# Patient Record
Sex: Female | Born: 1935 | Race: White | Hispanic: No | Marital: Married
Health system: Midwestern US, Community
[De-identification: ages and names within clinical notes are randomized; demographics above are authoritative.]

## PROBLEM LIST (undated history)

## (undated) DIAGNOSIS — Z87442 Personal history of urinary calculi: Secondary | ICD-10-CM

## (undated) DIAGNOSIS — R911 Solitary pulmonary nodule: Secondary | ICD-10-CM

## (undated) DIAGNOSIS — I5022 Chronic systolic (congestive) heart failure: Secondary | ICD-10-CM

## (undated) DIAGNOSIS — M8580 Other specified disorders of bone density and structure, unspecified site: Secondary | ICD-10-CM

## (undated) DIAGNOSIS — R0600 Dyspnea, unspecified: Secondary | ICD-10-CM

## (undated) DIAGNOSIS — N210 Calculus in bladder: Secondary | ICD-10-CM

## (undated) DIAGNOSIS — I428 Other cardiomyopathies: Secondary | ICD-10-CM

## (undated) DIAGNOSIS — C679 Malignant neoplasm of bladder, unspecified: Secondary | ICD-10-CM

## (undated) DIAGNOSIS — E785 Hyperlipidemia, unspecified: Secondary | ICD-10-CM

## (undated) DIAGNOSIS — K5730 Diverticulosis of large intestine without perforation or abscess without bleeding: Secondary | ICD-10-CM

## (undated) DIAGNOSIS — R351 Nocturia: Secondary | ICD-10-CM

## (undated) DIAGNOSIS — K219 Gastro-esophageal reflux disease without esophagitis: Secondary | ICD-10-CM

## (undated) DIAGNOSIS — J189 Pneumonia, unspecified organism: Secondary | ICD-10-CM

## (undated) DIAGNOSIS — R0609 Other forms of dyspnea: Secondary | ICD-10-CM

## (undated) DIAGNOSIS — I491 Atrial premature depolarization: Secondary | ICD-10-CM

## (undated) DIAGNOSIS — M1990 Unspecified osteoarthritis, unspecified site: Secondary | ICD-10-CM

## (undated) DIAGNOSIS — M542 Cervicalgia: Secondary | ICD-10-CM

## (undated) DIAGNOSIS — I447 Left bundle-branch block, unspecified: Secondary | ICD-10-CM

## (undated) DIAGNOSIS — H9193 Unspecified hearing loss, bilateral: Secondary | ICD-10-CM

## (undated) DIAGNOSIS — M791 Myalgia, unspecified site: Secondary | ICD-10-CM

## (undated) DIAGNOSIS — Z974 Presence of external hearing-aid: Secondary | ICD-10-CM

## (undated) HISTORY — PX: COSMETIC SURGERY: SHX468

## (undated) HISTORY — PX: ORIF FINGER / THUMB FRACTURE: SUR932

## (undated) HISTORY — PX: TRANSTHORACIC ECHOCARDIOGRAM: SHX275

## (undated) HISTORY — PX: TONSILLECTOMY: SUR1361

## (undated) HISTORY — PX: CARDIAC CATHETERIZATION: SHX172

## (undated) HISTORY — DX: Personal history of urinary calculi: Z87.442

## (undated) HISTORY — PX: TUBAL LIGATION: SHX77

## (undated) HISTORY — DX: Diverticulosis of large intestine without perforation or abscess without bleeding: K57.30

## (undated) HISTORY — DX: Other specified disorders of bone density and structure, unspecified site: M85.80

## (undated) HISTORY — DX: Hyperlipidemia, unspecified: E78.5

## (undated) HISTORY — DX: Chronic systolic (congestive) heart failure: I50.22

---

## 1996-04-28 HISTORY — PX: CATARACT EXTRACTION W/ INTRAOCULAR LENS  IMPLANT, BILATERAL: SHX1307

## 1996-09-22 ENCOUNTER — Encounter: Payer: Self-pay | Admitting: Gastroenterology

## 1997-08-09 ENCOUNTER — Other Ambulatory Visit: Admission: RE | Admit: 1997-08-09 | Discharge: 1997-08-09 | Payer: Self-pay | Admitting: Obstetrics and Gynecology

## 1997-09-01 ENCOUNTER — Ambulatory Visit (HOSPITAL_COMMUNITY): Admission: RE | Admit: 1997-09-01 | Discharge: 1997-09-01 | Payer: Self-pay | Admitting: Obstetrics and Gynecology

## 1998-03-13 ENCOUNTER — Inpatient Hospital Stay (HOSPITAL_COMMUNITY): Admission: EM | Admit: 1998-03-13 | Discharge: 1998-03-15 | Payer: Self-pay | Admitting: Internal Medicine

## 1998-03-14 ENCOUNTER — Encounter: Payer: Self-pay | Admitting: Internal Medicine

## 1998-08-10 ENCOUNTER — Other Ambulatory Visit: Admission: RE | Admit: 1998-08-10 | Discharge: 1998-08-10 | Payer: Self-pay | Admitting: Obstetrics and Gynecology

## 1999-02-05 ENCOUNTER — Ambulatory Visit (HOSPITAL_COMMUNITY): Admission: RE | Admit: 1999-02-05 | Discharge: 1999-02-05 | Payer: Self-pay | Admitting: Internal Medicine

## 1999-02-05 ENCOUNTER — Encounter: Payer: Self-pay | Admitting: Internal Medicine

## 1999-02-21 ENCOUNTER — Encounter: Payer: Self-pay | Admitting: Internal Medicine

## 1999-02-21 ENCOUNTER — Ambulatory Visit (HOSPITAL_COMMUNITY): Admission: RE | Admit: 1999-02-21 | Discharge: 1999-02-21 | Payer: Self-pay | Admitting: *Deleted

## 1999-05-17 ENCOUNTER — Ambulatory Visit (HOSPITAL_COMMUNITY): Admission: RE | Admit: 1999-05-17 | Discharge: 1999-05-17 | Payer: Self-pay | Admitting: Internal Medicine

## 1999-05-17 ENCOUNTER — Encounter: Payer: Self-pay | Admitting: Internal Medicine

## 1999-08-22 ENCOUNTER — Ambulatory Visit (HOSPITAL_COMMUNITY): Admission: RE | Admit: 1999-08-22 | Discharge: 1999-08-22 | Payer: Self-pay | Admitting: Internal Medicine

## 1999-08-22 ENCOUNTER — Encounter: Payer: Self-pay | Admitting: Internal Medicine

## 1999-09-04 ENCOUNTER — Other Ambulatory Visit: Admission: RE | Admit: 1999-09-04 | Discharge: 1999-09-04 | Payer: Self-pay | Admitting: Obstetrics and Gynecology

## 2000-12-30 ENCOUNTER — Other Ambulatory Visit: Admission: RE | Admit: 2000-12-30 | Discharge: 2000-12-30 | Payer: Self-pay | Admitting: Obstetrics and Gynecology

## 2001-04-28 HISTORY — PX: EXTRACORPOREAL SHOCK WAVE LITHOTRIPSY: SHX1557

## 2001-10-13 ENCOUNTER — Encounter: Payer: Self-pay | Admitting: Internal Medicine

## 2001-10-13 ENCOUNTER — Encounter: Admission: RE | Admit: 2001-10-13 | Discharge: 2001-10-13 | Payer: Self-pay | Admitting: Internal Medicine

## 2001-10-15 ENCOUNTER — Encounter: Payer: Self-pay | Admitting: *Deleted

## 2001-10-15 ENCOUNTER — Encounter: Admission: RE | Admit: 2001-10-15 | Discharge: 2001-10-15 | Payer: Self-pay | Admitting: *Deleted

## 2001-10-16 ENCOUNTER — Ambulatory Visit (HOSPITAL_COMMUNITY): Admission: RE | Admit: 2001-10-16 | Discharge: 2001-10-16 | Payer: Self-pay | Admitting: *Deleted

## 2001-10-16 ENCOUNTER — Encounter: Payer: Self-pay | Admitting: *Deleted

## 2001-10-16 HISTORY — PX: OTHER SURGICAL HISTORY: SHX169

## 2001-10-18 ENCOUNTER — Encounter: Payer: Self-pay | Admitting: *Deleted

## 2001-10-18 ENCOUNTER — Ambulatory Visit (HOSPITAL_BASED_OUTPATIENT_CLINIC_OR_DEPARTMENT_OTHER): Admission: RE | Admit: 2001-10-18 | Discharge: 2001-10-18 | Payer: Self-pay | Admitting: *Deleted

## 2002-02-09 ENCOUNTER — Other Ambulatory Visit: Admission: RE | Admit: 2002-02-09 | Discharge: 2002-02-09 | Payer: Self-pay | Admitting: Obstetrics and Gynecology

## 2003-03-02 ENCOUNTER — Other Ambulatory Visit: Admission: RE | Admit: 2003-03-02 | Discharge: 2003-03-02 | Payer: Self-pay | Admitting: Obstetrics and Gynecology

## 2004-04-17 ENCOUNTER — Ambulatory Visit: Payer: Self-pay | Admitting: Internal Medicine

## 2004-04-26 ENCOUNTER — Encounter: Admission: RE | Admit: 2004-04-26 | Discharge: 2004-04-26 | Payer: Self-pay | Admitting: Internal Medicine

## 2004-05-31 ENCOUNTER — Other Ambulatory Visit: Admission: RE | Admit: 2004-05-31 | Discharge: 2004-05-31 | Payer: Self-pay | Admitting: Obstetrics and Gynecology

## 2004-07-22 ENCOUNTER — Ambulatory Visit: Payer: Self-pay | Admitting: Internal Medicine

## 2004-07-29 ENCOUNTER — Ambulatory Visit: Payer: Self-pay | Admitting: Internal Medicine

## 2004-10-04 ENCOUNTER — Ambulatory Visit: Payer: Self-pay | Admitting: Internal Medicine

## 2004-12-17 ENCOUNTER — Ambulatory Visit: Payer: Self-pay | Admitting: Internal Medicine

## 2004-12-23 ENCOUNTER — Ambulatory Visit: Payer: Self-pay

## 2004-12-27 ENCOUNTER — Ambulatory Visit: Payer: Self-pay | Admitting: Internal Medicine

## 2005-02-17 ENCOUNTER — Ambulatory Visit (HOSPITAL_COMMUNITY): Admission: RE | Admit: 2005-02-17 | Discharge: 2005-02-17 | Payer: Self-pay | Admitting: Orthopedic Surgery

## 2005-02-17 HISTORY — PX: OTHER SURGICAL HISTORY: SHX169

## 2005-04-08 ENCOUNTER — Ambulatory Visit: Payer: Self-pay | Admitting: Internal Medicine

## 2005-07-03 ENCOUNTER — Other Ambulatory Visit: Admission: RE | Admit: 2005-07-03 | Discharge: 2005-07-03 | Payer: Self-pay | Admitting: Obstetrics and Gynecology

## 2005-10-02 ENCOUNTER — Ambulatory Visit: Payer: Self-pay | Admitting: Internal Medicine

## 2005-10-30 ENCOUNTER — Ambulatory Visit: Payer: Self-pay | Admitting: Internal Medicine

## 2006-10-02 ENCOUNTER — Ambulatory Visit: Payer: Self-pay | Admitting: Internal Medicine

## 2006-10-02 ENCOUNTER — Encounter: Admission: RE | Admit: 2006-10-02 | Discharge: 2006-10-02 | Payer: Self-pay | Admitting: Internal Medicine

## 2006-10-02 DIAGNOSIS — E785 Hyperlipidemia, unspecified: Secondary | ICD-10-CM | POA: Insufficient documentation

## 2006-10-02 DIAGNOSIS — J449 Chronic obstructive pulmonary disease, unspecified: Secondary | ICD-10-CM

## 2006-10-03 ENCOUNTER — Emergency Department (HOSPITAL_COMMUNITY): Admission: EM | Admit: 2006-10-03 | Discharge: 2006-10-04 | Payer: Self-pay | Admitting: Emergency Medicine

## 2006-10-05 ENCOUNTER — Encounter (HOSPITAL_COMMUNITY): Admission: RE | Admit: 2006-10-05 | Discharge: 2006-12-07 | Payer: Self-pay | Admitting: Obstetrics and Gynecology

## 2006-10-06 ENCOUNTER — Telehealth: Payer: Self-pay | Admitting: Internal Medicine

## 2006-10-13 ENCOUNTER — Ambulatory Visit: Payer: Self-pay | Admitting: Internal Medicine

## 2006-10-15 ENCOUNTER — Ambulatory Visit: Payer: Self-pay | Admitting: Internal Medicine

## 2006-10-16 ENCOUNTER — Encounter (INDEPENDENT_AMBULATORY_CARE_PROVIDER_SITE_OTHER): Payer: Self-pay | Admitting: *Deleted

## 2006-10-16 LAB — CONVERTED CEMR LAB
ALT: 28 units/L (ref 0–40)
AST: 36 units/L (ref 0–37)
Albumin: 3.8 g/dL (ref 3.5–5.2)
Alkaline Phosphatase: 73 units/L (ref 39–117)
BUN: 17 mg/dL (ref 6–23)
Basophils Absolute: 0 10*3/uL (ref 0.0–0.1)
Basophils Relative: 0.2 % (ref 0.0–1.0)
Bilirubin, Direct: 0.1 mg/dL (ref 0.0–0.3)
CO2: 32 meq/L (ref 19–32)
Calcium: 9.5 mg/dL (ref 8.4–10.5)
Chloride: 106 meq/L (ref 96–112)
Cholesterol: 191 mg/dL (ref 0–200)
Creatinine, Ser: 0.8 mg/dL (ref 0.4–1.2)
Eosinophils Absolute: 0.1 10*3/uL (ref 0.0–0.6)
Eosinophils Relative: 1 % (ref 0.0–5.0)
Free T4: 0.9 ng/dL (ref 0.6–1.6)
GFR calc Af Amer: 91 mL/min
GFR calc non Af Amer: 75 mL/min
Glucose, Bld: 88 mg/dL (ref 70–99)
HCT: 42 % (ref 36.0–46.0)
HDL: 45.8 mg/dL (ref >39.0)
Hemoglobin: 14.1 g/dL (ref 12.0–15.0)
LDL Cholesterol: 110 mg/dL — ABNORMAL HIGH (ref 0–99)
Lymphocytes Relative: 29.8 % (ref 12.0–46.0)
MCHC: 33.6 g/dL (ref 30.0–36.0)
MCV: 94 fL (ref 78.0–100.0)
Monocytes Absolute: 0.6 10*3/uL (ref 0.2–0.7)
Monocytes Relative: 7.3 % (ref 3.0–11.0)
Neutro Abs: 4.8 10*3/uL (ref 1.4–7.7)
Neutrophils Relative %: 61.7 % (ref 43.0–77.0)
Platelets: 416 10*3/uL — ABNORMAL HIGH (ref 150–400)
Potassium: 4.9 meq/L (ref 3.5–5.1)
RBC: 4.47 M/uL (ref 3.87–5.11)
RDW: 12.4 % (ref 11.5–14.6)
Sodium: 142 meq/L (ref 135–145)
T3, Free: 2.7 pg/mL (ref 2.3–4.2)
TSH: 1.03 microintl units/mL (ref 0.35–5.50)
Total Bilirubin: 0.4 mg/dL (ref 0.3–1.2)
Total CHOL/HDL Ratio: 4.2
Total Protein: 7.1 g/dL (ref 6.0–8.3)
Triglycerides: 174 mg/dL — ABNORMAL HIGH (ref 0–149)
VLDL: 35 mg/dL (ref 0–40)
WBC: 7.9 10*3/uL (ref 4.5–10.5)

## 2006-10-22 ENCOUNTER — Encounter (INDEPENDENT_AMBULATORY_CARE_PROVIDER_SITE_OTHER): Payer: Self-pay | Admitting: *Deleted

## 2006-11-09 ENCOUNTER — Ambulatory Visit: Payer: Self-pay | Admitting: Gastroenterology

## 2006-12-25 ENCOUNTER — Ambulatory Visit: Payer: Self-pay | Admitting: Gastroenterology

## 2006-12-25 ENCOUNTER — Encounter: Payer: Self-pay | Admitting: Internal Medicine

## 2006-12-25 DIAGNOSIS — K5730 Diverticulosis of large intestine without perforation or abscess without bleeding: Secondary | ICD-10-CM | POA: Insufficient documentation

## 2007-01-05 ENCOUNTER — Encounter (HOSPITAL_COMMUNITY): Admission: RE | Admit: 2007-01-05 | Discharge: 2007-03-05 | Payer: Self-pay | Admitting: Obstetrics and Gynecology

## 2007-03-01 ENCOUNTER — Ambulatory Visit: Payer: Self-pay | Admitting: Internal Medicine

## 2007-03-09 ENCOUNTER — Encounter (INDEPENDENT_AMBULATORY_CARE_PROVIDER_SITE_OTHER): Payer: Self-pay | Admitting: *Deleted

## 2007-03-09 LAB — CONVERTED CEMR LAB: Vit D, 1,25-Dihydroxy: 55 (ref 30–89)

## 2007-04-06 ENCOUNTER — Encounter (HOSPITAL_COMMUNITY): Admission: RE | Admit: 2007-04-06 | Discharge: 2007-05-06 | Payer: Self-pay | Admitting: Obstetrics and Gynecology

## 2007-07-05 ENCOUNTER — Encounter (HOSPITAL_COMMUNITY): Admission: RE | Admit: 2007-07-05 | Discharge: 2007-07-05 | Payer: Self-pay | Admitting: Obstetrics and Gynecology

## 2007-07-15 ENCOUNTER — Telehealth (INDEPENDENT_AMBULATORY_CARE_PROVIDER_SITE_OTHER): Payer: Self-pay | Admitting: *Deleted

## 2007-08-16 DIAGNOSIS — N200 Calculus of kidney: Secondary | ICD-10-CM

## 2007-10-05 ENCOUNTER — Ambulatory Visit (HOSPITAL_COMMUNITY): Admission: RE | Admit: 2007-10-05 | Discharge: 2007-10-05 | Payer: Self-pay | Admitting: Obstetrics and Gynecology

## 2007-10-20 ENCOUNTER — Telehealth: Payer: Self-pay | Admitting: Internal Medicine

## 2007-10-21 ENCOUNTER — Encounter: Payer: Self-pay | Admitting: Internal Medicine

## 2007-11-01 ENCOUNTER — Telehealth (INDEPENDENT_AMBULATORY_CARE_PROVIDER_SITE_OTHER): Payer: Self-pay | Admitting: *Deleted

## 2007-11-05 ENCOUNTER — Encounter: Admission: RE | Admit: 2007-11-05 | Discharge: 2007-11-05 | Payer: Self-pay | Admitting: Obstetrics and Gynecology

## 2007-11-05 ENCOUNTER — Ambulatory Visit: Payer: Self-pay | Admitting: Internal Medicine

## 2007-11-05 LAB — CONVERTED CEMR LAB
ALT: 34 units/L (ref 0–35)
AST: 37 units/L (ref 0–37)
Albumin: 3.8 g/dL (ref 3.5–5.2)
Alkaline Phosphatase: 72 units/L (ref 39–117)
BUN: 12 mg/dL (ref 6–23)
Basophils Absolute: 0.1 10*3/uL (ref 0.0–0.1)
Basophils Relative: 1 % (ref 0.0–1.0)
Bilirubin, Direct: 0.1 mg/dL (ref 0.0–0.3)
CO2: 31 meq/L (ref 19–32)
Calcium: 9.7 mg/dL (ref 8.4–10.5)
Chloride: 105 meq/L (ref 96–112)
Cholesterol: 155 mg/dL (ref 0–200)
Creatinine, Ser: 0.8 mg/dL (ref 0.4–1.2)
Eosinophils Absolute: 0.1 10*3/uL (ref 0.0–0.7)
Eosinophils Relative: 1.4 % (ref 0.0–5.0)
GFR calc Af Amer: 91 mL/min
GFR calc non Af Amer: 75 mL/min
Glucose, Bld: 92 mg/dL (ref 70–99)
HCT: 40.9 % (ref 36.0–46.0)
HDL: 60.6 mg/dL (ref >39.0)
Hemoglobin: 14.2 g/dL (ref 12.0–15.0)
LDL Cholesterol: 75 mg/dL (ref 0–99)
Lymphocytes Relative: 33.6 % (ref 12.0–46.0)
MCHC: 34.7 g/dL (ref 30.0–36.0)
MCV: 98.1 fL (ref 78.0–100.0)
Monocytes Absolute: 0.6 10*3/uL (ref 0.1–1.0)
Monocytes Relative: 8.5 % (ref 3.0–12.0)
Neutro Abs: 3.9 10*3/uL (ref 1.4–7.7)
Neutrophils Relative %: 55.5 % (ref 43.0–77.0)
Platelets: 243 10*3/uL (ref 150–400)
Potassium: 4.7 meq/L (ref 3.5–5.1)
RBC: 4.17 M/uL (ref 3.87–5.11)
RDW: 12.3 % (ref 11.5–14.6)
Sodium: 142 meq/L (ref 135–145)
TSH: 0.86 microintl units/mL (ref 0.35–5.50)
Total Bilirubin: 0.9 mg/dL (ref 0.3–1.2)
Total CHOL/HDL Ratio: 2.6
Total Protein: 6.9 g/dL (ref 6.0–8.3)
Triglycerides: 99 mg/dL (ref 0–149)
VLDL: 20 mg/dL (ref 0–40)
Vit D, 1,25-Dihydroxy: 62 (ref 30–89)
WBC: 7.1 10*3/uL (ref 4.5–10.5)

## 2007-11-09 ENCOUNTER — Encounter: Payer: Self-pay | Admitting: Internal Medicine

## 2007-11-12 ENCOUNTER — Ambulatory Visit: Payer: Self-pay | Admitting: Internal Medicine

## 2007-11-12 DIAGNOSIS — M949 Disorder of cartilage, unspecified: Secondary | ICD-10-CM

## 2007-11-12 DIAGNOSIS — M899 Disorder of bone, unspecified: Secondary | ICD-10-CM | POA: Insufficient documentation

## 2007-11-12 DIAGNOSIS — Z87442 Personal history of urinary calculi: Secondary | ICD-10-CM

## 2007-11-15 ENCOUNTER — Encounter (INDEPENDENT_AMBULATORY_CARE_PROVIDER_SITE_OTHER): Payer: Self-pay | Admitting: *Deleted

## 2007-11-16 ENCOUNTER — Encounter (INDEPENDENT_AMBULATORY_CARE_PROVIDER_SITE_OTHER): Payer: Self-pay | Admitting: *Deleted

## 2007-11-18 ENCOUNTER — Ambulatory Visit: Payer: Self-pay | Admitting: Internal Medicine

## 2007-11-30 ENCOUNTER — Encounter (INDEPENDENT_AMBULATORY_CARE_PROVIDER_SITE_OTHER): Payer: Self-pay | Admitting: *Deleted

## 2007-11-30 ENCOUNTER — Ambulatory Visit: Payer: Self-pay | Admitting: Internal Medicine

## 2007-11-30 LAB — CONVERTED CEMR LAB
OCCULT 1: NEGATIVE
OCCULT 2: NEGATIVE
OCCULT 3: NEGATIVE

## 2007-12-21 ENCOUNTER — Ambulatory Visit: Payer: Self-pay | Admitting: Gastroenterology

## 2007-12-21 DIAGNOSIS — R1013 Epigastric pain: Secondary | ICD-10-CM

## 2007-12-21 DIAGNOSIS — K3189 Other diseases of stomach and duodenum: Secondary | ICD-10-CM

## 2007-12-29 ENCOUNTER — Encounter: Payer: Self-pay | Admitting: Internal Medicine

## 2008-01-10 ENCOUNTER — Ambulatory Visit: Payer: Self-pay | Admitting: Gastroenterology

## 2008-01-31 ENCOUNTER — Encounter: Payer: Self-pay | Admitting: Internal Medicine

## 2008-01-31 ENCOUNTER — Ambulatory Visit: Payer: Self-pay | Admitting: Gastroenterology

## 2008-01-31 ENCOUNTER — Encounter: Payer: Self-pay | Admitting: Gastroenterology

## 2008-02-02 ENCOUNTER — Encounter: Payer: Self-pay | Admitting: Gastroenterology

## 2008-02-04 ENCOUNTER — Ambulatory Visit: Payer: Self-pay | Admitting: Cardiology

## 2008-02-07 ENCOUNTER — Encounter: Payer: Self-pay | Admitting: Gastroenterology

## 2008-02-14 ENCOUNTER — Ambulatory Visit: Payer: Self-pay | Admitting: Internal Medicine

## 2008-02-14 DIAGNOSIS — T887XXA Unspecified adverse effect of drug or medicament, initial encounter: Secondary | ICD-10-CM | POA: Insufficient documentation

## 2008-02-14 DIAGNOSIS — K2940 Chronic atrophic gastritis without bleeding: Secondary | ICD-10-CM | POA: Insufficient documentation

## 2008-02-14 DIAGNOSIS — I447 Left bundle-branch block, unspecified: Secondary | ICD-10-CM | POA: Insufficient documentation

## 2008-02-23 ENCOUNTER — Ambulatory Visit: Payer: Self-pay | Admitting: Gastroenterology

## 2008-03-15 ENCOUNTER — Ambulatory Visit: Payer: Self-pay | Admitting: Gastroenterology

## 2008-03-15 DIAGNOSIS — R29818 Other symptoms and signs involving the nervous system: Secondary | ICD-10-CM | POA: Insufficient documentation

## 2008-05-09 ENCOUNTER — Encounter (INDEPENDENT_AMBULATORY_CARE_PROVIDER_SITE_OTHER): Payer: Self-pay | Admitting: *Deleted

## 2008-11-20 ENCOUNTER — Ambulatory Visit: Payer: Self-pay | Admitting: Internal Medicine

## 2008-11-20 DIAGNOSIS — M2550 Pain in unspecified joint: Secondary | ICD-10-CM | POA: Insufficient documentation

## 2008-11-20 DIAGNOSIS — IMO0001 Reserved for inherently not codable concepts without codable children: Secondary | ICD-10-CM

## 2008-11-20 DIAGNOSIS — E559 Vitamin D deficiency, unspecified: Secondary | ICD-10-CM

## 2008-11-28 ENCOUNTER — Telehealth (INDEPENDENT_AMBULATORY_CARE_PROVIDER_SITE_OTHER): Payer: Self-pay | Admitting: *Deleted

## 2008-11-30 ENCOUNTER — Encounter (INDEPENDENT_AMBULATORY_CARE_PROVIDER_SITE_OTHER): Payer: Self-pay | Admitting: *Deleted

## 2009-06-19 ENCOUNTER — Ambulatory Visit: Payer: Self-pay | Admitting: Internal Medicine

## 2009-06-19 ENCOUNTER — Encounter: Payer: Self-pay | Admitting: Internal Medicine

## 2009-06-29 ENCOUNTER — Ambulatory Visit: Payer: Self-pay | Admitting: Internal Medicine

## 2009-06-29 DIAGNOSIS — R5383 Other fatigue: Secondary | ICD-10-CM | POA: Insufficient documentation

## 2009-07-02 ENCOUNTER — Encounter: Payer: Self-pay | Admitting: Internal Medicine

## 2009-07-02 ENCOUNTER — Telehealth (INDEPENDENT_AMBULATORY_CARE_PROVIDER_SITE_OTHER): Payer: Self-pay | Admitting: *Deleted

## 2009-07-02 DIAGNOSIS — D72829 Elevated white blood cell count, unspecified: Secondary | ICD-10-CM | POA: Insufficient documentation

## 2009-07-02 LAB — CONVERTED CEMR LAB
ALT: 27 units/L (ref 0–35)
AST: 24 units/L (ref 0–37)
BUN: 28 mg/dL — ABNORMAL HIGH (ref 6–23)
Basophils Absolute: 0 10*3/uL (ref 0.0–0.1)
Basophils Relative: 0 % (ref 0–1)
Creatinine, Ser: 0.88 mg/dL (ref 0.40–1.20)
Eosinophils Absolute: 0.1 10*3/uL (ref 0.0–0.7)
Eosinophils Relative: 0 % (ref 0–5)
HCT: 44.4 % (ref 36.0–46.0)
Hemoglobin: 14.6 g/dL (ref 12.0–15.0)
Hgb A1c MFr Bld: 6.5 % — ABNORMAL HIGH (ref 4.6–6.1)
Lymphocytes Relative: 15 % (ref 12–46)
Lymphs Abs: 3.5 10*3/uL (ref 0.7–4.0)
MCHC: 32.9 g/dL (ref 30.0–36.0)
MCV: 93.9 fL (ref 78.0–100.0)
Monocytes Absolute: 1.3 10*3/uL — ABNORMAL HIGH (ref 0.1–1.0)
Monocytes Relative: 5 % (ref 3–12)
Neutro Abs: 18.7 10*3/uL — ABNORMAL HIGH (ref 1.7–7.7)
Neutrophils Relative %: 80 % — ABNORMAL HIGH (ref 43–77)
Platelets: 396 10*3/uL (ref 150–400)
Potassium: 5.2 meq/L (ref 3.5–5.3)
RBC: 4.73 M/uL (ref 3.87–5.11)
RDW: 13.5 % (ref 11.5–15.5)
TSH: 0.96 microintl units/mL (ref 0.350–4.500)
WBC: 23.5 10*3/uL — ABNORMAL HIGH (ref 4.0–10.5)

## 2009-07-03 ENCOUNTER — Ambulatory Visit: Payer: Self-pay | Admitting: Internal Medicine

## 2009-07-04 LAB — CONVERTED CEMR LAB
Basophils Absolute: 0.1 10*3/uL (ref 0.0–0.1)
Basophils Relative: 0.5 % (ref 0.0–3.0)
Eosinophils Absolute: 0.1 10*3/uL (ref 0.0–0.7)
Eosinophils Relative: 0.7 % (ref 0.0–5.0)
HCT: 39 % (ref 36.0–46.0)
Hemoglobin: 12.9 g/dL (ref 12.0–15.0)
Lymphocytes Relative: 19.2 % (ref 12.0–46.0)
Lymphs Abs: 2.5 10*3/uL (ref 0.7–4.0)
MCHC: 33.1 g/dL (ref 30.0–36.0)
MCV: 95.5 fL (ref 78.0–100.0)
Monocytes Absolute: 1 10*3/uL (ref 0.1–1.0)
Monocytes Relative: 8 % (ref 3.0–12.0)
Neutro Abs: 9.4 10*3/uL — ABNORMAL HIGH (ref 1.4–7.7)
Neutrophils Relative %: 71.6 % (ref 43.0–77.0)
Platelets: 286 10*3/uL (ref 150.0–400.0)
RBC: 4.09 M/uL (ref 3.87–5.11)
RDW: 12.6 % (ref 11.5–14.6)
WBC: 13.1 10*3/uL — ABNORMAL HIGH (ref 4.5–10.5)

## 2009-07-05 ENCOUNTER — Telehealth: Payer: Self-pay | Admitting: Internal Medicine

## 2009-07-16 ENCOUNTER — Telehealth (INDEPENDENT_AMBULATORY_CARE_PROVIDER_SITE_OTHER): Payer: Self-pay | Admitting: *Deleted

## 2009-07-17 ENCOUNTER — Ambulatory Visit: Payer: Self-pay | Admitting: Internal Medicine

## 2009-07-17 LAB — CONVERTED CEMR LAB
ALT: 15 units/L (ref 0–35)
AST: 23 units/L (ref 0–37)
Albumin: 3.3 g/dL — ABNORMAL LOW (ref 3.5–5.2)
Alkaline Phosphatase: 78 units/L (ref 39–117)
BUN: 13 mg/dL (ref 6–23)
Basophils Absolute: 0 10*3/uL (ref 0.0–0.1)
Basophils Relative: 0.4 % (ref 0.0–3.0)
Bilirubin, Direct: 0.1 mg/dL (ref 0.0–0.3)
CO2: 33 meq/L — ABNORMAL HIGH (ref 19–32)
Calcium: 9.6 mg/dL (ref 8.4–10.5)
Chloride: 105 meq/L (ref 96–112)
Creatinine, Ser: 0.8 mg/dL (ref 0.4–1.2)
Eosinophils Absolute: 0.2 10*3/uL (ref 0.0–0.7)
Eosinophils Relative: 2.4 % (ref 0.0–5.0)
Free T4: 1.7 ng/dL — ABNORMAL HIGH (ref 0.6–1.6)
GFR calc non Af Amer: 74.42 mL/min (ref >60)
Glucose, Bld: 82 mg/dL (ref 70–99)
HCT: 37.1 % (ref 36.0–46.0)
Hemoglobin: 12.2 g/dL (ref 12.0–15.0)
Lymphocytes Relative: 29.6 % (ref 12.0–46.0)
Lymphs Abs: 2.2 10*3/uL (ref 0.7–4.0)
MCHC: 32.8 g/dL (ref 30.0–36.0)
MCV: 95.5 fL (ref 78.0–100.0)
Monocytes Absolute: 0.7 10*3/uL (ref 0.1–1.0)
Monocytes Relative: 9.2 % (ref 3.0–12.0)
Neutro Abs: 4.3 10*3/uL (ref 1.4–7.7)
Neutrophils Relative %: 58.4 % (ref 43.0–77.0)
Platelets: 404 10*3/uL — ABNORMAL HIGH (ref 150.0–400.0)
Potassium: 4.9 meq/L (ref 3.5–5.1)
RBC: 3.89 M/uL (ref 3.87–5.11)
RDW: 12.8 % (ref 11.5–14.6)
Sodium: 143 meq/L (ref 135–145)
TSH: 1.41 microintl units/mL (ref 0.35–5.50)
Total Bilirubin: 0.3 mg/dL (ref 0.3–1.2)
Total Protein: 7.2 g/dL (ref 6.0–8.3)
WBC: 7.4 10*3/uL (ref 4.5–10.5)

## 2009-07-19 ENCOUNTER — Ambulatory Visit: Payer: Self-pay | Admitting: Internal Medicine

## 2009-07-19 DIAGNOSIS — R946 Abnormal results of thyroid function studies: Secondary | ICD-10-CM

## 2009-07-31 ENCOUNTER — Ambulatory Visit: Payer: Self-pay | Admitting: Internal Medicine

## 2009-07-31 HISTORY — PX: OTHER SURGICAL HISTORY: SHX169

## 2009-08-01 ENCOUNTER — Encounter: Payer: Self-pay | Admitting: Internal Medicine

## 2009-08-08 ENCOUNTER — Telehealth: Payer: Self-pay | Admitting: Internal Medicine

## 2009-10-11 ENCOUNTER — Ambulatory Visit: Payer: Self-pay | Admitting: Internal Medicine

## 2009-10-11 LAB — CONVERTED CEMR LAB
Free T4: 3.27 ng/dL — ABNORMAL HIGH (ref 0.60–1.60)
Hgb A1c MFr Bld: 5.6 % (ref 4.6–6.5)
T3, Free: 3.1 pg/mL (ref 2.3–4.2)
TSH: 1.08 microintl units/mL (ref 0.35–5.50)

## 2009-10-17 ENCOUNTER — Ambulatory Visit: Payer: Self-pay | Admitting: Internal Medicine

## 2009-10-17 DIAGNOSIS — M545 Low back pain, unspecified: Secondary | ICD-10-CM | POA: Insufficient documentation

## 2009-10-22 LAB — CONVERTED CEMR LAB
ALT: 19 units/L (ref 0–35)
AST: 30 units/L (ref 0–37)
Cholesterol: 211 mg/dL — ABNORMAL HIGH (ref 0–200)
Direct LDL: 122.8 mg/dL
HDL: 76.2 mg/dL (ref >39.00)
Total CHOL/HDL Ratio: 3
Triglycerides: 115 mg/dL (ref 0.0–149.0)
VLDL: 23 mg/dL (ref 0.0–40.0)

## 2010-02-14 ENCOUNTER — Ambulatory Visit: Payer: Self-pay | Admitting: Internal Medicine

## 2010-02-18 LAB — CONVERTED CEMR LAB
Free T4: 3.33 ng/dL — ABNORMAL HIGH (ref 0.60–1.60)
T3, Free: 3.5 pg/mL (ref 2.3–4.2)
TSH: 1.08 microintl units/mL (ref 0.35–5.50)

## 2010-02-26 ENCOUNTER — Ambulatory Visit: Payer: Self-pay | Admitting: Internal Medicine

## 2010-03-01 ENCOUNTER — Encounter: Admission: RE | Admit: 2010-03-01 | Discharge: 2010-03-01 | Payer: Self-pay | Admitting: Internal Medicine

## 2010-05-26 LAB — CONVERTED CEMR LAB
ALT: 17 units/L (ref 0–35)
AST: 30 units/L (ref 0–37)
Albumin: 4 g/dL (ref 3.5–5.2)
Alkaline Phosphatase: 78 units/L (ref 39–117)
Bilirubin, Direct: 0.1 mg/dL (ref 0.0–0.3)
Cholesterol, target level: 200 mg/dL
HDL goal, serum: 40 mg/dL
LDL Goal: 130 mg/dL
Sed Rate: 17 mm/hr (ref 0–22)
TSH: 0.85 microintl units/mL (ref 0.35–5.50)
Total Bilirubin: 1.3 mg/dL — ABNORMAL HIGH (ref 0.3–1.2)
Total CK: 70 units/L (ref 7–177)
Total Protein: 7.3 g/dL (ref 6.0–8.3)
Uric Acid, Serum: 5.4 mg/dL (ref 2.4–7.0)
Vit D, 25-Hydroxy: 46 ng/mL (ref 30–89)

## 2010-05-28 NOTE — Assessment & Plan Note (Signed)
Summary: cough,fever x1 week//lch   Vital Signs:  Patient profile:   77 year old female Weight:      96.6 pounds Temp:     99.4 degrees F oral Pulse rate:   92 / minute Resp:     17 per minute BP sitting:   120 / 66  (left arm) Cuff size:   regular  Vitals Entered By: Shonna Chock (June 19, 2009 10:44 AM) CC: Cough, fever x 1 week Comments REVIEWED MED LIST, PATIENT AGREED DOSE AND INSTRUCTION CORRECT    Primary Care Provider:  Elmore Guise, MD  CC:  Cough and fever x 1 week.  History of Present Illness: Onset as malaise & NP cough 06/10/2009. At [facility] 02/14 temp of 103. Over past 8 days intermittent fever & NP cough, SOB. Rx: Tylenol.She quit smoking in 2010. PMH of COAD.  Allergies: 1)  ! Pcn 2)  ! Cardizem 3)  ! Codeine 4)  ! * Calcium Channel Blockers 5)  ! Beta Blockers 6)  ! Lescol 7)  ! Zocor 8)  ! Barbiturates 9)  ! * Crestor 10)  ! Celebrex 11)  ! Boniva (Ibandronate Sodium) 12)  ! * Shrimp 13)  Asa  Review of Systems General:  Complains of chills and fever; denies sweats. ENT:  Denies nasal congestion and sinus pressure; No frontal headache, facial pain or purulence. CV:  Denies difficulty breathing at night and difficulty breathing while lying down. Resp:  Complains of wheezing; denies chest pain with inspiration, coughing up blood, and sputum productive.  Physical Exam  General:  Thin,in no acute distress;  appears fatigued ,cooperative throughout examination Ears:  Aids bilaterally Nose:  External nasal examination shows no deformity or inflammation. Nasal mucosa are pink and moist without lesions or exudates. Mouth:  Oral mucosa and oropharynx without lesions or exudates.   Mild pharyngeal erythema.   Lungs:  Quiet lungs; minimal rales RLL Heart:  S4 with slurring , heart sounds distant Cervical Nodes:  No lymphadenopathy noted Axillary Nodes:  No palpable lymphadenopathy   Impression & Recommendations:  Problem # 1:   BRONCHITIS-ACUTE (ICD-466.0)  Her updated medication list for this problem includes:    Ventolin Hfa 108 (90 Base) Mcg/act Aers (Albuterol sulfate) .Marland Kitchen... 2 puffs every 4 hours p.r.n. cough and congestion    Doxycycline Hyclate 100 Mg Caps (Doxycycline hyclate) .Marland Kitchen... 1 two times a day x 5 days ,then 1 once daily  Orders: Prescription Created Electronically [phone]) T-2 View CXR (71020TC)  Problem # 2:  COPD (ICD-496)  Her updated medication list for this problem includes:    Ventolin Hfa 108 (90 Base) Mcg/act Aers (Albuterol sulfate) .Marland Kitchen... 2 puffs every 4 hours p.r.n. cough and congestion  Orders: T-2 View CXR (71020TC)  Complete Medication List: 1)  Pravachol 40 Mg Tabs (Pravastatin sodium) .Marland Kitchen.. 1 by mouth at bedtime 2)  Ventolin Hfa 108 (90 Base) Mcg/act Aers (Albuterol sulfate) .... 2 puffs every 4 hours p.r.n. cough and congestion 3)  Multivitamins Tabs (Multiple vitamin) .... Take 1 tablet by mouth once a day 4)  Vitamin C 1000 Mg Tabs (Ascorbic acid) .... Take 1 tablet by mouth once a day 5)  Vitamin D 1000 Unit Caps (Cholecalciferol) .... Take 1 tablet by mouth once a day 6)  Vitamin E 400 Unit Caps (Vitamin e) .... Take 1 tablet by mouth once a day 7)  Caltrate 600 1500 Mg Tabs (Calcium carbonate) .... 2 1/2 by mouth once daily 8)  Trazodone Hcl 50  Mg Tabs (Trazodone hcl) .Marland Kitchen.. 1 by mouth once daily 9)  Doxycycline Hyclate 100 Mg Caps (Doxycycline hyclate) .Marland Kitchen.. 1 two times a day x 5 days ,then 1 once daily 10)  Prednisone 20 Mg Tabs (Prednisone) .Marland Kitchen.. 1 two times a day with food  Patient Instructions: 1)  Drink as much fluid as you can tolerate for the next few days. Please complete Xrays Prescriptions: PREDNISONE 20 MG TABS (PREDNISONE) 1 two times a day with food  #14 x 0   Entered and Authorized by:   Marga Melnick MD   Signed by:   Marga Melnick MD on 06/19/2009   Method used:   Faxed to ...       [facility] [address] [id number] (retail)       [address]        [place], [place]  [address]       Ph: [phone]       Fax: [phone]   RxID:   [id number] DOXYCYCLINE HYCLATE 100 MG CAPS (DOXYCYCLINE HYCLATE) 1 two times a day X 5 days ,then 1 once daily  #15 x 0   Entered and Authorized by:   Marga Melnick MD   Signed by:   Marga Melnick MD on 06/19/2009   Method used:   Faxed to ...       [facility] [facility] [address] [id number] (retail)       [address]       [place], [place]  [address]       Ph: [phone]       Fax: [phone]   RxID:   [id number]

## 2010-05-28 NOTE — Miscellaneous (Signed)
Summary: Orders Update   Clinical Lists Changes  Problems: Added new problem of LEUKOCYTOSIS (ICD-288.60) Added new problem of CHEST XRAY, ABNORMAL (ICD-793.1) Orders: Added new Test order of T-2 View CXR (71020TC) - Signed

## 2010-05-28 NOTE — Progress Notes (Signed)
Summary: still no better  Phone Note Call from Patient   Caller: Patient Summary of Call: pt still c/o being very fatigue and not feeling well in general. pt states that she does still have a little cough as well but for the must part symptoms have resolved.  pls advise................Marland KitchenFelecia Deloach CMA  July 16, 2009 12:41 PM  Initial call taken by: Jeremy Johann CMA,  July 16, 2009 12:38 PM  Follow-up for Phone Call        repeat fasting labs: CBC& dif, BMET, hepatic panel, free T4, TSH (780.79, 466.0) then OV 1-2 days later Follow-up by: Marga Melnick MD,  July 16, 2009 1:56 PM  Additional Follow-up for Phone Call Additional follow up Details #1::        left message to call  office...............Marland KitchenFelecia Deloach CMA  July 16, 2009 3:05 PM     Additional Follow-up for Phone Call Additional follow up Details #2::    pt aware appts schedule......................Marland KitchenFelecia Deloach CMA  July 16, 2009 3:46 PM

## 2010-05-28 NOTE — Assessment & Plan Note (Signed)
Summary: f/u labs,still no better//fd   Vital Signs:  Patient profile:   77 year old female Weight:      99.4 pounds Temp:     98.3 degrees F oral Pulse rate:   76 / minute Resp:     16 per minute BP sitting:   128 / 70  (left arm) Cuff size:   regular  Vitals Entered By: Shonna Chock (July 19, 2009 9:22 AM) CC: 1.) Follow-up on labs (copy given)  2.) Feels better off/on, Fatigue Comments REVIEWED MED LIST, PATIENT AGREED DOSE AND INSTRUCTION CORRECT    Primary Care Provider:  Elmore Guise, MD  CC:  1.) Follow-up on labs (copy given)  2.) Feels better off/on and Fatigue.  History of Present Illness: Feeling "30%"  better but "so tired  she feels sick". Labs reviewed ; possibility of resolving hyperthyroidism disussed. The patient reports persistent fatigue, fatigue with minimal exertion, and primarily physical fatigue.  The patient also reports dyspnea.  The patient denies fever, night sweats, weight loss, exertional chest pain, cough, and hemoptysis.  Other symptoms include orthopnea, PND, and daytime sleepiness.  The patient denies the following symptoms: leg swelling, melena, adenopathy, severe snoring, and skin changes.  The patient denies feeling depressed, altered appetite, and poor sleep.  According to husband she did NOT quit smoking until last month.  Allergies: 1)  ! Pcn 2)  ! Cardizem 3)  ! Codeine 4)  ! * Calcium Channel Blockers 5)  ! Beta Blockers 6)  ! Lescol 7)  ! Zocor 8)  ! Barbiturates 9)  ! * Crestor 10)  ! Celebrex 11)  ! Boniva (Ibandronate Sodium) 12)  ! * Shrimp 13)  Asa  Review of Systems General:  Denies chills. ENT:  Complains of hoarseness; denies difficulty swallowing. CV:  Denies palpitations. Resp:  Denies excessive snoring; No apnea as per husband. GI:  Denies constipation and diarrhea; Loose stool occasionally. Derm:  Denies changes in nail beds, dryness, and hair loss. Neuro:  Denies numbness and tingling. Endo:  Denies cold  intolerance and heat intolerance.  Physical Exam  General:  Thin,in no acute distress; alert,appropriate and cooperative throughout examination Neck:  No deformities, masses, or tenderness noted. Lungs:  no intercostal retractions, no accessory muscle use, R decreased breath sounds, and L decreased breath sounds. O2 sats 95% @ rest; no drop  with ambulation  Heart:  Distant heart sounds , heard best in epigastrium Abdomen:  Bowel sounds positive,abdomen soft and non-tender without masses, organomegaly or hernias noted. Aorta palpable Extremities:  No  cyanosis, edema. Minor clubbing Neurologic:  alert & oriented X3 and DTRs  decreased @ knees Skin:  Intact without suspicious lesions or rashes Cervical Nodes:  No lymphadenopathy noted Axillary Nodes:  No palpable lymphadenopathy Psych:  memory intact for recent and remote, normally interactive, and good eye contact.     Impression & Recommendations:  Problem # 1:  FATIGUE (ICD-780.79)  Problem # 2:  THYROID FUNCTION TEST, ABNORMAL (ICD-794.5) ? resolving hyperthyroism  Problem # 3:  COPD (ICD-496)  Her updated medication list for this problem includes:    Ventolin Hfa 108 (90 Base) Mcg/act Aers (Albuterol sulfate) .Marland Kitchen... 2 puffs every 4 hours p.r.n. cough and congestion    Symbicort 160-4.5 Mcg/act Aero (Budesonide-formoterol fumarate) .Marland Kitchen... 1-2 puffs every 12 hrs as needed sob; gargle & spit after use  Orders: Misc. Referral (Misc. Ref)  Problem # 4:  LEUKOCYTOSIS (ICD-288.60) resolved  Complete Medication List: 1)  Pravachol 40 Mg  Tabs (Pravastatin sodium) .Marland Kitchen.. 1 by mouth at bedtime 2)  Ventolin Hfa 108 (90 Base) Mcg/act Aers (Albuterol sulfate) .... 2 puffs every 4 hours p.r.n. cough and congestion 3)  Multivitamins Tabs (Multiple vitamin) .... Take 1 tablet by mouth once a day 4)  Vitamin C 1000 Mg Tabs (Ascorbic acid) .... Take 1 tablet by mouth once a day 5)  Vitamin D 1000 Unit Caps (Cholecalciferol) .... Take 1 tablet  by mouth once a day 6)  Vitamin E 400 Unit Caps (Vitamin e) .... Take 1 tablet by mouth once a day 7)  Caltrate 600 1500 Mg Tabs (Calcium carbonate) .... 2 1/2 by mouth once daily 8)  Trazodone Hcl 50 Mg Tabs (Trazodone hcl) .Marland Kitchen.. 1 by mouth once daily 9)  Fluconazole 100 Mg Tabs (Fluconazole) .Marland Kitchen.. 1 once daily 10)  Clotrimazole 10 Mg Lozg (Clotrimazole) .... Dissolve in mouth 5x /day 11)  Symbicort 160-4.5 Mcg/act Aero (Budesonide-formoterol fumarate) .Marland Kitchen.. 1-2 puffs every 12 hrs as needed sob; gargle & spit after use  Patient Instructions: 1)  Please schedule a follow-up appointment in 3 months. 2)  TSH,free T3, free T4 prior to visit, ICD-9:794.5 3)  HbgA1C prior to visit, ICD-9:790.29 Prescriptions: SYMBICORT 160-4.5 MCG/ACT AERO (BUDESONIDE-FORMOTEROL FUMARATE) 1-2 puffs every 12 hrs as needed SOB; gargle & spit after use  #1 x 11   Entered and Authorized by:   Marga Melnick MD   Signed by:   Marga Melnick MD on 07/19/2009   Method used:   Print then Give to Patient   RxID:   [id number]

## 2010-05-28 NOTE — Miscellaneous (Signed)
Summary: Orders Update  Clinical Lists Changes  Orders: Added new Test order of T-2 View CXR (71020TC) - Signed 

## 2010-05-28 NOTE — Progress Notes (Signed)
Summary: Lab Results  Phone Note Outgoing Call Call back at [facility] Phone [phone]   Caller: Patient Call placed by: Shonna Chock,  July 02, 2009 3:54 PM Call placed to: Patient Summary of Call: Spoke with patient, patient ok'd all information below, schedule appointment for tomorrow to check labs, then patient will get Chest Xray.  White count very high ; this & the Chest Xray need to be rechecked . These can be done @ [facility]  07/02/2009.A1c is in Diabetes range; please avoid concentrated sweets ; recheck A1c in 8 weeks; goal = < 7%, ideally < 6.5%. Hopp  Chrae Malloy  July 02, 2009 3:59 PM

## 2010-05-28 NOTE — Assessment & Plan Note (Signed)
Summary: RTO 4 MONTH/CBS   Vital Signs:  Patient profile:   77 year old female Height:      63.5 inches Weight:      115 pounds BMI:     20.12 Pulse rate:   76 / minute Resp:     16 per minute BP sitting:   132 / 80  (left arm) Cuff size:   regular  Vitals Entered By: Shonna Chock CMA (February 26, 2010 9:26 AM) CC: 4 month follow-up on labs (patient with mailed copy of labs)   Primary Care Provider:  Elmore Guise, MD  CC:  4 month follow-up on labs (patient with mailed copy of labs).  History of Present Illness: TFTs reviewed ; Free T4 remains elevated ( & essentially stable) but TSH remains in ideal range ( also stable).Med & supplement list reviewed ; no agents which should impact TFTs.  Current Medications (verified): 1)  Pravachol 40 Mg Tabs (Pravastatin Sodium) .Marland Kitchen.. 1 By Mouth At Bedtime 2)  Ventolin Hfa 108 (90 Base) Mcg/act Aers (Albuterol Sulfate) .... 2 Puffs Every 4 Hours P.r.n. Cough and Congestion 3)  Multivitamins  Tabs (Multiple Vitamin) .... Take 1 Tablet By Mouth Once A Day 4)  Vitamin C 1000 Mg Tabs (Ascorbic Acid) .... Take 1 Tablet By Mouth Once A Day 5)  Vitamin D 1000 Unit Caps (Cholecalciferol) .... Take 1 Tablet By Mouth Once A Day 6)  Vitamin E 400 Unit Caps (Vitamin E) .... Take 1 Tablet By Mouth Once A Day 7)  Caltrate 600 1500 Mg Tabs (Calcium Carbonate) .... 2 1/2 By Mouth Once Daily 8)  Trazodone Hcl 50 Mg Tabs (Trazodone Hcl) .Marland Kitchen.. 1 By Mouth Once Daily 9)  Spiriva Handihaler 18 Mcg Caps (Tiotropium Bromide Monohydrate) .Marland Kitchen.. 1 Inhalation Once Daily  Allergies: 1)  ! Pcn 2)  ! Cardizem 3)  ! Codeine 4)  ! * Calcium Channel Blockers 5)  ! Beta Blockers 6)  ! Lescol 7)  ! Zocor 8)  ! Barbiturates 9)  ! * Crestor 10)  ! Celebrex 11)  ! Boniva (Ibandronate Sodium) 12)  ! 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 CVA ; sister  d MI @ age 109 brother-MI age 66 MGM-breast CA PGM-DM No FH of Colon Cancer:  Review of Systems General:  Denies weight loss; Some fatigue. Eyes:  Denies blurring, double vision, and vision loss-both eyes. ENT:  Denies difficulty swallowing and hoarseness. CV:  Denies palpitations. GI:  Denies constipation and diarrhea. Derm:  Complains of dryness; denies changes in nail beds and hair loss. Neuro:  Denies numbness and tingling. Endo:  Denies cold intolerance and heat intolerance.  Physical Exam  General:  Thin but in no acute distress; alert,appropriate and cooperative throughout examination Eyes:  No corneal or conjunctival inflammation noted.No lid lag Neck:  No deformities, masses, or tenderness noted. L lobe small; no definite nodule Heart:  normal rate, regular rhythm, no gallop, no rub, no JVD, and grade 1/2 -1  /6 systolic murmur.   Neurologic:  alert & oriented X3 and DTRs symmetrical and normal.   No tremor Skin:  Intact without suspicious lesions or rashes   Impression & Recommendations:  Problem # 1:  THYROID FUNCTION TEST, ABNORMAL (ICD-794.5)   Stable; FH thyroid disease  Orders: Radiology Referral (Radiology)  Complete Medication List: 1)  Pravachol 40 Mg Tabs (Pravastatin sodium) .Marland Kitchen.. 1 by mouth at bedtime 2)  Ventolin Hfa 108 (90 Base) Mcg/act Aers (Albuterol sulfate) .... 2 puffs every 4 hours p.r.n. cough and congestion 3)  Multivitamins Tabs (Multiple vitamin) .... Take 1 tablet by mouth once a day 4)  Vitamin C 1000 Mg Tabs (Ascorbic acid) .... Take 1 tablet by mouth once a day 5)  Vitamin D 1000 Unit Caps  (Cholecalciferol) .... Take 1 tablet by mouth once a day 6)  Vitamin E 400 Unit Caps (Vitamin e) .... Take 1 tablet by mouth once a day 7)  Caltrate 600 1500 Mg Tabs (Calcium carbonate) .... 2 1/2 by mouth once daily 8)  Trazodone Hcl 50 Mg Tabs (Trazodone hcl) .Marland Kitchen.. 1 by mouth once daily 9)  Spiriva Handihaler 18 Mcg Caps (Tiotropium bromide monohydrate) .Marland Kitchen.. 1 inhalation once daily  Patient Instructions: 1)  Please schedule a follow-up appointment in 6  months. 2)  TSH, free T4 , free T3  prior to visit, ICD-9:794.5   Orders Added: 1)  Est. Patient Level III [[address]] 2)  Radiology Referral [Radiology]

## 2010-05-28 NOTE — Progress Notes (Signed)
Summary: Request Results/No better  Phone Note Call from Patient Call back at Home Phone [phone]   Caller: Patient Summary of Call: Patient left message on VM: Please call with lab/chest xray results   I spoke with patient, and informed her labs and chest xray mailed, both normal.  Patient ok'd and said she is no better and would like to know whats the next step.  Dr.Tommi Crepeau please advise Initial call taken by: Shonna Chock,  July 05, 2009 10:17 AM  Follow-up for Phone Call        she needs to complete both meds for the thrush completely.If thrush worse ,IV meds in hospital would be necessary Follow-up by: Marga Melnick MD,  July 05, 2009 10:42 AM  Additional Follow-up for Phone Call Additional follow up Details #1::        pt aware.................Marland KitchenFelecia Deloach CMA  July 05, 2009 10:56 AM

## 2010-05-28 NOTE — Assessment & Plan Note (Signed)
Summary: thrush/kdc   Vital Signs:  Patient profile:   77 year old female Height:      63.5 inches Weight:      99 pounds BMI:     17.32 O2 Sat:      94 % on Room air Temp:     98.5 degrees F oral Pulse rate:   104 / minute BP sitting:   120 / 70  (left arm)  Vitals Entered By: Doristine Devoid (June 29, 2009 2:25 PM)  O2 Flow:  Room air CC: thrush and still w/ bronchitis    Primary Care Provider:  Elmore Guise, MD  CC:  thrush and still w/ bronchitis .  History of Present Illness: Difficulty eating & swallowing for 2-3 days due to tongue burning; she completed Doxycycline & Prednisone 03/02. She has profound fatigue with intermittent SOB & lightheaded.  Allergies: 1)  ! Pcn 2)  ! Cardizem 3)  ! Codeine 4)  ! * Calcium Channel Blockers 5)  ! Beta Blockers 6)  ! Lescol 7)  ! Zocor 8)  ! Barbiturates 9)  ! * Crestor 10)  ! Celebrex 11)  ! Boniva (Ibandronate Sodium) 12)  ! * Shrimp 13)  Asa  Review of Systems General:  Denies chills, fever, and sweats. ENT:  Denies hoarseness; No purulence. CV:  Denies difficulty breathing at night and difficulty breathing while lying down. Resp:  Complains of cough; denies sputum productive; Husband noted some wheezing @ night.  Physical Exam  General:  Thin but  in no acute distress; alert,appropriate and cooperative throughout examination Eyes:  No corneal or conjunctival inflammation noted. No icterus Mouth:  pharyngeal erythema and xerostomia.  Glossitis of tongue Lungs:   Lungs are clear to auscultation, no crackles or wheezes but BS decreased. Heart:  no murmur, no rub, no JVD, and S4 gallop.   Skin:  Intact without suspicious lesions or rashes, no  jaundice Cervical Nodes:  No lymphadenopathy noted Axillary Nodes:  No palpable lymphadenopathy   Impression & Recommendations:  Problem # 1:  CANDIDIASIS OF MOUTH (ICD-112.0)  Orders: Venipuncture (28413) TLB-A1C / Hgb A1C (Glycohemoglobin) (83036-A1C)  Problem # 2:   FATIGUE (ICD-780.79)  Orders: Venipuncture (24401) TLB-CBC Platelet - w/Differential (85025-CBCD) TLB-ALT (SGPT) (84460-ALT) TLB-AST (SGOT) (84450-SGOT) TLB-Creatinine, Blood (82565-CREA) TLB-Potassium (K+) (84132-K) TLB-BUN (Urea Nitrogen) (84520-BUN) TLB-TSH (Thyroid Stimulating Hormone) (84443-TSH)  Complete Medication List: 1)  Pravachol 40 Mg Tabs (Pravastatin sodium) .Marland Kitchen.. 1 by mouth at bedtime 2)  Ventolin Hfa 108 (90 Base) Mcg/act Aers (Albuterol sulfate) .... 2 puffs every 4 hours p.r.n. cough and congestion 3)  Multivitamins Tabs (Multiple vitamin) .... Take 1 tablet by mouth once a day 4)  Vitamin C 1000 Mg Tabs (Ascorbic acid) .... Take 1 tablet by mouth once a day 5)  Vitamin D 1000 Unit Caps (Cholecalciferol) .... Take 1 tablet by mouth once a day 6)  Vitamin E 400 Unit Caps (Vitamin e) .... Take 1 tablet by mouth once a day 7)  Caltrate 600 1500 Mg Tabs (Calcium carbonate) .... 2 1/2 by mouth once daily 8)  Trazodone Hcl 50 Mg Tabs (Trazodone hcl) .Marland Kitchen.. 1 by mouth once daily 9)  Fluconazole 100 Mg Tabs (Fluconazole) .Marland Kitchen.. 1 once daily 10)  Clotrimazole 10 Mg Lozg (Clotrimazole) .... Dissolve in mouth 5x /day  Patient Instructions: 1)  Hold Pravastatin until meds completed for  oral Candiasis. Boost or Ensure  1 can  Prescriptions: CLOTRIMAZOLE 10 MG LOZG (CLOTRIMAZOLE) dissolve in mouth 5X /day  #  70 x 0   Entered and Authorized by:   Marga Melnick MD   Signed by:   Marga Melnick MD on 06/29/2009   Method used:   Print then Give to Patient   RxID:   [id number] FLUCONAZOLE 100 MG TABS (FLUCONAZOLE) 1 once daily  #10 x 0   Entered and Authorized by:   Marga Melnick MD   Signed by:   Marga Melnick MD on 06/29/2009   Method used:   Print then Give to Patient   RxID:   [id number]

## 2010-05-28 NOTE — Assessment & Plan Note (Signed)
Summary: 3 MONTH OV//PH   Vital Signs:  Patient profile:   77 year old female Weight:      109.6 pounds Pulse rate:   80 / minute Resp:     17 per minute BP sitting:   128 / 80  (left arm) Cuff size:   regular  Vitals Entered By: Shonna Chock (October 17, 2009 9:03 AM) CC: 3 Month Follow-Up (Copy of labs given), Refill meds , Back pain Comments REVIEWED MED LIST, PATIENT AGREED DOSE AND INSTRUCTION CORRECT    Primary Care Provider:  Elmore Guise, MD  CC:  3 Month Follow-Up (Copy of labs given), Refill meds , and Back pain.  History of Present Illness: Dot is asymptomatic except for intermittent aching  in back & radiates down posterior  RLE  w/o trigger or relievers. Minor dull back ache today.  The patient reports rest pain, but denies fever, chills, weakness, loss of sensation, fecal incontinence, urinary incontinence, urinary retention, dysuria, and inability to care for self.  The pain is located in the right low back.  The pain began at home and gradually.  The pain radiates to the right leg below the knee.  The pain is made worse by standing or walking.  Risk factors for serious underlying conditions include duration of pain > 1 month, age >= 50 years, and history of osteopenia. PMH of  'bulging disc " in 20s & renal calculi  Allergies: 1)  ! Pcn 2)  ! Cardizem 3)  ! Codeine 4)  ! * Calcium Channel Blockers 5)  ! Beta Blockers 6)  ! Lescol 7)  ! Zocor 8)  ! Barbiturates 9)  ! * Crestor 10)  ! Celebrex 11)  ! Boniva (Ibandronate Sodium) 12)  ! * Shrimp 13)  Asa  Review of Systems General:  Denies fatigue and weight loss. ENT:  Denies difficulty swallowing and hoarseness. GI:  Denies constipation and diarrhea. GU:  Denies discharge and hematuria. Derm:  Denies lesion(s) and rash. Neuro:  Denies brief paralysis, numbness, and tingling. Endo:  Denies cold intolerance and heat intolerance.  Physical Exam  General:  Thin but in no acute distress; alert,appropriate and  cooperative throughout examination Neck:  No deformities, masses, or tenderness noted. Thyroid small Lungs:  Normal respiratory effort, chest expands symmetrically. Lungs are clear to auscultation, no crackles or wheezes. BS decreased  Abdomen:  Bowel sounds positive,abdomen soft and non-tender without masses, organomegaly or hernias noted. No AAA Msk:  No pain to percussion. Reverse LS curve Pulses:  R and L dorsalis pedis and posterior tibial pulses are full and equal bilaterally Extremities:  No clubbing, cyanosis, edema, or deformity noted with normal full range of motion of all joints.  Neg SLR to 90 degrees Neurologic:  alert & oriented X3, strength normal in all extremities, gait(heel/toe) normal, and DTRs symmetrical and normal.   Skin:  Intact without suspicious lesions or rashes   Impression & Recommendations:  Problem # 1:  LOW BACK PAIN, CHRONIC (ICD-724.2) Intermittent  Problem # 2:  LUMBAR RADICULOPATHY, RIGHT (ICD-724.4) related to #1  Problem # 3:  THYROID FUNCTION TEST, ABNORMAL (ICD-794.5) high Free T4 with normal TSH & FreeT3  Problem # 4:  HYPERLIPIDEMIA (ICD-272.4)  Her updated medication list for this problem includes:    Pravachol 40 Mg Tabs (Pravastatin sodium) .Marland Kitchen... 1 by mouth at bedtime  Orders: Venipuncture (04540) TLB-ALT (SGPT) (84460-ALT) TLB-AST (SGOT) (84450-SGOT) TLB-Lipid Panel (80061-LIPID)  Complete Medication List: 1)  Pravachol 40 Mg Tabs (Pravastatin  sodium) .Marland Kitchen.. 1 by mouth at bedtime 2)  Ventolin Hfa 108 (90 Base) Mcg/act Aers (Albuterol sulfate) .... 2 puffs every 4 hours p.r.n. cough and congestion 3)  Multivitamins Tabs (Multiple vitamin) .... Take 1 tablet by mouth once a day 4)  Vitamin C 1000 Mg Tabs (Ascorbic acid) .... Take 1 tablet by mouth once a day 5)  Vitamin D 1000 Unit Caps (Cholecalciferol) .... Take 1 tablet by mouth once a day 6)  Vitamin E 400 Unit Caps (Vitamin e) .... Take 1 tablet by mouth once a day 7)  Caltrate  600 1500 Mg Tabs (Calcium carbonate) .... 2 1/2 by mouth once daily 8)  Trazodone Hcl 50 Mg Tabs (Trazodone hcl) .Marland Kitchen.. 1 by mouth once daily 9)  Symbicort 160-4.5 Mcg/act Aero (Budesonide-formoterol fumarate) .Marland Kitchen.. 1-2 puffs every 12 hrs as needed sob; gargle & spit after use 10)  Spiriva Handihaler 18 Mcg Caps (Tiotropium bromide monohydrate) .Marland Kitchen.. 1 inhalation once daily 11)  Gabapentin 100 Mg Caps (Gabapentin) .Marland Kitchen.. 1 every 8 hrs as needed for leg pain  Patient Instructions: 1)  Please schedule a follow-up appointment in 4 months. 2)  TSH,free T4, & free T3 prior to visit, ICD-9: 794.5 Prescriptions: SPIRIVA HANDIHALER 18 MCG CAPS (TIOTROPIUM BROMIDE MONOHYDRATE) 1 inhalation once daily  #1 x 11   Entered and Authorized by:   Marga Melnick MD   Signed by:   Marga Melnick MD on 10/17/2009   Method used:   Print then Give to Patient   RxID:   [id number] GABAPENTIN 100 MG CAPS (GABAPENTIN) 1 every 8 hrs as needed for leg pain  #30 x 1   Entered and Authorized by:   Marga Melnick MD   Signed by:   Marga Melnick MD on 10/17/2009   Method used:   Print then Give to Patient   RxID:   [id number] TRAZODONE HCL 50 MG TABS (TRAZODONE HCL) 1 by mouth once daily  #90 Tablet x 1   Entered and Authorized by:   Marga Melnick MD   Signed by:   Marga Melnick MD on 10/17/2009   Method used:   Print then Give to Patient   RxID:   [id number] VENTOLIN HFA 108 (90 BASE) MCG/ACT AERS (ALBUTEROL SULFATE) 2 puffs every 4 hours p.r.n. cough and congestion  #1 x 3   Entered and Authorized by:   Marga Melnick MD   Signed by:   Marga Melnick MD on 10/17/2009   Method used:   Print then Give to Patient   RxID:   [id number] PRAVACHOL 40 MG TABS (PRAVASTATIN SODIUM) 1 by mouth at bedtime  #90 x 3   Entered and Authorized by:   Marga Melnick MD   Signed by:   Marga Melnick MD on 10/17/2009   Method used:   Print then Give to Patient   RxID:   [id number]

## 2010-05-28 NOTE — Progress Notes (Signed)
Summary: continue other meds  Phone Note Call from Patient Call back at Home Phone [phone]   Caller: Patient Summary of Call: Pt want to know when she start the Spiriva is she to still continue with symbicort and ventolin as well. pls advise............Marland KitchenFelecia Deloach CMA  August 08, 2009 1:22 PM  Initial call taken by: Jeremy Johann CMA,  August 08, 2009 1:20 PM  Follow-up for Phone Call        Continue Symbicort 1-2 puffs two times a day as needed ; Ventolin  is rescue agent every 4 hrs as needed . Barnetta Chapel is maintenance agent Follow-up by: Marga Melnick MD,  August 08, 2009 1:39 PM  Additional Follow-up for Phone Call Additional follow up Details #1::        pt aware,sample and rx placed up front for pickup............Marland KitchenFelecia Deloach CMA  August 08, 2009 2:19 PM     New/Updated Medications: SPIRIVA HANDIHALER 18 MCG CAPS (TIOTROPIUM BROMIDE MONOHYDRATE) 1 inhalation once daily Prescriptions: SPIRIVA HANDIHALER 18 MCG CAPS (TIOTROPIUM BROMIDE MONOHYDRATE) 1 inhalation once daily  #1 x 2   Entered by:   Jeremy Johann CMA   Authorized by:   Marga Melnick MD   Signed by:   Jeremy Johann CMA on 08/08/2009   Method used:   Print then Give to Patient   RxID:   [id number]

## 2010-05-28 NOTE — Miscellaneous (Signed)
Summary: Orders Update pft charges  Clinical Lists Changes  Orders: Added new Service order of Carbon Monoxide diffusing w/capacity (94720) - Signed Added new Service order of Lung Volumes (94240) - Signed Added new Service order of Spirometry (Pre & Post) (94060) - Signed 

## 2010-06-22 ENCOUNTER — Encounter: Payer: Self-pay | Admitting: Internal Medicine

## 2010-07-09 NOTE — Medication Information (Signed)
Summary: Ventolin not covered/[employer] Medicare  Ventolin not covered/[employer] Medicare   Imported By: Maryln Gottron 07/02/2010 15:33:23  _____________________________________________________________________  External Attachment:    Type:   Image     Comment:   External Document

## 2010-08-20 ENCOUNTER — Other Ambulatory Visit: Payer: Self-pay | Admitting: Internal Medicine

## 2010-08-28 ENCOUNTER — Other Ambulatory Visit: Payer: Self-pay | Admitting: Internal Medicine

## 2010-08-28 ENCOUNTER — Other Ambulatory Visit: Payer: Self-pay

## 2010-08-28 DIAGNOSIS — R946 Abnormal results of thyroid function studies: Secondary | ICD-10-CM

## 2010-08-29 ENCOUNTER — Other Ambulatory Visit (INDEPENDENT_AMBULATORY_CARE_PROVIDER_SITE_OTHER): Payer: Medicare Other

## 2010-08-29 DIAGNOSIS — R946 Abnormal results of thyroid function studies: Secondary | ICD-10-CM

## 2010-08-30 LAB — TSH: TSH: 1 u[IU]/mL (ref 0.35–5.50)

## 2010-08-30 LAB — T3, FREE: T3, Free: 2.4 pg/mL (ref 2.3–4.2)

## 2010-08-30 LAB — T4, FREE: Free T4: 2.44 ng/dL — ABNORMAL HIGH (ref 0.60–1.60)

## 2010-08-31 ENCOUNTER — Encounter: Payer: Self-pay | Admitting: Internal Medicine

## 2010-09-03 ENCOUNTER — Encounter: Payer: Self-pay | Admitting: Internal Medicine

## 2010-09-04 ENCOUNTER — Ambulatory Visit (INDEPENDENT_AMBULATORY_CARE_PROVIDER_SITE_OTHER): Payer: Medicare Other | Admitting: Internal Medicine

## 2010-09-04 ENCOUNTER — Telehealth: Payer: Self-pay | Admitting: Gastroenterology

## 2010-09-04 ENCOUNTER — Encounter: Payer: Self-pay | Admitting: Internal Medicine

## 2010-09-04 DIAGNOSIS — R946 Abnormal results of thyroid function studies: Secondary | ICD-10-CM

## 2010-09-04 DIAGNOSIS — K224 Dyskinesia of esophagus: Secondary | ICD-10-CM

## 2010-09-04 DIAGNOSIS — E785 Hyperlipidemia, unspecified: Secondary | ICD-10-CM

## 2010-09-04 NOTE — Progress Notes (Signed)
  Subjective:    Patient ID: Amber Snow, female    DOB: [birth year], 77 y.o.   MRN: [id number]  HPI The serial thyroid function tests were reviewed. In retrospect it appears that she had mild hyperthyroidism approximately 10-12 months ago. At that time the free T4 was 3.27. Her TSH was 0.96. Her most recent free T4 is now 2.44 suggesting that the hyperthyroid state is spontaneously resolving.  She gives a history of possible hyperthyroidism remotely. At that time the doctor prescribed barbiturates.                                                                                                                            Constitutional: weight change : stable; significant fatigue:some the day after she is active ; sleep pattern: good; anorexia:no.                                                                                                                            Eyes: vision change(blurred/diplopia/loss): no. ENT/Mouth: hoarseness: no; swallowing issues: pain AFTER swallowing. CV: palpitations: n0; racing: no ; irregularity:no. GI: constipation: no; diarrhea: no.    Derm: change in nails/ skin/hair: nails brittle. Neuro: numbness /tingling:no; tremor:no. Psych: anxiety: no; depression: no ; panic attacks: no.  Endo: temperature intolerance:heat: no ; cold : yes.     Review of Systems     Objective:   Physical Exam she is thin but in no acute distress area. Thyroid is small and slightly irregular; there are no definite nodules.  Extraocular motion is intact; she has no lid lag. She has a slow S4 without murmurs or gallops. The tendon reflexes are 0-1/2+. No tremors noted. Nails are painted. Clubbing of the fingernails is suggested.        Assessment & Plan:   #1 hyperthyroidism which appears to be resolving spontaneously  #2 neck fracture esophagus, exacerbation of symptoms recently  Plan: #1 Repeat the full thyroid function panel in 4 months; this should be done sooner should she  have any significant symptoms as noted.  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 A

## 2010-09-04 NOTE — Assessment & Plan Note (Signed)
NMR Lipoprofile 2010: LDL 161(0960/454), HDL73, TG 129.

## 2010-09-04 NOTE — Telephone Encounter (Signed)
Pt saw Dr. Alwyn Ren today and has pain after swallowing. Requesting appt. Appt made for pt to see Willette Cluster NP 09/06/10@2pm . Renee to notify pt of appt date and time.

## 2010-09-04 NOTE — Patient Instructions (Signed)
Recheck free T3, free T4, and TSH in 4 months (794.5)

## 2010-09-05 ENCOUNTER — Telehealth: Payer: Self-pay

## 2010-09-05 NOTE — Telephone Encounter (Signed)
Pt called and was scheduled to see Amber Snow but wants to cancel this and see Amber Snow. Pt was offered an appt to see Dr. Arlyce Snow June 6th but the pt could not come that day. Instructed the patient to call back in a couple of weeks to schedule appt with Dr. Arlyce Snow when the rest of the June schedule was out. She verbalized understanding.

## 2010-09-06 ENCOUNTER — Ambulatory Visit: Payer: Medicare Other | Admitting: Nurse Practitioner

## 2010-09-09 ENCOUNTER — Ambulatory Visit (INDEPENDENT_AMBULATORY_CARE_PROVIDER_SITE_OTHER): Payer: Medicare Other | Admitting: Gastroenterology

## 2010-09-09 ENCOUNTER — Encounter: Payer: Self-pay | Admitting: Gastroenterology

## 2010-09-09 DIAGNOSIS — R1319 Other dysphagia: Secondary | ICD-10-CM | POA: Insufficient documentation

## 2010-09-09 DIAGNOSIS — R1310 Dysphagia, unspecified: Secondary | ICD-10-CM

## 2010-09-09 DIAGNOSIS — K219 Gastro-esophageal reflux disease without esophagitis: Secondary | ICD-10-CM

## 2010-09-09 NOTE — Assessment & Plan Note (Signed)
Symptoms could be do to a fixed esophageal stricture. A motility disorder including nutcracker esophagus could also be operative.  Recommendations #1 upper endoscopy with dilatation as indicated  Risks, alternatives, and complications of the procedure, including bleeding, perforation, and possible need for surgery, were explained to the patient.  Patient's questions were answered.

## 2010-09-09 NOTE — Progress Notes (Signed)
History of Present Illness:  Mrs. Childrey has returned for evaluation of chest discomfort and dysphagia. Last endoscopy in 2009 demonstrated esophageal blebs in the distal esophagus and questionable varices. CT scan was negative. 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 She complains of dysphagia  to solids with some discomfort that radiates to her back. She denies pyrosis.    Review of Systems: Pertinent positive and negative review of systems were noted in the above HPI section. All other review of systems were otherwise negative.    Current Medications, Allergies, Past Medical History, Past Surgical History, Family History and Social History were reviewed in [employer] [facility] electronic medical record  Vital signs were reviewed in today's medical record. Physical Exam: General: Well developed , well nourished, no acute distress Head: Normocephalic and atraumatic Eyes:  sclerae anicteric, EOMI Ears: Normal auditory acuity Mouth: No deformity or lesions Lungs: Clear throughout to auscultation Heart: Regular rate and rhythm; no murmurs, rubs or bruits Abdomen: Soft, non tender and non distended. No masses, hepatosplenomegaly or hernias noted. Normal Bowel sounds Rectal:deferred Musculoskeletal: Symmetrical with no gross deformities  Pulses:  Normal pulses noted Extremities: No clubbing, cyanosis, edema or deformities noted Neurological: Alert oriented x 4, grossly nonfocal Psychological:  Alert and cooperative. Normal mood and affect

## 2010-09-09 NOTE — Patient Instructions (Addendum)
CcChrissie Noa Hopper,MD Your Endoscopy is scheduled on 09/12/2010 at 3:30pm Upper GI Endoscopy Upper GI endoscopy means using a flexible scope to look at the esophagus, stomach and upper small bowel. This is done to make a diagnosis in people with heartburn, abdominal pain, or abnormal bleeding. Sometimes an endoscope is needed to remove foreign bodies or food that become stuck in the esophagus; it can also be used to take biopsy samples. For the best results, do not eat or drink for 8 hours before having your upper endoscopy.  To perform the endoscopy, you will probably be sedated and your throat will be numbed with a special spray. The endoscope is then slowly passed down your throat (this will not interfere with your breathing). An endoscopy exam takes 15-30 minutes to complete and there is no real pain. Patients rarely remember much about the procedure. The results of the test may take several days if a biopsy or other test is taken.  You may have a sore throat after an endoscopy exam. Serious complications are very rare. Stick to liquids and soft foods until your pain is better. You should not drive a car or operate any dangerous equipment for at least 24 hours after being sedated. SEEK IMMEDIATE MEDICAL CARE IF:  You have severe throat pain.   You have shortness of breath.   You have bleeding problems.   You have a fever.   You have difficulty recovering from your sedation.  Document Released: 05/22/2004 Document Re-Released: 07/09/2009 [facility] Patient Information 2011 [place], [place].

## 2010-09-10 NOTE — Assessment & Plan Note (Signed)
 HEALTHCARE                         GASTROENTEROLOGY OFFICE NOTE   Amber Snow, Amber Snow                     MRN:          [id number]  DATE:11/09/2006                            DOB:          [birth year]    REASON FOR CONSULTATION:  Weight loss.  Amber Snow is a pleasant 77 year old white female referred through the  courtesy of Dr. Alwyn Ren for evaluation.  Over the past year, she has lost  15 to 20 pounds, though she is not aware why.  Appetite is excellent.  She has no specific GI complaints.  She does note that, after  swallowing, she may have some what she describes as crampy chest  discomfort.  She denies dysphagia, per se, odynophagia, nausea, or upper  abdominal pain.  Appetite is excellent.  Laboratory, including a CBC  BMET, and thyroid tests, were normal.   PAST MEDICAL HISTORY:  Pertinent for recent pneumonia.  She has a  history of kidney stones.  She is status post tubal ligation.   FAMILY HISTORY:  Noncontributory.   MEDICATIONS:  Pravachol, trazodone, and Boniva.   ALLERGIES:  PENICILLIN, CODEINE, SHRIMP, and ASPIRIN.   She neither smokes nor drinks.  She is married and retired.   REVIEW OF SYSTEMS:  Positive for frequent cough and back pain.  She has  also had some loss of hearing.   EXAM:  She is a thin female.  Pulse 76, blood pressure 138/74, weight 100.6.  HEENT:  EOMI. PERRLA. Sclerae are anicteric.  Conjunctivae are pink.  NECK:  Supple without thyromegaly, adenopathy or carotid bruits.  CHEST:  Clear to auscultation and percussion without adventitious  sounds.  CARDIAC:  There is a 1/6 early systolic murmur.  ABDOMEN:  Bowel sounds are normoactive.  Abdomen is soft, non-tender and  non-distended.  There are no abdominal masses, tenderness, splenic  enlargement or hepatomegaly.  EXTREMITIES:  Full range of motion.  No cyanosis, clubbing or edema.  RECTAL:  Deferred.   IMPRESSION:  1. Weight loss.  There is no  apparent reason for this.  I do not think      she has ongoing gastrointestinal pathology.  2. Chest discomfort.  Symptoms suggest possible esophageal spasm.  It      is unlikely that she has an esophageal stricture.   RECOMMENDATION:  1. Followup weight on a monthly basis.  If she continues to lose      weight, I would obtain a CT of      the abdomen, pelvis, and chest.  2. Screening colonoscopy.     Barbette Hair. Amber Dice, MD,FACG  Electronically Signed    RDK/MedQ  DD: 11/09/2006  DT: 11/10/2006  Job #: [id number]   cc:   Amber Snow. Alwyn Ren, MD,FACP,FCCP

## 2010-09-10 NOTE — Assessment & Plan Note (Signed)
[facility]                                 ON-CALL NOTE   Amber Snow, Amber Snow                     MRN:          [id number]  DATE:10/02/2006                            DOB:          01-Jul-1932    TIME OF CALL:  5:00 p.m.   PHONE NUMBER:  [phone].   CALLER:  Amber Snow from [facility] about abnormal chest x-ray. She  said that Amber Snow has a probable right lower lobe infiltrate  superimposed on chronic changes and she wanted to call it to the doctor  on call. I took the call and then called Dr. Drue Snow, her regular physician,  via the private line at his office and he will contact the patient to  discuss the plan.     Amber A. Tower, MD  Electronically Signed    MAT/MedQ  DD: 10/02/2006  DT: 10/03/2006  Job #: [id number]   cc:   Amber Ora, MD

## 2010-09-10 NOTE — Letter (Signed)
November 09, 2006    Titus Dubin. Alwyn Ren, MD,FACP,FCCP  [phone] [place], [place] [address]   RE:  Amber, Snow  MRN:  [id number]  /  DOB:  21-Jul-1932   Dear Dr. Alwyn Ren:   Upon your kind referral, I had the pleasure of evaluating your patient  and I am pleased to offer my findings.  I saw Amber Snow in the office  today.  Enclosed is a copy of my progress note that details my findings  and recommendations.   Thank you for the opportunity to participate in your patient's care.    Sincerely,      Barbette Hair. Arlyce Dice, MD,FACG  Electronically Signed    RDK/MedQ  DD: 11/09/2006  DT: 11/10/2006  Job #: [id number]

## 2010-09-12 ENCOUNTER — Ambulatory Visit (AMBULATORY_SURGERY_CENTER): Payer: Medicare Other | Admitting: Gastroenterology

## 2010-09-12 ENCOUNTER — Other Ambulatory Visit: Payer: Medicare Other | Admitting: Gastroenterology

## 2010-09-12 ENCOUNTER — Encounter: Payer: Self-pay | Admitting: Gastroenterology

## 2010-09-12 DIAGNOSIS — R1310 Dysphagia, unspecified: Secondary | ICD-10-CM

## 2010-09-12 DIAGNOSIS — K228 Other specified diseases of esophagus: Secondary | ICD-10-CM

## 2010-09-12 MED ORDER — SODIUM CHLORIDE 0.9 % IV SOLN: 500.0000 mL | INTRAVENOUS | Status: DC

## 2010-09-12 NOTE — Patient Instructions (Signed)
Your upper scope was essentially normal.   You only need to have another one if you are symptomatic.   Please take your routine medications today.     You also need to stop smoking.   Smoking irritates the esophagus and other organs.  You are also at a higher rate of lung cancer.  Please call us if you have any questions at [phone].  Thank-you.

## 2010-09-13 ENCOUNTER — Telehealth: Payer: Self-pay

## 2010-09-13 NOTE — Op Note (Signed)
[facility]  Patient:    Amber Snow, Amber Snow Visit Number: [id number] MRN: [id number]          Service Type: DSU Location: DAY Attending Physician:  Dalbert Mayotte Dictated by:   Radene Knee., M.D. Proc. Date: 10/16/01 Admit Date:  10/16/2001   CC:         Titus Dubin. Alwyn Ren, M.D. [facility]   Operative Report  PREOPERATIVE DIAGNOSES: 1. Left ureteral calculus with hydronephrosis grade 1-2. 2. Bilateral renal calcifications. 3. Asthma chronic obstructive pulmonary disease secondary to cigarette    abuse.  POSTOPERATIVE DIAGNOSES: 1. Left ureteral calculus with hydronephrosis grade 1-2. 2. Bilateral renal calcifications. 3. Asthma chronic obstructive pulmonary disease secondary to cigarette    abuse.  OPERATION PERFORMED:  Cystoscopy, left retrograde pyelogram and insertion of a left ureteral stent 6 x 26 double J.  DESCRIPTION OF PROCEDURE:  This 77 year old female brought to the operating room underwent successful induction of general anesthesia and was prepped and draped in the lithotomy position. Using the female sounds, the urethral meatus which was tight was dilated to a 30 [place]. There was just a small amount of bleeding briefly from the meatus. The bladder was then inspected with a #22 cystourethroscope using 70 and 12 degree lenses. Initially no stone or tumor was noted in the bladder and there was only mild hyperemia and erythema. The right and left ureteral orifices were normal. Using a #6 open ended catheter and 0.038 Glidewire, the left ureter was intubated with fluoroscopic control and retrograde pyelogram was performed which demonstrated grade 1 hydronephrosis and a stone obstructing partially the left upper ureter at the L3 level. The 0.038 Glidewire was then reintroduced, the open ended catheter removed and over the Glidewire was passed a #6 x 26 double J ureteral stent which was noted to be in good position in the  kidney by fluoroscopy and in the bladder by cystoscopy. The pull string was taped to the suprapubic area, the bladder was emptied and the patient returned to the recovery area in a stable condition.  The plan is for the patient to go home. She will take Cipro 500 b.i.d., Mepergan Fortis 1 q. 4-6h p.r.n. for pain and will schedule shockwave lithotripsy of the upper ureteral stone on October 18, 2001. Dictated by:   Radene Knee., M.D. Attending Physician:  Dalbert Mayotte DD:  10/16/01 TD:  10/17/01 Job: [id number]

## 2010-09-13 NOTE — H&P (Signed)
[facility]  Patient:    Amber Snow, Amber Snow Visit Number: [id number] MRN: [id number]          Service Type: NES Location: NESC Attending Physician:  Dalbert Mayotte Dictated by:   Radene Knee., M.D. Admit Date:  10/18/2001   CC:         Titus Dubin. Alwyn Ren, M.D. [facility]   History and Physical  CHIEF COMPLAINT:  This patient, age 77, is scheduled for cystoscopy and left ureteral stent and possible ESWL versus ureteroscopy and laser fragmentation of a ureteral calculus with chief complaint of pain in the left side, onset three weeks ago.  HISTORY OF PRESENT ILLNESS:  This 77 year old female was seen initially in our office October 14, 2001, on referral from Dr. Alwyn Ren with a history of having had a urinary tract infection some 30 years ago which was treated without any complications, and she did well.  She had a lung scan in 1991, and was told at that time that she had calcifications in her kidneys but had no symptoms. Approximately three weeks ago she noted some severe discomfort in her left side which came on fairly abruptly and left fairly abruptly.  She did not see any gross blood, gravel, or stone passed in the urine, however.  The pain recurred around October 09, 2001, and she saw Dr. Alwyn Ren, who ordered an IVP performed at [facility] on October 13, 2001.  These films are reviewed, and they do show bilateral renal calcifications.  There is a left hydronephrosis, and there is a suggestion of a 5 or 6 mm calculus at the L3 level on the left.  There is also a little holdup at the left urethrovesical junction, but no definite calcification could be seen at that area.  The calcification at the L3 level is very indistinct on the IVP films, and she will need a CT without contrast to confirm the presence of this calcification and to be sure it has not moved. If it remains in the upper ureter she will need a stent inserted, and she will need shockwave  lithotripsy.  If it has moved into the lower ureter she will need cystoscopy, ureteroscopy, and holmium laser fragmentation of the calculus with stenting.  We placed her on Cipro 500 b.i.d. pending her culture report on October 14, 2001.  ALLERGIES:  PENICILLIN and BETA BLOCKERS including CARDIZEM, and she has nausea with CODEINE, ASPIRIN, BARBITUATES, and ULTRACET.  MEDICATIONS:  Pravachol, Estraderm, Prometrium, vitamins, Caltrate, Centrum, trazodone, and Serevent and Combivent inhalers.  On October 14, 2001, she was given [employer] to use for pain, and started on Cipro 500 b.i.d. pending her culture report.  PAST MEDICAL HISTORY: 1. Asthma and bronchitis, treated by Dr. Alwyn Ren. 2. In 1998, she had cataract surgery.  FAMILY HISTORY:  Positive for hypertension and heart disease and leukemia. Mother and father both died about age 73 of heart problems.  She has four sisters and one brother.  SOCIAL HISTORY:  She has one child.  She is married.  She is retired.  She has a 30-year one pack per day history of smoking, quit about three years ago. Does not abuse alcohol.  REVIEW OF SYSTEMS:  General health is good.  Weight has been stable.  HEENT: Unremarkable.  CARDIORESPIRATORY:  She does have shortness of breath, asthma, and bronchitis, but denies any chest pain, heart attack.  GASTROINTESTINAL: No hepatitis or peptic ulcer disease.  Denies heartburn or bowel problems. EXTREMITIES:  No arthritis,  no swelling of the feet.  NEUROPSYCHIATRIC: Denies any stroke or headaches.  SKIN:  No rashes.  LYMPHATIC/HEMATOPOIETIC: No anemia or lumps or bumps.  PHYSICAL EXAMINATION:  GENERAL:  Well-developed, well-nourished 77 year old female.  VITAL SIGNS:  Temperature 98.3, pulse 103, respirations 20, blood pressure 150/87.  HEENT:  Ears and tympanic membranes unremarkable.  Eyes react normally to light and accommodation.  Extraocular movements intact.  Pharynx benign. Teeth in good  repair.  NECK:  No enlargement of thyroid or nodes palpable.  CHEST:  Clear to percussion and auscultation.  HEART:  No murmur, but there is an occasional irregular beat.  ABDOMEN:  Flat.  Liver, kidney, spleen, masses, tenderness, hernia not demonstrated today, but she states she was tender in the left flank when she was having the pain last week.  PELVIC:  External genitalia normal.  The pelvic examination was deferred and will be done at cystoscopy.  EXTREMITIES:  No edema.  Fairly good pulses.  NEUROLOGIC:  Grossly normal reflexes and sensation.  LYMPHATIC:  No nodes.  SKIN:  No lesions.  DIAGNOSES: 1. Grade 1-2 hydronephrosis secondary to suspected L3 stone. 2. Bilateral renal calculi dating back to before 1991. 3. History of urinary tract infection in 1970s. 4. Asthma and bronchitis.  PLAN: 1. Will get a CT scan to confirm the L3 stone on the left, and then proceed with either cystoscopy, stent, and ESWL or, if the stone has migrated to the lower ureter, ureteroscopy and holmium laser fragmentation and stenting. While awaiting on her culture will give her Cipro 500 b.i.d. for prophylaxis against infection.  She has Mepergan Fortis 1 q.4h. p.r.n. for pain. Dictated by:   Radene Knee., M.D. Attending Physician:  Dalbert Mayotte DD:  10/14/01 TD:  10/16/01 Job: [id number]

## 2010-09-13 NOTE — Telephone Encounter (Signed)

## 2010-09-13 NOTE — Op Note (Signed)
NAMECALIANN, LECKRONE NO.:  [id number]   MEDICAL RECORD NO.:  [id number]          PATIENT TYPE:  AMB   LOCATION:  SDS                          FACILITY:  [facility]   PHYSICIAN:  Artist Pais. Weingold, M.D.DATE OF BIRTH:  08/16/1932   DATE OF PROCEDURE:  02/17/2005  DATE OF DISCHARGE:                                 OPERATIVE REPORT   PREOPERATIVE DIAGNOSIS:  Left thumb carpometacarpal arthritis and left long  finger stenosing tenosynovitis.   POSTOPERATIVE DIAGNOSIS:  Left thumb carpometacarpal arthritis and left long  finger stenosing tenosynovitis.   PROCEDURE:  Left long finger carpometacarpal arthroplasty with Retelon  spacer and through a separate incision left long finger A1 pulley release.   SURGEON:  Artist Pais. Mina Marble, M.D.   ASSISTANT:  None.   ANESTHESIA:  Axillary block.   TOURNIQUET TIME:  48 minutes.   COMPLICATIONS:  None.   OPERATIVE REPORT:  The patient was taken to the operating room.  After the  induction of adequate regional anesthetic, the left upper extremity was  prepped and draped in the usual sterile fashion.  An Esmarch was used to  exsanguinate the limb.  The tourniquet was then inflated to 250 mmHg.  At  this point in time, a transverse incision was made in the palmar aspect of  the left hand in the area of the A1 pulley.  The incision was taken down  through the skin and subcutaneous tissues.  The palmar fascia was identified  and split.  The A1 pulley was identified.  The neurovascular bundles were  carefully retracted and the A1 pulley was split with a 15 blade.  The FDS  and FDP tendons were lysed of all adhesions.  The wound was then thoroughly  irrigated and this was loosely closed with 4-0 nylon in two horizontal  mattress sutures.  At this point in time, a second incision was made over  the dorsal radial aspect of the left wrist centered over the [facility] joint and  extended proximally in the area of the first dorsal  compartment.  The skin  was incised.  Care was taken to identify and retract branches of the  superficial radial nerve and the snuff box artery.  The capsule overlying  the [facility] joint was then incised and a dorsally based flap was then elevated.  After the [facility] joint was exposed, a rongeur was used to decorticate the  dorsal aspect of the base of the thumb metacarpal and the trapezium down to  good cancellous bone.  Using a small sagittal saw, the very edge of the  trapezial articular surface was removed.  The wound was then thoroughly  irrigated.  At this point in time, the Retelon spacer was placed and secured  to the base of the thumb and trapezium using 2 mm mini-fragment screws, the  14 mm length in the thumb metacarpal and the 16 in the trapezium.  At this  point in time, through the same second incision, a first dorsal compartment  release was performed due to significant stenosing tenosynovitis of the  wrist, as well.  The wounds were  thoroughly irrigated and the [facility] joint area  of the capsule was closed with 4-0  Mersilene and the skin with running 3-0 Prolene subcuticular stitch.  At  this point in time, Steri-Strips, 4 by 4s, fluffs, and a compressive  dressing as well as a radial volar splint was applied.  The patient  tolerated the procedure well.      Artist Pais Mina Marble, M.D.  Electronically Signed     MAW/MEDQ  D:  02/17/2005  T:  02/17/2005  Job:  [id number]

## 2010-10-21 ENCOUNTER — Other Ambulatory Visit: Payer: Self-pay | Admitting: Internal Medicine

## 2010-10-22 ENCOUNTER — Other Ambulatory Visit: Payer: Self-pay | Admitting: Internal Medicine

## 2010-10-22 NOTE — Telephone Encounter (Signed)
Refill sent.

## 2010-10-29 ENCOUNTER — Ambulatory Visit: Payer: Medicare Other | Admitting: Gastroenterology

## 2010-11-22 ENCOUNTER — Other Ambulatory Visit: Payer: Self-pay | Admitting: Internal Medicine

## 2010-11-22 NOTE — Telephone Encounter (Signed)
Lipid/Hep 272.4/995.20  

## 2010-11-25 ENCOUNTER — Telehealth: Payer: Self-pay | Admitting: Internal Medicine

## 2010-11-25 NOTE — Telephone Encounter (Signed)
No other labs needed at this time.

## 2010-11-25 NOTE — Telephone Encounter (Signed)
Patient picked up rx for simvastatin & was told labs due - she has appt [id number] at [facility] for free t3 & t4, tsh - per lab [phone] she is to have lipid,alt,ast - is there any other labs she needsp

## 2010-12-18 ENCOUNTER — Other Ambulatory Visit: Payer: Self-pay | Admitting: Internal Medicine

## 2010-12-19 ENCOUNTER — Encounter: Payer: Self-pay | Admitting: Internal Medicine

## 2010-12-19 ENCOUNTER — Ambulatory Visit (INDEPENDENT_AMBULATORY_CARE_PROVIDER_SITE_OTHER): Payer: Medicare Other | Admitting: Internal Medicine

## 2010-12-19 VITALS — BP 120/74 | HR 106 | Temp 98.8°F | Wt 102.0 lb

## 2010-12-19 DIAGNOSIS — R002 Palpitations: Secondary | ICD-10-CM

## 2010-12-19 DIAGNOSIS — J209 Acute bronchitis, unspecified: Secondary | ICD-10-CM

## 2010-12-19 DIAGNOSIS — R5383 Other fatigue: Secondary | ICD-10-CM

## 2010-12-19 DIAGNOSIS — R5381 Other malaise: Secondary | ICD-10-CM

## 2010-12-19 LAB — CBC WITH DIFFERENTIAL/PLATELET
Basophils Absolute: 0 10*3/uL (ref 0.0–0.1)
Basophils Relative: 0.3 % (ref 0.0–3.0)
Eosinophils Absolute: 0.1 10*3/uL (ref 0.0–0.7)
Eosinophils Relative: 1.1 % (ref 0.0–5.0)
HCT: 38.3 % (ref 36.0–46.0)
Hemoglobin: 12.8 g/dL (ref 12.0–15.0)
Lymphocytes Relative: 26.5 % (ref 12.0–46.0)
Lymphs Abs: 2.3 10*3/uL (ref 0.7–4.0)
MCHC: 33.5 g/dL (ref 30.0–36.0)
MCV: 95.7 fl (ref 78.0–100.0)
Monocytes Absolute: 0.8 10*3/uL (ref 0.1–1.0)
Monocytes Relative: 9.2 % (ref 3.0–12.0)
Neutro Abs: 5.4 10*3/uL (ref 1.4–7.7)
Neutrophils Relative %: 62.9 % (ref 43.0–77.0)
Platelets: 358 10*3/uL (ref 150.0–400.0)
RBC: 4 Mil/uL (ref 3.87–5.11)
RDW: 13 % (ref 11.5–14.6)
WBC: 8.6 10*3/uL (ref 4.5–10.5)

## 2010-12-19 LAB — BASIC METABOLIC PANEL WITH GFR
BUN: 18 mg/dL (ref 6–23)
CO2: 30 meq/L (ref 19–32)
Calcium: 10 mg/dL (ref 8.4–10.5)
Chloride: 101 meq/L (ref 96–112)
Creatinine, Ser: 1 mg/dL (ref 0.4–1.2)
GFR: 54.77 mL/min — ABNORMAL LOW
Glucose, Bld: 81 mg/dL (ref 70–99)
Potassium: 4.5 meq/L (ref 3.5–5.1)
Sodium: 139 meq/L (ref 135–145)

## 2010-12-19 LAB — ALT: ALT: 19 U/L (ref 0–35)

## 2010-12-19 LAB — TSH: TSH: 0.57 u[IU]/mL (ref 0.35–5.50)

## 2010-12-19 MED ORDER — AZITHROMYCIN 250 MG PO TABS: ORAL_TABLET | ORAL | Status: AC

## 2010-12-19 NOTE — Patient Instructions (Addendum)
Plain Mucinex for thick secretions ;force NON dairy fluids for next 48 hrs. Use a Neti pot daily as needed for sinus congestion  To prevent palpitations or premature beats, avoid stimulants such as decongestants, diet pills, nicotine, or caffeine (coffee, tea, cola, or chocolate) to excess.

## 2010-12-19 NOTE — Progress Notes (Signed)
  Subjective:    Patient ID: Amber Snow, female    DOB: 11-28-1932, 77 y.o.   MRN: [id number]  HPI  she began to notice fatigue on 8/18; she is also noted a cough with scant nonpurulent secretions.She denies nasal congestion/obstruction; nasal purulence; facial pain; anosmia;  fever; headache; halitosis; earache and dental pain. She also denies abdominal pain, melena, rectal bleeding,  diarrhea, dysuria, hematuria, or pyuria..   Review of Systems     Objective:   Physical Exam she is in no acute distress; she is thin but well-nourished  She has no scleral icterus or conjunctival inflammation.  Nares are patent without lesions or exudates  Oropharynx reveals no exudates or erythema; oral  hygiene is good.  She wears hearing aids bilaterally. Tympanic membranes are dull but there is no sign of inflammation  She has no lymphadenopathy the head, neck or axilla  The thyroid is slightly asymmetric; right lobe is small  There is no increased work of breathing her breath sounds are decreased. She has no rales, rhonchi, or wheezing  A flow murmur is present; the heart sounds are distant. The rhythm appears to be irregular.  She has no abdominal tenderness organomegaly or masses.  Deep tendon reflexes are 0-1/2+ at the knees  No clubbing, edema or cyanosis are noted.  Skin is warm and dry without tenting or jaundice.        Assessment & Plan:  #1 fatigue  #2 bronchitis, acute  #3 possible dysrhythmia, rule out atrial fibrillation  Plan see orders and recommendations. EKG: PAC, LBBB. AF not present

## 2010-12-26 ENCOUNTER — Telehealth: Payer: Self-pay | Admitting: Internal Medicine

## 2010-12-26 NOTE — Telephone Encounter (Signed)
Spoke with patient, discussed Dr.Hopper's comments about labs, informed patient copy mailed on 12/23/10.

## 2011-01-02 ENCOUNTER — Telehealth: Payer: Self-pay

## 2011-01-02 NOTE — Telephone Encounter (Signed)
Pt called and stated she had not received her lab work yet and it had been 2 weeks.  Pt was informed that lab work was mailed out previously and would be mailed again.  Pt's address verified.  Offered pt the option to pick up labs but pt declined.

## 2011-01-03 ENCOUNTER — Other Ambulatory Visit: Payer: Self-pay | Admitting: *Deleted

## 2011-01-03 ENCOUNTER — Other Ambulatory Visit (INDEPENDENT_AMBULATORY_CARE_PROVIDER_SITE_OTHER): Payer: Medicare Other

## 2011-01-03 ENCOUNTER — Other Ambulatory Visit: Payer: Self-pay | Admitting: Internal Medicine

## 2011-01-03 DIAGNOSIS — R946 Abnormal results of thyroid function studies: Secondary | ICD-10-CM

## 2011-01-03 LAB — LIPID PANEL
Cholesterol: 174 mg/dL (ref 0–200)
HDL: 64.9 mg/dL (ref >39.00)
LDL Cholesterol: 82 mg/dL (ref 0–99)
Total CHOL/HDL Ratio: 3
Triglycerides: 134 mg/dL (ref 0.0–149.0)

## 2011-01-03 LAB — T3, FREE: T3, Free: 2.5 pg/mL (ref 2.3–4.2)

## 2011-01-03 LAB — T4, FREE: Free T4: 1.81 ng/dL — ABNORMAL HIGH (ref 0.60–1.60)

## 2011-01-03 LAB — HEPATIC FUNCTION PANEL
AST: 30 U/L (ref 0–37)
Albumin: 4 g/dL (ref 3.5–5.2)
Alkaline Phosphatase: 75 U/L (ref 39–117)
Bilirubin, Direct: 0.1 mg/dL (ref 0.0–0.3)
Total Bilirubin: 0.6 mg/dL (ref 0.3–1.2)

## 2011-01-07 ENCOUNTER — Other Ambulatory Visit: Payer: Medicare Other

## 2011-02-01 ENCOUNTER — Other Ambulatory Visit: Payer: Self-pay | Admitting: Internal Medicine

## 2011-02-07 ENCOUNTER — Ambulatory Visit (INDEPENDENT_AMBULATORY_CARE_PROVIDER_SITE_OTHER): Payer: Medicare Other | Admitting: *Deleted

## 2011-02-07 DIAGNOSIS — Z23 Encounter for immunization: Secondary | ICD-10-CM

## 2011-02-13 LAB — DIFFERENTIAL
Basophils Absolute: 0
Basophils Relative: 0
Eosinophils Relative: 0
Lymphocytes Relative: 11 — ABNORMAL LOW
Neutro Abs: 7.1

## 2011-02-13 LAB — CBC
HCT: 39
MCHC: 34.3
Platelets: 172
RDW: 12.8

## 2011-02-13 LAB — POCT I-STAT CREATININE: Operator id: 151321

## 2011-02-13 LAB — I-STAT 8, (EC8 V) (CONVERTED LAB)
BUN: 18
Bicarbonate: 23.5
Glucose, Bld: 118 — ABNORMAL HIGH
Hemoglobin: 13.9
Sodium: 132 — ABNORMAL LOW
TCO2: 25
pCO2, Ven: 37.1 — ABNORMAL LOW

## 2011-02-19 ENCOUNTER — Other Ambulatory Visit: Payer: Self-pay | Admitting: Internal Medicine

## 2011-02-19 MED ORDER — TIOTROPIUM BROMIDE MONOHYDRATE 18 MCG IN CAPS: ORAL_CAPSULE | RESPIRATORY_TRACT | Status: DC

## 2011-02-19 NOTE — Telephone Encounter (Signed)
rx sent to [facility] 

## 2011-04-11 ENCOUNTER — Other Ambulatory Visit: Payer: Self-pay | Admitting: Internal Medicine

## 2011-04-11 DIAGNOSIS — R946 Abnormal results of thyroid function studies: Secondary | ICD-10-CM

## 2011-04-14 ENCOUNTER — Other Ambulatory Visit (INDEPENDENT_AMBULATORY_CARE_PROVIDER_SITE_OTHER): Payer: Medicare Other

## 2011-04-14 DIAGNOSIS — R946 Abnormal results of thyroid function studies: Secondary | ICD-10-CM

## 2011-04-14 LAB — T3, FREE: T3, Free: 2.9 pg/mL (ref 2.3–4.2)

## 2011-05-19 ENCOUNTER — Other Ambulatory Visit: Payer: Self-pay | Admitting: Internal Medicine

## 2011-05-22 ENCOUNTER — Encounter: Payer: Self-pay | Admitting: Family Medicine

## 2011-05-22 ENCOUNTER — Ambulatory Visit (INDEPENDENT_AMBULATORY_CARE_PROVIDER_SITE_OTHER): Payer: Medicare Other | Admitting: Family Medicine

## 2011-05-22 VITALS — BP 108/74 | HR 72 | Temp 97.6°F | Wt 97.4 lb

## 2011-05-22 DIAGNOSIS — R82998 Other abnormal findings in urine: Secondary | ICD-10-CM

## 2011-05-22 DIAGNOSIS — N23 Unspecified renal colic: Secondary | ICD-10-CM

## 2011-05-22 LAB — POCT URINALYSIS DIPSTICK
Bilirubin, UA: NEGATIVE
Ketones, UA: NEGATIVE
Nitrite, UA: NEGATIVE
pH, UA: 7

## 2011-05-22 MED ORDER — CIPROFLOXACIN HCL 500 MG PO TABS: 500.0000 mg | ORAL_TABLET | Freq: Two times a day (BID) | ORAL | Status: AC

## 2011-05-22 NOTE — Progress Notes (Signed)
  Subjective:    Amber Snow is a 78 y.o. female who presents for evaluation of low back pain. The patient has had recurrent self limited episodes of low back pain in the past. Symptoms have been present for several days and are gradually worsening.  Onset was related to / precipitated by no known injury. The pain is located in the left lumbar area and does not radiate. The pain is described as spasm like and occurs intermittently. She is currently in no pain. Symptoms are exacerbated by nothing in particular. Symptoms are improved by ultram. She has also tried acetaminophen and NSAIDs which provided no symptom relief. She has no other symptoms associated with the back pain. The patient has no "red flag" history indicative of complicated back pain.  The following portions of the patient's history were reviewed and updated as appropriate: allergies, current medications, past family history, past medical history, past social history, past surgical history and problem list.  Review of Systems Pertinent items are noted in HPI.    Objective:   Full range of motion without pain, no tenderness, no spasm, no curvature. Normal reflexes, gait, strength and negative straight-leg raise.    Assessment:    Nonspecific acute low back pain    Plan:    UTI vs kidney stone ----culture urine cipro 500 mg bid  con't ultram prn Strain urine rto or call urology if no relief

## 2011-05-22 NOTE — Patient Instructions (Signed)

## 2011-05-24 LAB — URINE CULTURE

## 2011-05-26 ENCOUNTER — Telehealth: Payer: Self-pay | Admitting: Family Medicine

## 2011-05-26 NOTE — Telephone Encounter (Signed)
Patient is calling, would like the results of her Urine culture performed last week.  Please call patient.

## 2011-05-26 NOTE — Telephone Encounter (Signed)
Patient aware of results.       KP 

## 2011-06-17 ENCOUNTER — Other Ambulatory Visit: Payer: Self-pay | Admitting: Urology

## 2011-06-26 ENCOUNTER — Encounter (HOSPITAL_BASED_OUTPATIENT_CLINIC_OR_DEPARTMENT_OTHER): Payer: Self-pay | Admitting: *Deleted

## 2011-06-26 NOTE — Progress Notes (Signed)
NPO AFTER MN. ARRIVES AT 0715. NEEDS HG . CURRENT EKG 12-19-10 W/ CHART. WILL DO SPIRIVA AM OF SURG. W/ SIP OF WATER AND BRING PROAIR.

## 2011-06-30 ENCOUNTER — Encounter (HOSPITAL_BASED_OUTPATIENT_CLINIC_OR_DEPARTMENT_OTHER): Payer: Self-pay | Admitting: Anesthesiology

## 2011-06-30 NOTE — Anesthesia Preprocedure Evaluation (Addendum)
Anesthesia Evaluation  Patient identified by MRN, date of birth, ID band Patient awake    Reviewed: Allergy & Precautions, H&P , NPO status , Patient's Chart, lab work & pertinent test results  Airway Mallampati: II TM Distance: >3 FB Neck ROM: Full    Dental No notable dental hx.    Pulmonary shortness of breath, COPD COPD inhaler, Current Smoker, former smoker Moderate to severe COPD with insignificant reponse to bronchodilator per Dr. Roxy Cedar note in 2011 breath sounds clear to auscultation  Pulmonary exam normal       Cardiovascular + dysrhythmias Rhythm:Regular Rate:Normal  ECG shows LBBB and PACs. LBBB is chronic.   Neuro/Psych Low back pain. Myalgia and myositis  Neuromuscular disease negative psych ROS   GI/Hepatic negative GI ROS, Neg liver ROS,   Endo/Other  negative endocrine ROS  Renal/GU negative Renal ROS  negative genitourinary   Musculoskeletal negative musculoskeletal ROS (+)   Abdominal   Peds negative pediatric ROS (+)  Hematology negative hematology ROS (+)   Anesthesia Other Findings   Reproductive/Obstetrics negative OB ROS                        Anesthesia Physical Anesthesia Plan  ASA: III  Anesthesia Plan: General   Post-op Pain Management:    Induction: Intravenous  Airway Management Planned: LMA  Additional Equipment:   Intra-op Plan:   Post-operative Plan: Extubation in OR  Informed Consent: I have reviewed the patients History and Physical, chart, labs and discussed the procedure including the risks, benefits and alternatives for the proposed anesthesia with the patient or authorized representative who has indicated his/her understanding and acceptance.   Dental advisory given  Plan Discussed with: CRNA  Anesthesia Plan Comments:         Anesthesia Quick Evaluation

## 2011-06-30 NOTE — H&P (Signed)
History of Present Illness     Amber Snow has not passed a stone.  She still has moderate left flank pain.  CT scan 2 weeks ago had revealed a 3 mm stone in the left posterior and inferior aspect of the bladder near the ureteral orifice.  I wonder if she has a stone crowning at the ureteral orifice.  I advised her to have a cystoscopy to rule that out.  She is willing to proceed. She is known to have bilateral non obstructing renal calculi.  She smokes about 4-5 cigarettes a day and used to smoke more than that.  She has smoked for about 50 years.   Past Medical History Problems  1. History of  Nephrolithiasis V13.01 2. History of  Ureteral Stone 592.1  Surgical History Problems  1. History of  Cataract Surgery Bilateral 2. History of  Hand Surgery Left 3. History of  Lithotripsy 4. History of  Tonsillectomy  Current Meds 1. Caltrate 600+D TABS; Therapy: (Recorded:11Mar2010) to 2. Hydrochlorothiazide 25 MG Oral Tablet; TAKE 1 TABLET BY MOUTH   EVERY DAY; Therapy:  23Oct2009 to (Last Rx:23Oct2009) 3. Hydrocodone-Acetaminophen 5-325 MG Oral Tablet; TAKE 1 TABLET Every  4 hours; Therapy:  31Jan2013 to (Last Rx:31Jan2013) 4. Pravastatin Sodium 40 MG Oral Tablet; Therapy: 09Oct2012 to 5. ProAir HFA 108 (90 Base) MCG/ACT Inhalation Aerosol Solution; Therapy: 22Aug2012 to 6. Spiriva HandiHaler 18 MCG Inhalation Capsule; Therapy: 24Oct2012 to 7. TraZODone HCl 50 MG Oral Tablet; Therapy: 21Jan2013 to  Allergies Medication  1. Boniva KIT 2. Penicillins  Family History Problems  1. Family history of  Family Health Status Number Of Children 1 son 2. Family history of  Father Deceased At Age ____ 3. Paternal history of  Heart Disease V17.49 4. Maternal history of  Hypertension V17.49 5. Family history of  Mother Deceased At Age ____ 6. Family history of  Nephrolithiasis  Social History Problems  1. Caffeine Use 3 to 4 cups 2. Marital History - Currently Married 3. Tobacco Use  305.1 Denied  4. History of  Alcohol Use  Review of Systems     As per HPI. There are no other changes since 12/15/07.   PHYSICAL EXAMINATION. ENT:  Within normal limits Neck: Supple, no cervical adenopathy, no thyromegaly. Chest: Symmetrical.  Lungs fully expanded, clear to percussion and auscultation. Heart: Regular rhythm. Abdomen: Soft, non distended, non tender.  No CVA tenderness.  Kidneys are not palpable.  Bladder not distended.  Bowel sounds: normal. Genitalia: Normal female genitalia. Pelvic: No mass.  Cervix is firm, in the midline, non tender.  No adnexal mass. Extremities: within normal limits.    Results/Data Urine [Data Includes: Last 1 Day]   14Feb2013  COLOR YELLOW   APPEARANCE CLEAR   SPECIFIC GRAVITY 1.015   pH 6.0   GLUCOSE NEG mg/dL  BILIRUBIN NEG   KETONE NEG mg/dL  BLOOD NEG   PROTEIN NEG mg/dL  UROBILINOGEN 0.2 mg/dL  NITRITE NEG   LEUKOCYTE ESTERASE TRACE   SQUAMOUS EPITHELIAL/HPF NONE SEEN   WBC 11-20 WBC/hpf  RBC 0-3 RBC/hpf  BACTERIA RARE   CRYSTALS NONE SEEN   CASTS NONE SEEN    Procedure  Procedure: Cystoscopy  Indication: Bladder stone.  Informed Consent: Risks, benefits, and potential adverse events were discussed and informed consent was obtained from the patient . Specific risks including, but not limited to bleeding, infection, pain, allergic reaction etc. were explained.  Prep: The patient was prepped with betadine.  Anesthesia:. Local anesthesia was administered intraurethrally  with 2% lidocaine jelly.  Procedure Note:  Urethral meatus:. No abnormalities.  Anterior urethra: No abnormalities.  Bladder: Visulization was clear. A solitary tumor was visualized in the bladder. A papillary tumor was seen in the bladder. This tumor was located on the left side, on the posterior aspect, at the base of the bladder.  I could not clearly identify the ureteral orifices. The patient tolerated the procedure well.  Complications: None.     Assessment Assessed  1. Bladder Cancer 188.9 2. Nephrolithiasis Of The Left Kidney 592.0 3. Nephrolithiasis Of The Right Kidney 592.0  Plan Health Maintenance (V70.0)  1. UA With REFLEX  Done: 14Feb2013 02:02PM Urinary Tract Infection (599.0)  2. URINE CULTURE  Done: 14Feb2013   Urine culture.  TURBT.  The procedure, risks, benefits were explained to the patient.  The risks include but are not limited to hemorrhage, infection, bladder injury.  She understands and wishes to proceed.  I advised her to quit smoking.   Signatures  CC: Dr Marga Melnick  Electronically signed by : Su Grand, M.D.; Jun 12 2011  4:10PM

## 2011-07-01 ENCOUNTER — Encounter (HOSPITAL_BASED_OUTPATIENT_CLINIC_OR_DEPARTMENT_OTHER): Payer: Self-pay | Admitting: Anesthesiology

## 2011-07-01 ENCOUNTER — Ambulatory Visit (HOSPITAL_BASED_OUTPATIENT_CLINIC_OR_DEPARTMENT_OTHER)
Admission: RE | Admit: 2011-07-01 | Discharge: 2011-07-01 | Disposition: A | Discharge status: Home / Self Care | Payer: Medicare Other | Source: Ambulatory Visit | Attending: Urology | Admitting: Urology

## 2011-07-01 ENCOUNTER — Encounter (HOSPITAL_BASED_OUTPATIENT_CLINIC_OR_DEPARTMENT_OTHER): Payer: Self-pay | Admitting: *Deleted

## 2011-07-01 ENCOUNTER — Encounter (HOSPITAL_BASED_OUTPATIENT_CLINIC_OR_DEPARTMENT_OTHER)
Admission: RE | Disposition: A | Discharge status: Home / Self Care | Payer: Self-pay | Source: Ambulatory Visit | Attending: Urology

## 2011-07-01 ENCOUNTER — Ambulatory Visit (HOSPITAL_BASED_OUTPATIENT_CLINIC_OR_DEPARTMENT_OTHER): Payer: Medicare Other | Admitting: Anesthesiology

## 2011-07-01 DIAGNOSIS — C679 Malignant neoplasm of bladder, unspecified: Secondary | ICD-10-CM | POA: Insufficient documentation

## 2011-07-01 DIAGNOSIS — Z87442 Personal history of urinary calculi: Secondary | ICD-10-CM | POA: Insufficient documentation

## 2011-07-01 DIAGNOSIS — Z79899 Other long term (current) drug therapy: Secondary | ICD-10-CM | POA: Insufficient documentation

## 2011-07-01 DIAGNOSIS — N210 Calculus in bladder: Secondary | ICD-10-CM | POA: Insufficient documentation

## 2011-07-01 DIAGNOSIS — R1032 Left lower quadrant pain: Secondary | ICD-10-CM | POA: Insufficient documentation

## 2011-07-01 HISTORY — PX: CYSTOSCOPY: SHX5120

## 2011-07-01 HISTORY — PX: TRANSURETHRAL RESECTION OF BLADDER TUMOR: SHX2575

## 2011-07-01 HISTORY — DX: Unspecified osteoarthritis, unspecified site: M19.90

## 2011-07-01 LAB — POCT I-STAT 4, (NA,K, GLUC, HGB,HCT)
Glucose, Bld: 107 mg/dL — ABNORMAL HIGH (ref 70–99)
HCT: 37 % (ref 36.0–46.0)

## 2011-07-01 SURGERY — TURBT (TRANSURETHRAL RESECTION OF BLADDER TUMOR)
Anesthesia: General | Site: Bladder | Wound class: Clean Contaminated

## 2011-07-01 MED ORDER — ONDANSETRON HCL 4 MG/2ML IJ SOLN
INTRAMUSCULAR | Status: DC | PRN
  Administered 2011-07-01: 4 mg via INTRAVENOUS

## 2011-07-01 MED ORDER — DEXAMETHASONE SODIUM PHOSPHATE 4 MG/ML IJ SOLN
INTRAMUSCULAR | Status: DC | PRN
  Administered 2011-07-01: 10 mg via INTRAVENOUS

## 2011-07-01 MED ORDER — LACTATED RINGERS IV SOLN
INTRAVENOUS | Status: DC
  Administered 2011-07-01: 100 mL/h via INTRAVENOUS

## 2011-07-01 MED ORDER — SODIUM CHLORIDE 0.9 % IR SOLN
Status: DC | PRN
  Administered 2011-07-01: 9000 mL

## 2011-07-01 MED ORDER — CIPROFLOXACIN IN D5W 400 MG/200ML IV SOLN
400.0000 mg | INTRAVENOUS | Status: AC
  Administered 2011-07-01: 400 mg via INTRAVENOUS

## 2011-07-01 MED ORDER — PROMETHAZINE HCL 25 MG/ML IJ SOLN: 6.2500 mg | INTRAMUSCULAR | Status: DC | PRN

## 2011-07-01 MED ORDER — FENTANYL CITRATE 0.05 MG/ML IJ SOLN: 25.0000 ug | INTRAMUSCULAR | Status: DC | PRN

## 2011-07-01 MED ORDER — FENTANYL CITRATE 0.05 MG/ML IJ SOLN
INTRAMUSCULAR | Status: DC | PRN
  Administered 2011-07-01: 50 ug via INTRAVENOUS
  Administered 2011-07-01 (×2): 25 ug via INTRAVENOUS
  Administered 2011-07-01: 50 ug via INTRAVENOUS
  Administered 2011-07-01 (×2): 25 ug via INTRAVENOUS

## 2011-07-01 MED ORDER — LIDOCAINE HCL (CARDIAC) 20 MG/ML IV SOLN
INTRAVENOUS | Status: DC | PRN
  Administered 2011-07-01: 80 mg via INTRAVENOUS

## 2011-07-01 MED ORDER — PROPOFOL 10 MG/ML IV EMUL
INTRAVENOUS | Status: DC | PRN
  Administered 2011-07-01: 180 mg via INTRAVENOUS

## 2011-07-01 SURGICAL SUPPLY — 36 items
BAG DRAIN URO-CYSTO SKYTR STRL (DRAIN) ×2 IMPLANT
BAG DRN ANRFLXCHMBR STRAP LEK (BAG) ×1
BAG DRN UROCATH (DRAIN) ×1
BAG URINE DRAINAGE (UROLOGICAL SUPPLIES) IMPLANT
BAG URINE LEG 19OZ MD ST LTX (BAG) ×1 IMPLANT
CANISTER SUCT LVC 12 LTR MEDI- (MISCELLANEOUS) ×1 IMPLANT
CATH FOLEY 2WAY SLVR  5CC 16FR (CATHETERS) ×1
CATH FOLEY 2WAY SLVR  5CC 20FR (CATHETERS)
CATH FOLEY 2WAY SLVR  5CC 22FR (CATHETERS)
CATH FOLEY 2WAY SLVR 5CC 16FR (CATHETERS) IMPLANT
CATH FOLEY 2WAY SLVR 5CC 20FR (CATHETERS) IMPLANT
CATH FOLEY 2WAY SLVR 5CC 22FR (CATHETERS) IMPLANT
CLOTH BEACON ORANGE TIMEOUT ST (SAFETY) ×2 IMPLANT
DRAPE CAMERA CLOSED 9X96 (DRAPES) ×1 IMPLANT
ELECT LOOP HF 26F 30D .35MM (CUTTING LOOP) IMPLANT
ELECT LOOP MED HF 24F 12D CBL (CLIP) ×1 IMPLANT
ELECT REM PT RETURN 9FT ADLT (ELECTROSURGICAL) ×2
ELECTRODE REM PT RTRN 9FT ADLT (ELECTROSURGICAL) ×1 IMPLANT
EVACUATOR MICROVAS BLADDER (UROLOGICAL SUPPLIES) ×2 IMPLANT
GLOVE BIO SURGEON STRL SZ7 (GLOVE) ×2 IMPLANT
GLOVE INDICATOR 6.5 STRL GRN (GLOVE) ×1 IMPLANT
GOWN STRL NON-REIN LRG LVL3 (GOWN DISPOSABLE) ×2 IMPLANT
HOLDER FOLEY CATH W/STRAP (MISCELLANEOUS) IMPLANT
IV NS IRRIG 3000ML ARTHROMATIC (IV SOLUTION) ×3 IMPLANT
KIT ASPIRATION TUBING (SET/KITS/TRAYS/PACK) IMPLANT
LOOP CUTTING 24FR OLYMPUS (CUTTING LOOP) IMPLANT
NDL SAFETY ECLIPSE 18X1.5 (NEEDLE) IMPLANT
NEEDLE HYPO 18GX1.5 SHARP (NEEDLE)
NEEDLE HYPO 22GX1.5 SAFETY (NEEDLE) IMPLANT
NS IRRIG 500ML POUR BTL (IV SOLUTION) ×1 IMPLANT
PACK CYSTOSCOPY (CUSTOM PROCEDURE TRAY) ×2 IMPLANT
PLUG CATH AND CAP STER (CATHETERS) IMPLANT
SET ASPIRATION TUBING (TUBING) IMPLANT
SYR 20CC LL (SYRINGE) IMPLANT
SYRINGE IRR TOOMEY STRL 70CC (SYRINGE) IMPLANT
WATER STERILE IRR 3000ML UROMA (IV SOLUTION) ×1 IMPLANT

## 2011-07-01 NOTE — Anesthesia Postprocedure Evaluation (Signed)
  Anesthesia Post-op Note  Patient: Amber Snow  Procedure(s) Performed: Procedure(s) (LRB): TRANSURETHRAL RESECTION OF BLADDER TUMOR (TURBT) (N/A) CYSTOSCOPY (N/A)  Patient Location: PACU  Anesthesia Type: General  Level of Consciousness: awake and alert   Airway and Oxygen Therapy: Patient Spontanous Breathing  Post-op Pain: mild  Post-op Assessment: Post-op Vital signs reviewed, Patient's Cardiovascular Status Stable, Respiratory Function Stable, Patent Airway and No signs of Nausea or vomiting  Post-op Vital Signs: stable  Complications: No apparent anesthesia complications

## 2011-07-01 NOTE — Anesthesia Procedure Notes (Signed)
Procedure Name: LMA Insertion Date/Time: 07/01/2011 8:35 AM Performed by: Lorrin Jackson Pre-anesthesia Checklist: Patient identified, Emergency Drugs available, Suction available and Patient being monitored Patient Re-evaluated:Patient Re-evaluated prior to inductionOxygen Delivery Method: Circle System Utilized Preoxygenation: Pre-oxygenation with 100% oxygen Intubation Type: IV induction Ventilation: Mask ventilation without difficulty LMA: LMA with gastric port inserted LMA Size: 4.0 Number of attempts: 1 Placement Confirmation: positive ETCO2 Tube secured with: Tape Dental Injury: Teeth and Oropharynx as per pre-operative assessment

## 2011-07-01 NOTE — Op Note (Signed)
Amber Snow is a 78 y.o.   07/01/2011  Pre- Op Diagnosis: Bladder tumor.   Post Op Diagnosis: Same plus bladder calculus.  Procedure done: Cystoscopy, TUR bladder tumor, removal of calculus. Washings for urine cytology.  Surgeon: Wendie Simmer. [name]  Anesthesia: Gen.  Indication: Patient is a 78 years old female who was seen in the office for complaints of left flank and left lower quadrant pain. CT scan revealed a 3 mm stone at the base of the bladder. She had not passed the stone. She had cystoscopy that revealed no calculus in the bladder but 2 papillary tumors in the bladder. The tumor were above and lateral to the left ureteral orifice at the base of the bladder. Patient is scheduled today for cystoscopy TUR bladder tumor.  Procedure: Patient was identified by her wrist band and proper timeout was taken.  Under general anesthesia she was prepped and draped and placed in the dorsolithotomy position. A panendoscope was inserted in the bladder. There are 2 papillary tumors at the base of the bladder. The largest tumor measures about 5 cm and is lateral to the left ureteral orifice. The smaller tumor measures about 2 cm and is above the ureteral orifice. There is no other tumor in the bladder. The ureteral orifices are in normal position and shape. A small stone was found inbedded in the larger tumor. The cystoscope was removed. A gyrus resectoscope was inserted in the bladder. Resection of the larger bladder tumor was done first. Then the smaller tumor was resected care taken not to injure the ureteral orifice. The specimen was then irrigated out of the bladder. Then the base of the larger bladder tumor was resected for staging purposes. The base of the bladder tumor was then placed in a different container and sent to pathology. Hemostasis was secured with electrocautery. The margins of resection of the bladder tumors also fulgurated over a 2 cm radius. There was no evidence of bleeding at the end  of the procedure. The small stone was irrigated out of the bladder. The bladder was then irrigated with normal saline and bladder washings were sent for cytology. The resectoscope was then removed. A #16 French Foley catheter was then inserted in the bladder. The catheter was attached to a leg bag.  The patient tolerated the procedure well and left the OR in satisfactory condition to postanesthesia care unit  EBL: Minimal  CC: Dr. Marga Melnick

## 2011-07-01 NOTE — Transfer of Care (Signed)
Immediate Anesthesia Transfer of Care Note  Patient: Amber Snow  Procedure(s) Performed: Procedure(s) (LRB): TRANSURETHRAL RESECTION OF BLADDER TUMOR (TURBT) (N/A) CYSTOSCOPY (N/A)  Patient Location: Patient transported to PACU with oxygen via face mask at 4 Liters / Min  Anesthesia Type: General  Level of Consciousness: awake and alert   Airway & Oxygen Therapy: Patient Spontanous Breathing and Patient connected to face mask oxygen  Post-op Assessment: Report given to PACU RN and Post -op Vital signs reviewed and stable  Post vital signs: Reviewed and stable  Dentition: Teeth and oropharynx remain in pre-op condition  Complications: No apparent anesthesia complications

## 2011-07-02 ENCOUNTER — Encounter (HOSPITAL_BASED_OUTPATIENT_CLINIC_OR_DEPARTMENT_OTHER): Payer: Self-pay | Admitting: Urology

## 2011-08-07 ENCOUNTER — Encounter: Payer: Self-pay | Admitting: Internal Medicine

## 2011-08-07 ENCOUNTER — Ambulatory Visit (INDEPENDENT_AMBULATORY_CARE_PROVIDER_SITE_OTHER): Payer: Medicare Other | Admitting: Internal Medicine

## 2011-08-07 VITALS — BP 138/70 | HR 94 | Temp 98.5°F | Wt 94.0 lb

## 2011-08-07 DIAGNOSIS — J449 Chronic obstructive pulmonary disease, unspecified: Secondary | ICD-10-CM

## 2011-08-07 DIAGNOSIS — R946 Abnormal results of thyroid function studies: Secondary | ICD-10-CM

## 2011-08-07 DIAGNOSIS — R9431 Abnormal electrocardiogram [ECG] [EKG]: Secondary | ICD-10-CM

## 2011-08-07 DIAGNOSIS — R5383 Other fatigue: Secondary | ICD-10-CM

## 2011-08-07 NOTE — Progress Notes (Signed)
Subjective:    Patient ID: Amber Snow, female    DOB: [birth year], 78 y.o.   MRN: [id number]  HPI Onset: Had kidney stones for a month prior to bladder surgery on March 5 then noticed fatigue post surgery Fatigue @ rest: Pt complains of fatigue at rest. Pt explains that she had done a lot of resting due to the fatigue. Fatigue with exertion: Pt explains she had fatigue throughout the day especially with exertion. Definitely primarily physical fatigue, not motivational  Symptoms: Fever/ chills : Pt denies fever, chills, sweats. Night sweats: Occurred for a few weeks after the surgery but not since. Vision changes (blurred/ double/ loss): Pt denies Hoarseness or swallowing dysfunction: Pt denies Bowel changes ( constipation/ diarrhea): Pt denies any bowel changes. Weight change: Pt denies Exertional chest pain: Pt denies Dyspnea on exertion: Pt becomes short of breath with exertion. Pt explains that this is ongoing. Pt has COPD.  Cough: Pt has occasional cough that is sometimes productive and other times dry. Hemoptysis: pt denies New medications: pt denies any new medications besides antibiotics related to the kidney stones. Leg swelling: pt denies Orthopnea: pt denies PND: pt denies Melena/ rectal bleeding: pt denies Adenopathy: pt denies Severe snoring: pt denies Daytime sleepiness: fatigue lasts all day however it becomes worse as the day goes on. "If I have to do something during the day, I have to do it in the morning because I have no energy at the end of the day." Pt wakes up fatigued. Pt states she thinks she sleeps well besides getting up to use the bathroom frequently. Pt was seen by Urologist yesterday and received sample medications for overactive bladder. Skin / hair / nail changes: pt denies Temperature intolerance( heat/ cold) : Pt feels like she is colder than everyone else Feeling depressed: pt denies Anhedonia (inability to experience pleasure from activities  usually found enjoyable): pt denies Altered appetite: pt states sometimes when she is very fatigued, she does not eat. Pt is unsure how often this occurs but usually occurs during dinner time. Poor sleep/ Apnea: pt denies Abnormal bruising / bleeding or enlarged lymph nodes: pt denies  Her urologist performed CBC and differential and CMET yesterday. Results are pending.  She is smoking 5 cigarettes a day.  PMH/ FH of thyroid disease: Has been checked for hyperthyroidism in the past and pt believes she is due to have TSH checked again in May. Pt states thyroid disease does not run in the family besides she believes a sister may have had hyperthyroidism or hypothyroidism.       Review of Systems   Pt also denies symptoms of rhinosinusitits such as frontal headache, sinus pain and pressure, fever, or nasal purulence. Pt denies headache but does say she becomes lightheaded at times. This occurred more frequently right after surgery but has improved some.                                                         Objective:   Physical Exam Gen.: Thin but adequately nourished in appearance. Alert, appropriate and cooperative throughout exam. Head: Normocephalic without obvious abnormalities Eyes: No corneal or conjunctival inflammation noted.  Extraocular motion intact. No lid lag or proptosis.  Mouth: Oral mucosa and oropharynx reveal no lesions or exudates. Teeth in good repair. Neck: No  deformities, masses, or tenderness noted. Range of motion decreased.Thyroid small Lungs: Low-grade rhonchi which is homogenous and diffuse . increased work of breathing. Heart: Slight tachycardia with gallop cadence. Grade 1.5 systolic murmur at the lower left sternal border with palpable lift Abdomen: Bowel sounds normal; abdomen soft and nontender. No masses, organomegaly or hernias noted.Aorta palpable ; no AAA                                                                  Musculoskeletal/extremities:  No   cyanosis, edema, or deformity noted. .Jints normal. Nail health  Good , but mild clubbing. Vascular: Intact carotid & radial artery pulses . Decreased dorsalis pedis and  posterior tibial pulses  Except R DPP No bruits present. Neurologic: Alert and oriented x3. Deep tendon reflexes symmetrical and normal.          Skin: Intact without suspicious lesions or rashes. Lymph: No cervical, axillary lymphadenopathy present. Psych: Mood and affect are normal. Normally interactive      Initially she was not able to exert enough inspiratory force to trip the Tudorza device                                                                                     Assessment & Plan:  #1 fatigue, multifactorial  #2 exertional dyspnea in the context of advanced COPD  #3 abnormal EKG. There may be progression of the ST-T changes in inferior leads and dictating the fatigue may have some coronary artery disease component  Plan: Smoking cessation was stressed because of the multiple risk, mainly the risk of the acute MI. She will use a sample of the [place]  each day in place of the  Spiriva. Cardiology assessment will be pursued.  If the pending labs are nondiagnostic, full thyroid functions will be completed.

## 2011-08-07 NOTE — Assessment & Plan Note (Signed)
Free T4 was mildly elevated at 1.67 on 04/14/11. TSH and free T3 were normal. These will be repeated if the pending labs are nondiagnostic. I will await those results to prevent multiple blood draws

## 2011-08-07 NOTE — Patient Instructions (Signed)
Consider  [facility]'s smoking cessation program @ [email or web address] or [phone].   Please try to go on My Chart within the next 24 hours to allow me to release the results directly to you.

## 2011-08-11 ENCOUNTER — Other Ambulatory Visit: Payer: Self-pay | Admitting: Internal Medicine

## 2011-08-13 ENCOUNTER — Ambulatory Visit (INDEPENDENT_AMBULATORY_CARE_PROVIDER_SITE_OTHER): Payer: Medicare Other | Admitting: Cardiology

## 2011-08-13 ENCOUNTER — Encounter: Payer: Self-pay | Admitting: Cardiology

## 2011-08-13 VITALS — BP 145/78 | HR 108 | Ht 63.0 in | Wt 96.2 lb

## 2011-08-13 DIAGNOSIS — R0600 Dyspnea, unspecified: Secondary | ICD-10-CM | POA: Insufficient documentation

## 2011-08-13 DIAGNOSIS — I779 Disorder of arteries and arterioles, unspecified: Secondary | ICD-10-CM

## 2011-08-13 DIAGNOSIS — R5381 Other malaise: Secondary | ICD-10-CM

## 2011-08-13 DIAGNOSIS — I739 Peripheral vascular disease, unspecified: Secondary | ICD-10-CM | POA: Insufficient documentation

## 2011-08-13 DIAGNOSIS — R011 Cardiac murmur, unspecified: Secondary | ICD-10-CM | POA: Insufficient documentation

## 2011-08-13 DIAGNOSIS — R5383 Other fatigue: Secondary | ICD-10-CM

## 2011-08-13 DIAGNOSIS — I447 Left bundle-branch block, unspecified: Secondary | ICD-10-CM

## 2011-08-13 NOTE — Assessment & Plan Note (Signed)
Her left bundle branch block appears to be chronic dating at least back to 2009.

## 2011-08-13 NOTE — Assessment & Plan Note (Signed)
She has evidence of peripheral arterial disease with right carotid, abdominal, and femoral bruits. She does have palpable pedal pulses and she does have some leg fatigue with exertion has no significant claudication. We may consider arterial Doppler studies depending on the results of her cardiac evaluation.

## 2011-08-13 NOTE — Patient Instructions (Signed)
We will schedule you for a nuclear stress test and an Echocardiogram  Try and quit smoking completely.

## 2011-08-13 NOTE — Assessment & Plan Note (Signed)
Her dyspnea may be related to her moderate to severe COPD. She has no active bronchospasm at this time. She is at moderate risk for coronary disease with history of hyperlipidemia and evidence of peripheral arterial disease. We will schedule her for a LexiScan Myoview study to evaluate for potential ischemia. Since she has a left bundle branch block a pharmacologic study is most appropriate. I think she would tolerate LexiScan well since she doesn't have active bronchospasm.

## 2011-08-13 NOTE — Progress Notes (Addendum)
Nickola Major Date of Birth: [birth year] Medical Record #[id number]  History of Present Illness: Mrs. Cancro is seen today at the request of Dr. Alwyn Ren for evaluation of fatigue and dyspnea. She is a pleasant 78 year old white female who has a history of COPD and a chronic left bundle branch block. She reports that over the past 2 months she has had more severe fatigue associated with increased shortness of breath over the same period. She denies any significant wheezing and has had only an intermittent mild cough. She denies any significant chest pain or pressure. 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 She states at that time she was evaluated with a stress test. She does have infrequent chest pains which she relates to this problem. Over the past 3 months she has not been exercising significantly. She continues to smoke but has cut down to 3-4 cigarettes per day. She denies a history of hypertension or diabetes. She does have a history of hyperlipidemia.  Current Outpatient Prescriptions on File Prior to Visit  Medication Sig Dispense Refill  . Ascorbic Acid (VITAMIN C) 1000 MG tablet Take 1,000 mg by mouth daily.       Marland Kitchen BIOTIN PO Take 1 capsule by mouth daily.      . Calcium Carbonate (CALTRATE 600) 1500 MG TABS Take by mouth daily. 2 1/2 tab by mouth daily      . Cholecalciferol (VITAMIN D) 1000 UNITS capsule Take 1,000 Units by mouth daily.       . Multiple Vitamin (MULTIVITAMIN) capsule Take 1 capsule by mouth daily.       . pravastatin (PRAVACHOL) 40 MG tablet TAKE 1 TABLET AT BEDTIME  30 tablet  11  . PROAIR HFA 108 (90 BASE) MCG/ACT inhaler INHALE 2 PUFFS EVERY 4 HOURS AS NEEDED FOR COUGH AND CONGESTION  1 Inhaler  5  . tiotropium (SPIRIVA) 18 MCG inhalation capsule Place 18 mcg into inhaler and inhale every morning. 1 inhalation daily      . traZODone (DESYREL) 50 MG tablet TAKE 1 TABLET EVERY DAY  90 tablet  0  . vitamin E 400 UNIT capsule Take 400 Units by  mouth daily.       Marland Kitchen DISCONTD: traZODone (DESYREL) 50 MG tablet Take 50 mg by mouth at bedtime.         Allergies  Allergen Reactions  . Shrimp (Shellfish Allergy) Swelling  . Aspirin     REACTION: nausea  . Barbiturates     REACTION: rash  . Beta Adrenergic Blockers     PT STATES TOLD TO AVOID DUE TO COUGH REACTION TO CARDIZEM  . Calcium Channel Blockers     PER PT TOLD TO AVOID DUE TO COUGH REACTION TO CARDIZEM  . Celecoxib     REACTION: nausea  . Codeine     REACTION: excessive sleep  . Diltiazem Hcl Other (See Comments)    REACTION: cough  . Fluvastatin Sodium     REACTION: intolerance  . Ibandronate Sodium Other (See Comments)    REACTION: HAIR LOSS/NAIL CHANGES  . Rosuvastatin     REACTION: intolerance  . Simvastatin     REACTION: intolerance  . Penicillins Rash    Past Medical History  Diagnosis Date  . Carotid bruit     Right  . Diverticulosis of colon   . Hyperlipidemia   . History of nephrolithiasis   . Osteopenia   . COPD, moderate   . Cough   . Skipped  beats   . SOB (shortness of breath) on exertion   . Arthritis HANDS    Past Surgical History  Procedure Date  . Cosmetic surgery     eyelids  . Left long finger arthroplasty & pulley relase 02-17-2005  . Cysto/ left retrograde pyelogram/ left ureteral stent placement 10-16-2001    STONE  . Pulmonary function analysis 07-31-2009    MODERATE TO SEVERE OBSTRUCTIVE AIRWAY DISEASE/ INSIGNIFICANT RESPONSE TO BRONCHODILATORS/ EMPHYSEMA PATTERN/ MILD DECREASE IN DIFFUSE CAPACITY FEF 25-75 CHANGED BY 8%  . Tubal ligation AGE 63'S  . Tonsillectomy CHILD  . Extracorporeal shock wave lithotripsy 2003  . Cataract extraction w/ intraocular lens  implant, bilateral 1998  . Transurethral resection of bladder tumor 07/01/2011    Procedure: TRANSURETHRAL RESECTION OF BLADDER TUMOR (TURBT);  Surgeon: Lindaann Slough, MD;  [facility];  Service: Urology;  Laterality: N/A;  CYSTO  . Cystoscopy  07/01/2011    Procedure: CYSTOSCOPY;  Surgeon: Lindaann Slough, MD;  Location: [facility];  Service: Urology;  Laterality: N/A;  retrieval l of bladder stone    History  Smoking status  . Current Some Day Smoker -- 0.2 packs/day  . Types: Cigarettes  . Last Attempt to Quit: 04/28/2009  Smokeless tobacco  . Never Used    History  Alcohol Use No    Family History  Problem Relation Age of Onset  . Heart disease Mother   . Hypertension Mother   . Hypertension Father   . Heart attack Father   . Heart disease Father   . Thyroid disease Sister   . Heart attack Sister   . Stroke Sister   . Heart attack Brother   . Cancer Maternal Grandmother     breast cancer  . Diabetes Paternal Grandmother     Review of Systems: The review of systems is positive for history of right carotid bruit.  She has no symptoms of TIA or stroke. She does get fatigue in her legs with walking but denies any pain. She has been intolerant to multiple statins in the past but has done fairly well on pravastatin. In mid-January she had a kidney stone. All other systems were reviewed and are negative.  Physical Exam: BP 145/78  Pulse 108  Ht 5\' 3"  (1.6 m)  Wt 43.636 kg (96 lb 3.2 oz)  BMI 17.04 kg/m2 She is an elderly, thin white female in no acute distress. She is normocephalic, atraumatic. Pupils are equal round and reactive to light and accommodation. Oropharynx is clear. Neck is supple without JVD, adenopathy, or thyromegaly. Chest is soft right carotid bruit. Lungs reveal diminished breath sounds without active wheezing or rhonchi. Cardiac exam reveals a regular rate and rhythm with a grade 1-2/6 systolic murmur at the apex. Abdomen is thin, soft, nontender. She has midline abdominal bruits. There is no mass. Extremities reveal right femoral bruit. Femoral pulses are 2+ and symmetric. Dorsalis pedis pulses are 1+ and symmetric. She has no phlebitis or edema. Skin is warm and dry. She is alert and  oriented x3. Cranial nerves II through XII are intact. LABORATORY DATA: ECG gated 08/07/2011 shows normal sinus rhythm with a left bundle branch block.  Assessment / Plan:   To summarize this patients situation. She is a 78 yo WF who presents with symptoms of worsening dyspnea and atypical chest pain. She has a LBBB. She has multiple risk factors including hyperlipidemia, tobacco abuse, strong family history of CAD, and PAD. Recent myoview study demonstrates a  fixed anteroseptal and apical defect. Her ejection fraction is severely reduced at 27% which is significantly worse than prior study in 2003. Right and left heart cath is indicated to assess her hemodynamics and to evaluate extent of coronary artery disease in order to guide her optimal therapy.  Stefhanie Kachmar [place] MD, [facility] 09/08/2011 11:47 AM

## 2011-08-13 NOTE — Assessment & Plan Note (Signed)
She has a murmur on exam is consistent with mitral insufficiency. We will obtain an echocardiogram to assess her LV function and valve pathology.

## 2011-08-28 ENCOUNTER — Other Ambulatory Visit: Payer: Self-pay

## 2011-08-28 ENCOUNTER — Ambulatory Visit (HOSPITAL_COMMUNITY): Payer: Medicare Other | Attending: Cardiology

## 2011-08-28 DIAGNOSIS — R0989 Other specified symptoms and signs involving the circulatory and respiratory systems: Secondary | ICD-10-CM | POA: Insufficient documentation

## 2011-08-28 DIAGNOSIS — R0600 Dyspnea, unspecified: Secondary | ICD-10-CM

## 2011-08-28 DIAGNOSIS — R0602 Shortness of breath: Secondary | ICD-10-CM

## 2011-08-28 DIAGNOSIS — R0609 Other forms of dyspnea: Secondary | ICD-10-CM | POA: Insufficient documentation

## 2011-08-28 DIAGNOSIS — E785 Hyperlipidemia, unspecified: Secondary | ICD-10-CM | POA: Insufficient documentation

## 2011-08-28 DIAGNOSIS — I447 Left bundle-branch block, unspecified: Secondary | ICD-10-CM | POA: Insufficient documentation

## 2011-08-28 DIAGNOSIS — J4489 Other specified chronic obstructive pulmonary disease: Secondary | ICD-10-CM | POA: Insufficient documentation

## 2011-08-28 DIAGNOSIS — I739 Peripheral vascular disease, unspecified: Secondary | ICD-10-CM

## 2011-08-28 DIAGNOSIS — R5383 Other fatigue: Secondary | ICD-10-CM

## 2011-08-28 DIAGNOSIS — I079 Rheumatic tricuspid valve disease, unspecified: Secondary | ICD-10-CM | POA: Insufficient documentation

## 2011-08-28 DIAGNOSIS — F17200 Nicotine dependence, unspecified, uncomplicated: Secondary | ICD-10-CM | POA: Insufficient documentation

## 2011-08-28 DIAGNOSIS — J449 Chronic obstructive pulmonary disease, unspecified: Secondary | ICD-10-CM | POA: Insufficient documentation

## 2011-08-29 ENCOUNTER — Other Ambulatory Visit: Payer: Self-pay

## 2011-08-29 MED ORDER — LOSARTAN POTASSIUM 50 MG PO TABS: 50.0000 mg | ORAL_TABLET | Freq: Every day | ORAL | Status: DC

## 2011-09-01 ENCOUNTER — Other Ambulatory Visit (HOSPITAL_COMMUNITY): Payer: Medicare Other

## 2011-09-01 ENCOUNTER — Ambulatory Visit (HOSPITAL_COMMUNITY): Payer: Medicare Other | Attending: Internal Medicine | Admitting: Radiology

## 2011-09-01 VITALS — BP 128/70 | Ht 63.0 in | Wt 97.0 lb

## 2011-09-01 DIAGNOSIS — R0609 Other forms of dyspnea: Secondary | ICD-10-CM | POA: Insufficient documentation

## 2011-09-01 DIAGNOSIS — R079 Chest pain, unspecified: Secondary | ICD-10-CM

## 2011-09-01 DIAGNOSIS — R0989 Other specified symptoms and signs involving the circulatory and respiratory systems: Secondary | ICD-10-CM | POA: Insufficient documentation

## 2011-09-01 DIAGNOSIS — R5383 Other fatigue: Secondary | ICD-10-CM | POA: Insufficient documentation

## 2011-09-01 DIAGNOSIS — R002 Palpitations: Secondary | ICD-10-CM | POA: Insufficient documentation

## 2011-09-01 DIAGNOSIS — Z8249 Family history of ischemic heart disease and other diseases of the circulatory system: Secondary | ICD-10-CM | POA: Insufficient documentation

## 2011-09-01 DIAGNOSIS — R0600 Dyspnea, unspecified: Secondary | ICD-10-CM

## 2011-09-01 DIAGNOSIS — R000 Tachycardia, unspecified: Secondary | ICD-10-CM | POA: Insufficient documentation

## 2011-09-01 DIAGNOSIS — R61 Generalized hyperhidrosis: Secondary | ICD-10-CM | POA: Insufficient documentation

## 2011-09-01 DIAGNOSIS — I739 Peripheral vascular disease, unspecified: Secondary | ICD-10-CM

## 2011-09-01 DIAGNOSIS — R0789 Other chest pain: Secondary | ICD-10-CM | POA: Insufficient documentation

## 2011-09-01 DIAGNOSIS — F17200 Nicotine dependence, unspecified, uncomplicated: Secondary | ICD-10-CM | POA: Insufficient documentation

## 2011-09-01 DIAGNOSIS — E785 Hyperlipidemia, unspecified: Secondary | ICD-10-CM | POA: Insufficient documentation

## 2011-09-01 DIAGNOSIS — I2510 Atherosclerotic heart disease of native coronary artery without angina pectoris: Secondary | ICD-10-CM | POA: Insufficient documentation

## 2011-09-01 DIAGNOSIS — R5381 Other malaise: Secondary | ICD-10-CM | POA: Insufficient documentation

## 2011-09-01 DIAGNOSIS — I447 Left bundle-branch block, unspecified: Secondary | ICD-10-CM | POA: Insufficient documentation

## 2011-09-01 DIAGNOSIS — R0602 Shortness of breath: Secondary | ICD-10-CM | POA: Insufficient documentation

## 2011-09-01 MED ORDER — TECHNETIUM TC 99M TETROFOSMIN IV KIT
33.0000 | PACK | Freq: Once | INTRAVENOUS | Status: AC | PRN
  Administered 2011-09-01: 33 via INTRAVENOUS

## 2011-09-01 MED ORDER — ADENOSINE (DIAGNOSTIC) 3 MG/ML IV SOLN
0.5600 mg/kg | Freq: Once | INTRAVENOUS | Status: AC
  Administered 2011-09-01: 24.6 mg via INTRAVENOUS

## 2011-09-01 MED ORDER — TECHNETIUM TC 99M TETROFOSMIN IV KIT
11.0000 | PACK | Freq: Once | INTRAVENOUS | Status: AC | PRN
  Administered 2011-09-01: 11 via INTRAVENOUS

## 2011-09-01 NOTE — Progress Notes (Signed)
[facility] SITE 3 NUCLEAR MED [address] [place] [address] [phone]  Cardiology Nuclear Med Study  Amber Snow is a 78 y.o. female     MRN : [id number]     DOB: 1933/04/18  Procedure Date: 09/01/2011  Nuclear Med Background Indication for Stress Test:  Evaluation for Ischemia History:  '03 MPS:No ischemia, small septal defect, EF=48%. Cardiac Risk Factors: Family History - CAD, LBBB, Lipids, PVD and Smoker  Symptoms:  Chest Pressure.  (last date of chest discomfort was about 2-weeks ago), Diaphoresis, DOE, Rapid HR and SOB   Nuclear Pre-Procedure Caffeine/Decaff Intake:  8:00pm NPO After: 8:00pm   Lungs:  clear O2 Sat: 98% on room air. IV 0.9% NS with Angio Cath:  22g  IV Site: R Wrist  IV Started by:  Cathlyn Parsons, RN  Chest Size (in):  34 Cup Size: C  Height: 5\' 3"  (1.6 m)  Weight:  97 lb (43.999 kg)  BMI:  Body mass index is 17.18 kg/(m^2). Tech Comments:  n/a    Nuclear Med Study 1 or 2 day study: 1 day  Stress Test Type:  Adenosine  Reading MD: Dietrich Pates, MD  Order Authorizing Provider:  Peter [place], MD  Resting Radionuclide: Technetium 22m Tetrofosmin  Resting Radionuclide Dose: 10.2 mCi   Stress Radionuclide:  Technetium 59m Tetrofosmin  Stress Radionuclide Dose: 30.9 mCi           Stress Protocol Rest HR: 79 Stress HR: 95  Rest BP: 128/70 Stress BP: 142/70  Exercise Time (min): n/a METS: n/a   Predicted Max HR: 144 bpm % Max HR: 65.28 bpm Rate Pressure Product: 84132   Dose of Adenosine (mg):  24.7 Dose of Lexiscan: n/a mg  Dose of Atropine (mg): n/a Dose of Dobutamine: n/a mcg/kg/min (at max HR)  Stress Test Technologist: Smiley Houseman, CMA-N  Nuclear Technologist:  Domenic Polite, CNMT     Rest Procedure:  Myocardial perfusion imaging was performed at rest 45 minutes following the intravenous administration of Technetium 70m Tetrofosmin.  Rest GMW:NUUV  Stress Procedure:  The patient received IV adenosine at  140 mcg/kg/min for 4 minutes.  There were episodes of 2nd and 3rd degree AVB  with infusion, associated with chest pressure and lightheadedness.  Technetium 58m Tetrofosmin was injected at the 2 minute mark and quantitative spect images were obtained after a 45 minute delay.  Stress ECG: Uninteretable due to baseline LBBB  QPS Raw Data Images:  Soft tissue (diaphragm, bowel activity) underlies heart. Stress Images:  Defect in distal anteroseptal wall and apex.  Otherwise normal perfusion. Rest Images:  No significant change from the stress images. Subtraction (SDS):  No evidence of ischemia. Transient Ischemic Dilatation (Normal <1.22):  1.11 Lung/Heart Ratio (Normal <0.45):  0.26  Quantitative Gated Spect Images QGS EDV:  111 ml QGS ESV:  81 ml  Impression Exercise Capacity:  Adenosine study with no exercise. BP Response:  Normal blood pressure response. Clinical Symptoms:  Mild chest pressure. ECG Impression:  Baseline:  LBBB.  EKG uninterpretable due to LBBB at rest and stress. Comparison with Prior Nuclear Study: No ischemia noted.  LVEF was better at 48%.}  Overall Impression:  Small region of distal anteroseptal and apical scar.  Otherwise normal perfusion.  No ischemia.  LV Ejection Fraction: 27%.  LV Wall Motion:  Diffuse hypokinesis; apical akinesis.  Dietrich Pates

## 2011-09-02 ENCOUNTER — Telehealth: Payer: Self-pay

## 2011-09-02 NOTE — Telephone Encounter (Signed)
Patient called was told adenosine myoview done 09/01/11 revealed low EF.Spoke to Norma Fredrickson NP she advised needs to see patient next wed 09/10/11 when Dr.Jordan is in office.Appointment scheduled with Lawson Fiscal 09/10/11 at 3:15 pm.

## 2011-09-03 ENCOUNTER — Other Ambulatory Visit: Payer: Self-pay

## 2011-09-03 DIAGNOSIS — R5383 Other fatigue: Secondary | ICD-10-CM

## 2011-09-03 DIAGNOSIS — R0602 Shortness of breath: Secondary | ICD-10-CM

## 2011-09-03 DIAGNOSIS — I447 Left bundle-branch block, unspecified: Secondary | ICD-10-CM

## 2011-09-04 ENCOUNTER — Other Ambulatory Visit: Payer: Self-pay

## 2011-09-04 ENCOUNTER — Ambulatory Visit (INDEPENDENT_AMBULATORY_CARE_PROVIDER_SITE_OTHER): Payer: Medicare Other | Admitting: *Deleted

## 2011-09-04 ENCOUNTER — Ambulatory Visit
Admission: RE | Admit: 2011-09-04 | Discharge: 2011-09-04 | Disposition: A | Discharge status: Home / Self Care | Payer: Medicare Other | Source: Ambulatory Visit | Attending: Cardiology | Admitting: Cardiology

## 2011-09-04 DIAGNOSIS — R946 Abnormal results of thyroid function studies: Secondary | ICD-10-CM

## 2011-09-04 DIAGNOSIS — R0602 Shortness of breath: Secondary | ICD-10-CM

## 2011-09-04 DIAGNOSIS — R5383 Other fatigue: Secondary | ICD-10-CM

## 2011-09-04 DIAGNOSIS — I447 Left bundle-branch block, unspecified: Secondary | ICD-10-CM

## 2011-09-04 DIAGNOSIS — R5381 Other malaise: Secondary | ICD-10-CM

## 2011-09-04 LAB — APTT: aPTT: 26.3 s (ref 21.7–28.8)

## 2011-09-04 LAB — CBC WITH DIFFERENTIAL/PLATELET
Basophils Relative: 0.6 % (ref 0.0–3.0)
Eosinophils Absolute: 0.1 10*3/uL (ref 0.0–0.7)
Eosinophils Relative: 0.7 % (ref 0.0–5.0)
Hemoglobin: 12.4 g/dL (ref 12.0–15.0)
Lymphocytes Relative: 27 % (ref 12.0–46.0)
MCHC: 33.3 g/dL (ref 30.0–36.0)
Monocytes Relative: 11.3 % (ref 3.0–12.0)
Neutro Abs: 4.5 10*3/uL (ref 1.4–7.7)
Neutrophils Relative %: 60.4 % (ref 43.0–77.0)
RBC: 3.88 Mil/uL (ref 3.87–5.11)
WBC: 7.4 10*3/uL (ref 4.5–10.5)

## 2011-09-04 LAB — BASIC METABOLIC PANEL
CO2: 29 mEq/L (ref 19–32)
Calcium: 9.1 mg/dL (ref 8.4–10.5)
Creatinine, Ser: 1 mg/dL (ref 0.4–1.2)
GFR: 60.69 mL/min (ref >60.00)
Sodium: 142 mEq/L (ref 135–145)

## 2011-09-04 LAB — TSH: TSH: 0.98 u[IU]/mL (ref 0.35–5.50)

## 2011-09-04 LAB — T4, FREE: Free T4: 0.94 ng/dL (ref 0.60–1.60)

## 2011-09-04 LAB — PROTIME-INR: INR: 1 ratio (ref 0.8–1.0)

## 2011-09-07 ENCOUNTER — Other Ambulatory Visit: Payer: Self-pay | Admitting: Cardiology

## 2011-09-07 DIAGNOSIS — R0600 Dyspnea, unspecified: Secondary | ICD-10-CM

## 2011-09-09 ENCOUNTER — Encounter (HOSPITAL_BASED_OUTPATIENT_CLINIC_OR_DEPARTMENT_OTHER): Admission: RE | Payer: Self-pay | Source: Ambulatory Visit

## 2011-09-09 ENCOUNTER — Inpatient Hospital Stay (HOSPITAL_BASED_OUTPATIENT_CLINIC_OR_DEPARTMENT_OTHER): Admission: RE | Admit: 2011-09-09 | Payer: Medicare Other | Source: Ambulatory Visit | Admitting: Cardiology

## 2011-09-09 SURGERY — JV LEFT AND RIGHT HEART CATHETERIZATION WITH CORONARY ANGIOGRAM
Anesthesia: Moderate Sedation

## 2011-09-10 ENCOUNTER — Telehealth: Payer: Self-pay | Admitting: Cardiology

## 2011-09-10 ENCOUNTER — Encounter: Payer: Self-pay | Admitting: Cardiology

## 2011-09-10 NOTE — Telephone Encounter (Signed)
Spoke to patient was told authorization for cath still under appeal,should get answer tomorrow 09/11/11.Advised to go to Er if has chest pain.

## 2011-09-10 NOTE — Telephone Encounter (Signed)
FYINoreene Larsson- [employer]  [phone]  Called to say, Dr. Elvis Coil appeal for patient heart cath procedure is being processed today and he should have an answer regarding the matter tomorrow 09/11/11.

## 2011-09-10 NOTE — Telephone Encounter (Signed)
New msg Pt was calling about auth for heart cath. Please call her back

## 2011-09-11 ENCOUNTER — Encounter (HOSPITAL_COMMUNITY): Payer: Self-pay | Admitting: Pharmacy Technician

## 2011-09-12 ENCOUNTER — Encounter (HOSPITAL_BASED_OUTPATIENT_CLINIC_OR_DEPARTMENT_OTHER): Payer: Self-pay

## 2011-09-12 ENCOUNTER — Inpatient Hospital Stay (HOSPITAL_BASED_OUTPATIENT_CLINIC_OR_DEPARTMENT_OTHER): Admit: 2011-09-12 | Payer: Self-pay | Admitting: Cardiology

## 2011-09-12 ENCOUNTER — Encounter (HOSPITAL_COMMUNITY)
Admission: RE | Disposition: A | Discharge status: Home / Self Care | Payer: Self-pay | Source: Ambulatory Visit | Attending: Cardiology

## 2011-09-12 ENCOUNTER — Telehealth: Payer: Self-pay | Admitting: Cardiology

## 2011-09-12 ENCOUNTER — Ambulatory Visit (HOSPITAL_COMMUNITY)
Admission: RE | Admit: 2011-09-12 | Discharge: 2011-09-12 | Disposition: A | Discharge status: Home / Self Care | Payer: Medicare Other | Source: Ambulatory Visit | Attending: Cardiology | Admitting: Cardiology

## 2011-09-12 DIAGNOSIS — R0602 Shortness of breath: Secondary | ICD-10-CM

## 2011-09-12 DIAGNOSIS — R0600 Dyspnea, unspecified: Secondary | ICD-10-CM

## 2011-09-12 DIAGNOSIS — J438 Other emphysema: Secondary | ICD-10-CM | POA: Insufficient documentation

## 2011-09-12 DIAGNOSIS — I10 Essential (primary) hypertension: Secondary | ICD-10-CM | POA: Insufficient documentation

## 2011-09-12 HISTORY — PX: LEFT AND RIGHT HEART CATHETERIZATION WITH CORONARY ANGIOGRAM: SHX5449

## 2011-09-12 LAB — POCT I-STAT 3, ART BLOOD GAS (G3+)
Bicarbonate: 25.2 mEq/L — ABNORMAL HIGH (ref 20.0–24.0)
TCO2: 26 mmol/L (ref 0–100)
pH, Arterial: 7.378 (ref 7.350–7.400)

## 2011-09-12 SURGERY — LEFT AND RIGHT HEART CATHETERIZATION WITH CORONARY ANGIOGRAM
Anesthesia: LOCAL | Laterality: Bilateral

## 2011-09-12 SURGERY — JV LEFT AND RIGHT HEART CATHETERIZATION WITH CORONARY ANGIOGRAM
Anesthesia: Moderate Sedation

## 2011-09-12 MED ORDER — ACETAMINOPHEN 325 MG PO TABS: 650.0000 mg | ORAL_TABLET | ORAL | Status: DC | PRN

## 2011-09-12 MED ORDER — LIDOCAINE HCL (PF) 1 % IJ SOLN
INTRAMUSCULAR | Status: AC
  Filled 2011-09-12: qty 30

## 2011-09-12 MED ORDER — MIDAZOLAM HCL 2 MG/2ML IJ SOLN
INTRAMUSCULAR | Status: AC
  Filled 2011-09-12: qty 2

## 2011-09-12 MED ORDER — SODIUM CHLORIDE 0.9 % IV SOLN: 250.0000 mL | INTRAVENOUS | Status: DC | PRN

## 2011-09-12 MED ORDER — DIAZEPAM 5 MG PO TABS
ORAL_TABLET | ORAL | Status: AC
  Filled 2011-09-12: qty 1

## 2011-09-12 MED ORDER — SODIUM CHLORIDE 0.9 % IJ SOLN: 3.0000 mL | INTRAMUSCULAR | Status: DC | PRN

## 2011-09-12 MED ORDER — ONDANSETRON HCL 4 MG/2ML IJ SOLN: 4.0000 mg | Freq: Four times a day (QID) | INTRAMUSCULAR | Status: DC | PRN

## 2011-09-12 MED ORDER — HEPARIN (PORCINE) IN NACL 2-0.9 UNIT/ML-% IJ SOLN
INTRAMUSCULAR | Status: AC
  Filled 2011-09-12: qty 2000

## 2011-09-12 MED ORDER — SODIUM CHLORIDE 0.9 % IV SOLN: 1.0000 mL/kg/h | INTRAVENOUS | Status: DC

## 2011-09-12 MED ORDER — DIAZEPAM 5 MG PO TABS
5.0000 mg | ORAL_TABLET | ORAL | Status: AC
  Administered 2011-09-12: 5 mg via ORAL

## 2011-09-12 MED ORDER — NITROGLYCERIN 0.2 MG/ML ON CALL CATH LAB
INTRAVENOUS | Status: AC
  Filled 2011-09-12: qty 1

## 2011-09-12 MED ORDER — SODIUM CHLORIDE 0.9 % IV SOLN
INTRAVENOUS | Status: DC
  Administered 2011-09-12: 08:00:00 via INTRAVENOUS

## 2011-09-12 MED ORDER — SODIUM CHLORIDE 0.9 % IJ SOLN: 3.0000 mL | Freq: Two times a day (BID) | INTRAMUSCULAR | Status: DC

## 2011-09-12 NOTE — Telephone Encounter (Signed)
Pt had cardiac cath wants to know when she needs a follow up

## 2011-09-12 NOTE — Progress Notes (Signed)
Right groin dressing removed and placed bandaid to site.  Site with small bruise at insertion site, but site WNL.  Orthostatic vs done and tolerated well by pt.  Ambulated in hallway and pt tolerated well.  D/c instructions reviewed with pt by London Pepper, RN.  Pt D/C home with spouse via wheelchair.

## 2011-09-12 NOTE — Telephone Encounter (Signed)
Patient called appointment scheduled with Norma Fredrickson NP 09/26/11.

## 2011-09-12 NOTE — Discharge Instructions (Signed)

## 2011-09-12 NOTE — CV Procedure (Signed)
   Cardiac Catheterization Procedure Note  Name: Amber Snow MRN: [id number] DOB: 23-Jul-1932  Procedure: Right Heart Cath, Left Heart Cath, Selective Coronary Angiography, LV angiography  Indication: 78 year old white female with history of hypertension, hyperlipidemia, peripheral arterial disease, and strong family history of coronary disease presents with symptoms of worsening dyspnea. She does have a history of emphysema. Noninvasive cardiac evaluation demonstrated an ejection fraction 27% by Myoview study with distal anteroseptal scar. Echocardiogram showed an ejection fraction of 35-40%.  Procedural Details: The right groin was prepped, draped, and anesthetized with 1% lidocaine. Using the modified Seldinger technique a 5 French sheath was placed in the right femoral artery and a 7 French sheath was placed in the right femoral vein. A Swan-Ganz catheter was used for the right heart catheterization. Standard protocol was followed for recording of right heart pressures and sampling of oxygen saturations. Fick cardiac output was calculated. Standard Judkins catheters were used for selective coronary angiography and left ventriculography. There were no immediate procedural complications. The patient was transferred to the post catheterization recovery area for further monitoring.  Procedural Findings: Hemodynamics RA 7 / 7 with a mean of 6 mm of mercury RV 32/7 mmHg PA 29/12 with a mean of 19 mmHg PCWP 13/10 with a mean of 10 mm mercury LV 137/11 mmHg AO 132/56 with a mean of 85 mmHg  Oxygen saturations: PA 62% AO 94%  Cardiac Output (Fick) 3.5 L per minute  Cardiac Index (Fick) 2.46 L per minute per meter square   Coronary angiography: Coronary dominance: right  Left mainstem: The left main is short and appears normal.  Left anterior descending (LAD): There is moderate calcification in the proximal LAD. There is 20% narrowing in the proximal and mid LAD.  Left circumflex  (LCx):  The left circumflex is relatively small and appears normal.  Right coronary artery (RCA): The right coronary is a large dominant vessel. There are mild irregularities in the mid vessel up to 20%.  Left ventriculography: Left ventricular systolic function is abnormal, LVEF is estimated at 35-40 %, there is apical dyssynergy, there is no significant mitral regurgitation   Final Conclusions:   1. Minimal nonobstructive coronary disease. 2. Moderate to severe left ventricular dysfunction. 3. Normal right heart pressures and left ventricular filling pressures.  Recommendations: Based on these findings I think that her symptoms of dyspnea are noncardiac. She has no significant coronary disease. Her filling pressures and right heart pressures were normal. She does have moderate to severe left ventricular dysfunction but appears to be euvolemic. Would recommend continued therapy with angiotensin receptor blockers. She is not a candidate for beta blockers because of her history of emphysema.   Shirly Bartosiewicz [place] 09/12/2011, 10:16 AM

## 2011-09-12 NOTE — H&P (View-Only) (Signed)
 Amber Snow Date of Birth: 01/28/1933 Medical Record #[id number]  History of Present Illness: Amber Snow is seen today at the request of Dr. Hopper for evaluation of fatigue and dyspnea. She is a pleasant 78-year-old white female who has a history of COPD and a chronic left bundle branch block. She reports that over the past 2 months she has had more severe fatigue associated with increased shortness of breath over the same period. She denies any significant wheezing and has had only an intermittent mild cough. She denies any significant chest pain or pressure. 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 She states at that time she was evaluated with a stress test. She does have infrequent chest pains which she relates to this problem. Over the past 3 months she has not been exercising significantly. She continues to smoke but has cut down to 3-4 cigarettes per day. She denies a history of hypertension or diabetes. She does have a history of hyperlipidemia.  Current Outpatient Prescriptions on File Prior to Visit  Medication Sig Dispense Refill  . Ascorbic Acid (VITAMIN C) 1000 MG tablet Take 1,000 mg by mouth daily.       . BIOTIN PO Take 1 capsule by mouth daily.      . Calcium Carbonate (CALTRATE 600) 1500 MG TABS Take by mouth daily. 2 1/2 tab by mouth daily      . Cholecalciferol (VITAMIN D) 1000 UNITS capsule Take 1,000 Units by mouth daily.       . Multiple Vitamin (MULTIVITAMIN) capsule Take 1 capsule by mouth daily.       . pravastatin (PRAVACHOL) 40 MG tablet TAKE 1 TABLET AT BEDTIME  30 tablet  11  . PROAIR HFA 108 (90 BASE) MCG/ACT inhaler INHALE 2 PUFFS EVERY 4 HOURS AS NEEDED FOR COUGH AND CONGESTION  1 Inhaler  5  . tiotropium (SPIRIVA) 18 MCG inhalation capsule Place 18 mcg into inhaler and inhale every morning. 1 inhalation daily      . traZODone (DESYREL) 50 MG tablet TAKE 1 TABLET EVERY DAY  90 tablet  0  . vitamin E 400 UNIT capsule Take 400 Units by  mouth daily.       . DISCONTD: traZODone (DESYREL) 50 MG tablet Take 50 mg by mouth at bedtime.         Allergies  Allergen Reactions  . Shrimp (Shellfish Allergy) Swelling  . Aspirin     REACTION: nausea  . Barbiturates     REACTION: rash  . Beta Adrenergic Blockers     PT STATES TOLD TO AVOID DUE TO COUGH REACTION TO CARDIZEM  . Calcium Channel Blockers     PER PT TOLD TO AVOID DUE TO COUGH REACTION TO CARDIZEM  . Celecoxib     REACTION: nausea  . Codeine     REACTION: excessive sleep  . Diltiazem Hcl Other (See Comments)    REACTION: cough  . Fluvastatin Sodium     REACTION: intolerance  . Ibandronate Sodium Other (See Comments)    REACTION: HAIR LOSS/NAIL CHANGES  . Rosuvastatin     REACTION: intolerance  . Simvastatin     REACTION: intolerance  . Penicillins Rash    Past Medical History  Diagnosis Date  . Carotid bruit     Right  . Diverticulosis of colon   . Hyperlipidemia   . History of nephrolithiasis   . Osteopenia   . COPD, moderate   . Cough   . Skipped   beats   . SOB (shortness of breath) on exertion   . Arthritis HANDS    Past Surgical History  Procedure Date  . Cosmetic surgery     eyelids  . Left long finger arthroplasty & pulley relase 02-17-2005  . Cysto/ left retrograde pyelogram/ left ureteral stent placement 10-16-2001    STONE  . Pulmonary function analysis 07-31-2009    MODERATE TO SEVERE OBSTRUCTIVE AIRWAY DISEASE/ INSIGNIFICANT RESPONSE TO BRONCHODILATORS/ EMPHYSEMA PATTERN/ MILD DECREASE IN DIFFUSE CAPACITY FEF 25-75 CHANGED BY 8%  . Tubal ligation AGE 30'S  . Tonsillectomy CHILD  . Extracorporeal shock wave lithotripsy 2003  . Cataract extraction w/ intraocular lens  implant, bilateral 1998  . Transurethral resection of bladder tumor 07/01/2011    Procedure: TRANSURETHRAL RESECTION OF BLADDER TUMOR (TURBT);  Surgeon: Marc-Henry Nesi, MD;  Location: [facility];  Service: Urology;  Laterality: N/A;  CYSTO  . Cystoscopy  07/01/2011    Procedure: CYSTOSCOPY;  Surgeon: Marc-Henry Nesi, MD;  Location: [facility];  Service: Urology;  Laterality: N/A;  retrieval l of bladder stone    History  Smoking status  . Current Some Day Smoker -- 0.2 packs/day  . Types: Cigarettes  . Last Attempt to Quit: 04/28/2009  Smokeless tobacco  . Never Used    History  Alcohol Use No    Family History  Problem Relation Age of Onset  . Heart disease Mother   . Hypertension Mother   . Hypertension Father   . Heart attack Father   . Heart disease Father   . Thyroid disease Sister   . Heart attack Sister   . Stroke Sister   . Heart attack Brother   . Cancer Maternal Grandmother     breast cancer  . Diabetes Paternal Grandmother     Review of Systems: The review of systems is positive for history of right carotid bruit.  She has no symptoms of TIA or stroke. She does get fatigue in her legs with walking but denies any pain. She has been intolerant to multiple statins in the past but has done fairly well on pravastatin. In mid-January she had a kidney stone. All other systems were reviewed and are negative.  Physical Exam: BP 145/78  Pulse 108  Ht 5' 3" (1.6 m)  Wt 43.636 kg (96 lb 3.2 oz)  BMI 17.04 kg/m2 She is an elderly, thin white female in no acute distress. She is normocephalic, atraumatic. Pupils are equal round and reactive to light and accommodation. Oropharynx is clear. Neck is supple without JVD, adenopathy, or thyromegaly. Chest is soft right carotid bruit. Lungs reveal diminished breath sounds without active wheezing or rhonchi. Cardiac exam reveals a regular rate and rhythm with a grade 1-2/6 systolic murmur at the apex. Abdomen is thin, soft, nontender. She has midline abdominal bruits. There is no mass. Extremities reveal right femoral bruit. Femoral pulses are 2+ and symmetric. Dorsalis pedis pulses are 1+ and symmetric. She has no phlebitis or edema. Skin is warm and dry. She is alert and  oriented x3. Cranial nerves II through XII are intact. LABORATORY DATA: ECG gated 08/07/2011 shows normal sinus rhythm with a left bundle branch block.  Assessment / Plan:   To summarize this patients situation. She is a 78 yo WF who presents with symptoms of worsening dyspnea and atypical chest pain. She has a LBBB. She has multiple risk factors including hyperlipidemia, tobacco abuse, strong family history of CAD, and PAD. Recent myoview study demonstrates a   fixed anteroseptal and apical defect. Her ejection fraction is severely reduced at 27% which is significantly worse than prior study in 2003. Right and left heart cath is indicated to assess her hemodynamics and to evaluate extent of coronary artery disease in order to guide her optimal therapy.  Cindie Rajagopalan MD, FACC 09/08/2011 11:47 AM    

## 2011-09-12 NOTE — Interval H&P Note (Signed)
History and Physical Interval Note:  09/12/2011 9:40 AM  Amber Snow  has presented today for surgery, with the diagnosis of reduced ef  The various methods of treatment have been discussed with the patient and family. After consideration of risks, benefits and other options for treatment, the patient has consented to  Procedure(s) (LRB): LEFT AND RIGHT HEART CATHETERIZATION WITH CORONARY ANGIOGRAM (Bilateral) as a surgical intervention .  The patients' history has been reviewed, patient examined, no change in status, stable for surgery.  I have reviewed the patients' chart and labs.  Questions were answered to the patient's satisfaction.     Theron Arista [facility] 09/12/2011 9:40 AM

## 2011-09-15 ENCOUNTER — Telehealth: Payer: Self-pay | Admitting: Cardiology

## 2011-09-15 LAB — POCT I-STAT 3, VENOUS BLOOD GAS (G3P V)
pCO2, Ven: 50.3 mmHg — ABNORMAL HIGH (ref 45.0–50.0)
pH, Ven: 7.317 — ABNORMAL HIGH (ref 7.250–7.300)

## 2011-09-15 NOTE — Telephone Encounter (Signed)
Pt having some discomfort at cath site and numbness going down her leg not warm to the touch or irregular dis color going on

## 2011-09-15 NOTE — Telephone Encounter (Signed)
Spoke with pt, yesterday she started having an ache in the right groin at the cath stick site and it goes down the leg. She also reports a little numbness. Explained to pt this is common, she can use warm compresses on the area. She will call back if the discomfort does not improve.

## 2011-09-26 ENCOUNTER — Encounter: Payer: Self-pay | Admitting: Nurse Practitioner

## 2011-09-26 ENCOUNTER — Ambulatory Visit (INDEPENDENT_AMBULATORY_CARE_PROVIDER_SITE_OTHER): Payer: Medicare Other | Admitting: Nurse Practitioner

## 2011-09-26 VITALS — BP 140/60 | HR 100 | Ht 63.0 in | Wt 96.0 lb

## 2011-09-26 DIAGNOSIS — I519 Heart disease, unspecified: Secondary | ICD-10-CM

## 2011-09-26 NOTE — Patient Instructions (Addendum)
Try to work on your smoking  Stay active  We will see you back in 6 months with Dr. Swaz[place]and.  Stay on your current medicines.  Keep checking your blood pressure at home.   Call the [facility] office at [phone] if you have any questions, problems or concerns.

## 2011-09-26 NOTE — Assessment & Plan Note (Signed)
She presents today post cath. She has minimal CAD with an EF of 35 to 40% but with normal filling pressures and normal right heart pressures. Her dyspnea is felt to more related to her lungs. Smoking cessation is encouraged. She is on low dose ARB. Blood pressure is much lower at home than here and her cuff does correlate nicely. No other changes made today. Will plan on seeing her back in 4 to 6 months and will otherwise defer her management back to her PCP. Patient is agreeable to this plan and will call if any problems develop in the interim.

## 2011-09-26 NOTE — Progress Notes (Signed)
Amber Snow Date of Birth: 01/17/1933 Medical Record #[id number]  History of Present Illness: Amber Snow is seen back today for a post cath visit. She is seen for Dr. Swazil[place]. She has had recent reduction in her LV function noted on Myoview. EF per echo was 35 to 40%. Cardiac cath was recommended which showed minimal nonobstructive disease, moderate to severe LV dysfunction with an EF of 35 to 40% but with normal filling pressures and normal right heart pressures. It was Dr. Elvis Coil feeling that her symptoms of dyspnea were not cardiac related. She had been started on ARB prior to the cath. Her other issues include COPD with ongoing tobacco abuse, PVD, HTN and HLD.   She comes in today. She is doing well. Did have some soreness with her groin but that has now resolved. Still smoking 3 cigarettes per day. Still with some shortness of breath. No chest pain. Her legs do tire easily but no actual pain/burning reported. Blood pressure has been running 100 to 110 systolic with the Losartan. She is not dizzy or lightheaded.   Current Outpatient Prescriptions on File Prior to Visit  Medication Sig Dispense Refill  . albuterol (PROVENTIL HFA;VENTOLIN HFA) 108 (90 BASE) MCG/ACT inhaler Inhale 2 puffs into the lungs every 4 (four) hours as needed. For cough/congestion      . Ascorbic Acid (VITAMIN C) 1000 MG tablet Take 1,000 mg by mouth daily.       Marland Kitchen BIOTIN PO Take 1 capsule by mouth daily.      . Calcium Carbonate (CALTRATE 600) 1500 MG TABS Take 2.5 tablets by mouth daily.       . Cholecalciferol (VITAMIN D) 1000 UNITS capsule Take 1,000 Units by mouth daily.       Marland Kitchen losartan (COZAAR) 50 MG tablet Take 1 tablet (50 mg total) by mouth daily.  30 tablet  11  . Multiple Vitamin (MULITIVITAMIN WITH MINERALS) TABS Take 1 tablet by mouth daily.      . pravastatin (PRAVACHOL) 40 MG tablet Take 40 mg by mouth daily.      Marland Kitchen tiotropium (SPIRIVA) 18 MCG inhalation capsule Place 18 mcg into inhaler and inhale  every morning.       . traZODone (DESYREL) 50 MG tablet Take 50 mg by mouth daily.      . vitamin E 400 UNIT capsule Take 400 Units by mouth daily.         Allergies  Allergen Reactions  . Shrimp (Shellfish Allergy) Swelling  . Aspirin     REACTION: nausea  . Barbiturates     REACTION: rash  . Beta Adrenergic Blockers     PT STATES TOLD TO AVOID DUE TO COUGH REACTION TO CARDIZEM  . Calcium Channel Blockers     PER PT TOLD TO AVOID DUE TO COUGH REACTION TO CARDIZEM  . Celecoxib     REACTION: nausea  . Codeine     REACTION: excessive sleep  . Diltiazem Hcl Other (See Comments)    REACTION: cough  . Fluvastatin Sodium     REACTION: intolerance  . Ibandronate Sodium Other (See Comments)    REACTION: HAIR LOSS/NAIL CHANGES  . Rosuvastatin     REACTION: intolerance  . Simvastatin     REACTION: intolerance  . Penicillins Rash    Past Medical History  Diagnosis Date  . Carotid bruit     Right  . Diverticulosis of colon   . Hyperlipidemia   . History of nephrolithiasis   .  Osteopenia   . COPD, moderate   . Cough   . Skipped beats   . SOB (shortness of breath) on exertion   . Arthritis HANDS  . CAD (coronary artery disease) 09/12/2011    minimal nonobstructive CAD per cath with EF of 35 to 40% and normal filling pressures and right heart pressures    Past Surgical History  Procedure Date  . Cosmetic surgery     eyelids  . Left long finger arthroplasty & pulley relase 02-17-2005  . Cysto/ left retrograde pyelogram/ left ureteral stent placement 10-16-2001    STONE  . Pulmonary function analysis 07-31-2009    MODERATE TO SEVERE OBSTRUCTIVE AIRWAY DISEASE/ INSIGNIFICANT RESPONSE TO BRONCHODILATORS/ EMPHYSEMA PATTERN/ MILD DECREASE IN DIFFUSE CAPACITY FEF 25-75 CHANGED BY 8%  . Tubal ligation AGE 76'S  . Tonsillectomy CHILD  . Extracorporeal shock wave lithotripsy 2003  . Cataract extraction w/ intraocular lens  implant, bilateral 1998  . Transurethral resection of  bladder tumor 07/01/2011    Procedure: TRANSURETHRAL RESECTION OF BLADDER TUMOR (TURBT);  Surgeon: Lindaann Slough, MD;  Location: [facility];  Service: Urology;  Laterality: N/A;  CYSTO  . Cystoscopy 07/01/2011    Procedure: CYSTOSCOPY;  Surgeon: Lindaann Slough, MD;  Location: [facility];  Service: Urology;  Laterality: N/A;  retrieval l of bladder stone    History  Smoking status  . Current Some Day Smoker -- 0.2 packs/day  . Types: Cigarettes  . Last Attempt to Quit: 04/28/2009  Smokeless tobacco  . Never Used    History  Alcohol Use No    Family History  Problem Relation Age of Onset  . Heart disease Mother   . Hypertension Mother   . Hypertension Father   . Heart attack Father   . Heart disease Father   . Thyroid disease Sister   . Heart attack Sister   . Stroke Sister   . Heart attack Brother   . Cancer Maternal Grandmother     breast cancer  . Diabetes Paternal Grandmother     Review of Systems: The review of systems is per the HPI.  All other systems were reviewed and are negative.  Physical Exam: BP 140/60  Pulse 100  Ht 5\' 3"  (1.6 m)  Wt 96 lb (43.545 kg)  BMI 17.01 kg/m2 Patient is very pleasant and in no acute distress. She is quite thin. Skin is warm and dry. Color is normal.  HEENT is unremarkable. Normocephalic/atraumatic. PERRL. Sclera are nonicteric. Neck is supple. No masses. No JVD. Lungs are clear. Cardiac exam shows a regular rate and rhythm. Abdomen is soft. Extremities are without edema. She has 2+ pulses in her feet. Gait and ROM are intact. No gross neurologic deficits noted.   LABORATORY DATA:  Lab Results  Component Value Date   WBC 7.4 09/04/2011   HGB 12.4 09/04/2011   HCT 37.3 09/04/2011   PLT 229.0 09/04/2011   GLUCOSE 94 09/04/2011   CHOL 174 01/03/2011   TRIG 134.0 01/03/2011   HDL 64.90 01/03/2011   LDLDIRECT 122.8 10/17/2009   LDLCALC 82 01/03/2011   ALT 21 01/03/2011   AST 30 01/03/2011   NA 142 09/04/2011   K 4.0  09/04/2011   CL 105 09/04/2011   CREATININE 1.0 09/04/2011   BUN 23 09/04/2011   CO2 29 09/04/2011   TSH 0.98 09/04/2011   INR 1.0 09/04/2011   HGBA1C 5.6 10/11/2009   Cardiac Cath Final Conclusions:  1. Minimal nonobstructive coronary disease.  2. Moderate to severe left ventricular dysfunction.  3. Normal right heart pressures and left ventricular filling pressures.   Recommendations: Based on these findings I think that her symptoms of dyspnea are noncardiac. She has no significant coronary disease. Her filling pressures and right heart pressures were normal. She does have moderate to severe left ventricular dysfunction but appears to be euvolemic. Would recommend continued therapy with angiotensin receptor blockers. She is not a candidate for beta blockers because of her history of emphysema.  Assessment / Plan:

## 2011-10-06 ENCOUNTER — Other Ambulatory Visit: Payer: Self-pay | Admitting: Internal Medicine

## 2011-11-13 ENCOUNTER — Other Ambulatory Visit: Payer: Self-pay | Admitting: Internal Medicine

## 2011-11-20 ENCOUNTER — Other Ambulatory Visit: Payer: Self-pay | Admitting: Internal Medicine

## 2011-12-10 ENCOUNTER — Other Ambulatory Visit: Payer: Self-pay | Admitting: Internal Medicine

## 2011-12-10 NOTE — Telephone Encounter (Signed)
Patient needs to schedule a CPX  

## 2011-12-17 ENCOUNTER — Encounter: Payer: Self-pay | Admitting: Internal Medicine

## 2011-12-17 ENCOUNTER — Ambulatory Visit (INDEPENDENT_AMBULATORY_CARE_PROVIDER_SITE_OTHER): Payer: Medicare Other | Admitting: Internal Medicine

## 2011-12-17 VITALS — BP 154/80 | HR 109 | Temp 98.7°F | Wt 99.2 lb

## 2011-12-17 DIAGNOSIS — L259 Unspecified contact dermatitis, unspecified cause: Secondary | ICD-10-CM

## 2011-12-17 DIAGNOSIS — L308 Other specified dermatitis: Secondary | ICD-10-CM

## 2011-12-17 DIAGNOSIS — C679 Malignant neoplasm of bladder, unspecified: Secondary | ICD-10-CM

## 2011-12-17 DIAGNOSIS — R5381 Other malaise: Secondary | ICD-10-CM

## 2011-12-17 DIAGNOSIS — R5383 Other fatigue: Secondary | ICD-10-CM

## 2011-12-17 NOTE — Patient Instructions (Addendum)
Take Allegra 160 mg a day; this is a nonsedating antihistamine or take Zyrtec at bedtime,this tends to be sedating.   If you activate My Chart; the results can be released to you as soon as they populate from the lab. If you choose not to use this program; the labs have to be reviewed, copied & mailed   causing a delay in getting the results to you.

## 2011-12-17 NOTE — Progress Notes (Signed)
  Subjective:    Patient ID: Amber Snow, female    DOB: 20-May-1932, 78 y.o.   MRN: [id number]  HPI She describes several weeks fatigue which she relates to BCG bladder instillations she is receiving for bladder cancer.  As of 12/13/11 she developed pruritic  papular lesions in the inguinal and suprapubic area which subsequently became vesicles. Subsequently she's noted lesions on her right thorax;  in the intertriginous areas of her right foot;LUE  & RLE.  She used Advil for pain around the thoracic lesions. Witch hazel topically work better for itching than topical steroids. She does believe that the itching and pain have improved significantly .  She questioned whether these might be related to spider bites.  Past medical history/family history/social history were all reviewed and updated. She has seen Dr. Londell Moh for minor dermatologic problem such as warts    Review of Systems She denies fever, chills, or sweats. She's had no associated abdominal pain, nausea, vomiting     Objective:   Physical Exam She appears thin with some limb atrophy. She's in no acute distress  Extraocular motion is intact. She has minor lid lag  She has no lymphadenopathy about the neck or axilla  Thyroid is daily and slightly asymmetric. No nodules are palpable  Breath sounds are decreased; there is no increased work of breathing.  She exhibits a gallop type cadence  Abdomen reveals no tenderness, organomegaly or masses  There are 2 symmetric vesicles of the thorax; lesions of extremities in the inguinal area are papular in nature and non-blanching. All lesions appears bland without active cellulitis.  There are no conjunctival or nail bed hemorrhages  Deep tendon reflexes are one half-1+. She has minor hand tremors    Assessment & Plan:  #1 vesicular/papular dermatitis. Rule out iatrogenic process versus leukocytoclastic vasculitis type process  #2 fatigue  #3 BCG bladder installations for  bladder cancer. BCG can cause marrow suppression and hypersensitivity reactions; pharmacology book does not describe a specific dermatitis reaction  Plan: Labs will be collected; copy of reports to Dr.Nesi.

## 2011-12-18 LAB — CBC WITH DIFFERENTIAL/PLATELET
Basophils Absolute: 0 10*3/uL (ref 0.0–0.1)
Eosinophils Absolute: 0.3 10*3/uL (ref 0.0–0.7)
HCT: 40.3 % (ref 36.0–46.0)
Lymphs Abs: 1.6 10*3/uL (ref 0.7–4.0)
MCHC: 33.2 g/dL (ref 30.0–36.0)
MCV: 95.6 fl (ref 78.0–100.0)
Monocytes Absolute: 0.5 10*3/uL (ref 0.1–1.0)
Neutrophils Relative %: 66.9 % (ref 43.0–77.0)
Platelets: 241 10*3/uL (ref 150.0–400.0)
RDW: 13.3 % (ref 11.5–14.6)

## 2011-12-18 LAB — BASIC METABOLIC PANEL
CO2: 29 mEq/L (ref 19–32)
Calcium: 9.4 mg/dL (ref 8.4–10.5)
Creatinine, Ser: 0.8 mg/dL (ref 0.4–1.2)
GFR: 78.45 mL/min (ref >60.00)

## 2011-12-18 LAB — HEPATIC FUNCTION PANEL
ALT: 20 U/L (ref 0–35)
AST: 31 U/L (ref 0–37)
Alkaline Phosphatase: 94 U/L (ref 39–117)
Total Bilirubin: 0.5 mg/dL (ref 0.3–1.2)

## 2011-12-18 LAB — TSH: TSH: 1.18 u[IU]/mL (ref 0.35–5.50)

## 2011-12-19 ENCOUNTER — Encounter: Payer: Self-pay | Admitting: *Deleted

## 2012-01-28 ENCOUNTER — Other Ambulatory Visit: Payer: Self-pay | Admitting: Obstetrics

## 2012-01-28 DIAGNOSIS — M8580 Other specified disorders of bone density and structure, unspecified site: Secondary | ICD-10-CM

## 2012-01-28 DIAGNOSIS — Z1231 Encounter for screening mammogram for malignant neoplasm of breast: Secondary | ICD-10-CM

## 2012-02-08 ENCOUNTER — Other Ambulatory Visit: Payer: Self-pay | Admitting: Internal Medicine

## 2012-02-13 ENCOUNTER — Other Ambulatory Visit: Payer: Self-pay | Admitting: Internal Medicine

## 2012-02-13 NOTE — Telephone Encounter (Signed)
Last seen 12/17/11 and filled 11/13/11 # 90. Please advise     KP

## 2012-02-13 NOTE — Telephone Encounter (Signed)
done

## 2012-02-19 ENCOUNTER — Ambulatory Visit
Admission: RE | Admit: 2012-02-19 | Discharge: 2012-02-19 | Disposition: A | Discharge status: Home / Self Care | Payer: Medicare Other | Source: Ambulatory Visit | Attending: Obstetrics | Admitting: Obstetrics

## 2012-02-19 DIAGNOSIS — Z1231 Encounter for screening mammogram for malignant neoplasm of breast: Secondary | ICD-10-CM

## 2012-02-19 DIAGNOSIS — M8580 Other specified disorders of bone density and structure, unspecified site: Secondary | ICD-10-CM

## 2012-03-04 ENCOUNTER — Ambulatory Visit (INDEPENDENT_AMBULATORY_CARE_PROVIDER_SITE_OTHER)
Admission: RE | Admit: 2012-03-04 | Discharge: 2012-03-04 | Disposition: A | Discharge status: Home / Self Care | Payer: Medicare Other | Source: Ambulatory Visit | Attending: Internal Medicine | Admitting: Internal Medicine

## 2012-03-04 ENCOUNTER — Encounter: Payer: Self-pay | Admitting: Internal Medicine

## 2012-03-04 ENCOUNTER — Ambulatory Visit (INDEPENDENT_AMBULATORY_CARE_PROVIDER_SITE_OTHER): Payer: Medicare Other | Admitting: Internal Medicine

## 2012-03-04 VITALS — BP 148/80 | HR 104 | Temp 98.1°F | Resp 16

## 2012-03-04 DIAGNOSIS — R0600 Dyspnea, unspecified: Secondary | ICD-10-CM

## 2012-03-04 DIAGNOSIS — J449 Chronic obstructive pulmonary disease, unspecified: Secondary | ICD-10-CM

## 2012-03-04 DIAGNOSIS — R0609 Other forms of dyspnea: Secondary | ICD-10-CM

## 2012-03-04 DIAGNOSIS — R5383 Other fatigue: Secondary | ICD-10-CM

## 2012-03-04 DIAGNOSIS — R0989 Other specified symptoms and signs involving the circulatory and respiratory systems: Secondary | ICD-10-CM

## 2012-03-04 MED ORDER — BUDESONIDE-FORMOTEROL FUMARATE 160-4.5 MCG/ACT IN AERO: 2.0000 | INHALATION_SPRAY | Freq: Two times a day (BID) | RESPIRATORY_TRACT | Status: DC

## 2012-03-04 NOTE — Progress Notes (Signed)
  Subjective:    Patient ID: Amber Snow, female    DOB: 1932-12-29, 78 y.o.   MRN: [id number]  HPI  COUGH/ DYSPNEA: Onset:several months ago  Trigger:no specific issue Course:increasing even @ rest Treatment/efficacy:albuterol 2-3 X/day with benefit Cream colored sputum ;  wheezing    Occupational/environmental exposures:no Smoking history:58 years up to 1 ppd ACE inhibitor administration:no Past medical history/family history pulmonary disease:see update   Chest x-ray 5/13 reviewed; emphysema present without any superimposed active process. CBC and differential was normal in August of this year    Review of Systems intermittent fatigue No facial pain, frontal headaches, nasal purulence, dental pain,sorethroat, or ear ache/otic discharge  No itchy eyes, sneezing, or angioedema  No fever, chills, sweats, and purulent secretions  No pleuritic pain,  hemoptysis   No dyspepsia, dysphagia, reflux symptoms      Objective:   Physical Exam General appearance:thin but adequately nourished; no acute distress or increased work of breathing is present.  No  lymphadenopathy about the head, neck, or axilla noted.   Eyes: No conjunctival inflammation or lid edema is present.   Oral exam: Dental hygiene is good; lips and gums are healthy appearing.There is no oropharyngeal erythema or exudate noted.   Neck:  No deformities, thyromegaly, masses, or tenderness noted.   Supple with full range of motion without pain.   Heart:  Normal rate and regular rhythm. S1 and S2 normal without gallop, murmur, click, rub or other extra sounds. Respiratory variation to rhythm  Lungs:Chest clear to auscultation; no wheezes, rhonchi,rales ,or rubs present.No increased work of breathing.  Breath sounds are decreased in all lung fields. There is not significant expansion with deep inspiration  Extremities:  No cyanosis, edema, or clubbing  noted    Skin: Warm & dry           Assessment & Plan:    #1 dyspnea, progressive in the context of COPD  Plan: See orders and recommendations

## 2012-03-04 NOTE — Patient Instructions (Addendum)
Order for x-rays entered into  the computer; these will be performed at [address] [facility]. across from [facility]. No appointment is necessary. Symbicort 1-2  inhalations every 12 hours; gargle and spit after use

## 2012-03-05 LAB — CBC WITH DIFFERENTIAL/PLATELET
Basophils Absolute: 0.1 10*3/uL (ref 0.0–0.1)
Basophils Relative: 1.2 % (ref 0.0–3.0)
Eosinophils Absolute: 0.2 10*3/uL (ref 0.0–0.7)
Lymphocytes Relative: 24.4 % (ref 12.0–46.0)
MCHC: 33.5 g/dL (ref 30.0–36.0)
Monocytes Relative: 7.8 % (ref 3.0–12.0)
Neutrophils Relative %: 64.6 % (ref 43.0–77.0)
RBC: 4.01 Mil/uL (ref 3.87–5.11)
WBC: 7.8 10*3/uL (ref 4.5–10.5)

## 2012-03-08 ENCOUNTER — Telehealth: Payer: Self-pay | Admitting: Internal Medicine

## 2012-03-08 NOTE — Telephone Encounter (Signed)
LM ON TRIAGE LINE 114pm Wants results from XRAY/lab work last week cb# [phone]

## 2012-03-08 NOTE — Telephone Encounter (Signed)
Spoke with patient, patient aware of xray and lab results. Copies of reports mailed

## 2012-03-31 ENCOUNTER — Encounter: Payer: Self-pay | Admitting: Cardiology

## 2012-03-31 ENCOUNTER — Ambulatory Visit (INDEPENDENT_AMBULATORY_CARE_PROVIDER_SITE_OTHER): Payer: Medicare Other | Admitting: Cardiology

## 2012-03-31 VITALS — BP 138/62 | HR 98 | Ht 63.0 in | Wt 109.0 lb

## 2012-03-31 DIAGNOSIS — J449 Chronic obstructive pulmonary disease, unspecified: Secondary | ICD-10-CM

## 2012-03-31 DIAGNOSIS — R0989 Other specified symptoms and signs involving the circulatory and respiratory systems: Secondary | ICD-10-CM

## 2012-03-31 DIAGNOSIS — R0600 Dyspnea, unspecified: Secondary | ICD-10-CM

## 2012-03-31 DIAGNOSIS — R0609 Other forms of dyspnea: Secondary | ICD-10-CM

## 2012-03-31 DIAGNOSIS — I519 Heart disease, unspecified: Secondary | ICD-10-CM

## 2012-03-31 DIAGNOSIS — J4489 Other specified chronic obstructive pulmonary disease: Secondary | ICD-10-CM

## 2012-03-31 DIAGNOSIS — I447 Left bundle-branch block, unspecified: Secondary | ICD-10-CM

## 2012-03-31 NOTE — Patient Instructions (Signed)
Continue your current therapy  I will see you in 6 months with an Echocardiogram   

## 2012-03-31 NOTE — Progress Notes (Signed)
Amber Snow Date of Birth: 1932-12-04 Medical Record #[id number]  History of Present Illness: Amber Snow is seen back today for a followup visit. EF per echo was 35 to 40%. Cardiac cath was recommended which showed minimal nonobstructive disease, moderate to severe LV dysfunction with an EF of 35 to 40% but with normal filling pressures and normal right heart pressures. She has chronic dyspnea related predominantly to COPD. She reports she has quit smoking. She is currently on Spiriva, pro-air, and Symbicort. She complains that her breathing is no better. She's had no significant edema. She denies any palpitations or chest pain. Since she quit smoking she has gained about 13 pounds.  Current Outpatient Prescriptions on File Prior to Visit  Medication Sig Dispense Refill  . Ascorbic Acid (VITAMIN C) 1000 MG tablet Take 1,000 mg by mouth daily.       Marland Kitchen BIOTIN PO Take 1 capsule by mouth daily.      . budesonide-formoterol (SYMBICORT) 160-4.5 MCG/ACT inhaler Inhale 2 puffs into the lungs 2 (two) times daily.  1 Inhaler  3  . Calcium Carbonate (CALTRATE 600) 1500 MG TABS Take 2.5 tablets by mouth daily.       . Cholecalciferol (VITAMIN D) 1000 UNITS capsule Take 1,000 Units by mouth daily.       Marland Kitchen losartan (COZAAR) 50 MG tablet Take 1 tablet (50 mg total) by mouth daily.  30 tablet  11  . Multiple Vitamin (MULITIVITAMIN WITH MINERALS) TABS Take 1 tablet by mouth daily.      . pravastatin (PRAVACHOL) 40 MG tablet TAKE 1 TABLET AT BEDTIME  90 tablet  0  . PROAIR HFA 108 (90 BASE) MCG/ACT inhaler INHALE 2 PUFFS EVERY 4 HOURS AS NEEDED FOR COUGH AND CONGESTION  8.5 g  2  . SPIRIVA HANDIHALER 18 MCG inhalation capsule USE 1 INHALALTION EVERY DAY  30 each  5  . traZODone (DESYREL) 50 MG tablet TAKE 1 TABLET EVERY DAY  90 tablet  1  . vitamin E 400 UNIT capsule Take 400 Units by mouth daily.         Allergies  Allergen Reactions  . Shrimp (Shellfish Allergy) Swelling  . Aspirin     REACTION:  nausea  . Barbiturates     REACTION: rash  . Beta Adrenergic Blockers     PT STATES TOLD TO AVOID DUE TO COUGH REACTION TO CARDIZEM  . Calcium Channel Blockers     PER PT TOLD TO AVOID DUE TO COUGH REACTION TO CARDIZEM  . Celecoxib     REACTION: nausea  . Codeine     REACTION: excessive sleep  . Diltiazem Hcl Other (See Comments)    REACTION: cough  . Fluvastatin Sodium     REACTION: intolerance  . Ibandronate Sodium Other (See Comments)    REACTION: HAIR LOSS/NAIL CHANGES  . Rosuvastatin     REACTION: intolerance  . Simvastatin     REACTION: intolerance  . Penicillins Rash    Past Medical History  Diagnosis Date  . Carotid bruit     Right  . Diverticulosis of colon   . Hyperlipidemia   . History of nephrolithiasis   . Osteopenia   . COPD, moderate   . Cough   . Skipped beats   . Arthritis HANDS  . CAD (coronary artery disease) 09/12/2011    minimal nonobstructive CAD per cath with EF of 35 to 40% and normal filling pressures and right heart pressures  . LV dysfunction   .  Chronic systolic CHF (congestive heart failure)     Past Surgical History  Procedure Date  . Cosmetic surgery     eyelids  . Left long finger arthroplasty & pulley relase 02-17-2005  . Cysto/ left retrograde pyelogram/ left ureteral stent placement 10-16-2001    STONE  . Pulmonary function analysis 07-31-2009    MODERATE TO SEVERE OBSTRUCTIVE AIRWAY DISEASE/ INSIGNIFICANT RESPONSE TO BRONCHODILATORS/ EMPHYSEMA PATTERN/ MILD DECREASE IN DIFFUSE CAPACITY FEF 25-75 CHANGED BY 8%  . Tubal ligation AGE 38'S  . Tonsillectomy CHILD  . Extracorporeal shock wave lithotripsy 2003  . Cataract extraction w/ intraocular lens  implant, bilateral 1998  . Transurethral resection of bladder tumor 07/01/2011    Procedure: TRANSURETHRAL RESECTION OF BLADDER TUMOR (TURBT);  Surgeon: Lindaann Slough, MD;  Location: [facility];  Service: Urology;  Laterality: N/A;  CYSTO  . Cystoscopy 07/01/2011     Procedure: CYSTOSCOPY;  Surgeon: Lindaann Slough, MD;  Location: [facility];  Service: Urology;  Laterality: N/A;  retrieval l of bladder stone    History  Smoking status  . Former Smoker -- 1.0 packs/day  . Types: Cigarettes  . Quit date: 09/27/2011  Smokeless tobacco  . Never Used    Comment: statrted age 24- 28, up to 1ppd for ? years    History  Alcohol Use No    Family History  Problem Relation Age of Onset  . Heart disease Mother   . Hypertension Mother   . Hypertension Father   . Heart attack Father   . Heart disease Father   . Thyroid disease Sister   . Heart attack Sister   . Stroke Sister   . COPD Sister   . Heart attack Brother   . Cancer Maternal Grandmother     breast cancer  . Diabetes Paternal Grandmother   . Asthma Neg Hx     Review of Systems: The review of systems is per the HPI.  All other systems were reviewed and are negative.  Physical Exam: BP 138/62  Pulse 98  Ht 5\' 3"  (1.6 m)  Wt 109 lb (49.442 kg)  BMI 19.31 kg/m2  SpO2 99% Patient is very pleasant and in no acute distress. She is quite thin. Skin is warm and dry. Color is normal.  HEENT is unremarkable. Normocephalic/atraumatic. PERRL. Sclera are nonicteric. Neck is supple. No masses. No JVD. Lungs are clear with diminished breath sounds. Cardiac exam shows a regular rate and rhythm. Abdomen is soft. Extremities are without edema. She has 2+ pulses in her feet. Gait and ROM are intact. No gross neurologic deficits noted.   LABORATORY DATA:    Assessment / Plan: 1. LV dysfunction with chronic systolic heart failure. Ejection fraction 35-40%. No evidence of volume overload. I think that her symptoms of dyspnea predominantly related to her COPD. This based on her previous right heart data. We will continue with losartan. She is not a candidate for beta blocker therapy given her COPD. I'll see her back in 6 months and we will repeat an echocardiogram at that time.  2. COPD.  Patient has a history of chronic tobacco abuse now stopped.

## 2012-05-14 ENCOUNTER — Other Ambulatory Visit: Payer: Self-pay | Admitting: Internal Medicine

## 2012-05-14 NOTE — Telephone Encounter (Signed)
Lipid/Hep 272.4/995.20  

## 2012-06-01 ENCOUNTER — Emergency Department (HOSPITAL_COMMUNITY): Payer: Medicare Other

## 2012-06-01 ENCOUNTER — Inpatient Hospital Stay (HOSPITAL_COMMUNITY)
Admission: EM | Admit: 2012-06-01 | Discharge: 2012-06-05 | DRG: 191 | Disposition: A | Discharge status: Home / Self Care | Payer: Medicare Other | Attending: Internal Medicine | Admitting: Internal Medicine

## 2012-06-01 ENCOUNTER — Telehealth: Payer: Self-pay | Admitting: Internal Medicine

## 2012-06-01 ENCOUNTER — Encounter (HOSPITAL_COMMUNITY): Payer: Self-pay | Admitting: Emergency Medicine

## 2012-06-01 DIAGNOSIS — I509 Heart failure, unspecified: Secondary | ICD-10-CM | POA: Diagnosis present

## 2012-06-01 DIAGNOSIS — I428 Other cardiomyopathies: Secondary | ICD-10-CM

## 2012-06-01 DIAGNOSIS — Z87891 Personal history of nicotine dependence: Secondary | ICD-10-CM

## 2012-06-01 DIAGNOSIS — J4489 Other specified chronic obstructive pulmonary disease: Secondary | ICD-10-CM

## 2012-06-01 DIAGNOSIS — R0600 Dyspnea, unspecified: Secondary | ICD-10-CM

## 2012-06-01 DIAGNOSIS — IMO0002 Reserved for concepts with insufficient information to code with codable children: Secondary | ICD-10-CM

## 2012-06-01 DIAGNOSIS — I447 Left bundle-branch block, unspecified: Secondary | ICD-10-CM

## 2012-06-01 DIAGNOSIS — E559 Vitamin D deficiency, unspecified: Secondary | ICD-10-CM

## 2012-06-01 DIAGNOSIS — M545 Low back pain, unspecified: Secondary | ICD-10-CM

## 2012-06-01 DIAGNOSIS — R930 Abnormal findings on diagnostic imaging of skull and head, not elsewhere classified: Secondary | ICD-10-CM

## 2012-06-01 DIAGNOSIS — I5032 Chronic diastolic (congestive) heart failure: Secondary | ICD-10-CM | POA: Diagnosis present

## 2012-06-01 DIAGNOSIS — D72829 Elevated white blood cell count, unspecified: Secondary | ICD-10-CM

## 2012-06-01 DIAGNOSIS — I2510 Atherosclerotic heart disease of native coronary artery without angina pectoris: Secondary | ICD-10-CM | POA: Diagnosis present

## 2012-06-01 DIAGNOSIS — M949 Disorder of cartilage, unspecified: Secondary | ICD-10-CM | POA: Diagnosis present

## 2012-06-01 DIAGNOSIS — R011 Cardiac murmur, unspecified: Secondary | ICD-10-CM

## 2012-06-01 DIAGNOSIS — R5381 Other malaise: Secondary | ICD-10-CM

## 2012-06-01 DIAGNOSIS — Z79899 Other long term (current) drug therapy: Secondary | ICD-10-CM

## 2012-06-01 DIAGNOSIS — I519 Heart disease, unspecified: Secondary | ICD-10-CM

## 2012-06-01 DIAGNOSIS — N200 Calculus of kidney: Secondary | ICD-10-CM

## 2012-06-01 DIAGNOSIS — J441 Chronic obstructive pulmonary disease with (acute) exacerbation: Secondary | ICD-10-CM

## 2012-06-01 DIAGNOSIS — R29818 Other symptoms and signs involving the nervous system: Secondary | ICD-10-CM

## 2012-06-01 DIAGNOSIS — M899 Disorder of bone, unspecified: Secondary | ICD-10-CM

## 2012-06-01 DIAGNOSIS — J449 Chronic obstructive pulmonary disease, unspecified: Secondary | ICD-10-CM

## 2012-06-01 DIAGNOSIS — Z8719 Personal history of other diseases of the digestive system: Secondary | ICD-10-CM

## 2012-06-01 DIAGNOSIS — J189 Pneumonia, unspecified organism: Secondary | ICD-10-CM

## 2012-06-01 DIAGNOSIS — R946 Abnormal results of thyroid function studies: Secondary | ICD-10-CM

## 2012-06-01 DIAGNOSIS — K5730 Diverticulosis of large intestine without perforation or abscess without bleeding: Secondary | ICD-10-CM

## 2012-06-01 DIAGNOSIS — R5383 Other fatigue: Secondary | ICD-10-CM

## 2012-06-01 DIAGNOSIS — E785 Hyperlipidemia, unspecified: Secondary | ICD-10-CM

## 2012-06-01 DIAGNOSIS — R911 Solitary pulmonary nodule: Secondary | ICD-10-CM

## 2012-06-01 DIAGNOSIS — C679 Malignant neoplasm of bladder, unspecified: Secondary | ICD-10-CM

## 2012-06-01 DIAGNOSIS — R1319 Other dysphagia: Secondary | ICD-10-CM

## 2012-06-01 DIAGNOSIS — IMO0001 Reserved for inherently not codable concepts without codable children: Secondary | ICD-10-CM

## 2012-06-01 DIAGNOSIS — M2550 Pain in unspecified joint: Secondary | ICD-10-CM

## 2012-06-01 DIAGNOSIS — I739 Peripheral vascular disease, unspecified: Secondary | ICD-10-CM

## 2012-06-01 DIAGNOSIS — G8929 Other chronic pain: Secondary | ICD-10-CM | POA: Diagnosis present

## 2012-06-01 DIAGNOSIS — I498 Other specified cardiac arrhythmias: Secondary | ICD-10-CM | POA: Diagnosis present

## 2012-06-01 DIAGNOSIS — Z87442 Personal history of urinary calculi: Secondary | ICD-10-CM

## 2012-06-01 DIAGNOSIS — T887XXA Unspecified adverse effect of drug or medicament, initial encounter: Secondary | ICD-10-CM

## 2012-06-01 LAB — CBC WITH DIFFERENTIAL/PLATELET
Basophils Absolute: 0 10*3/uL (ref 0.0–0.1)
HCT: 42.5 % (ref 36.0–46.0)
Lymphocytes Relative: 10 % — ABNORMAL LOW (ref 12–46)
Neutro Abs: 14.2 10*3/uL — ABNORMAL HIGH (ref 1.7–7.7)
Platelets: 241 10*3/uL (ref 150–400)
RBC: 4.48 MIL/uL (ref 3.87–5.11)
RDW: 13.1 % (ref 11.5–15.5)
WBC: 16.9 10*3/uL — ABNORMAL HIGH (ref 4.0–10.5)

## 2012-06-01 LAB — BLOOD GAS, ARTERIAL
O2 Content: 1 L/min
Patient temperature: 98.6
pH, Arterial: 7.435 (ref 7.350–7.450)

## 2012-06-01 LAB — BASIC METABOLIC PANEL
CO2: 26 mEq/L (ref 19–32)
Chloride: 103 mEq/L (ref 96–112)
Sodium: 141 mEq/L (ref 135–145)

## 2012-06-01 LAB — PRO B NATRIURETIC PEPTIDE: Pro B Natriuretic peptide (BNP): 800.1 pg/mL — ABNORMAL HIGH (ref 0–450)

## 2012-06-01 LAB — TROPONIN I: Troponin I: <0.3 ng/mL (ref <0.30)

## 2012-06-01 MED ORDER — ENOXAPARIN SODIUM 40 MG/0.4ML ~~LOC~~ SOLN
40.0000 mg | Freq: Every day | SUBCUTANEOUS | Status: DC
  Administered 2012-06-01 – 2012-06-04 (×4): 40 mg via SUBCUTANEOUS
  Filled 2012-06-01 (×5): qty 0.4

## 2012-06-01 MED ORDER — SODIUM CHLORIDE 0.9 % IV SOLN: 250.0000 mL | INTRAVENOUS | Status: DC | PRN

## 2012-06-01 MED ORDER — TRAZODONE HCL 50 MG PO TABS
50.0000 mg | ORAL_TABLET | Freq: Every day | ORAL | Status: DC
  Administered 2012-06-01 – 2012-06-04 (×4): 50 mg via ORAL
  Filled 2012-06-01 (×5): qty 1

## 2012-06-01 MED ORDER — LEVOFLOXACIN IN D5W 750 MG/150ML IV SOLN
750.0000 mg | Freq: Once | INTRAVENOUS | Status: AC
  Administered 2012-06-01: 750 mg via INTRAVENOUS
  Filled 2012-06-01: qty 150

## 2012-06-01 MED ORDER — VITAMIN D 1000 UNITS PO TABS
1000.0000 [IU] | ORAL_TABLET | Freq: Every day | ORAL | Status: DC
  Administered 2012-06-02 – 2012-06-05 (×4): 1000 [IU] via ORAL
  Filled 2012-06-01 (×4): qty 1

## 2012-06-01 MED ORDER — IOHEXOL 350 MG/ML SOLN
100.0000 mL | Freq: Once | INTRAVENOUS | Status: AC | PRN
Start: 2012-06-01 — End: 2012-06-01
  Administered 2012-06-01: 80 mL via INTRAVENOUS

## 2012-06-01 MED ORDER — FUROSEMIDE 10 MG/ML IJ SOLN
40.0000 mg | Freq: Once | INTRAMUSCULAR | Status: AC
  Administered 2012-06-01: 40 mg via INTRAVENOUS
  Filled 2012-06-01: qty 4

## 2012-06-01 MED ORDER — DOCUSATE SODIUM 100 MG PO CAPS
100.0000 mg | ORAL_CAPSULE | Freq: Two times a day (BID) | ORAL | Status: DC
  Administered 2012-06-01 – 2012-06-05 (×8): 100 mg via ORAL
  Filled 2012-06-01 (×10): qty 1

## 2012-06-01 MED ORDER — ALENDRONATE SODIUM 70 MG PO TBEF: 1.0000 | EFFERVESCENT_TABLET | ORAL | Status: DC

## 2012-06-01 MED ORDER — BUDESONIDE-FORMOTEROL FUMARATE 160-4.5 MCG/ACT IN AERO
2.0000 | INHALATION_SPRAY | Freq: Two times a day (BID) | RESPIRATORY_TRACT | Status: DC
  Administered 2012-06-02 – 2012-06-05 (×7): 2 via RESPIRATORY_TRACT
  Filled 2012-06-01: qty 6

## 2012-06-01 MED ORDER — PRAVASTATIN SODIUM 40 MG PO TABS
40.0000 mg | ORAL_TABLET | Freq: Every day | ORAL | Status: DC
  Administered 2012-06-01 – 2012-06-04 (×4): 40 mg via ORAL
  Filled 2012-06-01 (×5): qty 1

## 2012-06-01 MED ORDER — CALCIUM CARBONATE 1250 (500 CA) MG PO TABS
2.5000 | ORAL_TABLET | Freq: Every day | ORAL | Status: DC
  Administered 2012-06-02 – 2012-06-05 (×4): 1250 mg via ORAL
  Filled 2012-06-01 (×4): qty 2.5

## 2012-06-01 MED ORDER — LOSARTAN POTASSIUM 50 MG PO TABS
50.0000 mg | ORAL_TABLET | Freq: Every evening | ORAL | Status: DC
  Administered 2012-06-02 – 2012-06-04 (×3): 50 mg via ORAL
  Filled 2012-06-01 (×4): qty 1

## 2012-06-01 MED ORDER — METHYLPREDNISOLONE SODIUM SUCC 40 MG IJ SOLR
40.0000 mg | Freq: Four times a day (QID) | INTRAMUSCULAR | Status: DC
  Administered 2012-06-01 – 2012-06-02 (×4): 40 mg via INTRAVENOUS
  Filled 2012-06-01 (×6): qty 1

## 2012-06-01 MED ORDER — IBUPROFEN 400 MG PO TABS
400.0000 mg | ORAL_TABLET | Freq: Three times a day (TID) | ORAL | Status: DC | PRN
  Administered 2012-06-01 – 2012-06-04 (×2): 400 mg via ORAL
  Filled 2012-06-01 (×2): qty 1

## 2012-06-01 MED ORDER — METHYLPREDNISOLONE SODIUM SUCC 125 MG IJ SOLR
125.0000 mg | Freq: Once | INTRAMUSCULAR | Status: AC
  Administered 2012-06-01: 125 mg via INTRAVENOUS
  Filled 2012-06-01: qty 2

## 2012-06-01 MED ORDER — ONDANSETRON HCL 4 MG PO TABS: 4.0000 mg | ORAL_TABLET | Freq: Four times a day (QID) | ORAL | Status: DC | PRN

## 2012-06-01 MED ORDER — ALBUTEROL SULFATE (5 MG/ML) 0.5% IN NEBU
10.0000 mg | INHALATION_SOLUTION | Freq: Once | RESPIRATORY_TRACT | Status: AC
  Administered 2012-06-01: 10 mg via RESPIRATORY_TRACT
  Filled 2012-06-01: qty 2

## 2012-06-01 MED ORDER — ONDANSETRON HCL 4 MG/2ML IJ SOLN: 4.0000 mg | Freq: Four times a day (QID) | INTRAMUSCULAR | Status: DC | PRN

## 2012-06-01 MED ORDER — LEVOFLOXACIN IN D5W 500 MG/100ML IV SOLN
500.0000 mg | INTRAVENOUS | Status: DC
  Administered 2012-06-02: 500 mg via INTRAVENOUS
  Filled 2012-06-01 (×2): qty 100

## 2012-06-01 MED ORDER — ALBUTEROL SULFATE (5 MG/ML) 0.5% IN NEBU: 2.5000 mg | INHALATION_SOLUTION | RESPIRATORY_TRACT | Status: DC | PRN

## 2012-06-01 MED ORDER — SIMVASTATIN 20 MG PO TABS: 20.0000 mg | ORAL_TABLET | Freq: Every day | ORAL | Status: DC

## 2012-06-01 MED ORDER — SODIUM CHLORIDE 0.9 % IJ SOLN: 3.0000 mL | INTRAMUSCULAR | Status: DC | PRN

## 2012-06-01 MED ORDER — ADULT MULTIVITAMIN W/MINERALS CH
1.0000 | ORAL_TABLET | Freq: Every day | ORAL | Status: DC
  Administered 2012-06-02 – 2012-06-05 (×4): 1 via ORAL
  Filled 2012-06-01 (×4): qty 1

## 2012-06-01 MED ORDER — ALBUTEROL SULFATE (5 MG/ML) 0.5% IN NEBU
2.5000 mg | INHALATION_SOLUTION | Freq: Four times a day (QID) | RESPIRATORY_TRACT | Status: DC
  Administered 2012-06-01 – 2012-06-05 (×15): 2.5 mg via RESPIRATORY_TRACT
  Filled 2012-06-01 (×13): qty 0.5

## 2012-06-01 MED ORDER — TIOTROPIUM BROMIDE MONOHYDRATE 18 MCG IN CAPS
18.0000 ug | ORAL_CAPSULE | Freq: Every day | RESPIRATORY_TRACT | Status: DC
  Administered 2012-06-02 – 2012-06-05 (×4): 18 ug via RESPIRATORY_TRACT
  Filled 2012-06-01: qty 5

## 2012-06-01 MED ORDER — SODIUM CHLORIDE 0.9 % IJ SOLN
3.0000 mL | Freq: Two times a day (BID) | INTRAMUSCULAR | Status: DC
  Administered 2012-06-01: 23:00:00 via INTRAVENOUS
  Administered 2012-06-03 – 2012-06-05 (×3): 3 mL via INTRAVENOUS

## 2012-06-01 MED ORDER — SODIUM CHLORIDE 0.9 % IJ SOLN
3.0000 mL | Freq: Two times a day (BID) | INTRAMUSCULAR | Status: DC
  Administered 2012-06-01 – 2012-06-02 (×2): 3 mL via INTRAVENOUS

## 2012-06-01 NOTE — ED Provider Notes (Signed)
History     CSN: [id number]  Arrival date & time 06/01/12  1624   First MD Initiated Contact with Patient 06/01/12 1627      No chief complaint on file.   (Consider location/radiation/quality/duration/timing/severity/associated sxs/prior treatment) HPI Comments: Patient comes to the ER for evaluation of shortness of breath. Patient reports that she awoke feeling short of breath this morning and her symptoms have persistently worsened through the course of the day. She has not had any change in her cough. There has not been any fever. Patient reports that she noticed some tightness in her chest when she was in a car coming to the ER, but that has resolved. No persistent chest pain. Patient has not identified any factors that make her shortness of breath worse. She did use her inhalers today without improvement.   Past Medical History  Diagnosis Date  . Carotid bruit     Right  . Diverticulosis of colon   . Hyperlipidemia   . History of nephrolithiasis   . Osteopenia   . COPD, moderate   . Cough   . Skipped beats   . Arthritis HANDS  . CAD (coronary artery disease) 09/12/2011    minimal nonobstructive CAD per cath with EF of 35 to 40% and normal filling pressures and right heart pressures  . LV dysfunction   . Chronic systolic CHF (congestive heart failure)     Past Surgical History  Procedure Date  . Cosmetic surgery     eyelids  . Left long finger arthroplasty & pulley relase 02-17-2005  . Cysto/ left retrograde pyelogram/ left ureteral stent placement 10-16-2001    STONE  . Pulmonary function analysis 07-31-2009    MODERATE TO SEVERE OBSTRUCTIVE AIRWAY DISEASE/ INSIGNIFICANT RESPONSE TO BRONCHODILATORS/ EMPHYSEMA PATTERN/ MILD DECREASE IN DIFFUSE CAPACITY FEF 25-75 CHANGED BY 8%  . Tubal ligation AGE 9'S  . Tonsillectomy CHILD  . Extracorporeal shock wave lithotripsy 2003  . Cataract extraction w/ intraocular lens  implant, bilateral 1998  . Transurethral resection of  bladder tumor 07/01/2011    Procedure: TRANSURETHRAL RESECTION OF BLADDER TUMOR (TURBT);  Surgeon: Lindaann Slough, MD;  Location: [facility];  Service: Urology;  Laterality: N/A;  CYSTO  . Cystoscopy 07/01/2011    Procedure: CYSTOSCOPY;  Surgeon: Lindaann Slough, MD;  Location: [facility];  Service: Urology;  Laterality: N/A;  retrieval l of bladder stone    Family History  Problem Relation Age of Onset  . Heart disease Mother   . Hypertension Mother   . Hypertension Father   . Heart attack Father   . Heart disease Father   . Thyroid disease Sister   . Heart attack Sister   . Stroke Sister   . COPD Sister   . Heart attack Brother   . Cancer Maternal Grandmother     breast cancer  . Diabetes Paternal Grandmother   . Asthma Neg Hx     History  Substance Use Topics  . Smoking status: Former Smoker -- 1.0 packs/day    Types: Cigarettes    Quit date: 09/27/2011  . Smokeless tobacco: Never Used     Comment: statrted age 15- 60, up to 1ppd for ? years  . Alcohol Use: No    OB History    Grav Para Term Preterm Abortions TAB SAB Ect Mult Living                  Review of Systems  Constitutional: Negative for fever.  Respiratory:  Positive for cough, chest tightness and shortness of breath.   All other systems reviewed and are negative.    Allergies  Shrimp; Aspirin; Barbiturates; Beta adrenergic blockers; Calcium channel blockers; Celecoxib; Codeine; Diltiazem hcl; Fluvastatin sodium; Ibandronate sodium; Rosuvastatin; Simvastatin; and Penicillins  Home Medications   Current Outpatient Rx  Name  Route  Sig  Dispense  Refill  . VITAMIN C 1000 MG PO TABS   Oral   Take 1,000 mg by mouth daily.          Marland Kitchen BINOSTO 70 MG PO TBEF               . BIOTIN PO   Oral   Take 1 capsule by mouth daily.         . BUDESONIDE-FORMOTEROL FUMARATE 160-4.5 MCG/ACT IN AERO   Inhalation   Inhale 2 puffs into the lungs 2 (two) times daily.   1  Inhaler   3   . CALCIUM CARBONATE 1500 MG PO TABS   Oral   Take 2.5 tablets by mouth daily.          Marland Kitchen VITAMIN D 1000 UNITS PO CAPS   Oral   Take 1,000 Units by mouth daily.          Marland Kitchen LOSARTAN POTASSIUM 50 MG PO TABS   Oral   Take 1 tablet (50 mg total) by mouth daily.   30 tablet   11   . ADULT MULTIVITAMIN W/MINERALS CH   Oral   Take 1 tablet by mouth daily.         Marland Kitchen VITAMIN E 400 UNITS PO CAPS   Oral   Take 400 Units by mouth daily.            BP 154/84  Pulse 109  Temp 98.2 F (36.8 C) (Oral)  Resp 16  SpO2 92%  Physical Exam  Constitutional: She is oriented to person, place, and time. She appears well-developed and well-nourished. She appears distressed.  HENT:  Head: Normocephalic and atraumatic.  Right Ear: Hearing normal.  Nose: Nose normal.  Mouth/Throat: Oropharynx is clear and moist and mucous membranes are normal.  Eyes: Conjunctivae normal and EOM are normal. Pupils are equal, round, and reactive to light.  Neck: Normal range of motion. Neck supple.  Cardiovascular: Normal rate, regular rhythm, S1 normal and S2 normal.  Exam reveals no gallop and no friction rub.   No murmur heard. Pulmonary/Chest: Accessory muscle usage present. Tachypnea noted. She has decreased breath sounds in the left upper field and the left lower field. She has wheezes in the right middle field, the right lower field, the left middle field and the left lower field. She has no rhonchi. She has no rales. She exhibits no tenderness.  Abdominal: Soft. Normal appearance and bowel sounds are normal. There is no hepatosplenomegaly. There is no tenderness. There is no rebound, no guarding, no tenderness at McBurney's point and negative Murphy's sign. No hernia.  Musculoskeletal: Normal range of motion.  Neurological: She is alert and oriented to person, place, and time. She has normal strength. No cranial nerve deficit or sensory deficit. Coordination normal. GCS eye subscore is  4. GCS verbal subscore is 5. GCS motor subscore is 6.  Skin: Skin is warm, dry and intact. No rash noted. No cyanosis.  Psychiatric: She has a normal mood and affect. Her speech is normal and behavior is normal. Thought content normal.    ED Course  Procedures (including critical care time)  Date: 06/01/2012  Rate: 111  Rhythm: sinus tachycardia and premature ventricular contractions (PVC)  QRS Axis: left  Intervals: normal  ST/T Wave abnormalities: nonspecific ST/T changes  Conduction Disutrbances:nonspecific intraventricular conduction delay  Narrative Interpretation:   Old EKG Reviewed: unchanged    Labs Reviewed  CBC WITH DIFFERENTIAL - Abnormal; Notable for the following:    WBC 16.9 (*)     Neutrophils Relative 84 (*)     Neutro Abs 14.2 (*)     Lymphocytes Relative 10 (*)     All other components within normal limits  BASIC METABOLIC PANEL - Abnormal; Notable for the following:    Glucose, Bld 103 (*)     GFR calc non Af Amer 82 (*)     All other components within normal limits  PRO B NATRIURETIC PEPTIDE - Abnormal; Notable for the following:    Pro B Natriuretic peptide (BNP) 800.1 (*)     All other components within normal limits  BLOOD GAS, ARTERIAL - Abnormal; Notable for the following:    pO2, Arterial 56.0 (*)     Bicarbonate 26.0 (*)     Acid-Base Excess 2.2 (*)     All other components within normal limits  TROPONIN I   Dg Chest 2 View  06/01/2012  *RADIOLOGY REPORT*  Clinical Data: Shortness of breath, weakness  CHEST - 2 VIEW  Comparison: Chest x-ray of 03/04/2012, 07/03/2009, and 11/12/2007  Findings: There is a spiculated nodular opacity within the right lung apex.  This could represent progressive right apical scarring, but a neoplasm cannot be excluded.  CT of the chest with IV contrast media is recommended to assess further.  The lungs are hyperaerated consistent with COPD.  Mediastinal contours are stable.  The heart is mildly enlarged and stable.  No  skeletal abnormality is noted.  IMPRESSION:  1.  Spiculated nodular opacity in the right apex medially. Possible scarring but cannot exclude neoplasm.  Recommend CT of the chest with IV contrast media. 2.  COPD.   Original Report Authenticated By: Dwyane Dee, M.D.      Diagnosis: 1. COPD exacerbation 2. Poss Pneumonia    MDM  Patient presented to the ER for evaluation of shortness of breath. She does have a previous history of COPD. Patient had mild wheezing but seem to be in some distress at arrival. She is hypoxic by blood gas. X-ray did not show any evidence of pneumonia, did show possible mass. CT angiography was performed to rule out PE as well as further evaluate the mass. CT shows dependent changes that could be infectious. Patient had previously been administered continuous albuterol and Cymetra. Levaquin was added and patient was admitted for further treatment.        Gilda Crease, MD 06/02/12 1520

## 2012-06-01 NOTE — Telephone Encounter (Signed)
Patient Information:  Caller Name: [place]  Phone: [phone]  Patient: Amber Snow  Gender: Female  DOB: 04/29/1932  Age: 79 Years  PCP: Marga Melnick  Office Follow Up:  Does the office need to follow up with this patient?: No  Instructions For The Office: N/A  RN Note:  Last used Albuterol approx 1330 with no improvement.  Feeling anxious about worsening shortness of breath.  Symptoms  Reason For Call & Symptoms: Emergent Call:  Shortness of breath  Reviewed Health History In EMR: N/A  Reviewed Medications In EMR: N/A  Reviewed Allergies In EMR: N/A  Reviewed Surgeries / Procedures: N/A  Date of Onset of Symptoms: 06/01/2012  Treatments Tried: Albuterol MDI, Spiriva, Symbicort  Treatments Tried Worked: No  Guideline(s) Used:  Breathing Difficulty  Disposition Per Guideline:   Go to ED Now  Reason For Disposition Reached:   Moderate difficulty breathing (e.g., speaks in phrases, SOB even at rest, pulse 100-120) of new onset or worse than normal  Advice Given:  N/A

## 2012-06-01 NOTE — H&P (Signed)
[facility] History and Physical  Amber Snow:[id number] DOB: 1932-10-23    PCP:   Marga Melnick, MD   Chief Complaint: shortness of breath.  HPI: Amber Snow is an 79 y.o. female with hx of moderate COPD, prior tobacco use, osteopenia, hyperlipidemia, chronic CHF, diastolic dysfx, CAD, presents to the ER with progressive SOB,wheezing and minimal coughs.  She denied CP, fever,or chills.  She doesn't use Oxygen at home.  Evaluation in the ER included normal renal fx tests, leukocytosis with WBC of 17K, Hb 14.5 g/DL, and a CTPA exluded a PE, but found an apical pulmonary nodule of 1cm and slight vascular congestion.   She was given nebs, IV solumedrol, oxygen, IV Levaquin, and hospitalist was asked to admit her for COPD exacerbation. Her ABG 7.45/39/ Pa02=56/ 1L [place].  Rewiew of Systems:  Constitutional: Negative for malaise, fever and chills. No significant weight loss or weight gain Eyes: Negative for eye pain, redness and discharge, diplopia, visual changes, or flashes of light. ENMT: Negative for ear pain, hoarseness, nasal congestion, sinus pressure and sore throat. No headaches; tinnitus, drooling, or problem swallowing. Cardiovascular: Negative for chest pain, palpitations, diaphoresis,and peripheral edema. ; No orthopnea, PND Respiratory: Negative for hemoptysis. No pleuritic chestpain. Gastrointestinal: Negative for nausea, vomiting, diarrhea, constipation, abdominal pain, melena, blood in stool, hematemesis, jaundice and rectal bleeding.    Genitourinary: Negative for frequency, dysuria, incontinence,flank pain and hematuria; Musculoskeletal: Negative for back pain and neck pain. Negative for swelling and trauma.;  Skin: . Negative for pruritus, rash, abrasions, bruising and skin lesion.; ulcerations Neuro: Negative for headache, lightheadedness and neck stiffness. Negative for weakness, altered level of consciousness , altered mental status, extremity weakness,  burning feet, involuntary movement, seizure and syncope.  Psych: negative for anxiety, depression, insomnia, tearfulness, panic attacks, hallucinations, paranoia, suicidal or homicidal ideation    Past Medical History  Diagnosis Date  . Carotid bruit     Right  . Diverticulosis of colon   . Hyperlipidemia   . History of nephrolithiasis   . Osteopenia   . COPD, moderate   . Cough   . Skipped beats   . Arthritis HANDS  . CAD (coronary artery disease) 09/12/2011    minimal nonobstructive CAD per cath with EF of 35 to 40% and normal filling pressures and right heart pressures  . LV dysfunction   . Chronic systolic CHF (congestive heart failure)     Past Surgical History  Procedure Date  . Cosmetic surgery     eyelids  . Left long finger arthroplasty & pulley relase 02-17-2005  . Cysto/ left retrograde pyelogram/ left ureteral stent placement 10-16-2001    STONE  . Pulmonary function analysis 07-31-2009    MODERATE TO SEVERE OBSTRUCTIVE AIRWAY DISEASE/ INSIGNIFICANT RESPONSE TO BRONCHODILATORS/ EMPHYSEMA PATTERN/ MILD DECREASE IN DIFFUSE CAPACITY FEF 25-75 CHANGED BY 8%  . Tubal ligation AGE 59'S  . Tonsillectomy CHILD  . Extracorporeal shock wave lithotripsy 2003  . Cataract extraction w/ intraocular lens  implant, bilateral 1998  . Transurethral resection of bladder tumor 07/01/2011    Procedure: TRANSURETHRAL RESECTION OF BLADDER TUMOR (TURBT);  Surgeon: Lindaann Slough, MD;  Location: [facility];  Service: Urology;  Laterality: N/A;  CYSTO  . Cystoscopy 07/01/2011    Procedure: CYSTOSCOPY;  Surgeon: Lindaann Slough, MD;  Location: [facility];  Service: Urology;  Laterality: N/A;  retrieval l of bladder stone    Medications:  HOME MEDS: Prior to Admission medications   Medication Sig Start  Date End Date Taking? Authorizing Provider  albuterol (PROVENTIL HFA;VENTOLIN HFA) 108 (90 BASE) MCG/ACT inhaler Inhale 2 puffs into the lungs every 6 (six)  hours as needed. Shortness of breath   Yes Historical Provider, MD  Alendronate Sodium (BINOSTO) 70 MG TBEF Take 1 tablet by mouth once a week. On thursdays   Yes Historical Provider, MD  Ascorbic Acid (VITAMIN C) 1000 MG tablet Take 1,000 mg by mouth daily.    Yes Historical Provider, MD  BIOTIN PO Take 1 capsule by mouth daily.   Yes Historical Provider, MD  budesonide-formoterol (SYMBICORT) 160-4.5 MCG/ACT inhaler Inhale 2 puffs into the lungs 2 (two) times daily. 03/04/12  Yes Pecola Lawless, MD  Calcium Carbonate (CALTRATE 600) 1500 MG TABS Take 2.5 tablets by mouth daily.    Yes Historical Provider, MD  Cholecalciferol (VITAMIN D) 1000 UNITS capsule Take 1,000 Units by mouth daily.    Yes Historical Provider, MD  losartan (COZAAR) 50 MG tablet Take 50 mg by mouth every evening. 08/29/11 08/28/12 Yes Osborne Serio M [place], MD  Multiple Vitamin (MULITIVITAMIN WITH MINERALS) TABS Take 1 tablet by mouth daily.   Yes Historical Provider, MD  pravastatin (PRAVACHOL) 40 MG tablet Take 40 mg by mouth at bedtime.   Yes Historical Provider, MD  tiotropium (SPIRIVA) 18 MCG inhalation capsule Place 18 mcg into inhaler and inhale daily.   Yes Historical Provider, MD  traZODone (DESYREL) 50 MG tablet Take 50 mg by mouth at bedtime.   Yes Historical Provider, MD  vitamin E 400 UNIT capsule Take 400 Units by mouth daily.    Yes Historical Provider, MD     Allergies:  Allergies  Allergen Reactions  . Shrimp (Shellfish Allergy) Swelling  . Aspirin     REACTION: nausea  . Barbiturates     REACTION: rash  . Beta Adrenergic Blockers     PT STATES TOLD TO AVOID DUE TO COUGH REACTION TO CARDIZEM  . Calcium Channel Blockers     PER PT TOLD TO AVOID DUE TO COUGH REACTION TO CARDIZEM  . Celecoxib     REACTION: nausea  . Codeine     REACTION: excessive sleep  . Diltiazem Hcl Other (See Comments)    REACTION: cough  . Fluvastatin Sodium     REACTION: intolerance  . Ibandronate Sodium Other (See Comments)     REACTION: HAIR LOSS/NAIL CHANGES  . Rosuvastatin     REACTION: intolerance  . Simvastatin     REACTION: intolerance  . Penicillins Rash    Social History:   reports that she quit smoking about 8 months ago. Her smoking use included Cigarettes. She smoked 1 pack per day. She has never used smokeless tobacco. She reports that she does not drink alcohol or use illicit drugs.  Family History: Family History  Problem Relation Age of Onset  . Heart disease Mother   . Hypertension Mother   . Hypertension Father   . Heart attack Father   . Heart disease Father   . Thyroid disease Sister   . Heart attack Sister   . Stroke Sister   . COPD Sister   . Heart attack Brother   . Cancer Maternal Grandmother     breast cancer  . Diabetes Paternal Grandmother   . Asthma Neg Hx      Physical Exam: Filed Vitals:   06/01/12 1635 06/01/12 1722  BP: 154/84   Pulse: 109   Temp: 98.2 F (36.8 C)   TempSrc: Oral   Resp:  16   SpO2: 92% 93%   Blood pressure 154/84, pulse 109, temperature 98.2 F (36.8 C), temperature source Oral, resp. rate 16, SpO2 93.00%.  GEN:  Pleasant patient lying in the stretcher in no acute distress; cooperative with exam. PSYCH:  alert and oriented x4; does not appear anxious or depressed; affect is appropriate. HEENT: Mucous membranes pink and anicteric; PERRLA; EOM intact; no cervical lymphadenopathy nor thyromegaly or carotid bruit; no JVD; There were no stridor. Neck is very supple. Breasts:: Not examined CHEST WALL: No tenderness CHEST: Normal respiration, clear to auscultation bilaterally, but decreased BS. HEART: Regular rate and rhythm.  There are no murmur, rub, or gallops.   BACK: No kyphosis or scoliosis; no CVA tenderness ABDOMEN: soft and non-tender; no masses, no organomegaly, normal abdominal bowel sounds; no pannus; no intertriginous candida. There is no rebound and no distention. Rectal Exam: Not done EXTREMITIES: No bone or joint deformity;  age-appropriate arthropathy of the hands and knees; no edema; no ulcerations.  There is no calf tenderness. Genitalia: not examined PULSES: 2+ and symmetric SKIN: Normal hydration no rash or ulceration CNS: Cranial nerves 2-12 grossly intact no focal lateralizing neurologic deficit.  Speech is fluent; uvula elevated with phonation, facial symmetry and tongue midline. DTR are normal bilaterally, cerebella exam is intact, barbinski is negative and strengths are equaled bilaterally.  No sensory loss.   Labs on Admission:  Basic Metabolic Panel:  Lab 06/01/12 1191  NA 141  K 3.9  CL 103  CO2 26  GLUCOSE 103*  BUN 22  CREATININE 0.69  CALCIUM 9.7  MG --  PHOS --   Liver Function Tests: No results found for this basename: AST:5,ALT:5,ALKPHOS:5,BILITOT:5,PROT:5,ALBUMIN:5 in the last 168 hours No results found for this basename: LIPASE:5,AMYLASE:5 in the last 168 hours No results found for this basename: AMMONIA:5 in the last 168 hours CBC:  Lab 06/01/12 1640  WBC 16.9*  NEUTROABS 14.2*  HGB 14.5  HCT 42.5  MCV 94.9  PLT 241   Cardiac Enzymes:  Lab 06/01/12 1640  CKTOTAL --  CKMB --  CKMBINDEX --  TROPONINI <0.30    CBG: No results found for this basename: GLUCAP:5 in the last 168 hours   Radiological Exams on Admission: Dg Chest 2 View  06/01/2012  *RADIOLOGY REPORT*  Clinical Data: Shortness of breath, weakness  CHEST - 2 VIEW  Comparison: Chest x-ray of 03/04/2012, 07/03/2009, and 11/12/2007  Findings: There is a spiculated nodular opacity within the right lung apex.  This could represent progressive right apical scarring, but a neoplasm cannot be excluded.  CT of the chest with IV contrast media is recommended to assess further.  The lungs are hyperaerated consistent with COPD.  Mediastinal contours are stable.  The heart is mildly enlarged and stable.  No skeletal abnormality is noted.  IMPRESSION:  1.  Spiculated nodular opacity in the right apex medially. Possible  scarring but cannot exclude neoplasm.  Recommend CT of the chest with IV contrast media. 2.  COPD.   Original Report Authenticated By: Dwyane Dee, M.D.    Ct Angio Chest W/cm &/or Wo Cm  06/01/2012  *RADIOLOGY REPORT*  Clinical Data: Shortness of breath.  Evaluate pulmonary embolus. Evaluate spiculated mass.  The patient presented to the ED with shortness of breath which began today.  No chest pain.  CT ANGIOGRAPHY CHEST  Technique:  Multidetector CT imaging of the chest using the standard protocol during bolus administration of intravenous contrast. Multiplanar reconstructed images including MIPs were obtained and  reviewed to evaluate the vascular anatomy.  Contrast: 80mL OMNIPAQUE IOHEXOL 350 MG/ML SOLN  Comparison: CT of the chest on 04/26/2004, chest x-ray 06/01/2012  Findings: In the right lung apex, there is a rounded, 1 cm nodule in the area of pleural parenchymal changes.  Although the findings may be related to scar, is difficult to entirely exclude a pathologic process.  At the left lung apex, benign pleural parenchymal changes are identified.  There is central lobular and the semen throughout the lungs bilaterally.  Within the dependent aspects of the lungs in the bases, there is infiltrate, most consistent with pulmonary edema or infectious infiltrate, right greater than left.  The pulmonary arteries are well opacified, showing no evidence for pulmonary embolus. No mediastinal, hilar, or axillary adenopathy. The visualized portion of the thyroid gland has a normal appearance.  Images of the upper abdomen are unremarkable.  Visualized osseous structures have a normal appearance.  IMPRESSION:  1.  Technically adequate exam showing no evidence for acute pulmonary embolus. 2.  Dependent changes in the lung bases bilaterally, favoring edema or infectious process. 3.  Right apical lung nodule is 1.0 cm in diameter. Malignancy cannot be excluded.  Consider cardiothoracic referral for further evaluation. 4.   Significant emphysema.   Original Report Authenticated By: Norva Pavlov, M.D.     EKG: Independently reviewed. Sinus tach with LBBB.   Assessment/Plan Present on Admission:  . COPD . LV dysfunction . Lung nodule . LOW BACK PAIN, CHRONIC . HYPERLIPIDEMIA . LEFT BUNDLE BRANCH BLOCK   PLAN:  Will admit her for COPD exacerbation.  She will receive IV Levaquin, IV steroids, nebs, and supplemental oxygen. I will give her a one dose of lasix IV.   Her pulmonary nodule will need careful follow up as well, and I told her and her husband about this finding and the importance of follow up to exclude malignancy ( "lung cancer").   I have continued her home meds. She is stable, full code (comfirmed tonight), and will be admitted to [facility] service.  Thank you for allowing me to partake in the care of your nice patient.  Other plans as per orders.  Code Status: FULL Unk Lightning, MD. [facility] Pager [phone] 7pm to 7am.  06/01/2012, 8:43 PM

## 2012-06-01 NOTE — ED Notes (Signed)
Patient transported to CT 

## 2012-06-01 NOTE — ED Notes (Signed)
Pt presenting to ed with c/o shortness of breath onset today. Pt denies chest pain at this time.

## 2012-06-02 DIAGNOSIS — I428 Other cardiomyopathies: Secondary | ICD-10-CM

## 2012-06-02 DIAGNOSIS — J441 Chronic obstructive pulmonary disease with (acute) exacerbation: Secondary | ICD-10-CM

## 2012-06-02 DIAGNOSIS — D72829 Elevated white blood cell count, unspecified: Secondary | ICD-10-CM

## 2012-06-02 LAB — CREATININE, SERUM
Creatinine, Ser: 0.58 mg/dL (ref 0.50–1.10)
GFR calc Af Amer: >90 mL/min (ref >90)

## 2012-06-02 LAB — CBC
Platelets: 227 10*3/uL (ref 150–400)
RBC: 4.49 MIL/uL (ref 3.87–5.11)
RDW: 13.2 % (ref 11.5–15.5)
WBC: 24.8 10*3/uL — ABNORMAL HIGH (ref 4.0–10.5)

## 2012-06-02 LAB — TSH: TSH: 0.363 u[IU]/mL (ref 0.350–4.500)

## 2012-06-02 MED ORDER — METHYLPREDNISOLONE SODIUM SUCC 125 MG IJ SOLR
80.0000 mg | Freq: Four times a day (QID) | INTRAMUSCULAR | Status: DC
  Administered 2012-06-02 – 2012-06-03 (×4): 80 mg via INTRAVENOUS
  Filled 2012-06-02 (×7): qty 1.28

## 2012-06-02 NOTE — Progress Notes (Signed)
[facility] PROGRESS NOTE  Amber Snow YQM:[id number] DOB: 10/07/1932 DOA: 06/01/2012 PCP: Marga Melnick, MD  Assessment/Plan: COPD exacerbation -Increase Solu-Medrol 80 mg every 6 hours -Continue every 6 hour albuterol-And when necessary for shortness of breath -Continue levofloxacin  Nonischemic cardiomyopathy  -Ejection fraction 35-40%  -Continue losartan  Left bundle-branch block  -Appears to be chronic  -Heart catheterization on 09/12/2011 revealed minimal nonobstructive coronary disease Hyperlipidemia  -Continue Spiriva  Lung nodule -Will need outpatient followup/PET scan  Family Communication:   Pt husband at beside Disposition Plan:   Home when medically stable    Antibiotics:  Levofloxacin 06/01/2012>>>    Procedures/Studies: Dg Chest 2 View  06/01/2012  *RADIOLOGY REPORT*  Clinical Data: Shortness of breath, weakness  CHEST - 2 VIEW  Comparison: Chest x-ray of 03/04/2012, 07/03/2009, and 11/12/2007  Findings: There is a spiculated nodular opacity within the right lung apex.  This could represent progressive right apical scarring, but a neoplasm cannot be excluded.  CT of the chest with IV contrast media is recommended to assess further.  The lungs are hyperaerated consistent with COPD.  Mediastinal contours are stable.  The heart is mildly enlarged and stable.  No skeletal abnormality is noted.  IMPRESSION:  1.  Spiculated nodular opacity in the right apex medially. Possible scarring but cannot exclude neoplasm.  Recommend CT of the chest with IV contrast media. 2.  COPD.   Original Report Authenticated By: Dwyane Dee, M.D.    Ct Angio Chest W/cm &/or Wo Cm  06/01/2012  *RADIOLOGY REPORT*  Clinical Data: Shortness of breath.  Evaluate pulmonary embolus. Evaluate spiculated mass.  The patient presented to the ED with shortness of breath which began today.  No chest pain.  CT ANGIOGRAPHY CHEST  Technique:  Multidetector CT imaging of the chest using the standard  protocol during bolus administration of intravenous contrast. Multiplanar reconstructed images including MIPs were obtained and reviewed to evaluate the vascular anatomy.  Contrast: 80mL OMNIPAQUE IOHEXOL 350 MG/ML SOLN  Comparison: CT of the chest on 04/26/2004, chest x-ray 06/01/2012  Findings: In the right lung apex, there is a rounded, 1 cm nodule in the area of pleural parenchymal changes.  Although the findings may be related to scar, is difficult to entirely exclude a pathologic process.  At the left lung apex, benign pleural parenchymal changes are identified.  There is central lobular and the semen throughout the lungs bilaterally.  Within the dependent aspects of the lungs in the bases, there is infiltrate, most consistent with pulmonary edema or infectious infiltrate, right greater than left.  The pulmonary arteries are well opacified, showing no evidence for pulmonary embolus. No mediastinal, hilar, or axillary adenopathy. The visualized portion of the thyroid gland has a normal appearance.  Images of the upper abdomen are unremarkable.  Visualized osseous structures have a normal appearance.  IMPRESSION:  1.  Technically adequate exam showing no evidence for acute pulmonary embolus. 2.  Dependent changes in the lung bases bilaterally, favoring edema or infectious process. 3.  Right apical lung nodule is 1.0 cm in diameter. Malignancy cannot be excluded.  Consider cardiothoracic referral for further evaluation. 4.  Significant emphysema.   Original Report Authenticated By: Norva Pavlov, M.D.          Subjective: Patient is breathing 25-50% better. She denies any fevers, chills, chest pain, nausea, vomiting, diarrhea, abdominal pain, dysuria, hematuria.patient denies orthopnea, PND, worsening peripheral edema.  Objective: Filed Vitals:   06/02/12 0532 06/02/12 5284 06/02/12 1450 06/02/12 1854  BP:   135/56   Pulse:   102   Temp:   97.5 F (36.4 C)   TempSrc:   Oral   Resp:   22    Height:      Weight:      SpO2: 92% 94% 97% 95%    Intake/Output Summary (Last 24 hours) at 06/02/12 1911 Last data filed at 06/02/12 1450  Gross per 24 hour  Intake    900 ml  Output   3050 ml  Net  -2150 ml   Weight change:  Exam:   General:  Pt is alert, follows commands appropriately, not in acute distress  HEENT: No icterus, No thrush, No neck mass, [place]/AT  Cardiovascular: RRR, S1/S2, no rubs, no gallops  Respiratory: Bibasilar crackles. Bilateral expiratory wheezes. Good air movement.   Abdomen: Soft/+BS, non tender, non distended, no guarding  Extremities: No edema, No lymphangitis, No petechiae, No rashes, no synovitis  Data Reviewed: Basic Metabolic Panel:  Lab 06/01/12 1610 06/01/12 1640  NA -- 141  K -- 3.9  CL -- 103  CO2 -- 26  GLUCOSE -- 103*  BUN -- 22  CREATININE 0.58 0.69  CALCIUM -- 9.7  MG -- --  PHOS -- --   Liver Function Tests: No results found for this basename: AST:5,ALT:5,ALKPHOS:5,BILITOT:5,PROT:5,ALBUMIN:5 in the last 168 hours No results found for this basename: LIPASE:5,AMYLASE:5 in the last 168 hours No results found for this basename: AMMONIA:5 in the last 168 hours CBC:  Lab 06/01/12 2330 06/01/12 1640  WBC 24.8* 16.9*  NEUTROABS -- 14.2*  HGB 14.7 14.5  HCT 42.1 42.5  MCV 93.8 94.9  PLT 227 241   Cardiac Enzymes:  Lab 06/01/12 1640  CKTOTAL --  CKMB --  CKMBINDEX --  TROPONINI <0.30   BNP: No components found with this basename: POCBNP:5 CBG: No results found for this basename: GLUCAP:5 in the last 168 hours  No results found for this or any previous visit (from the past 240 hour(s)).   Scheduled Meds:   . albuterol  2.5 mg Nebulization Q6H  . budesonide-formoterol  2 puff Inhalation BID  . calcium carbonate  2.5 tablet Oral Daily  . cholecalciferol  1,000 Units Oral Daily  . docusate sodium  100 mg Oral BID  . enoxaparin (LOVENOX) injection  40 mg Subcutaneous QHS  . levofloxacin (LEVAQUIN) IV  500 mg  Intravenous Q24H  . losartan  50 mg Oral QPM  . methylPREDNISolone (SOLU-MEDROL) injection  40 mg Intravenous Q6H  . multivitamin with minerals  1 tablet Oral Daily  . pravastatin  40 mg Oral QHS  . sodium chloride  3 mL Intravenous Q12H  . sodium chloride  3 mL Intravenous Q12H  . tiotropium  18 mcg Inhalation Daily  . traZODone  50 mg Oral QHS   Continuous Infusions:    Macguire Holsinger, DO  [facility] Pager [phone]  If 7PM-7AM, please contact night-coverage [email or web address] Password [email or web address] 06/02/2012, 7:11 PM   LOS: 1 day

## 2012-06-02 NOTE — Progress Notes (Signed)
   CARE MANAGEMENT NOTE 06/02/2012  Patient:  Amber Snow, Amber Snow   Account Number:  [id number]  Date Initiated:  06/02/2012  Documentation initiated by:  Jiles Crocker  Subjective/Objective Assessment:   ADMITTED WITH COPD     Action/Plan:   PCP IS DR Chrissie Noa HOPPER  LIVES AT HOME WITH SPOUSE; CM WILL FOLLOW FOR DCP; POSSIBLY NEED HHC AT DISCHARGE   Anticipated DC Date:  06/09/2012   Anticipated DC Plan:  HOME W HOME HEALTH SERVICES      DC Planning Services  CM consult              Status of service:  In process, will continue to follow Medicare Important Message given?  NA - LOS <3 / Initial given by admissions (If response is "NO", the following Medicare IM given date fields will be blank)  Per UR Regulation:  Reviewed for med. necessity/level of care/duration of stay  Comments:  06/02/2012- B [place] RN,BSN,MHA

## 2012-06-03 LAB — BASIC METABOLIC PANEL
BUN: 28 mg/dL — ABNORMAL HIGH (ref 6–23)
Calcium: 9 mg/dL (ref 8.4–10.5)
Creatinine, Ser: 0.76 mg/dL (ref 0.50–1.10)
GFR calc Af Amer: >90 mL/min (ref >90)

## 2012-06-03 LAB — CBC
HCT: 35.4 % — ABNORMAL LOW (ref 36.0–46.0)
MCHC: 35.3 g/dL (ref 30.0–36.0)
MCV: 93.7 fL (ref 78.0–100.0)
Platelets: 219 10*3/uL (ref 150–400)
RDW: 13.7 % (ref 11.5–15.5)
WBC: 20.7 10*3/uL — ABNORMAL HIGH (ref 4.0–10.5)

## 2012-06-03 MED ORDER — LEVOFLOXACIN 750 MG PO TABS
750.0000 mg | ORAL_TABLET | ORAL | Status: DC
  Administered 2012-06-04: 750 mg via ORAL
  Filled 2012-06-03: qty 1

## 2012-06-03 MED ORDER — METHYLPREDNISOLONE SODIUM SUCC 40 MG IJ SOLR
40.0000 mg | Freq: Four times a day (QID) | INTRAMUSCULAR | Status: DC
  Administered 2012-06-03 – 2012-06-04 (×4): 40 mg via INTRAVENOUS
  Filled 2012-06-03 (×7): qty 1

## 2012-06-03 NOTE — Progress Notes (Addendum)
[facility] PROGRESS NOTE  Amber Snow:[id number] DOB: 13-Sep-1932 DOA: 06/01/2012 PCP: Marga Melnick, MD  Assessment/Plan: COPD exacerbation  -Wheezing has improved with increased Solu-Medrol -Will decrease dose today -Continue every 6 hour albuterol-And when necessary for shortness of breath  -Continue levofloxacin--adjust Levaquin dose for renal function -I believe that her continuing dyspnea on exertion is a combination of her COPD exacerbation, cardiomyopathy, and deconditioning. Nonischemic cardiomyopathy  -Ejection fraction 35-40%  -Continue losartan  Elevated proBNP -Likely related to the patient's cardiomyopathy -Clinically, patient is not fluid overloaded Left bundle-branch block  -Appears to be chronic  -Heart catheterization on 09/12/2011 revealed minimal nonobstructive coronary disease  Hyperlipidemia  -Continue pravastatin Lung nodule  -Will need outpatient followup/PET scan     Family Communication:   Pt at beside Disposition Plan:   Home when medically stable   Antibiotics:  Levofloxacin 06/01/2012>>>     Procedures/Studies: Dg Chest 2 View  06/01/2012  *RADIOLOGY REPORT*  Clinical Data: Shortness of breath, weakness  CHEST - 2 VIEW  Comparison: Chest x-ray of 03/04/2012, 07/03/2009, and 11/12/2007  Findings: There is a spiculated nodular opacity within the right lung apex.  This could represent progressive right apical scarring, but a neoplasm cannot be excluded.  CT of the chest with IV contrast media is recommended to assess further.  The lungs are hyperaerated consistent with COPD.  Mediastinal contours are stable.  The heart is mildly enlarged and stable.  No skeletal abnormality is noted.  IMPRESSION:  1.  Spiculated nodular opacity in the right apex medially. Possible scarring but cannot exclude neoplasm.  Recommend CT of the chest with IV contrast media. 2.  COPD.   Original Report Authenticated By: Dwyane Dee, M.D.    Ct Angio Chest W/cm  &/or Wo Cm  06/01/2012  *RADIOLOGY REPORT*  Clinical Data: Shortness of breath.  Evaluate pulmonary embolus. Evaluate spiculated mass.  The patient presented to the ED with shortness of breath which began today.  No chest pain.  CT ANGIOGRAPHY CHEST  Technique:  Multidetector CT imaging of the chest using the standard protocol during bolus administration of intravenous contrast. Multiplanar reconstructed images including MIPs were obtained and reviewed to evaluate the vascular anatomy.  Contrast: 80mL OMNIPAQUE IOHEXOL 350 MG/ML SOLN  Comparison: CT of the chest on 04/26/2004, chest x-ray 06/01/2012  Findings: In the right lung apex, there is a rounded, 1 cm nodule in the area of pleural parenchymal changes.  Although the findings may be related to scar, is difficult to entirely exclude a pathologic process.  At the left lung apex, benign pleural parenchymal changes are identified.  There is central lobular and the semen throughout the lungs bilaterally.  Within the dependent aspects of the lungs in the bases, there is infiltrate, most consistent with pulmonary edema or infectious infiltrate, right greater than left.  The pulmonary arteries are well opacified, showing no evidence for pulmonary embolus. No mediastinal, hilar, or axillary adenopathy. The visualized portion of the thyroid gland has a normal appearance.  Images of the upper abdomen are unremarkable.  Visualized osseous structures have a normal appearance.  IMPRESSION:  1.  Technically adequate exam showing no evidence for acute pulmonary embolus. 2.  Dependent changes in the lung bases bilaterally, favoring edema or infectious process. 3.  Right apical lung nodule is 1.0 cm in diameter. Malignancy cannot be excluded.  Consider cardiothoracic referral for further evaluation. 4.  Significant emphysema.   Original Report Authenticated By: Norva Pavlov, M.D.  Subjective: Patient states that she is breathing 50% better than the very first  day. She denies any fevers, chills, chest pain, nausea, vomiting, diarrhea, abdominal pain, dysuria, hematuria, syncope.  Objective: Filed Vitals:   06/03/12 0444 06/03/12 0849 06/03/12 1412 06/03/12 1418  BP: 119/52  134/54   Pulse: 93  94   Temp: 97.9 F (36.6 C)  98.4 F (36.9 C)   TempSrc: Oral  Oral   Resp: 22  22   Height:      Weight: 46.675 kg (102 lb 14.4 oz)     SpO2: 95% 93% 97% 95%    Intake/Output Summary (Last 24 hours) at 06/03/12 1628 Last data filed at 06/03/12 1300  Gross per 24 hour  Intake    480 ml  Output    700 ml  Net   -220 ml   Weight change: -1.406 kg (-3 lb 1.6 oz) Exam:   General:  Pt is alert, follows commands appropriately, not in acute distress  HEENT: No icterus, No thrush, [place]/AT  Cardiovascular: RRR, S1/S2, no rubs, no gallops  Respiratory: Bilateral crackles, left greater than right. No wheezes. Good air movement.  Abdomen: Soft/+BS, non tender, non distended, no guarding  Extremities: No edema, No lymphangitis, No petechiae, No rashes, no synovitis  Data Reviewed: Basic Metabolic Panel:  Lab 06/03/12 0981 06/01/12 2330 06/01/12 1640  NA 138 -- 141  K 3.9 -- 3.9  CL 103 -- 103  CO2 24 -- 26  GLUCOSE 157* -- 103*  BUN 28* -- 22  CREATININE 0.76 0.58 0.69  CALCIUM 9.0 -- 9.7  MG -- -- --  PHOS -- -- --   Liver Function Tests: No results found for this basename: AST:5,ALT:5,ALKPHOS:5,BILITOT:5,PROT:5,ALBUMIN:5 in the last 168 hours No results found for this basename: LIPASE:5,AMYLASE:5 in the last 168 hours No results found for this basename: AMMONIA:5 in the last 168 hours CBC:  Lab 06/03/12 0425 06/01/12 2330 06/01/12 1640  WBC 20.7* 24.8* 16.9*  NEUTROABS -- -- 14.2*  HGB 12.5 14.7 14.5  HCT 35.4* 42.1 42.5  MCV 93.7 93.8 94.9  PLT 219 227 241   Cardiac Enzymes:  Lab 06/01/12 1640  CKTOTAL --  CKMB --  CKMBINDEX --  TROPONINI <0.30   BNP: No components found with this basename: POCBNP:5 CBG: No results  found for this basename: GLUCAP:5 in the last 168 hours  Recent Results (from the past 240 hour(s))  CULTURE, BLOOD (ROUTINE X 2)     Status: Normal (Preliminary result)   Collection Time   06/01/12  7:55 PM      Component Value Range Status Comment   Specimen Description BLOOD RTFA   Final    Special Requests NONE BOTTLES DRAWN AEROBIC AND ANAEROBIC 5CC   Final    Culture  Setup Time 06/02/2012 02:01   Final    Culture     Final    Value:        BLOOD CULTURE RECEIVED NO GROWTH TO DATE CULTURE WILL BE HELD FOR 5 DAYS BEFORE ISSUING A FINAL NEGATIVE REPORT   Report Status PENDING   Incomplete   CULTURE, BLOOD (ROUTINE X 2)     Status: Normal (Preliminary result)   Collection Time   06/01/12  8:00 PM      Component Value Range Status Comment   Specimen Description BLOOD RIGHT HAND   Final    Special Requests NONE BOTTLES DRAWN AEROBIC ONLY 4CC   Final    Culture  Setup Time 06/02/2012  02:03   Final    Culture     Final    Value:        BLOOD CULTURE RECEIVED NO GROWTH TO DATE CULTURE WILL BE HELD FOR 5 DAYS BEFORE ISSUING A FINAL NEGATIVE REPORT   Report Status PENDING   Incomplete      Scheduled Meds:   . albuterol  2.5 mg Nebulization Q6H  . budesonide-formoterol  2 puff Inhalation BID  . calcium carbonate  2.5 tablet Oral Daily  . cholecalciferol  1,000 Units Oral Daily  . docusate sodium  100 mg Oral BID  . enoxaparin (LOVENOX) injection  40 mg Subcutaneous QHS  . levofloxacin (LEVAQUIN) IV  500 mg Intravenous Q24H  . losartan  50 mg Oral QPM  . methylPREDNISolone (SOLU-MEDROL) injection  80 mg Intravenous Q6H  . multivitamin with minerals  1 tablet Oral Daily  . pravastatin  40 mg Oral QHS  . sodium chloride  3 mL Intravenous Q12H  . sodium chloride  3 mL Intravenous Q12H  . tiotropium  18 mcg Inhalation Daily  . traZODone  50 mg Oral QHS   Continuous Infusions:    Heran Campau, DO  [facility] Pager [phone]  If 7PM-7AM, please contact  night-coverage [email or web address] Password [email or web address] 06/03/2012, 4:28 PM   LOS: 2 days

## 2012-06-04 DIAGNOSIS — R0609 Other forms of dyspnea: Secondary | ICD-10-CM

## 2012-06-04 LAB — CBC
MCH: 31.9 pg (ref 26.0–34.0)
MCHC: 33.8 g/dL (ref 30.0–36.0)
MCV: 94.5 fL (ref 78.0–100.0)
Platelets: 215 10*3/uL (ref 150–400)
RBC: 3.79 MIL/uL — ABNORMAL LOW (ref 3.87–5.11)
RDW: 13.6 % (ref 11.5–15.5)

## 2012-06-04 LAB — BASIC METABOLIC PANEL
BUN: 30 mg/dL — ABNORMAL HIGH (ref 6–23)
Chloride: 103 mEq/L (ref 96–112)
Creatinine, Ser: 0.69 mg/dL (ref 0.50–1.10)
GFR calc Af Amer: >90 mL/min (ref >90)
GFR calc non Af Amer: 82 mL/min — ABNORMAL LOW (ref >90)
Potassium: 3.8 mEq/L (ref 3.5–5.1)

## 2012-06-04 LAB — HEMOGLOBIN A1C: Hgb A1c MFr Bld: 5.8 % — ABNORMAL HIGH (ref <5.7)

## 2012-06-04 MED ORDER — LIDOCAINE 5 % EX PTCH
1.0000 | MEDICATED_PATCH | CUTANEOUS | Status: DC
  Administered 2012-06-04: 1 via TRANSDERMAL
  Filled 2012-06-04 (×2): qty 1

## 2012-06-04 MED ORDER — METHYLPREDNISOLONE SODIUM SUCC 40 MG IJ SOLR
40.0000 mg | Freq: Once | INTRAMUSCULAR | Status: AC
  Administered 2012-06-04: 40 mg via INTRAVENOUS
  Filled 2012-06-04: qty 1

## 2012-06-04 MED ORDER — PREDNISONE 50 MG PO TABS
60.0000 mg | ORAL_TABLET | Freq: Every day | ORAL | Status: DC
  Administered 2012-06-05: 60 mg via ORAL
  Filled 2012-06-04 (×2): qty 1

## 2012-06-04 NOTE — Progress Notes (Signed)
[facility] PROGRESS NOTE  ZANASIA HICKSON EAV:[id number] DOB: 11/05/1932 DOA: 06/01/2012 PCP: Marga Melnick, MD  Assessment/Plan: COPD exacerbation  -Wheezing has improved with Solu-Medrol  -Plan to change to by mouth prednisone in the morning  -Continue every 6 hour albuterol-And when necessary for shortness of breath  -Continue levofloxacin--adjust Levaquin dose for renal function  -I believe that her continuing dyspnea on exertion is a combination of her COPD exacerbation, cardiomyopathy, and deconditioning.  -Plan for DC home 06/05/2012 if clinically stable Nonischemic cardiomyopathy  -Ejection fraction 35-40%  -Continue losartan  Elevated proBNP  -Likely related to the patient's cardiomyopathy  -Clinically, patient is not fluid overloaded  Left bundle-branch block  -Appears to be chronic  -Heart catheterization on 09/12/2011 revealed minimal nonobstructive coronary disease  Hyperlipidemia  -Continue pravastatin  Lung nodule  -Will need outpatient followup/PET scan     Family Communication:   Pt at beside Disposition Plan:   Home when medically stable   Antibiotics:  Levofloxacin 06/01/2012>>>       Procedures/Studies: Dg Chest 2 View  06/01/2012  *RADIOLOGY REPORT*  Clinical Data: Shortness of breath, weakness  CHEST - 2 VIEW  Comparison: Chest x-ray of 03/04/2012, 07/03/2009, and 11/12/2007  Findings: There is a spiculated nodular opacity within the right lung apex.  This could represent progressive right apical scarring, but a neoplasm cannot be excluded.  CT of the chest with IV contrast media is recommended to assess further.  The lungs are hyperaerated consistent with COPD.  Mediastinal contours are stable.  The heart is mildly enlarged and stable.  No skeletal abnormality is noted.  IMPRESSION:  1.  Spiculated nodular opacity in the right apex medially. Possible scarring but cannot exclude neoplasm.  Recommend CT of the chest with IV contrast media. 2.   COPD.   Original Report Authenticated By: Dwyane Dee, M.D.    Ct Angio Chest W/cm &/or Wo Cm  06/01/2012  *RADIOLOGY REPORT*  Clinical Data: Shortness of breath.  Evaluate pulmonary embolus. Evaluate spiculated mass.  The patient presented to the ED with shortness of breath which began today.  No chest pain.  CT ANGIOGRAPHY CHEST  Technique:  Multidetector CT imaging of the chest using the standard protocol during bolus administration of intravenous contrast. Multiplanar reconstructed images including MIPs were obtained and reviewed to evaluate the vascular anatomy.  Contrast: 80mL OMNIPAQUE IOHEXOL 350 MG/ML SOLN  Comparison: CT of the chest on 04/26/2004, chest x-ray 06/01/2012  Findings: In the right lung apex, there is a rounded, 1 cm nodule in the area of pleural parenchymal changes.  Although the findings may be related to scar, is difficult to entirely exclude a pathologic process.  At the left lung apex, benign pleural parenchymal changes are identified.  There is central lobular and the semen throughout the lungs bilaterally.  Within the dependent aspects of the lungs in the bases, there is infiltrate, most consistent with pulmonary edema or infectious infiltrate, right greater than left.  The pulmonary arteries are well opacified, showing no evidence for pulmonary embolus. No mediastinal, hilar, or axillary adenopathy. The visualized portion of the thyroid gland has a normal appearance.  Images of the upper abdomen are unremarkable.  Visualized osseous structures have a normal appearance.  IMPRESSION:  1.  Technically adequate exam showing no evidence for acute pulmonary embolus. 2.  Dependent changes in the lung bases bilaterally, favoring edema or infectious process. 3.  Right apical lung nodule is 1.0 cm in diameter. Malignancy cannot be excluded.  Consider cardiothoracic  referral for further evaluation. 4.  Significant emphysema.   Original Report Authenticated By: Norva Pavlov, M.D.           Subjective: Patient states that her breathing continues to improve. Denies any fevers, chills, chest pain, nausea, vomiting, diarrhea, vomiting, dysuria. Denies any hemoptysis.  Objective: Filed Vitals:   06/04/12 1610 06/04/12 0827 06/04/12 1405 06/04/12 1420  BP:   130/59   Pulse:   77   Temp:   98.5 F (36.9 C)   TempSrc:   Oral   Resp:   20   Height:      Weight:      SpO2: 94% 92% 94% 90%    Intake/Output Summary (Last 24 hours) at 06/04/12 1734 Last data filed at 06/04/12 1328  Gross per 24 hour  Intake    840 ml  Output    900 ml  Net    -60 ml   Weight change: 0.544 kg (1 lb 3.2 oz) Exam:   General:  Pt is alert, follows commands appropriately, not in acute distress  HEENT: No icterus, No thrush, No neck mass, [place]/AT  Cardiovascular: RRR, S1/S2, no rubs, no gallops  Respiratory: Scattered crackles, left greater than right. No wheezes. Good air movement.  Abdomen: Soft/+BS, non tender, non distended, no guarding  Extremities: No edema, No lymphangitis, No petechiae, No rashes, no synovitis  Data Reviewed: Basic Metabolic Panel:  Lab 06/04/12 9604 06/03/12 0425 06/01/12 2330 06/01/12 1640  NA 139 138 -- 141  K 3.8 3.9 -- 3.9  CL 103 103 -- 103  CO2 26 24 -- 26  GLUCOSE 159* 157* -- 103*  BUN 30* 28* -- 22  CREATININE 0.69 0.76 0.58 0.69  CALCIUM 8.9 9.0 -- 9.7  MG -- -- -- --  PHOS -- -- -- --   Liver Function Tests: No results found for this basename: AST:5,ALT:5,ALKPHOS:5,BILITOT:5,PROT:5,ALBUMIN:5 in the last 168 hours No results found for this basename: LIPASE:5,AMYLASE:5 in the last 168 hours No results found for this basename: AMMONIA:5 in the last 168 hours CBC:  Lab 06/04/12 0427 06/03/12 0425 06/01/12 2330 06/01/12 1640  WBC 18.2* 20.7* 24.8* 16.9*  NEUTROABS -- -- -- 14.2*  HGB 12.1 12.5 14.7 14.5  HCT 35.8* 35.4* 42.1 42.5  MCV 94.5 93.7 93.8 94.9  PLT 215 219 227 241   Cardiac Enzymes:  Lab 06/01/12 1640  CKTOTAL  --  CKMB --  CKMBINDEX --  TROPONINI <0.30   BNP: No components found with this basename: POCBNP:5 CBG: No results found for this basename: GLUCAP:5 in the last 168 hours  Recent Results (from the past 240 hour(s))  CULTURE, BLOOD (ROUTINE X 2)     Status: Normal (Preliminary result)   Collection Time   06/01/12  7:55 PM      Component Value Range Status Comment   Specimen Description BLOOD RTFA   Final    Special Requests NONE BOTTLES DRAWN AEROBIC AND ANAEROBIC 5CC   Final    Culture  Setup Time 06/02/2012 02:01   Final    Culture     Final    Value:        BLOOD CULTURE RECEIVED NO GROWTH TO DATE CULTURE WILL BE HELD FOR 5 DAYS BEFORE ISSUING A FINAL NEGATIVE REPORT   Report Status PENDING   Incomplete   CULTURE, BLOOD (ROUTINE X 2)     Status: Normal (Preliminary result)   Collection Time   06/01/12  8:00 PM      Component  Value Range Status Comment   Specimen Description BLOOD RIGHT HAND   Final    Special Requests NONE BOTTLES DRAWN AEROBIC ONLY 4CC   Final    Culture  Setup Time 06/02/2012 02:03   Final    Culture     Final    Value:        BLOOD CULTURE RECEIVED NO GROWTH TO DATE CULTURE WILL BE HELD FOR 5 DAYS BEFORE ISSUING A FINAL NEGATIVE REPORT   Report Status PENDING   Incomplete      Scheduled Meds:   . albuterol  2.5 mg Nebulization Q6H  . budesonide-formoterol  2 puff Inhalation BID  . calcium carbonate  2.5 tablet Oral Daily  . cholecalciferol  1,000 Units Oral Daily  . docusate sodium  100 mg Oral BID  . enoxaparin (LOVENOX) injection  40 mg Subcutaneous QHS  . levofloxacin  750 mg Oral Q48H  . lidocaine  1 patch Transdermal Q24H  . losartan  50 mg Oral QPM  . methylPREDNISolone (SOLU-MEDROL) injection  40 mg Intravenous Q6H  . multivitamin with minerals  1 tablet Oral Daily  . pravastatin  40 mg Oral QHS  . sodium chloride  3 mL Intravenous Q12H  . sodium chloride  3 mL Intravenous Q12H  . tiotropium  18 mcg Inhalation Daily  . traZODone  50 mg Oral  QHS   Continuous Infusions:    Channelle Bottger, DO  [facility] Pager [phone]  If 7PM-7AM, please contact night-coverage [email or web address] Password [facility] 06/04/2012, 5:34 PM   LOS: 3 days

## 2012-06-04 NOTE — Progress Notes (Signed)
Patient admitted for COPD exacerbation and shortness of breath. Patient is on 2L nasal cannula with clear diminished breath sounds. Patient states she believes breathing treatments are helpful. Chest xray shows pulmonary nodule.

## 2012-06-05 DIAGNOSIS — I447 Left bundle-branch block, unspecified: Secondary | ICD-10-CM

## 2012-06-05 MED ORDER — PREDNISONE 10 MG PO TABS: 50.0000 mg | ORAL_TABLET | Freq: Every day | ORAL | Status: DC

## 2012-06-05 MED ORDER — LEVOFLOXACIN 750 MG PO TABS: 750.0000 mg | ORAL_TABLET | ORAL | Status: DC

## 2012-06-05 NOTE — Discharge Summary (Signed)
Physician Discharge Summary  Amber Snow ZOX:[id number] DOB: 01-23-1933 DOA: 06/01/2012  PCP: Marga Melnick, MD  Admit date: 06/01/2012 Discharge date: 06/05/2012  Recommendations for Outpatient Follow-up:  1. Pt will need to follow up with PCP in 1 week post discharge 2. Please obtain BMP to evaluate electrolytes and kidney function 3. Please also check CBC to evaluate Hg and Hct levels 4. She will need repeat PET scan as outpatient to further clarify her pulmonary nodule 5. Patient was instructed to hold her losartan until she follows up with her PCP as her blood pressure was controlled without it during hospitalization  Discharge Diagnoses:  Active Problems:   HYPERLIPIDEMIA   LEFT BUNDLE BRANCH BLOCK   COPD   LOW BACK PAIN, CHRONIC   LV dysfunction   Lung nodule   COPD exacerbation   Nonischemic cardiomyopathy COPD exacerbation  -CT angiogram of the chest was negative for pulmonary embolus -Patient was started on Solu-Medrol, Levaquin, and bronchodilators -Wheezing has improved with Solu-Medrol  -changed to by mouth prednisone without decompensation -Continue every 6 hour albuterol-And when necessary for shortness of breath  -Continue levofloxacin--adjust Levaquin dose for renal function--final dose to be given on 06/06/2012 to complete 5 days -I believe that her continuing dyspnea on exertion is a combination of her COPD exacerbation, cardiomyopathy, and deconditioning.  -The patient was subsequently weaned off of oxygen supplementation. Her oxygen saturation was 90-94% with activity on room air. -She will go home with a prednisone wean starting with 50 mg on day #1 and decreasing by 10 mg daily Nonischemic cardiomyopathy  -no signs of decompensation -Ejection fraction 35-40%  -Continue losartan--as the patient's blood pressure was well maintained throughout the hospitalization off of losartan, she was instructed to hold her losartan until she follows up with her primary care  provider for recheck -Losartan was initially discontinued due to soft blood pressures Elevated proBNP  -Likely related to the patient's cardiomyopathy  -Clinically, patient is not fluid overloaded  Left bundle-branch block  -Appears to be chronic  -Heart catheterization on 09/12/2011 revealed minimal nonobstructive coronary disease  Hyperlipidemia  -Continue pravastatin  Lung nodule  -Will need outpatient followup/PET scan  Family Communication: Pt at beside  Disposition Plan: Home when medically stable  Antibiotics:  Levofloxacin 06/01/2012>>> 06/05/12   Discharge Condition: stable  Disposition: home  Diet:cardiac Wt Readings from Last 3 Encounters:  06/05/12 46.947 kg (103 lb 8 oz)  03/31/12 49.442 kg (109 lb)  12/17/11 44.997 kg (99 lb 3.2 oz)    History of present illness:  79 y.o. female with hx of moderate COPD, prior tobacco use, osteopenia, hyperlipidemia, chronic CHF, diastolic dysfx, CAD, presents to the ER with progressive SOB,wheezing and minimal coughs. She denied CP, fever,or chills. She doesn't use Oxygen at home. Evaluation in the ER included normal renal fx tests, leukocytosis with WBC of 17K, Hb 14.5 g/DL, and a CTA  Chest exluded a PE, but found an apical pulmonary nodule of 1cm and slight vascular congestion. She was given nebs, IV solumedrol, oxygen, IV Levaquin, and hospitalist was asked to admit her for COPD exacerbation. Her ABG 7.45/39/ Pa02=56/ 1L [place]    Discharge Exam: Filed Vitals:   06/05/12 0632  BP: 128/58  Pulse: 79  Temp: 98.1 F (36.7 C)  Resp: 20   Filed Vitals:   06/04/12 2044 06/05/12 0220 06/05/12 0632 06/05/12 0854  BP: 105/47  128/58   Pulse: 99  79   Temp: 98.1 F (36.7 C)  98.1 F (36.7 C)  TempSrc: Oral  Oral   Resp: 22  20   Height:      Weight:   46.947 kg (103 lb 8 oz)   SpO2:  94% 96% 95%   General: A&O x 3, NAD, pleasant, cooperative Cardiovascular: RRR, no rub, no gallop, no S3 Respiratory: CTAB, no wheeze, no  rhonchi Abdomen:soft, nontender, nondistended, positive bowel sounds Extremities: No edema, No lymphangitis, no petechiae  Discharge Instructions  Discharge Orders   Future Orders Complete By Expires     Diet - low sodium heart healthy  As directed     Discharge instructions  As directed     Comments:      Prednisone--take 50mg  (5 tablets) starting on 06/06/12 and decrease by one tablet daily until gone Levaquin--take one tablet daily--start Sunday, 06/06/12 Do NOT take Cozaar (losartan) until you follow up with your primary care doctor    Increase activity slowly  As directed         Medication List    STOP taking these medications       losartan 50 MG tablet  Commonly known as:  COZAAR      TAKE these medications       albuterol 108 (90 BASE) MCG/ACT inhaler  Commonly known as:  PROVENTIL HFA;VENTOLIN HFA  Inhale 2 puffs into the lungs every 6 (six) hours as needed. Shortness of breath     BINOSTO 70 MG Tbef  Generic drug:  Alendronate Sodium  Take 1 tablet by mouth once a week. On thursdays     BIOTIN PO  Take 1 capsule by mouth daily.     budesonide-formoterol 160-4.5 MCG/ACT inhaler  Commonly known as:  SYMBICORT  Inhale 2 puffs into the lungs 2 (two) times daily.     CALTRATE 600 1500 MG Tabs  Generic drug:  Calcium Carbonate  Take 2.5 tablets by mouth daily.     levofloxacin 750 MG tablet  Commonly known as:  LEVAQUIN  Take 1 tablet (750 mg total) by mouth every other day.     multivitamin with minerals Tabs  Take 1 tablet by mouth daily.     pravastatin 40 MG tablet  Commonly known as:  PRAVACHOL  Take 40 mg by mouth at bedtime.     predniSONE 10 MG tablet  Commonly known as:  DELTASONE  Take 5 tablets (50 mg total) by mouth daily with breakfast. Take 50mg  (5 tablets) on 06/06/12 and decrease by one tablet daily     tiotropium 18 MCG inhalation capsule  Commonly known as:  SPIRIVA  Place 18 mcg into inhaler and inhale daily.     traZODone 50 MG  tablet  Commonly known as:  DESYREL  Take 50 mg by mouth at bedtime.     vitamin C 1000 MG tablet  Take 1,000 mg by mouth daily.     Vitamin D 1000 UNITS capsule  Take 1,000 Units by mouth daily.     vitamin E 400 UNIT capsule  Take 400 Units by mouth daily.         The results of significant diagnostics from this hospitalization (including imaging, microbiology, ancillary and laboratory) are listed below for reference.    Significant Diagnostic Studies: Dg Chest 2 View  06/01/2012  *RADIOLOGY REPORT*  Clinical Data: Shortness of breath, weakness  CHEST - 2 VIEW  Comparison: Chest x-ray of 03/04/2012, 07/03/2009, and 11/12/2007  Findings: There is a spiculated nodular opacity within the right lung apex.  This could represent progressive right  apical scarring, but a neoplasm cannot be excluded.  CT of the chest with IV contrast media is recommended to assess further.  The lungs are hyperaerated consistent with COPD.  Mediastinal contours are stable.  The heart is mildly enlarged and stable.  No skeletal abnormality is noted.  IMPRESSION:  1.  Spiculated nodular opacity in the right apex medially. Possible scarring but cannot exclude neoplasm.  Recommend CT of the chest with IV contrast media. 2.  COPD.   Original Report Authenticated By: Dwyane Dee, M.D.    Ct Angio Chest W/cm &/or Wo Cm  06/01/2012  *RADIOLOGY REPORT*  Clinical Data: Shortness of breath.  Evaluate pulmonary embolus. Evaluate spiculated mass.  The patient presented to the ED with shortness of breath which began today.  No chest pain.  CT ANGIOGRAPHY CHEST  Technique:  Multidetector CT imaging of the chest using the standard protocol during bolus administration of intravenous contrast. Multiplanar reconstructed images including MIPs were obtained and reviewed to evaluate the vascular anatomy.  Contrast: 80mL OMNIPAQUE IOHEXOL 350 MG/ML SOLN  Comparison: CT of the chest on 04/26/2004, chest x-ray 06/01/2012  Findings: In the right  lung apex, there is a rounded, 1 cm nodule in the area of pleural parenchymal changes.  Although the findings may be related to scar, is difficult to entirely exclude a pathologic process.  At the left lung apex, benign pleural parenchymal changes are identified.  There is central lobular and the semen throughout the lungs bilaterally.  Within the dependent aspects of the lungs in the bases, there is infiltrate, most consistent with pulmonary edema or infectious infiltrate, right greater than left.  The pulmonary arteries are well opacified, showing no evidence for pulmonary embolus. No mediastinal, hilar, or axillary adenopathy. The visualized portion of the thyroid gland has a normal appearance.  Images of the upper abdomen are unremarkable.  Visualized osseous structures have a normal appearance.  IMPRESSION:  1.  Technically adequate exam showing no evidence for acute pulmonary embolus. 2.  Dependent changes in the lung bases bilaterally, favoring edema or infectious process. 3.  Right apical lung nodule is 1.0 cm in diameter. Malignancy cannot be excluded.  Consider cardiothoracic referral for further evaluation. 4.  Significant emphysema.   Original Report Authenticated By: Norva Pavlov, M.D.      Microbiology: Recent Results (from the past 240 hour(s))  CULTURE, BLOOD (ROUTINE X 2)     Status: None   Collection Time    06/01/12  7:55 PM      Result Value Range Status   Specimen Description BLOOD RTFA   Final   Special Requests NONE BOTTLES DRAWN AEROBIC AND ANAEROBIC 5CC   Final   Culture  Setup Time 06/02/2012 02:01   Final   Culture     Final   Value:        BLOOD CULTURE RECEIVED NO GROWTH TO DATE CULTURE WILL BE HELD FOR 5 DAYS BEFORE ISSUING A FINAL NEGATIVE REPORT   Report Status PENDING   Incomplete  CULTURE, BLOOD (ROUTINE X 2)     Status: None   Collection Time    06/01/12  8:00 PM      Result Value Range Status   Specimen Description BLOOD RIGHT HAND   Final   Special  Requests NONE BOTTLES DRAWN AEROBIC ONLY 4CC   Final   Culture  Setup Time 06/02/2012 02:03   Final   Culture     Final   Value:  BLOOD CULTURE RECEIVED NO GROWTH TO DATE CULTURE WILL BE HELD FOR 5 DAYS BEFORE ISSUING A FINAL NEGATIVE REPORT   Report Status PENDING   Incomplete     Labs: Basic Metabolic Panel:  Recent Labs Lab 06/01/12 1640 06/01/12 2330 06/03/12 0425 06/04/12 0427  NA 141  --  138 139  K 3.9  --  3.9 3.8  CL 103  --  103 103  CO2 26  --  24 26  GLUCOSE 103*  --  157* 159*  BUN 22  --  28* 30*  CREATININE 0.69 0.58 0.76 0.69  CALCIUM 9.7  --  9.0 8.9   Liver Function Tests: No results found for this basename: AST, ALT, ALKPHOS, BILITOT, PROT, ALBUMIN,  in the last 168 hours No results found for this basename: LIPASE, AMYLASE,  in the last 168 hours No results found for this basename: AMMONIA,  in the last 168 hours CBC:  Recent Labs Lab 06/01/12 1640 06/01/12 2330 06/03/12 0425 06/04/12 0427  WBC 16.9* 24.8* 20.7* 18.2*  NEUTROABS 14.2*  --   --   --   HGB 14.5 14.7 12.5 12.1  HCT 42.5 42.1 35.4* 35.8*  MCV 94.9 93.8 93.7 94.5  PLT 241 227 219 215   Cardiac Enzymes:  Recent Labs Lab 06/01/12 1640  TROPONINI <0.30   BNP: No components found with this basename: POCBNP,  CBG: No results found for this basename: GLUCAP,  in the last 168 hours  Time coordinating discharge:  Greater than 30 minutes  Signed:  Johnathan Heskett, DO [facility] Pager: [phone] 06/05/2012, 7:21 PM

## 2012-06-08 LAB — CULTURE, BLOOD (ROUTINE X 2): Culture: NO GROWTH

## 2012-06-11 ENCOUNTER — Ambulatory Visit: Payer: Medicare Other | Admitting: Internal Medicine

## 2012-06-14 ENCOUNTER — Encounter: Payer: Self-pay | Admitting: Internal Medicine

## 2012-06-14 ENCOUNTER — Ambulatory Visit (INDEPENDENT_AMBULATORY_CARE_PROVIDER_SITE_OTHER): Payer: Medicare Other | Admitting: Internal Medicine

## 2012-06-14 VITALS — BP 138/82 | HR 89 | Temp 98.1°F | Wt 105.0 lb

## 2012-06-14 DIAGNOSIS — R911 Solitary pulmonary nodule: Secondary | ICD-10-CM

## 2012-06-14 DIAGNOSIS — J438 Other emphysema: Secondary | ICD-10-CM

## 2012-06-14 DIAGNOSIS — R0989 Other specified symptoms and signs involving the circulatory and respiratory systems: Secondary | ICD-10-CM

## 2012-06-14 DIAGNOSIS — R946 Abnormal results of thyroid function studies: Secondary | ICD-10-CM

## 2012-06-14 DIAGNOSIS — R7989 Other specified abnormal findings of blood chemistry: Secondary | ICD-10-CM

## 2012-06-14 DIAGNOSIS — E785 Hyperlipidemia, unspecified: Secondary | ICD-10-CM

## 2012-06-14 DIAGNOSIS — D649 Anemia, unspecified: Secondary | ICD-10-CM

## 2012-06-14 LAB — BASIC METABOLIC PANEL
BUN: 19 mg/dL (ref 6–23)
Chloride: 102 mEq/L (ref 96–112)
Potassium: 4 mEq/L (ref 3.5–5.1)

## 2012-06-14 LAB — CBC WITH DIFFERENTIAL/PLATELET
Basophils Relative: 1 % (ref 0.0–3.0)
Eosinophils Absolute: 0.1 10*3/uL (ref 0.0–0.7)
Eosinophils Relative: 0.7 % (ref 0.0–5.0)
Lymphocytes Relative: 12.9 % (ref 12.0–46.0)
Neutrophils Relative %: 80.1 % — ABNORMAL HIGH (ref 43.0–77.0)
RBC: 4.3 Mil/uL (ref 3.87–5.11)
WBC: 11.3 10*3/uL — ABNORMAL HIGH (ref 4.5–10.5)

## 2012-06-14 LAB — LDL CHOLESTEROL, DIRECT: Direct LDL: 126.5 mg/dL

## 2012-06-14 LAB — T4, FREE: Free T4: 1.57 ng/dL (ref 0.60–1.60)

## 2012-06-14 LAB — LIPID PANEL: Triglycerides: 215 mg/dL — ABNORMAL HIGH (ref 0.0–149.0)

## 2012-06-14 LAB — VITAMIN B12: Vitamin B-12: 452 pg/mL (ref 211–911)

## 2012-06-14 LAB — IBC PANEL: Iron: 50 ug/dL (ref 42–145)

## 2012-06-14 LAB — T3, FREE: T3, Free: 2.6 pg/mL (ref 2.3–4.2)

## 2012-06-14 LAB — AST: AST: 28 U/L (ref 0–37)

## 2012-06-14 LAB — TSH: TSH: 0.79 u[IU]/mL (ref 0.35–5.50)

## 2012-06-14 NOTE — Progress Notes (Signed)
Subjective:    Patient ID: Amber Snow, female    DOB: 1932/06/15, 79 y.o.   MRN: [id number]  HPI  She was hospitalized 2/4 to 06/05/12 with dyspnea w/o chest pain . Hospital records, labs, & x-ray reports reviewed and discussed.   Pertinent positives included 1 cm nodule right apical area. Edematous changes were noted in both lung bases. Followup PET scan recommended.  Random glucose was 159, BUN 30, GFR 82, hematocrit 35.8 and white count as high as 24,800 initially. At the time of discharge it was 18,200. A1c was normal at 5.8%. TSH was low normal at 0.363.    Discharged on antibiotic Levaquin & oral steroids. Her antihypertensive Losartan was held.  Since discharge course  improved "some" with DOE with minimal exertion.  She D/ced smoking 09/2012.    Review of Systems Constitutional: No fever, chills, significant weight change. Some fatigue & intermittent flushing w/o sweats ENT/mouth: No nasal congestion,  purulent discharge,sore throat , or hoarseness   Cardiovascular: no chest pain, palpitations, racing, irregular rhythm, nausea, claudication, or edema  Respiratory: No significant cough, sputum production,hemoptysis,   paroxysmal nocturnal dyspnea, pleuritic chest pain Gastrointestinal: No  abdominal pain,  rectal bleeding,or melena Genitourinary: No dysuria,hematuria, pyuria. Bladder instillation treatment pending. Dermatologic: No rash Endocrine: No change in hair/skin/ nails, excessive thirst, excessive hunger, excessive urination Hematologic/lymphatic: No bruising, lymphadenopathy,or  abnormal clotting        Objective:   Physical Exam Gen.: Thin but healthy and adequately nourished in appearance. Alert, appropriate and cooperative throughout exam. Appears younger than stated age    Eyes: No corneal or conjunctival inflammation noted. EOMI; no lid lag. Extraocular motion intact.  Ears: External  ear exam reveals no significant lesions or deformities.  Hearing aids  bilaterally. Nose: External nasal exam reveals no deformity or inflammation. Nasal mucosa are pink and moist. No lesions or exudates noted.  Mouth: Oral mucosa and oropharynx reveal no lesions or exudates. Teeth in good repair. Neck: No deformities, masses, or tenderness noted.  Thyroid small. Lungs: Normal respiratory effort; chest expands symmetrically. Lungs are clear to auscultation without rales, wheezes, or increased work of breathing.Markedly decreased BS Heart: Heart sounds distant.Normal rate and rhythm. Normal S1 and S2. No gallop, click, or rub. Grade 1/6 systolic murmur in epigastrium. Abdomen: Bowel sounds normal; abdomen soft and nontender. No masses, organomegaly or hernias noted.                                  Musculoskeletal/extremities:  Accentuated curvature of lower thoracic  Spine. There is some asymmetry of the posterior thoracic musculature suggesting occult scoliosis. No clubbing, cyanosis, edema, or significant extremity  deformity noted. Tone & strength  Normal. Joints normal. Nail health good. Able to lie down & sit up w/o help.  Vascular: Carotid, radial artery are full and equal. No bruits present.Decreased dorsalis pedis and  posterior tibial pulses Neurologic: Alert and oriented x3. Deep tendon reflexes symmetrical and normal.       Skin: Intact without suspicious lesions or rashes. Lymph: No cervical, axillary lymphadenopathy present. Psych: Mood and affect are normal. Normally interactive  Assessment & Plan:  #1 dyspnea in the context of COPD  #2 pulmonary nodule right apex  #3 mild azotemia and decreased GFR.  #4 anemia  Plan: See orders and recommendations

## 2012-06-14 NOTE — Patient Instructions (Addendum)
Minimal Blood Pressure Goal= AVERAGE < 140/90;  Ideal is an AVERAGE < 135/85. This AVERAGE should be calculated from @ least 5-7 BP readings taken @ different times of day on different days of week. You should not respond to isolated BP readings , but rather the AVERAGE for that week .Please bring your  blood pressure cuff to office visits to verify that it is reliable.It  can also be checked against the blood pressure device at the pharmacy. Finger or wrist cuffs are not dependable; an arm cuff is. Review and correct the record as indicated. Please share record with all medical staff seen.

## 2012-06-17 ENCOUNTER — Ambulatory Visit (INDEPENDENT_AMBULATORY_CARE_PROVIDER_SITE_OTHER): Payer: Medicare Other | Admitting: Internal Medicine

## 2012-06-17 ENCOUNTER — Telehealth: Payer: Self-pay | Admitting: *Deleted

## 2012-06-17 ENCOUNTER — Encounter: Payer: Self-pay | Admitting: Internal Medicine

## 2012-06-17 VITALS — BP 140/78 | HR 113 | Temp 98.4°F | Ht 63.5 in | Wt 107.2 lb

## 2012-06-17 DIAGNOSIS — J449 Chronic obstructive pulmonary disease, unspecified: Secondary | ICD-10-CM

## 2012-06-17 DIAGNOSIS — R911 Solitary pulmonary nodule: Secondary | ICD-10-CM

## 2012-06-17 NOTE — Telephone Encounter (Signed)
Pt calling to get result of labs, called Pt back and advise labs have not been reviewed by hopp will have someone return call when labs have been resulted.Please advise

## 2012-06-17 NOTE — Progress Notes (Signed)
Subjective:    Patient ID: Amber Snow, female    DOB: Sep 13, 1932  MRN: [id number]  HPI  19 yowf with GOLD II copd by pfts 07/2009 quit smoking June 2013  due to progressive doe x 5 years worse since quit referred to pulmonary clinic 06/17/2012 by Dr Lona Kettle p admit :  Admit date: 06/01/2012  Discharge date: 06/05/2012  Discharge Diagnoses:  Active Problems:  HYPERLIPIDEMIA  LEFT BUNDLE BRANCH BLOCK  COPD  LOW BACK PAIN, CHRONIC  LV dysfunction  Lung nodule > outpt pet rec COPD exacerbation  Nonischemic cardiomyopathy   COPD exacerbation  -CT angiogram of the chest was negative for pulmonary embolus  -Patient was started on Solu-Medrol, Levaquin, and bronchodilators  -Wheezing has improved with Solu-Medrol  -changed to by mouth prednisone without decompensation  -Continue every 6 hour albuterol-And when necessary for shortness of breath  -Continue levofloxacin--adjust Levaquin dose for renal function--final dose to be given on 06/06/2012 to complete 5 days  -I believe that her continuing dyspnea on exertion is a combination of her COPD exacerbation, cardiomyopathy, and deconditioning.  -The patient was subsequently weaned off of oxygen supplementation. Her oxygen saturation was 90-94% with activity on room air.  -She will go home with a prednisone wean starting with 50 mg on day #1 and decreasing by 10 mg daily    06/17/2012 1st pulmonary eval/ Sohail Capraro s/p hosp in January 2014 cc doe x 50 ft variable improvement with saba =proaire  No obvious daytime variabilty or assoc chronic cough or cp or chest tightness, subjective wheeze overt sinus or hb symptoms. No unusual exp hx or h/o childhood pna/ asthma or premature birth to his knowledge.   Sleeping ok without nocturnal  or early am exacerbation  of respiratory  c/o's or need for noct saba. Also denies any obvious fluctuation of symptoms with weather or environmental changes or other aggravating or alleviating factors except as  outlined above    Review of Systems  Constitutional: Negative for fever and unexpected weight change.  HENT: Negative for ear pain, nosebleeds, congestion, sore throat, rhinorrhea, sneezing, trouble swallowing, dental problem, postnasal drip and sinus pressure.   Eyes: Negative for redness and itching.  Respiratory: Positive for chest tightness, shortness of breath and wheezing. Negative for cough.   Cardiovascular: Negative for palpitations and leg swelling.  Gastrointestinal: Negative for nausea and vomiting.  Genitourinary: Negative for dysuria.  Musculoskeletal: Negative for joint swelling.  Skin: Negative for rash.  Neurological: Positive for light-headedness. Negative for headaches.  Hematological: Does not bruise/bleed easily.  Psychiatric/Behavioral: Negative for dysphoric mood. The patient is not nervous/anxious.        Objective:   Physical Exam   Wt Readings from Last 3 Encounters:  06/17/12 107 lb 3.2 oz (48.626 kg)  06/14/12 105 lb (47.628 kg)  06/05/12 103 lb 8 oz (46.947 kg)     HEENT mild turbinate edema.  Oropharynx no thrush or excess pnd or cobblestoning.  No JVD or cervical adenopathy. Mild accessory muscle hypertrophy. Trachea midline, nl thryroid. Chest was hyperinflated by percussion with diminished breath sounds and moderate increased exp time without wheeze. Hoover sign positive at mid inspiration. Regular rate and rhythm without murmur gallop or rub or increase P2 or edema.  Abd: no hsm, nl excursion. Ext warm without cyanosis or clubbing.    cxr 06/01/12  1. Spiculated nodular opacity in the right apex medially.  (not viz on cxr  03/04/12) Possible scarring but cannot exclude neoplasm.  Assessment & Plan:

## 2012-06-17 NOTE — Patient Instructions (Addendum)
Symbicort 2 every 12 hours Spiriva once daily  Only use your albuterol (proaire) as a rescue medication to be used if you can't catch your breath by resting or doing a relaxed purse lip breathing pattern. The less you use it, the better it will work when you need it.   Work on inhaler technique:  relax and gently blow all the way out then take a nice smooth deep breath back in, triggering the inhaler at same time you start breathing in.  Hold for up to 5 seconds if you can.  Rinse and gargle with water when done   If your mouth or throat starts to bother you,   I suggest you time the inhaler to your dental care and after using the inhaler(s) brush teeth and tongue with a baking soda containing toothpaste and when you rinse this out, gargle with it first to see if this helps your mouth and throat.     Please schedule a follow up office visit in 6 weeks, call sooner if needed with cxr and pft's

## 2012-06-18 ENCOUNTER — Telehealth: Payer: Self-pay

## 2012-06-18 NOTE — Telephone Encounter (Signed)
Message copied by Maurice Small on Fri Jun 18, 2012  8:17 AM ------      Message from: Pecola Lawless      Created: Thu Jun 17, 2012  6:31 PM       White blood count is minimally elevated; it is dramatically improved compared to 06/04/12. No abnormal cells or anemia present. Chemistries are normal. Iron level, B12, liver function test, and full thyroid panels are normal. Triglycerides have risen from 134to 215 suggesting suboptimal nutrition.      The most common cause of elevated triglycerides (TG) is the ingestion of sugar from high fructose corn syrup sources added to processed foods & drinks.  Eat a low-fat diet with lots of fruits and vegetables, up to 7-9 servings per day. Consume less than  30  Grams (preferably ZERO) of sugar per day from foods & drinks with High Fructose Corn Syrup (HFCS) sugar as #1,2,3 or # 4 on label.[employer], [employer] & [employer] do not carry products with HFCS.       ------

## 2012-06-18 NOTE — Telephone Encounter (Signed)
Labs mailed to patient today, I tried to call patient x 2 and recording stated my call did not go through and I need to try my call again.

## 2012-06-18 NOTE — Telephone Encounter (Signed)
Comments were mailed with a copy of labs, per MD labs were signed before comments attached

## 2012-06-19 NOTE — Assessment & Plan Note (Addendum)
-   PFT's 07/31/09 FEV1  1.36 (74%) ratio 45 and no better p B2,  DLCO 66%   GOLD II by previous study but continued to smoke for 2 more years - the peculiar issue is why she feels worse since she stopped  I reviewed the Flethcher curve with patient that basically indicates  if you quit smoking when your best day FEV1 is still well preserved (which hers should be)  it is highly unlikely you will progress to severe disease and informed the patient there was no medication on the market that has proven to change the curve or the likelihood of progression.  Therefore stopping smoking and maintaining abstinence is the most important aspect of care, not choice of inhalers or for that matter, doctors.    For now work on optimal inhaler technique then return for pfts  The proper method of use, as well as anticipated side effects, of a metered-dose inhaler are discussed and demonstrated to the patient. Improved effectiveness after extensive coaching during this visit to a level of approximately  75%

## 2012-06-19 NOTE — Assessment & Plan Note (Signed)
-   Not seen on cxr  03/08/2012 and obvious on cxr 06/01/12  Strongly doubt this is a tumor based on how much it "grew" in less than 3 months but even it were a rapidly growing neoplasm we could not offer resection for cure in a pt sob x 50 ft walking so hopefully it will prove to be inflammatory.  Discussed in detail all the  indications, usual  risks and alternatives  relative to the benefits with patient who agrees to proceed with conservative f/u with cxr in 6 weeks (placed in tickle file)

## 2012-06-22 ENCOUNTER — Telehealth: Payer: Self-pay | Admitting: *Deleted

## 2012-06-22 NOTE — Telephone Encounter (Signed)
Pt would like to know if she need to restart the losartan. Please advise

## 2012-06-22 NOTE — Telephone Encounter (Signed)
Minimal Blood Pressure Goal= AVERAGE < 140/90;  Ideal is an AVERAGE < 135/85. This AVERAGE should be calculated from @ least 5-7 BP readings taken @ different times of day on different days of week. You should not respond to isolated BP readings , but rather the AVERAGE for that week .Please bring your  blood pressure cuff to office visits to verify that it is reliable.It  can also be checked against the blood pressure device at the pharmacy. Finger or wrist cuffs are not dependable; an arm cuff is. If BP AVERAGES> 140/90 ,restart Losartan

## 2012-06-22 NOTE — Telephone Encounter (Signed)
Discuss with patient  

## 2012-06-25 ENCOUNTER — Other Ambulatory Visit: Payer: Self-pay | Admitting: Obstetrics

## 2012-07-01 ENCOUNTER — Telehealth: Payer: Self-pay | Admitting: Internal Medicine

## 2012-07-01 MED ORDER — PRAVASTATIN SODIUM 40 MG PO TABS: 40.0000 mg | ORAL_TABLET | Freq: Every day | ORAL | Status: DC

## 2012-07-01 NOTE — Telephone Encounter (Signed)
90 day supply request from [facility] [address] for pravastatin sodium 40 mg tab.

## 2012-07-05 ENCOUNTER — Other Ambulatory Visit (HOSPITAL_COMMUNITY): Payer: Self-pay | Admitting: Obstetrics

## 2012-07-08 ENCOUNTER — Inpatient Hospital Stay (HOSPITAL_COMMUNITY): Admission: RE | Admit: 2012-07-08 | Payer: Medicare Other | Source: Ambulatory Visit

## 2012-07-22 ENCOUNTER — Ambulatory Visit (HOSPITAL_COMMUNITY)
Admission: RE | Admit: 2012-07-22 | Discharge: 2012-07-22 | Disposition: A | Discharge status: Home / Self Care | Payer: Medicare Other | Source: Ambulatory Visit | Attending: Obstetrics | Admitting: Obstetrics

## 2012-07-22 ENCOUNTER — Encounter (HOSPITAL_COMMUNITY): Payer: Self-pay

## 2012-07-22 DIAGNOSIS — Z79899 Other long term (current) drug therapy: Secondary | ICD-10-CM | POA: Insufficient documentation

## 2012-07-22 DIAGNOSIS — K224 Dyskinesia of esophagus: Secondary | ICD-10-CM | POA: Insufficient documentation

## 2012-07-22 DIAGNOSIS — M899 Disorder of bone, unspecified: Secondary | ICD-10-CM | POA: Insufficient documentation

## 2012-07-22 MED ORDER — ZOLEDRONIC ACID 5 MG/100ML IV SOLN
5.0000 mg | Freq: Once | INTRAVENOUS | Status: AC
  Administered 2012-07-22: 5 mg via INTRAVENOUS
  Filled 2012-07-22: qty 100

## 2012-07-22 MED ORDER — SODIUM CHLORIDE 0.9 % IV SOLN
INTRAVENOUS | Status: DC
  Administered 2012-07-22: 13:00:00 via INTRAVENOUS

## 2012-07-29 ENCOUNTER — Encounter: Payer: Self-pay | Admitting: Internal Medicine

## 2012-07-29 ENCOUNTER — Ambulatory Visit (INDEPENDENT_AMBULATORY_CARE_PROVIDER_SITE_OTHER)
Admission: RE | Admit: 2012-07-29 | Discharge: 2012-07-29 | Disposition: A | Discharge status: Home / Self Care | Payer: Medicare Other | Source: Ambulatory Visit | Attending: Internal Medicine | Admitting: Internal Medicine

## 2012-07-29 ENCOUNTER — Ambulatory Visit (INDEPENDENT_AMBULATORY_CARE_PROVIDER_SITE_OTHER): Payer: Medicare Other | Admitting: Internal Medicine

## 2012-07-29 VITALS — BP 132/66 | HR 65 | Temp 97.9°F | Ht 63.0 in | Wt 105.0 lb

## 2012-07-29 DIAGNOSIS — J449 Chronic obstructive pulmonary disease, unspecified: Secondary | ICD-10-CM

## 2012-07-29 DIAGNOSIS — R0989 Other specified symptoms and signs involving the circulatory and respiratory systems: Secondary | ICD-10-CM

## 2012-07-29 DIAGNOSIS — J441 Chronic obstructive pulmonary disease with (acute) exacerbation: Secondary | ICD-10-CM

## 2012-07-29 DIAGNOSIS — R0600 Dyspnea, unspecified: Secondary | ICD-10-CM

## 2012-07-29 DIAGNOSIS — R911 Solitary pulmonary nodule: Secondary | ICD-10-CM

## 2012-07-29 DIAGNOSIS — J4489 Other specified chronic obstructive pulmonary disease: Secondary | ICD-10-CM

## 2012-07-29 LAB — PULMONARY FUNCTION TEST

## 2012-07-29 NOTE — Progress Notes (Signed)
Subjective:    Patient ID: Amber Snow, female    DOB: October 22, 1932  MRN: [id number]  HPI  56 yowf with GOLD II copd by pfts 07/2009 quit smoking June 2013  due to progressive doe x 5 years worse since quit referred to pulmonary clinic 06/17/2012 by Dr Lona Kettle p admit :  Admit date: 06/01/2012  Discharge date: 06/05/2012  Discharge Diagnoses:  Active Problems:  HYPERLIPIDEMIA  LEFT BUNDLE BRANCH BLOCK  COPD  LOW BACK PAIN, CHRONIC  LV dysfunction  Lung nodule > outpt pet rec COPD exacerbation  Nonischemic cardiomyopathy   COPD exacerbation  -CT angiogram of the chest was negative for pulmonary embolus  -Patient was started on Solu-Medrol, Levaquin, and bronchodilators  -Wheezing has improved with Solu-Medrol  -changed to by mouth prednisone without decompensation  -Continue every 6 hour albuterol-And when necessary for shortness of breath  -Continue levofloxacin--adjust Levaquin dose for renal function--final dose to be given on 06/06/2012 to complete 5 days  -I believe that her continuing dyspnea on exertion is a combination of her COPD exacerbation, cardiomyopathy, and deconditioning.  -The patient was subsequently weaned off of oxygen supplementation. Her oxygen saturation was 90-94% with activity on room air.  -She will go home with a prednisone wean starting with 50 mg on day #1 and decreasing by 10 mg daily    06/17/2012 1st pulmonary eval/ [facility] Ignasiak s/p hosp in January 2014 cc doe x 50 ft variable improvement with saba =proaire rec Symbicort 2 every 12 hours Spiriva once daily  Only use your albuterol (proaire) as a rescue medication Work on inhaler technique:   07/29/2012  ov/[place]lins Stuard off cigs for a year f/u for copd/ R apical nodule Chief Complaint  Patient presents with  . Follow-up    Pt states breathing is the same, but overall feels better since last visit. Has occ prod cough with beige to clear sputum.    cc improved with less need for proaire and walk at [employer]  still has trouble more than one room vacuum   No obvious daytime variabilty or assoc chronic cough or cp or chest tightness, subjective wheeze overt sinus or hb symptoms. No unusual exp hx or h/o childhood pna/ asthma or premature birth to his knowledge.   Sleeping ok without nocturnal  or early am exacerbation  of respiratory  c/o's or need for noct saba. Also denies any obvious fluctuation of symptoms with weather or environmental changes or other aggravating or alleviating factors except as outlined above   ROS  The following are not active complaints unless bolded sore throat, dysphagia, dental problems, itching, sneezing,  nasal congestion or excess/ purulent secretions, ear ache,   fever, chills, sweats, unintended wt loss, pleuritic or exertional cp, hemoptysis,  orthopnea pnd or leg swelling, presyncope, palpitations, heartburn, abdominal pain, anorexia, nausea, vomiting, diarrhea  or change in bowel or urinary habits, change in stools or urine, dysuria,hematuria,  rash, arthralgias, visual complaints, headache, numbness weakness or ataxia or problems with walking or coordination,  change in mood/affect or memory.           Objective:   Physical Exam  07/29/2012  105  Wt Readings from Last 3 Encounters:  06/17/12 107 lb 3.2 oz (48.626 kg)  06/14/12 105 lb (47.628 kg)  06/05/12 103 lb 8 oz (46.947 kg)     HEENT mild turbinate edema.  Oropharynx no thrush or excess pnd or cobblestoning.  No JVD or cervical adenopathy. Mild accessory muscle hypertrophy. Trachea midline, nl thryroid. Chest  was hyperinflated by percussion with diminished breath sounds and moderate increased exp time without wheeze. Hoover sign positive at mid inspiration. Regular rate and rhythm without murmur gallop or rub or increase P2 or edema.  Abd: no hsm, nl excursion. Ext warm without cyanosis or clubbing.    CXR  07/29/2012 :  Underlying emphysema. Stable spiculated area right apex. This  area warrants close  clinical imaging and surveillance given this  Appearance.        Assessment & Plan:

## 2012-07-29 NOTE — Assessment & Plan Note (Addendum)
-   PFT's 07/31/09 FEV1  1.36 (74%) ratio 45 and no better p B2,  DLCO 66%  - quit smoking June 2013 - PFT's 07/29/2012 FEV1 1.10 (63%) ratio 46 and no change p B2 and DLCO 57%  - HFA  75% 07/29/2012     Each maintenance medication was reviewed in detail including most importantly the difference between maintenance and as needed and under what circumstances the prns are to be used.  Please see instructions for details which were reviewed in writing and the patient given a copy.    The proper method of use, as well as anticipated side effects, of a metered-dose inhaler are discussed and demonstrated to the patient. Improved effectiveness after extensive coaching during this visit to a level of approximately  75% so continue hfa symbicort

## 2012-07-29 NOTE — Patient Instructions (Addendum)
No change in medications  Please schedule a follow up visit in 6 months but call sooner if needed  Late add After chart review rec limited CT in 3 months apples to apples comparison

## 2012-07-29 NOTE — Progress Notes (Signed)
PFT done today. 

## 2012-08-01 ENCOUNTER — Telehealth: Payer: Self-pay | Admitting: Internal Medicine

## 2012-08-01 NOTE — Assessment & Plan Note (Addendum)
-   Not seen on cxr  03/08/2012 and obvious on cxr 06/01/12 -06/01/12 CT chest . Technically adequate exam showing no evidence for acute  pulmonary embolus.  2. Dependent changes in the lung bases bilaterally, favoring edema  or infectious process.  3. Right apical lung nodule is 1.0 cm in diameter.  This easily can be easily seen on cxr now and was not present 3 months before so hope is it's inflammatory or post -inflammatory scar and hasn't grown since, is at the limit of PET Scanning in a pt who is a poor operative candidate.  Discussed in detail all the  indications, usual  risks and alternatives  relative to the benefits with patient who agrees to proceed with conservative f/u and proceed to PET/ excisional bx if shows growth at 6 months from discovery > tickle file

## 2012-08-03 ENCOUNTER — Other Ambulatory Visit: Payer: Self-pay | Admitting: Urology

## 2012-08-11 ENCOUNTER — Encounter: Payer: Self-pay | Admitting: Internal Medicine

## 2012-08-16 ENCOUNTER — Other Ambulatory Visit: Payer: Self-pay | Admitting: Internal Medicine

## 2012-08-17 ENCOUNTER — Other Ambulatory Visit: Payer: Self-pay | Admitting: Internal Medicine

## 2012-08-18 ENCOUNTER — Other Ambulatory Visit: Payer: Self-pay | Admitting: Internal Medicine

## 2012-08-18 ENCOUNTER — Encounter (HOSPITAL_BASED_OUTPATIENT_CLINIC_OR_DEPARTMENT_OTHER): Payer: Self-pay | Admitting: *Deleted

## 2012-08-18 NOTE — Progress Notes (Signed)
Pt instructed npo p mn 2/24 x spiriva, symbicort inhalers and bring albuterol inhaler w her.  To [facility] 2/25 @ 0745.  Needs istat on arrival.  ekg cardiac studies in epic. Anesthesia to review chart.

## 2012-08-19 NOTE — Progress Notes (Signed)
Chart reviewed by Dr Rose-OK to proceed at [facility].

## 2012-08-20 ENCOUNTER — Ambulatory Visit (HOSPITAL_BASED_OUTPATIENT_CLINIC_OR_DEPARTMENT_OTHER)
Admission: RE | Admit: 2012-08-20 | Discharge: 2012-08-20 | Disposition: A | Discharge status: Home / Self Care | Payer: Medicare Other | Source: Ambulatory Visit | Attending: Urology | Admitting: Urology

## 2012-08-20 ENCOUNTER — Ambulatory Visit (HOSPITAL_BASED_OUTPATIENT_CLINIC_OR_DEPARTMENT_OTHER): Payer: Medicare Other | Admitting: Anesthesiology

## 2012-08-20 ENCOUNTER — Encounter (HOSPITAL_BASED_OUTPATIENT_CLINIC_OR_DEPARTMENT_OTHER): Payer: Self-pay | Admitting: Anesthesiology

## 2012-08-20 ENCOUNTER — Encounter (HOSPITAL_BASED_OUTPATIENT_CLINIC_OR_DEPARTMENT_OTHER)
Admission: RE | Disposition: A | Discharge status: Home / Self Care | Payer: Self-pay | Source: Ambulatory Visit | Attending: Urology

## 2012-08-20 DIAGNOSIS — F17200 Nicotine dependence, unspecified, uncomplicated: Secondary | ICD-10-CM | POA: Insufficient documentation

## 2012-08-20 DIAGNOSIS — Z888 Allergy status to other drugs, medicaments and biological substances status: Secondary | ICD-10-CM | POA: Insufficient documentation

## 2012-08-20 DIAGNOSIS — Z8551 Personal history of malignant neoplasm of bladder: Secondary | ICD-10-CM | POA: Insufficient documentation

## 2012-08-20 DIAGNOSIS — C672 Malignant neoplasm of lateral wall of bladder: Secondary | ICD-10-CM | POA: Insufficient documentation

## 2012-08-20 DIAGNOSIS — Z880 Allergy status to penicillin: Secondary | ICD-10-CM | POA: Insufficient documentation

## 2012-08-20 DIAGNOSIS — Z87442 Personal history of urinary calculi: Secondary | ICD-10-CM | POA: Insufficient documentation

## 2012-08-20 HISTORY — PX: CYSTOSCOPY: SHX5120

## 2012-08-20 LAB — POCT I-STAT 4, (NA,K, GLUC, HGB,HCT)
Glucose, Bld: 98 mg/dL (ref 70–99)
HCT: 42 % (ref 36.0–46.0)

## 2012-08-20 SURGERY — CYSTOSCOPY
Anesthesia: General | Site: Bladder | Wound class: Clean Contaminated

## 2012-08-20 MED ORDER — PROPOFOL 10 MG/ML IV BOLUS
INTRAVENOUS | Status: DC | PRN
  Administered 2012-08-20: 150 mg via INTRAVENOUS

## 2012-08-20 MED ORDER — ONDANSETRON HCL 4 MG/2ML IJ SOLN
INTRAMUSCULAR | Status: DC | PRN
  Administered 2012-08-20: 4 mg via INTRAVENOUS

## 2012-08-20 MED ORDER — LACTATED RINGERS IV SOLN
INTRAVENOUS | Status: DC
  Administered 2012-08-20: 08:00:00 via INTRAVENOUS
  Filled 2012-08-20: qty 1000

## 2012-08-20 MED ORDER — PROMETHAZINE HCL 25 MG/ML IJ SOLN
6.2500 mg | INTRAMUSCULAR | Status: DC | PRN
  Filled 2012-08-20: qty 1

## 2012-08-20 MED ORDER — CIPROFLOXACIN IN D5W 400 MG/200ML IV SOLN
400.0000 mg | INTRAVENOUS | Status: AC
  Administered 2012-08-20: 400 mg via INTRAVENOUS
  Filled 2012-08-20: qty 200

## 2012-08-20 MED ORDER — FENTANYL CITRATE 0.05 MG/ML IJ SOLN
25.0000 ug | INTRAMUSCULAR | Status: DC | PRN
  Filled 2012-08-20: qty 1

## 2012-08-20 MED ORDER — DEXAMETHASONE SODIUM PHOSPHATE 10 MG/ML IJ SOLN
INTRAMUSCULAR | Status: DC | PRN
  Administered 2012-08-20: 10 mg via INTRAVENOUS

## 2012-08-20 MED ORDER — FENTANYL CITRATE 0.05 MG/ML IJ SOLN
INTRAMUSCULAR | Status: DC | PRN
  Administered 2012-08-20: 25 ug via INTRAVENOUS
  Administered 2012-08-20 (×4): 12.5 ug via INTRAVENOUS
  Administered 2012-08-20: 25 ug via INTRAVENOUS

## 2012-08-20 MED ORDER — LIDOCAINE HCL (CARDIAC) 10 MG/ML IV SOLN
INTRAVENOUS | Status: DC | PRN
  Administered 2012-08-20: 75 mg via INTRAVENOUS

## 2012-08-20 MED ORDER — SODIUM CHLORIDE 0.9 % IR SOLN
Status: DC | PRN
  Administered 2012-08-20: 3000 mL

## 2012-08-20 MED ORDER — LACTATED RINGERS IV SOLN
INTRAVENOUS | Status: DC | PRN
  Administered 2012-08-20: 08:00:00 via INTRAVENOUS

## 2012-08-20 MED ORDER — PROPOFOL INFUSION 10 MG/ML OPTIME: INTRAVENOUS | Status: DC | PRN

## 2012-08-20 MED ORDER — STERILE WATER FOR IRRIGATION IR SOLN
Status: DC | PRN
  Administered 2012-08-20: 3000 mL

## 2012-08-20 SURGICAL SUPPLY — 35 items
BAG DRAIN URO-CYSTO SKYTR STRL (DRAIN) ×3 IMPLANT
BAG DRN ANRFLXCHMBR STRAP LEK (BAG)
BAG DRN UROCATH (DRAIN) ×2
BAG URINE DRAINAGE (UROLOGICAL SUPPLIES) IMPLANT
BAG URINE LEG 19OZ MD ST LTX (BAG) IMPLANT
CANISTER SUCT LVC 12 LTR MEDI- (MISCELLANEOUS) ×4 IMPLANT
CATH FOLEY 2WAY SLVR  5CC 20FR (CATHETERS)
CATH FOLEY 2WAY SLVR  5CC 22FR (CATHETERS)
CATH FOLEY 2WAY SLVR 5CC 20FR (CATHETERS) IMPLANT
CATH FOLEY 2WAY SLVR 5CC 22FR (CATHETERS) IMPLANT
CLOTH BEACON ORANGE TIMEOUT ST (SAFETY) ×3 IMPLANT
DRAPE CAMERA CLOSED 9X96 (DRAPES) ×2 IMPLANT
ELECT LOOP HF 26F 30D .35MM (CUTTING LOOP) IMPLANT
ELECT LOOP MED HF 24F 12D CBL (CLIP) ×2 IMPLANT
ELECT REM PT RETURN 9FT ADLT (ELECTROSURGICAL) ×3
ELECTRODE REM PT RTRN 9FT ADLT (ELECTROSURGICAL) ×2 IMPLANT
EVACUATOR MICROVAS BLADDER (UROLOGICAL SUPPLIES) ×1 IMPLANT
GLOVE BIO SURGEON STRL SZ7 (GLOVE) ×3 IMPLANT
GLOVE ECLIPSE 6.5 STRL STRAW (GLOVE) ×2 IMPLANT
GLOVE INDICATOR 6.5 STRL GRN (GLOVE) ×2 IMPLANT
GOWN STRL NON-REIN LRG LVL3 (GOWN DISPOSABLE) ×4 IMPLANT
HOLDER FOLEY CATH W/STRAP (MISCELLANEOUS) IMPLANT
IV NS IRRIG 3000ML ARTHROMATIC (IV SOLUTION) ×4 IMPLANT
KIT ASPIRATION TUBING (SET/KITS/TRAYS/PACK) ×2 IMPLANT
LOOP CUTTING 24FR OLYMPUS (CUTTING LOOP) IMPLANT
NDL SAFETY ECLIPSE 18X1.5 (NEEDLE) IMPLANT
NEEDLE HYPO 18GX1.5 SHARP (NEEDLE)
NEEDLE HYPO 22GX1.5 SAFETY (NEEDLE) IMPLANT
NS IRRIG 500ML POUR BTL (IV SOLUTION) IMPLANT
PACK CYSTOSCOPY (CUSTOM PROCEDURE TRAY) ×3 IMPLANT
PLUG CATH AND CAP STER (CATHETERS) IMPLANT
SET ASPIRATION TUBING (TUBING) IMPLANT
SYR 20CC LL (SYRINGE) IMPLANT
SYRINGE IRR TOOMEY STRL 70CC (SYRINGE) IMPLANT
WATER STERILE IRR 3000ML UROMA (IV SOLUTION) ×3 IMPLANT

## 2012-08-20 NOTE — H&P (Signed)
History of Present Illness  Ms Streed had TURBT in March 2013.  She had intravesical BCG.  She voids well, denies dysuria, hematuria.  She has had no recurrence. She needs surveillance cystoscopy.  Cystoscopy today shows two small tumors on the right lateral wall of the bladder.   Past Medical History Problems  1. History of  Nephrolithiasis V13.01 2. History of  Ureteral Stone 592.1  Surgical History Problems  1. History of  Cataract Surgery Bilateral 2. History of  Cystoscopy With Fulguration Medium Lesion (2-5cm) 3. History of  Hand Surgery Left 4. History of  Lithotripsy 5. History of  Tonsillectomy  Current Meds 1. Baclofen 10 MG Oral Tablet; Therapy: 30Sep2013 to 2. Binosto 70 MG Oral Tablet Effervescent; Therapy: 15Nov2013 to 3. Caltrate 600+D TABS; Therapy: (Recorded:11Mar2010) to 4. Hydrochlorothiazide 25 MG Oral Tablet; TAKE 1 TABLET BY MOUTH   EVERY DAY; Therapy:  23Oct2009 to (Last Rx:23Oct2009) 5. Hydrocodone-Acetaminophen 5-500 MG Oral Tablet; 1-2 TABS PO Q 4-6H PRN PAIN; Therapy:  09Jan2014 to (Last Rx:09Jan2014) 6. Pravastatin Sodium 40 MG Oral Tablet; Therapy: 09Oct2012 to 7. ProAir HFA 108 (90 Base) MCG/ACT Inhalation Aerosol Solution; Therapy: 22Aug2012 to 8. Spiriva HandiHaler 18 MCG Inhalation Capsule; Therapy: 24Oct2012 to 9. TiZANidine HCl 4 MG Oral Tablet; Therapy: 27Dec2013 to 10. TraZODone HCl 50 MG Oral Tablet; Therapy: 21Jan2013 to  Allergies Medication  1. Boniva KIT 2. Penicillins  Family History Problems  1. Family history of  Family Health Status Number Of Children 1 son 2. Family history of  Father Deceased At Age ____ 3. Paternal history of  Heart Disease V17.49 4. Maternal history of  Hypertension V17.49 5. Family history of  Mother Deceased At Age ____ 6. Family history of  Nephrolithiasis  Social History Problems  1. Caffeine Use 3 to 4 cups 2. Marital History - Currently Married 3. Tobacco Use 305.1 Denied  4. History of   Alcohol Use  Review of Systems Genitourinary, constitutional, skin, eye, otolaryngeal, hematologic/lymphatic, cardiovascular, pulmonary, endocrine, musculoskeletal, gastrointestinal, neurological and psychiatric system(s) were reviewed and pertinent findings if present are noted.  Musculoskeletal: joint pain.    Vitals Vital Signs [Data Includes: Last 1 Day]  07Apr2014 10:54AM  BMI Calculated: 18.2 BSA Calculated: 1.47 Weight: 104 lb  Blood Pressure: 128 / 68 Heart Rate: 100 Respiration: 18  Physical Exam Constitutional: Well nourished and well developed . No acute distress.  ENT:. The ears and nose are normal in appearance.  Neck: The appearance of the neck is normal and no neck mass is present.  Pulmonary: No respiratory distress and normal respiratory rhythm and effort.  Cardiovascular: Heart rate and rhythm are normal . No peripheral edema.  Abdomen: The abdomen is soft and nontender. No masses are palpated. No CVA tenderness. No hernias are palpable. No hepatosplenomegaly noted.  Genitourinary:  Chaperone Present: .  Examination of the external genitalia shows normal female external genitalia and no lesions. The urethra is normal in appearance and not tender. There is no urethral mass. Vaginal exam demonstrates no abnormalities. The adnexa are palpably normal. The bladder is non tender and not distended. The anus is normal on inspection. The perineum is normal on inspection.  Lymphatics: The femoral and inguinal nodes are not enlarged or tender.  Skin: Normal skin turgor, no visible rash and no visible skin lesions.  Neuro/Psych:. Mood and affect are appropriate.    Results/Data Urine [Data Includes: Last 1 Day]   07Apr2014  COLOR YELLOW   APPEARANCE CLEAR   SPECIFIC GRAVITY 1.010  pH 6.0   GLUCOSE NEG mg/dL  BILIRUBIN NEG   KETONE NEG mg/dL  BLOOD MOD   PROTEIN NEG mg/dL  UROBILINOGEN 0.2 mg/dL  NITRITE NEG   LEUKOCYTE ESTERASE NEG   SQUAMOUS EPITHELIAL/HPF FEW    WBC 0-2 WBC/hpf  RBC 11-20 RBC/hpf  BACTERIA NONE SEEN   CRYSTALS NONE SEEN   CASTS NONE SEEN    Procedure  Procedure: Cystoscopy  Chaperone Present: Sima Matas.  Indication: History of Urothelial Carcinoma.  Informed Consent: Risks, benefits, and potential adverse events were discussed and informed consent was obtained from the patient . Specific risks including, but not limited to bleeding, infection, pain, allergic reaction etc. were explained.  Prep: The patient was prepped with betadine.  Anesthesia:. Local anesthesia was administered intraurethrally with 2% lidocaine jelly.  Antibiotic prophylaxis: Ciprofloxacin.  Procedure Note:  Urethral meatus:. No abnormalities.  Anterior urethra: No abnormalities.  Bladder: Visulization was clear. Multiple tumors were identified in the bladder. A papillary tumor was seen in the bladder measuring approximately 3 cm in size. This tumor was located on the right side, on the posterior aspect, at the base of the bladder. Another papillary tumor was seen in the bladder measuring approximately 2 cm in size. This tumor was located on the right side, on the posterior aspect, at the base of the bladder.    Assessment Assessed  1. Bladder Cancer 188.9  Plan Health Maintenance (V70.0)  1. UA With REFLEX  Done: 07Apr2014 10:22AM   Cipro 250 mgm x 1.  Needs TURBT.  The procedure, risks, benefits were again explained again to her.  The risks include but are not limited to hemorrhage, infection, bladder injury.  She understands and is agreeable.   Signatures Electronically signed by : Su Grand, M.D.; Aug 02 2012  2:32PM

## 2012-08-20 NOTE — Transfer of Care (Signed)
Immediate Anesthesia Transfer of Care Note  Patient: Amber Snow  Procedure(s) Performed: Procedure(s) (LRB): CYSTOSCOPY (N/A)  Patient Location: PACU  Anesthesia Type: General  Level of Consciousness: awake, sedated, patient cooperative and responds to stimulation  Airway & Oxygen Therapy: Patient Spontanous Breathing and Patient connected to face mask oxygen  Post-op Assessment: Report given to PACU RN, Post -op Vital signs reviewed and stable and Patient moving all extremities  Post vital signs: Reviewed and stable  Complications: No apparent anesthesia complications 

## 2012-08-20 NOTE — Anesthesia Procedure Notes (Signed)
Procedure Name: LMA Insertion Date/Time: 08/20/2012 9:18 AM Performed by: Jessica Priest Pre-anesthesia Checklist: Patient identified, Emergency Drugs available, Suction available and Patient being monitored Patient Re-evaluated:Patient Re-evaluated prior to inductionOxygen Delivery Method: Circle System Utilized Preoxygenation: Pre-oxygenation with 100% oxygen Intubation Type: IV induction Ventilation: Mask ventilation without difficulty LMA: LMA inserted LMA Size: 3.0 Number of attempts: 1 Airway Equipment and Method: bite block Placement Confirmation: positive ETCO2 Tube secured with: Tape Dental Injury: Teeth and Oropharynx as per pre-operative assessment

## 2012-08-20 NOTE — Anesthesia Postprocedure Evaluation (Signed)
  Anesthesia Post-op Note  Patient: Amber Snow  Procedure(s) Performed: Procedure(s) (LRB): CYSTOSCOPY (N/A)  Patient Location: PACU  Anesthesia Type: General  Level of Consciousness: awake and alert   Airway and Oxygen Therapy: Patient Spontanous Breathing  Post-op Pain: mild  Post-op Assessment: Post-op Vital signs reviewed, Patient's Cardiovascular Status Stable, Respiratory Function Stable, Patent Airway and No signs of Nausea or vomiting  Last Vitals:  Filed Vitals:   08/20/12 1057  BP: 150/76  Pulse: 68  Temp: 36 C  Resp: 18    Post-op Vital Signs: stable   Complications: No apparent anesthesia complications

## 2012-08-20 NOTE — Op Note (Signed)
Amber Snow is a 79 y.o.   08/20/2012  Preop diagnosis: Recurrent bladder tumor  Postop diagnosis: Same  Procedure done: Cystoscopy, fulguration of bladder tumors  Surgeon: Wendie Simmer. Eliberto Sole  Anesthesia: General  Indication: Patient is a 79 years old female who had a TUR bladder tumor in March 2013. She was treated with intravesical BCG. Surveillance cystoscopy about 3 weeks ago showed 2 small tumors in the bladder.  Procedure: Patient was identified by her wrist band and proper timeout was taken.  Under general anesthesia she was prepped and draped and placed in the dorsolithotomy position. A panendoscope was inserted in the bladder. There are 2 small adjacent papillary tumors on the left side of the bladder near the dome. The tumors are very small and measure in total 2 cm. They are superficial.  With the Bugbee electrode the tumors were fulgurated. A 2 cm resection margin of the bladder mucosa was also fulgurated. Cystoscopy was then performed with the right angle lens and there was no evidence of other tumors in the bladder. There was no bleeding at the end of the procedure. The cystoscope was removed.  The patient tolerated the procedure well and left the OR in satisfactory condition to postanesthesia care unit.

## 2012-08-20 NOTE — Transfer of Care (Signed)
Immediate Anesthesia Transfer of Care Note  Patient: Amber Snow  Procedure(s) Performed: Procedure(s) (LRB): CYSTOSCOPY (N/A)  Patient Location: PACU  Anesthesia Type: General  Level of Consciousness: awake, sedated, patient cooperative and responds to stimulation  Airway & Oxygen Therapy: Patient Spontanous Breathing and Patient connected to face mask oxygen  Post-op Assessment: Report given to PACU RN, Post -op Vital signs reviewed and stable and Patient moving all extremities  Post vital signs: Reviewed and stable  Complications: No apparent anesthesia complications

## 2012-08-20 NOTE — Anesthesia Preprocedure Evaluation (Addendum)
Anesthesia Evaluation  Patient identified by MRN, date of birth, ID band Patient awake    Reviewed: Allergy & Precautions, H&P , NPO status , Patient's Chart, lab work & pertinent test results  Airway Mallampati: II TM Distance: >3 FB Neck ROM: Full    Dental no notable dental hx.    Pulmonary shortness of breath and with exertion, COPD COPD inhaler,  Seen by Dr. Sherene Sires on 08-01-12. Doing better since February admission for COPD exacerbation.  Can shop, do housework without too much SOB breath sounds clear to auscultation  Pulmonary exam normal       Cardiovascular + CAD, + Peripheral Vascular Disease and +CHF + dysrhythmias + Valvular Problems/Murmurs Rhythm:Regular Rate:Normal + Systolic murmurs Chronic LBBB, PACs   Neuro/Psych Low back pain, chronic myalgia and myositis, fatigue.  Neuromuscular disease negative psych ROS   GI/Hepatic negative GI ROS, Neg liver ROS,   Endo/Other  negative endocrine ROS  Renal/GU Renal diseaseBladder tumor.  negative genitourinary   Musculoskeletal negative musculoskeletal ROS (+)   Abdominal   Peds negative pediatric ROS (+)  Hematology negative hematology ROS (+)   Anesthesia Other Findings   Reproductive/Obstetrics negative OB ROS                        Anesthesia Physical Anesthesia Plan  ASA: IV  Anesthesia Plan: General   Post-op Pain Management:    Induction: Intravenous  Airway Management Planned: LMA  Additional Equipment:   Intra-op Plan:   Post-operative Plan: Extubation in OR  Informed Consent: I have reviewed the patients History and Physical, chart, labs and discussed the procedure including the risks, benefits and alternatives for the proposed anesthesia with the patient or authorized representative who has indicated his/her understanding and acceptance.   Dental advisory given  Plan Discussed with: CRNA  Anesthesia Plan Comments:  (Did OK with LMA GA March 2013. Discussed possibility of COPD exacerbation today. Questions answered.)       Anesthesia Quick Evaluation

## 2012-08-23 ENCOUNTER — Encounter (HOSPITAL_BASED_OUTPATIENT_CLINIC_OR_DEPARTMENT_OTHER): Payer: Self-pay | Admitting: Urology

## 2012-09-13 ENCOUNTER — Other Ambulatory Visit: Payer: Self-pay | Admitting: Cardiology

## 2012-09-16 ENCOUNTER — Encounter (HOSPITAL_COMMUNITY): Payer: Self-pay

## 2012-09-16 ENCOUNTER — Inpatient Hospital Stay (HOSPITAL_COMMUNITY)
Admission: EM | Admit: 2012-09-16 | Discharge: 2012-09-20 | DRG: 871 | Disposition: A | Discharge status: Home / Self Care | Payer: Medicare Other | Attending: Internal Medicine | Admitting: Internal Medicine

## 2012-09-16 ENCOUNTER — Emergency Department (HOSPITAL_COMMUNITY): Payer: Medicare Other

## 2012-09-16 DIAGNOSIS — R5383 Other fatigue: Secondary | ICD-10-CM

## 2012-09-16 DIAGNOSIS — J440 Chronic obstructive pulmonary disease with acute lower respiratory infection: Secondary | ICD-10-CM | POA: Diagnosis present

## 2012-09-16 DIAGNOSIS — Z79899 Other long term (current) drug therapy: Secondary | ICD-10-CM

## 2012-09-16 DIAGNOSIS — IMO0002 Reserved for concepts with insufficient information to code with codable children: Secondary | ICD-10-CM

## 2012-09-16 DIAGNOSIS — Z87442 Personal history of urinary calculi: Secondary | ICD-10-CM

## 2012-09-16 DIAGNOSIS — I428 Other cardiomyopathies: Secondary | ICD-10-CM | POA: Diagnosis present

## 2012-09-16 DIAGNOSIS — J189 Pneumonia, unspecified organism: Secondary | ICD-10-CM

## 2012-09-16 DIAGNOSIS — I519 Heart disease, unspecified: Secondary | ICD-10-CM

## 2012-09-16 DIAGNOSIS — I5042 Chronic combined systolic (congestive) and diastolic (congestive) heart failure: Secondary | ICD-10-CM | POA: Diagnosis present

## 2012-09-16 DIAGNOSIS — J441 Chronic obstructive pulmonary disease with (acute) exacerbation: Secondary | ICD-10-CM

## 2012-09-16 DIAGNOSIS — I509 Heart failure, unspecified: Secondary | ICD-10-CM | POA: Diagnosis present

## 2012-09-16 DIAGNOSIS — R011 Cardiac murmur, unspecified: Secondary | ICD-10-CM

## 2012-09-16 DIAGNOSIS — E785 Hyperlipidemia, unspecified: Secondary | ICD-10-CM | POA: Diagnosis present

## 2012-09-16 DIAGNOSIS — Z87891 Personal history of nicotine dependence: Secondary | ICD-10-CM

## 2012-09-16 DIAGNOSIS — R946 Abnormal results of thyroid function studies: Secondary | ICD-10-CM

## 2012-09-16 DIAGNOSIS — R29818 Other symptoms and signs involving the nervous system: Secondary | ICD-10-CM

## 2012-09-16 DIAGNOSIS — C679 Malignant neoplasm of bladder, unspecified: Secondary | ICD-10-CM

## 2012-09-16 DIAGNOSIS — I447 Left bundle-branch block, unspecified: Secondary | ICD-10-CM | POA: Diagnosis present

## 2012-09-16 DIAGNOSIS — E559 Vitamin D deficiency, unspecified: Secondary | ICD-10-CM | POA: Diagnosis present

## 2012-09-16 DIAGNOSIS — I739 Peripheral vascular disease, unspecified: Secondary | ICD-10-CM

## 2012-09-16 DIAGNOSIS — R930 Abnormal findings on diagnostic imaging of skull and head, not elsewhere classified: Secondary | ICD-10-CM

## 2012-09-16 DIAGNOSIS — J209 Acute bronchitis, unspecified: Secondary | ICD-10-CM | POA: Diagnosis present

## 2012-09-16 DIAGNOSIS — K5730 Diverticulosis of large intestine without perforation or abscess without bleeding: Secondary | ICD-10-CM

## 2012-09-16 DIAGNOSIS — J4489 Other specified chronic obstructive pulmonary disease: Secondary | ICD-10-CM

## 2012-09-16 DIAGNOSIS — A419 Sepsis, unspecified organism: Principal | ICD-10-CM

## 2012-09-16 DIAGNOSIS — R1319 Other dysphagia: Secondary | ICD-10-CM

## 2012-09-16 DIAGNOSIS — N200 Calculus of kidney: Secondary | ICD-10-CM

## 2012-09-16 DIAGNOSIS — M545 Low back pain, unspecified: Secondary | ICD-10-CM

## 2012-09-16 DIAGNOSIS — J180 Bronchopneumonia, unspecified organism: Secondary | ICD-10-CM

## 2012-09-16 DIAGNOSIS — T887XXA Unspecified adverse effect of drug or medicament, initial encounter: Secondary | ICD-10-CM

## 2012-09-16 DIAGNOSIS — J449 Chronic obstructive pulmonary disease, unspecified: Secondary | ICD-10-CM | POA: Diagnosis present

## 2012-09-16 DIAGNOSIS — I2510 Atherosclerotic heart disease of native coronary artery without angina pectoris: Secondary | ICD-10-CM | POA: Diagnosis present

## 2012-09-16 DIAGNOSIS — R0902 Hypoxemia: Secondary | ICD-10-CM | POA: Diagnosis present

## 2012-09-16 DIAGNOSIS — R0603 Acute respiratory distress: Secondary | ICD-10-CM

## 2012-09-16 DIAGNOSIS — M949 Disorder of cartilage, unspecified: Secondary | ICD-10-CM

## 2012-09-16 DIAGNOSIS — J9600 Acute respiratory failure, unspecified whether with hypoxia or hypercapnia: Secondary | ICD-10-CM | POA: Diagnosis present

## 2012-09-16 DIAGNOSIS — R0600 Dyspnea, unspecified: Secondary | ICD-10-CM

## 2012-09-16 DIAGNOSIS — M2550 Pain in unspecified joint: Secondary | ICD-10-CM

## 2012-09-16 DIAGNOSIS — R911 Solitary pulmonary nodule: Secondary | ICD-10-CM

## 2012-09-16 DIAGNOSIS — D72829 Elevated white blood cell count, unspecified: Secondary | ICD-10-CM

## 2012-09-16 DIAGNOSIS — R5381 Other malaise: Secondary | ICD-10-CM

## 2012-09-16 DIAGNOSIS — R000 Tachycardia, unspecified: Secondary | ICD-10-CM | POA: Diagnosis present

## 2012-09-16 DIAGNOSIS — M899 Disorder of bone, unspecified: Secondary | ICD-10-CM

## 2012-09-16 DIAGNOSIS — IMO0001 Reserved for inherently not codable concepts without codable children: Secondary | ICD-10-CM

## 2012-09-16 LAB — CBC WITH DIFFERENTIAL/PLATELET
Basophils Absolute: 0 10*3/uL (ref 0.0–0.1)
Basophils Relative: 0 % (ref 0–1)
HCT: 37.2 % (ref 36.0–46.0)
Hemoglobin: 12.8 g/dL (ref 12.0–15.0)
Lymphocytes Relative: 4 % — ABNORMAL LOW (ref 12–46)
MCHC: 34.4 g/dL (ref 30.0–36.0)
Monocytes Absolute: 0.9 10*3/uL (ref 0.1–1.0)
Monocytes Relative: 5 % (ref 3–12)
Neutro Abs: 16.6 10*3/uL — ABNORMAL HIGH (ref 1.7–7.7)
Neutrophils Relative %: 91 % — ABNORMAL HIGH (ref 43–77)
WBC: 18.2 10*3/uL — ABNORMAL HIGH (ref 4.0–10.5)

## 2012-09-16 LAB — CBC
Hemoglobin: 12.3 g/dL (ref 12.0–15.0)
MCH: 30.6 pg (ref 26.0–34.0)
MCHC: 33.3 g/dL (ref 30.0–36.0)
Platelets: 205 10*3/uL (ref 150–400)
RDW: 13.2 % (ref 11.5–15.5)

## 2012-09-16 LAB — TROPONIN I: Troponin I: <0.3 ng/mL (ref <0.30)

## 2012-09-16 LAB — URINALYSIS, ROUTINE W REFLEX MICROSCOPIC
Glucose, UA: NEGATIVE mg/dL
Nitrite: NEGATIVE
Specific Gravity, Urine: 1.021 (ref 1.005–1.030)
pH: 7.5 (ref 5.0–8.0)

## 2012-09-16 LAB — BASIC METABOLIC PANEL
BUN: 18 mg/dL (ref 6–23)
CO2: 24 mEq/L (ref 19–32)
Chloride: 103 mEq/L (ref 96–112)
Creatinine, Ser: 0.68 mg/dL (ref 0.50–1.10)
GFR calc Af Amer: >90 mL/min (ref >90)
Potassium: 3.9 mEq/L (ref 3.5–5.1)

## 2012-09-16 LAB — CREATININE, SERUM
Creatinine, Ser: 0.75 mg/dL (ref 0.50–1.10)
GFR calc non Af Amer: 80 mL/min — ABNORMAL LOW (ref >90)

## 2012-09-16 MED ORDER — ALBUTEROL SULFATE (5 MG/ML) 0.5% IN NEBU
2.5000 mg | INHALATION_SOLUTION | RESPIRATORY_TRACT | Status: DC
  Administered 2012-09-16 – 2012-09-17 (×4): 2.5 mg via RESPIRATORY_TRACT
  Filled 2012-09-16 (×4): qty 0.5

## 2012-09-16 MED ORDER — IPRATROPIUM BROMIDE 0.02 % IN SOLN
0.5000 mg | RESPIRATORY_TRACT | Status: DC
  Administered 2012-09-16 – 2012-09-17 (×4): 0.5 mg via RESPIRATORY_TRACT
  Filled 2012-09-16 (×4): qty 2.5

## 2012-09-16 MED ORDER — TRAZODONE HCL 50 MG PO TABS
50.0000 mg | ORAL_TABLET | Freq: Every day | ORAL | Status: DC
  Administered 2012-09-16 – 2012-09-19 (×4): 50 mg via ORAL
  Filled 2012-09-16 (×5): qty 1

## 2012-09-16 MED ORDER — BUDESONIDE-FORMOTEROL FUMARATE 160-4.5 MCG/ACT IN AERO
2.0000 | INHALATION_SPRAY | Freq: Two times a day (BID) | RESPIRATORY_TRACT | Status: DC
  Administered 2012-09-17: 2 via RESPIRATORY_TRACT
  Filled 2012-09-16 (×2): qty 6

## 2012-09-16 MED ORDER — LOSARTAN POTASSIUM 50 MG PO TABS
50.0000 mg | ORAL_TABLET | Freq: Every day | ORAL | Status: DC
  Administered 2012-09-16 – 2012-09-20 (×5): 50 mg via ORAL
  Filled 2012-09-16 (×6): qty 1

## 2012-09-16 MED ORDER — VITAMIN E 180 MG (400 UNIT) PO CAPS
400.0000 [IU] | ORAL_CAPSULE | Freq: Every day | ORAL | Status: DC
  Administered 2012-09-16 – 2012-09-20 (×5): 400 [IU] via ORAL
  Filled 2012-09-16 (×5): qty 1

## 2012-09-16 MED ORDER — IPRATROPIUM BROMIDE 0.02 % IN SOLN
0.5000 mg | Freq: Once | RESPIRATORY_TRACT | Status: AC
  Administered 2012-09-16: 0.5 mg via RESPIRATORY_TRACT
  Filled 2012-09-16: qty 2.5

## 2012-09-16 MED ORDER — PREDNISONE 20 MG PO TABS
60.0000 mg | ORAL_TABLET | Freq: Once | ORAL | Status: AC
  Administered 2012-09-16: 60 mg via ORAL
  Filled 2012-09-16: qty 3

## 2012-09-16 MED ORDER — DEXTROSE 5 % IV SOLN: 1.0000 g | Freq: Once | INTRAVENOUS | Status: DC

## 2012-09-16 MED ORDER — ACETAMINOPHEN 325 MG PO TABS: 650.0000 mg | ORAL_TABLET | Freq: Four times a day (QID) | ORAL | Status: DC | PRN

## 2012-09-16 MED ORDER — CALCIUM CARBONATE 1500 (600 CA) MG PO TABS: 2.5000 | ORAL_TABLET | Freq: Every day | ORAL | Status: DC

## 2012-09-16 MED ORDER — ALBUTEROL SULFATE (5 MG/ML) 0.5% IN NEBU
5.0000 mg | INHALATION_SOLUTION | Freq: Once | RESPIRATORY_TRACT | Status: AC
  Administered 2012-09-16: 5 mg via RESPIRATORY_TRACT
  Filled 2012-09-16: qty 1

## 2012-09-16 MED ORDER — VITAMIN C 500 MG PO TABS
1000.0000 mg | ORAL_TABLET | Freq: Every day | ORAL | Status: DC
  Administered 2012-09-16 – 2012-09-20 (×5): 1000 mg via ORAL
  Filled 2012-09-16 (×7): qty 2

## 2012-09-16 MED ORDER — DEXTROSE 5 % IV SOLN: 500.0000 mg | Freq: Once | INTRAVENOUS | Status: DC

## 2012-09-16 MED ORDER — VITAMIN D 1000 UNITS PO TABS
1000.0000 [IU] | ORAL_TABLET | Freq: Every day | ORAL | Status: DC
  Administered 2012-09-16 – 2012-09-20 (×5): 1000 [IU] via ORAL
  Filled 2012-09-16 (×5): qty 1

## 2012-09-16 MED ORDER — CALCIUM CARBONATE 1250 (500 CA) MG PO TABS
2.5000 | ORAL_TABLET | Freq: Every day | ORAL | Status: DC
  Administered 2012-09-17 – 2012-09-20 (×4): 1250 mg via ORAL
  Filled 2012-09-16 (×5): qty 2.5

## 2012-09-16 MED ORDER — SIMVASTATIN 20 MG PO TABS: 20.0000 mg | ORAL_TABLET | Freq: Every day | ORAL | Status: DC

## 2012-09-16 MED ORDER — ONDANSETRON HCL 4 MG PO TABS: 4.0000 mg | ORAL_TABLET | Freq: Four times a day (QID) | ORAL | Status: DC | PRN

## 2012-09-16 MED ORDER — ONDANSETRON HCL 4 MG/2ML IJ SOLN: 4.0000 mg | Freq: Four times a day (QID) | INTRAMUSCULAR | Status: DC | PRN

## 2012-09-16 MED ORDER — LEVOFLOXACIN IN D5W 750 MG/150ML IV SOLN
750.0000 mg | INTRAVENOUS | Status: DC
  Administered 2012-09-16 – 2012-09-17 (×2): 750 mg via INTRAVENOUS
  Filled 2012-09-16 (×3): qty 150

## 2012-09-16 MED ORDER — ACETAMINOPHEN 650 MG RE SUPP: 650.0000 mg | Freq: Four times a day (QID) | RECTAL | Status: DC | PRN

## 2012-09-16 MED ORDER — DEXTROSE 5 % IV SOLN
500.0000 mg | Freq: Once | INTRAVENOUS | Status: AC
  Administered 2012-09-16: 500 mg via INTRAVENOUS
  Filled 2012-09-16: qty 500

## 2012-09-16 MED ORDER — DEXTROSE 5 % IV SOLN
1.0000 g | Freq: Once | INTRAVENOUS | Status: AC
  Administered 2012-09-16: 1 g via INTRAVENOUS
  Filled 2012-09-16: qty 10

## 2012-09-16 MED ORDER — ENOXAPARIN SODIUM 40 MG/0.4ML ~~LOC~~ SOLN
40.0000 mg | SUBCUTANEOUS | Status: DC
  Administered 2012-09-16 – 2012-09-19 (×4): 40 mg via SUBCUTANEOUS
  Filled 2012-09-16 (×5): qty 0.4

## 2012-09-16 MED ORDER — PRAVASTATIN SODIUM 40 MG PO TABS
40.0000 mg | ORAL_TABLET | Freq: Every evening | ORAL | Status: DC
  Administered 2012-09-16 – 2012-09-19 (×4): 40 mg via ORAL
  Filled 2012-09-16 (×6): qty 1

## 2012-09-16 NOTE — ED Notes (Signed)
Patient transported to X-ray 

## 2012-09-16 NOTE — ED Notes (Addendum)
Pt states she started feeling short of breath since this am.  States she feels cold-had a 102 temp prior to arrival here.  Has not taken motrin since early this am.  States a hx of pna and states this feels similar.

## 2012-09-16 NOTE — ED Provider Notes (Signed)
Pt with hx of COPD - has shortness of breath, worse over the last 24 hours, associated with coughing and fever. On exam the patient has increased work of breathing, mild tachypnea, decreased lung sounds in all lung fields with occasional rales. There is no significant peripheral edema, no murmurs rubs or gallops, normal mental status.  I personally interpreted her chest x-ray and find her to be signs of small focal for pneumonia, no signs of pneumothorax, no abnormal mediastinum, no subdiaphragmatic air. Her laboratory workup shows a leukocytosis of 18,000, this is suspicious for infectious etiology causing a COPD exacerbation. She was given DuoNeb, antibiotics, improved and maintained an oxygen saturation about 95%. She will be admitted to the hospital, she appears improved  Medical screening examination/treatment/procedure(s) were conducted as a shared visit with non-physician practitioner(s) and myself.  I personally evaluated the patient during the encounter    Vida Roller, MD 09/16/12 Windell Moment

## 2012-09-16 NOTE — H&P (Signed)
[facility] History and Physical  FELISA ZECHMAN ZOX:[id number] DOB: April 05, 1933 DOA: 09/16/2012   PCP: Marga Melnick, MD   Chief Complaint: fatigue and sob  HPI:  79 year old female with a history of COPD, chronic systolic CHF, CAD, diverticulosis, bladder tumors, andLBBB presents with a two-day history of feeling "tired and shortness of breath. The patient states that yesterday after visiting some friends, she just felt tired and "I just didn't feel good". She also had some associated shortness of breath and chest discomfort intermittently. This was at rest as well as on exertion. She woke up this morning, the patient continued to describe the feeling of "feeling tired". Associated with this, the patient again described shortness of breath. Around 11:00 o'clock on the morning before admission, the patient noted some chills and took her temperature, and it was noted to be 102.66F at home. Her symptoms continued to progress and did not remit with use of her rescue inhalers. As a result she came to the emergency department for further evaluation. She also complains of the worsening dry cough without hemoptysis. She denies any dizziness, nausea, vomiting, diaphoresis, abdominal pain, dysuria, hematuria, syncope. The patient was discharged from [facility] on 06/05/2012 after admission for COPD exacerbation. The patient has not been hospitalized nor has she been placed on any additional prednisone or antibiotics since that period of time.  In the emergency department, the patient was given aerosolized albuterol and Atrovent as well as prednisone 60 mg. The patient was started on ceftriaxone and azithromycin. Initially, the patient was hypoxemic with oxygen saturation of 85% on room air which improved to 95% on 2 L. Show temperature 100.63F, WBC 18.2. BMP was unremarkable. Urinalysis negative for pyuria. Chest x-ray showed left lower lobe opacity and slight increase in right lower lobe  opacity. Assessment/Plan: Sepsis -The patient was noted to have WBC 18.2, fever, and tachycardia with chest x-ray findings suggestive of pneumonia Community acquired pneumonia -The patient has not been re-admitted over 90 days -Unfortunately blood cultures were not obtained prior to giving the patient antibiotics -Start the patient on IV levofloxacin -Aerosolized Albuterol and Atrovent every 4 hours -Urine Legionella antigen -Urine Streptococcus pneumoniae antigen COPD -PFTs on 07/29/2012 shows FEV1 of 63% -Continue Symbicort -Aerosolized albuterol and Atrovent while the patient is in the hospital Nonischemic cardiomyopathy  -no signs of decompensation  -Ejection fraction 35-40% , grade 1 diastolic dysfunction -Continue losartan Elevated proBNP  -Likely related to the patient's cardiomyopathy and tachycardia -Clinically, patient is not fluid overloaded  Left bundle-branch block  -Appears to be chronic  -Heart catheterization on 09/12/2011 revealed minimal nonobstructive coronary disease  Hyperlipidemia  -Continue pravastatin  Lung nodule  -Noted on chest x-ray 07/29/2012 right apex -Will need outpatient followup/PET scan  Family Communication: Husband at beside          Past Medical History  Diagnosis Date  . Carotid bruit     Right  . Diverticulosis of colon   . Hyperlipidemia   . History of nephrolithiasis   . Osteopenia   . COPD, moderate   . Cough   . Skipped beats   . Arthritis HANDS  . CAD (coronary artery disease) 09/12/2011    minimal nonobstructive CAD per cath with EF of 35 to 40% and normal filling pressures and right heart pressures  . LV dysfunction   . Chronic systolic CHF (congestive heart failure)   . 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 HOH (hard of hearing)     bil hearing aids  Past Surgical History  Procedure Laterality Date  . Cosmetic surgery      eyelids  . Left long finger arthroplasty & pulley relase  02-17-2005  . Cysto/ left retrograde  pyelogram/ left ureteral stent placement  10-16-2001    STONE  . Pulmonary function analysis  07-31-2009    MODERATE TO SEVERE OBSTRUCTIVE AIRWAY DISEASE/ INSIGNIFICANT RESPONSE TO BRONCHODILATORS/ EMPHYSEMA PATTERN/ MILD DECREASE IN DIFFUSE CAPACITY FEF 25-75 CHANGED BY 8%  . Tubal ligation  AGE 38'S  . Tonsillectomy  CHILD  . Extracorporeal shock wave lithotripsy  2003  . Cataract extraction w/ intraocular lens  implant, bilateral  1998  . Transurethral resection of bladder tumor  07/01/2011    Procedure: TRANSURETHRAL RESECTION OF BLADDER TUMOR (TURBT);  Surgeon: Lindaann Slough, MD;  Location: [facility];  Service: Urology;  Laterality: N/A;  CYSTO  . Cystoscopy  07/01/2011    Procedure: CYSTOSCOPY;  Surgeon: Lindaann Slough, MD;  Location: [facility];  Service: Urology;  Laterality: N/A;  retrieval l of bladder stone  . Orif finger / thumb fracture    . Cystoscopy N/A 08/20/2012    Procedure: CYSTOSCOPY;  Surgeon: Lindaann Slough, MD;  Location: [facility];  Service: Urology;  Laterality: N/A;  fulgeration of bladderm tumors   Social History:  reports that she quit smoking about a year ago. Her smoking use included Cigarettes. She has a 50 pack-year smoking history. She has never used smokeless tobacco. She reports that she does not drink alcohol or use illicit drugs.   Family History  Problem Relation Age of Onset  . Heart disease Mother   . Hypertension Mother   . Hypertension Father   . Heart attack Father   . Heart disease Father   . Thyroid disease Sister   . Heart attack Sister   . Stroke Sister   . COPD Sister   . Heart attack Brother   . Cancer Maternal Grandmother     breast cancer  . Diabetes Paternal Grandmother   . Asthma Neg Hx      Allergies  Allergen Reactions  . Barbiturates     REACTION: rash  . Penicillins Rash  . Shrimp (Shellfish Allergy) Swelling  . Aspirin     REACTION: nausea  . Beta Adrenergic  Blockers     PT STATES TOLD TO AVOID DUE TO COUGH REACTION TO CARDIZEM  . Calcium Channel Blockers     PER PT TOLD TO AVOID DUE TO COUGH REACTION TO CARDIZEM  . Celecoxib     REACTION: nausea  . Codeine     REACTION: excessive sleep  . Diltiazem Hcl Other (See Comments)    REACTION: cough  . Fluvastatin Sodium     REACTION: intolerance  . Ibandronate Sodium Other (See Comments)    REACTION: HAIR LOSS/NAIL CHANGES  . Rosuvastatin     REACTION: intolerance  . Simvastatin     REACTION: intolerance      Prior to Admission medications   Medication Sig Start Date End Date Taking? Authorizing Provider  albuterol (PROVENTIL HFA;VENTOLIN HFA) 108 (90 BASE) MCG/ACT inhaler Inhale 2 puffs into the lungs every 6 (six) hours as needed. Shortness of breath    Historical Provider, MD  Ascorbic Acid (VITAMIN C) 1000 MG tablet Take 1,000 mg by mouth daily.     Historical Provider, MD  BIOTIN PO Take 1 capsule by mouth daily.    Historical Provider, MD  budesonide-formoterol (SYMBICORT) 160-4.5 MCG/ACT inhaler Inhale 2 puffs  into the lungs 2 (two) times daily. 03/04/12   Pecola Lawless, MD  Calcium Carbonate (CALTRATE 600) 1500 MG TABS Take 2.5 tablets by mouth daily.     Historical Provider, MD  Cholecalciferol (VITAMIN D) 1000 UNITS capsule Take 1,000 Units by mouth daily.     Historical Provider, MD  losartan (COZAAR) 50 MG tablet TAKE 1 TABLET BY MOUTH DAILY 09/13/12   Peter M [place], MD  Multiple Vitamin (MULITIVITAMIN WITH MINERALS) TABS Take 1 tablet by mouth daily.    Historical Provider, MD  pravastatin (PRAVACHOL) 40 MG tablet Take 1 tablet (40 mg total) by mouth at bedtime. 07/01/12   Pecola Lawless, MD  SPIRIVA HANDIHALER 18 MCG inhalation capsule USE 1 INHALALTION EVERY DAY 08/17/12   Nyoka Cowden, MD  tiotropium (SPIRIVA) 18 MCG inhalation capsule Place 18 mcg into inhaler and inhale daily.    Historical Provider, MD  traZODone (DESYREL) 50 MG tablet Take 50 mg by mouth at bedtime.     Historical Provider, MD  traZODone (DESYREL) 50 MG tablet TAKE 1 TABLET EVERY DAY 08/16/12   Pecola Lawless, MD  vitamin E 400 UNIT capsule Take 400 Units by mouth daily.     Historical Provider, MD  zoledronic acid (RECLAST) 5 MG/100ML SOLN Inject 5 mg into the vein once. Every year    Historical Provider, MD    Review of Systems:  Constitutional:  No weight loss, night sweats Head&Eyes: No headache.  No vision loss.  No eye pain or scotoma ENT:  No Difficulty swallowing,Tooth/dental problems,Sore throat,   Cardio-vascular:  No chest pain, Orthopnea, PND, swelling in lower extremities,  dizziness, palpitations  GI:  No  abdominal pain, nausea, vomiting, diarrhea, loss of appetite, hematochezia, melena, heartburn, indigestion, Resp:  No shortness of breath with exertion or at rest.  No coughing up of blood  Skin:  no rash or lesions.  GU:  no dysuria, change in color of urine, no urgency or frequency. No flank pain.  Musculoskeletal:  No joint pain or swelling. No decreased range of motion. No back pain.  Psych:  No change in mood or affect. No depression Neurologic: No headache, no dysesthesia, no focal weakness, no vision loss. No syncope  Physical Exam: Filed Vitals:   09/16/12 1652 09/16/12 1656 09/16/12 1747 09/16/12 1829  BP: 147/68   134/57  Pulse: 126 122  113  Temp: 100.4 F (38 C)     TempSrc: Oral     Resp: 30   27  SpO2: 85% 92% 95% 93%   General:  A&O x 3, NAD, nontoxic, pleasant/cooperative Head/Eye: No conjunctival hemorrhage, no icterus, Blain/AT, No nystagmus ENT:  No icterus,  No thrush, good dentition, no pharyngeal exudate Neck:  No masses, no lymphadenpathy, no bruits CV:  RRR, no rub, no gallop, no S3 Lung:  Bibasilar crackles, right greater than left. Diminished breath sounds right base. Mild expiratory wheezing anteriorly. Abdomen: soft/NT, +BS, nondistended, no peritoneal signs Ext: No cyanosis, No rashes, No petechiae, No lymphangitis, No  edema   Labs on Admission:  Basic Metabolic Panel:  Recent Labs Lab 09/16/12 1715  NA 138  K 3.9  CL 103  CO2 24  GLUCOSE 116*  BUN 18  CREATININE 0.68  CALCIUM 9.4   Liver Function Tests: No results found for this basename: AST, ALT, ALKPHOS, BILITOT, PROT, ALBUMIN,  in the last 168 hours No results found for this basename: LIPASE, AMYLASE,  in the last 168 hours No results found  for this basename: AMMONIA,  in the last 168 hours CBC:  Recent Labs Lab 09/16/12 1715  WBC 18.2*  NEUTROABS 16.6*  HGB 12.8  HCT 37.2  MCV 91.2  PLT 236   Cardiac Enzymes: No results found for this basename: CKTOTAL, CKMB, CKMBINDEX, TROPONINI,  in the last 168 hours BNP: No components found with this basename: POCBNP,  CBG: No results found for this basename: GLUCAP,  in the last 168 hours  Radiological Exams on Admission: Dg Chest 2 View  09/16/2012   *RADIOLOGY REPORT*  Clinical Data: Fever.  Shortness of breath.  Current history of COPD/emphysema.  CHEST - 2 VIEW  Comparison: Two-view chest x-ray 07/29/2012, 06/01/2012, 09/04/2011.  Findings: Suboptimal inspiration.  Prominent bronchovascular markings diffusely and moderate central peribronchial thickening, more so than on the prior examinations. Patchy opacity in the left lower lobe.  Biapical pleuroparenchymal scarring, unchanged.  Lungs otherwise clear.  No convincing pleural effusions.  Mild degenerative changes involving the thoracic spine.  Cardiac silhouette normal in size, unchanged.  Thoracic aorta atherosclerotic, unchanged.  Hilar and mediastinal contours otherwise unremarkable.  IMPRESSION: Moderate changes of acute bronchitis and/or asthma with probable bronchopneumonia involving the left lower lobe, superimposed upon baseline COPD/emphysema.   Original Report Authenticated By: Hulan Saas, M.D.    EKG: Independently reviewed. Sinus tachycardia, left bundle branch block    Time spent:70 minutes Code Status:    FULL Family Communication:   Family at bedside   Olusegun Gerstenberger, DO  [facility] Pager [phone]  If 7PM-7AM, please contact night-coverage [email or web address] Password [facility] 09/16/2012, 6:59 PM

## 2012-09-16 NOTE — ED Provider Notes (Signed)
History     CSN: [id number]  Arrival date & time 09/16/12  1645   First MD Initiated Contact with Patient 09/16/12 1651      Chief Complaint  Patient presents with  . Shortness of Breath  . Fever    (Consider location/radiation/quality/duration/timing/severity/associated sxs/prior treatment) HPI Comments: 79 year old female with a past medical history of COPD, CAD, chronic systolic CHF, bladder tumors among multiple other medical problems presents to the emergency department complaining of a 2 onset shortness of breath when she woke up this morning. Shortness of breath is present both at rest and on exertion. States this feels similar to when she had pneumonia in the past. Admits to associated fever of 102 prior to arrival at the emergency department. When she woke up this morning she took an Advil since she just "did not feel well", however no other intervention has been done. Yesterday she and her husband were visiting a friend at the hospital and were around multiple sick people. Admits to associated chest tightness and fatigue. Denies chest pain, nausea, vomiting, urinary or bowel changes, confusion, lightheadedness, dizziness or extremity edema. She is not oxygen dependent at home. Pulmonologist Dr. Sherene Sires, PCP Dr. Alwyn Ren.  Patient is a 79 y.o. female presenting with shortness of breath and fever. The history is provided by the patient and the spouse.  Shortness of Breath Associated symptoms: fever   Fever   Past Medical History  Diagnosis Date  . Carotid bruit     Right  . Diverticulosis of colon   . Hyperlipidemia   . History of nephrolithiasis   . Osteopenia   . COPD, moderate   . Cough   . Skipped beats   . Arthritis HANDS  . CAD (coronary artery disease) 09/12/2011    minimal nonobstructive CAD per cath with EF of 35 to 40% and normal filling pressures and right heart pressures  . LV dysfunction   . Chronic systolic CHF (congestive heart failure)   . Tumors     in  bladder  . HOH (hard of hearing)     bil hearing aids    Past Surgical History  Procedure Laterality Date  . Cosmetic surgery      eyelids  . Left long finger arthroplasty & pulley relase  02-17-2005  . Cysto/ left retrograde pyelogram/ left ureteral stent placement  10-16-2001    STONE  . Pulmonary function analysis  07-31-2009    MODERATE TO SEVERE OBSTRUCTIVE AIRWAY DISEASE/ INSIGNIFICANT RESPONSE TO BRONCHODILATORS/ EMPHYSEMA PATTERN/ MILD DECREASE IN DIFFUSE CAPACITY FEF 25-75 CHANGED BY 8%  . Tubal ligation  AGE 89'S  . Tonsillectomy  CHILD  . Extracorporeal shock wave lithotripsy  2003  . Cataract extraction w/ intraocular lens  implant, bilateral  1998  . Transurethral resection of bladder tumor  07/01/2011    Procedure: TRANSURETHRAL RESECTION OF BLADDER TUMOR (TURBT);  Surgeon: Lindaann Slough, MD;  Location: [facility];  Service: Urology;  Laterality: N/A;  CYSTO  . Cystoscopy  07/01/2011    Procedure: CYSTOSCOPY;  Surgeon: Lindaann Slough, MD;  Location: [facility];  Service: Urology;  Laterality: N/A;  retrieval l of bladder stone  . Orif finger / thumb fracture    . Cystoscopy N/A 08/20/2012    Procedure: CYSTOSCOPY;  Surgeon: Lindaann Slough, MD;  Location: [facility];  Service: Urology;  Laterality: N/A;  fulgeration of bladderm tumors    Family History  Problem Relation Age of Onset  . Heart disease Mother   .  Hypertension Mother   . Hypertension Father   . Heart attack Father   . Heart disease Father   . Thyroid disease Sister   . Heart attack Sister   . Stroke Sister   . COPD Sister   . Heart attack Brother   . Cancer Maternal Grandmother     breast cancer  . Diabetes Paternal Grandmother   . Asthma Neg Hx     History  Substance Use Topics  . Smoking status: Former Smoker -- 1.00 packs/day for 50 years    Types: Cigarettes    Quit date: 09/27/2011  . Smokeless tobacco: Never Used     Comment: statrted age  23- 51, up to 1ppd for ? years  . Alcohol Use: No    OB History   Grav Para Term Preterm Abortions TAB SAB Ect Mult Living                  Review of Systems  Constitutional: Positive for fever.  Respiratory: Positive for shortness of breath.     Allergies  Barbiturates; Penicillins; Shrimp; Aspirin; Beta adrenergic blockers; Calcium channel blockers; Celecoxib; Codeine; Diltiazem hcl; Fluvastatin sodium; Ibandronate sodium; Rosuvastatin; and Simvastatin  Home Medications   Current Outpatient Rx  Name  Route  Sig  Dispense  Refill  . albuterol (PROVENTIL HFA;VENTOLIN HFA) 108 (90 BASE) MCG/ACT inhaler   Inhalation   Inhale 2 puffs into the lungs every 6 (six) hours as needed. Shortness of breath         . Ascorbic Acid (VITAMIN C) 1000 MG tablet   Oral   Take 1,000 mg by mouth daily.          Marland Kitchen BIOTIN PO   Oral   Take 1 capsule by mouth daily.         . budesonide-formoterol (SYMBICORT) 160-4.5 MCG/ACT inhaler   Inhalation   Inhale 2 puffs into the lungs 2 (two) times daily.   1 Inhaler   3   . Calcium Carbonate (CALTRATE 600) 1500 MG TABS   Oral   Take 2.5 tablets by mouth daily.          . Cholecalciferol (VITAMIN D) 1000 UNITS capsule   Oral   Take 1,000 Units by mouth daily.          Marland Kitchen losartan (COZAAR) 50 MG tablet      TAKE 1 TABLET BY MOUTH DAILY   30 tablet   11   . Multiple Vitamin (MULITIVITAMIN WITH MINERALS) TABS   Oral   Take 1 tablet by mouth daily.         . pravastatin (PRAVACHOL) 40 MG tablet   Oral   Take 1 tablet (40 mg total) by mouth at bedtime.   90 tablet   3   . SPIRIVA HANDIHALER 18 MCG inhalation capsule      USE 1 INHALALTION EVERY DAY   30 capsule   5   . tiotropium (SPIRIVA) 18 MCG inhalation capsule   Inhalation   Place 18 mcg into inhaler and inhale daily.         . traZODone (DESYREL) 50 MG tablet   Oral   Take 50 mg by mouth at bedtime.         . traZODone (DESYREL) 50 MG tablet      TAKE  1 TABLET EVERY DAY   90 tablet   0   . vitamin E 400 UNIT capsule   Oral   Take 400 Units by  mouth daily.          . zoledronic acid (RECLAST) 5 MG/100ML SOLN   Intravenous   Inject 5 mg into the vein once. Every year           BP 147/68  Pulse 122  Temp(Src) 100.4 F (38 C) (Oral)  Resp 30  SpO2 92%  Physical Exam  Nursing note and vitals reviewed. Constitutional: She is oriented to person, place, and time. She appears well-developed and well-nourished. No distress. Nasal cannula in place.  HENT:  Head: Normocephalic and atraumatic.  Mouth/Throat: Mucous membranes are dry. No posterior oropharyngeal edema or posterior oropharyngeal erythema.  Eyes: Conjunctivae and EOM are normal. Pupils are equal, round, and reactive to light.  Neck: Normal range of motion. Neck supple. No tracheal deviation present.  Cardiovascular: Regular rhythm, normal heart sounds and intact distal pulses.  Tachycardia present.   No extremity edema.  Pulmonary/Chest: Tachypnea noted. No respiratory distress. She has no wheezes. She has rhonchi (expiratory, scattered left). She has no rales.  Poor air movement anterior/posterior bilateral.  Abdominal: Soft. Bowel sounds are normal. There is no tenderness.  Musculoskeletal: Normal range of motion. She exhibits no edema.  Neurological: She is alert and oriented to person, place, and time.  Skin: Skin is warm and dry. She is not diaphoretic.  Psychiatric: She has a normal mood and affect. Her behavior is normal.    ED Course  Procedures (including critical care time)  Labs Reviewed  CBC WITH DIFFERENTIAL - Abnormal; Notable for the following:    WBC 18.2 (*)    Neutrophils Relative % 91 (*)    Neutro Abs 16.6 (*)    Lymphocytes Relative 4 (*)    All other components within normal limits  BASIC METABOLIC PANEL - Abnormal; Notable for the following:    Glucose, Bld 116 (*)    GFR calc non Af Amer 82 (*)    All other components within normal  limits  URINALYSIS, ROUTINE W REFLEX MICROSCOPIC - Abnormal; Notable for the following:    Hgb urine dipstick SMALL (*)    Ketones, ur 40 (*)    Leukocytes, UA SMALL (*)    All other components within normal limits  PRO B NATRIURETIC PEPTIDE - Abnormal; Notable for the following:    Pro B Natriuretic peptide (BNP) 1065.0 (*)    All other components within normal limits  URINE MICROSCOPIC-ADD ON   Dg Chest 2 View  09/16/2012   *RADIOLOGY REPORT*  Clinical Data: Fever.  Shortness of breath.  Current history of COPD/emphysema.  CHEST - 2 VIEW  Comparison: Two-view chest x-ray 07/29/2012, 06/01/2012, 09/04/2011.  Findings: Suboptimal inspiration.  Prominent bronchovascular markings diffusely and moderate central peribronchial thickening, more so than on the prior examinations. Patchy opacity in the left lower lobe.  Biapical pleuroparenchymal scarring, unchanged.  Lungs otherwise clear.  No convincing pleural effusions.  Mild degenerative changes involving the thoracic spine.  Cardiac silhouette normal in size, unchanged.  Thoracic aorta atherosclerotic, unchanged.  Hilar and mediastinal contours otherwise unremarkable.  IMPRESSION: Moderate changes of acute bronchitis and/or asthma with probable bronchopneumonia involving the left lower lobe, superimposed upon baseline COPD/emphysema.   Original Report Authenticated By: Hulan Saas, M.D.     1. Respiratory distress   2. Bronchopneumonia       MDM  79 y/o female with bronchopneumonia. Leukocytosis of 18.2. Initial O2 sat 85% on RA, improved to 94% on 2L O2. Duo-neb given with great relief for patient. Due to  initial presentation, significant history patient will be admitted to hospital. Admission accepted by Dr. Arbutus Leas, [facility]. Case discussed with Dr. Hyacinth Meeker who also evaluated patient and agrees with plan of care.        Trevor Mace, PA-C 09/16/12 1902

## 2012-09-16 NOTE — ED Notes (Signed)
MD at bedside. 

## 2012-09-17 ENCOUNTER — Inpatient Hospital Stay (HOSPITAL_COMMUNITY): Payer: Medicare Other

## 2012-09-17 DIAGNOSIS — R0989 Other specified symptoms and signs involving the circulatory and respiratory systems: Secondary | ICD-10-CM

## 2012-09-17 DIAGNOSIS — J441 Chronic obstructive pulmonary disease with (acute) exacerbation: Secondary | ICD-10-CM

## 2012-09-17 DIAGNOSIS — J180 Bronchopneumonia, unspecified organism: Secondary | ICD-10-CM

## 2012-09-17 LAB — BASIC METABOLIC PANEL
BUN: 17 mg/dL (ref 6–23)
Chloride: 102 mEq/L (ref 96–112)
GFR calc Af Amer: >90 mL/min (ref >90)
GFR calc non Af Amer: 79 mL/min — ABNORMAL LOW (ref >90)
Potassium: 3.9 mEq/L (ref 3.5–5.1)

## 2012-09-17 LAB — CBC
HCT: 36.2 % (ref 36.0–46.0)
Hemoglobin: 12.4 g/dL (ref 12.0–15.0)
MCHC: 34.3 g/dL (ref 30.0–36.0)
RDW: 13.1 % (ref 11.5–15.5)
WBC: 17.3 10*3/uL — ABNORMAL HIGH (ref 4.0–10.5)

## 2012-09-17 LAB — TROPONIN I
Troponin I: <0.3 ng/mL (ref <0.30)
Troponin I: <0.3 ng/mL (ref <0.30)

## 2012-09-17 LAB — STREP PNEUMONIAE URINARY ANTIGEN: Strep Pneumo Urinary Antigen: NEGATIVE

## 2012-09-17 MED ORDER — ALBUTEROL SULFATE (5 MG/ML) 0.5% IN NEBU: 2.5000 mg | INHALATION_SOLUTION | RESPIRATORY_TRACT | Status: DC | PRN

## 2012-09-17 MED ORDER — GUAIFENESIN ER 600 MG PO TB12
600.0000 mg | ORAL_TABLET | Freq: Two times a day (BID) | ORAL | Status: DC
  Administered 2012-09-17 – 2012-09-20 (×7): 600 mg via ORAL
  Filled 2012-09-17 (×8): qty 1

## 2012-09-17 MED ORDER — METHYLPREDNISOLONE SODIUM SUCC 40 MG IJ SOLR
40.0000 mg | Freq: Three times a day (TID) | INTRAMUSCULAR | Status: DC
  Administered 2012-09-17 (×2): 40 mg via INTRAVENOUS
  Filled 2012-09-17 (×4): qty 1

## 2012-09-17 MED ORDER — IPRATROPIUM BROMIDE 0.02 % IN SOLN: 0.5000 mg | RESPIRATORY_TRACT | Status: DC | PRN

## 2012-09-17 MED ORDER — BUDESONIDE 0.5 MG/2ML IN SUSP
0.5000 mg | Freq: Two times a day (BID) | RESPIRATORY_TRACT | Status: DC
  Administered 2012-09-17 – 2012-09-20 (×6): 0.5 mg via RESPIRATORY_TRACT
  Filled 2012-09-17 (×11): qty 2

## 2012-09-17 MED ORDER — METHYLPREDNISOLONE SODIUM SUCC 40 MG IJ SOLR
40.0000 mg | Freq: Two times a day (BID) | INTRAMUSCULAR | Status: DC
  Administered 2012-09-18 – 2012-09-19 (×3): 40 mg via INTRAVENOUS
  Filled 2012-09-17 (×5): qty 1

## 2012-09-17 MED ORDER — ARFORMOTEROL TARTRATE 15 MCG/2ML IN NEBU
15.0000 ug | INHALATION_SOLUTION | Freq: Two times a day (BID) | RESPIRATORY_TRACT | Status: DC
  Administered 2012-09-17 – 2012-09-20 (×7): 15 ug via RESPIRATORY_TRACT
  Filled 2012-09-17 (×10): qty 2

## 2012-09-17 MED ORDER — ADULT MULTIVITAMIN W/MINERALS CH
1.0000 | ORAL_TABLET | Freq: Every day | ORAL | Status: DC
  Administered 2012-09-17 – 2012-09-20 (×4): 1 via ORAL
  Filled 2012-09-17 (×4): qty 1

## 2012-09-17 NOTE — ED Provider Notes (Signed)
Medical screening examination/treatment/procedure(s) were conducted as a shared visit with non-physician practitioner(s) and myself.  I personally evaluated the patient during the encounter  Please see my separate respective documentation pertaining to this patient encounter   Vida Roller, MD 09/17/12 1525

## 2012-09-17 NOTE — Progress Notes (Signed)
INITIAL NUTRITION ASSESSMENT  DOCUMENTATION CODES Per approved criteria  -Underweight   INTERVENTION: Recommend advancing diet to regular Provide Multivitamin with minerals daily Encourage PO intake  NUTRITION DIAGNOSIS: Inadequate oral intake loss related to decreased appetite/hospitalization as evidenced by 4% wt loss in 1 month and BMI of 17.62.   Goal: Pt to meet >/= 90% of their estimated nutrition needs  Monitor:  PO intake Weight Labs  Reason for Assessment: Low BMI  79 y.o. female  Admitting Dx: Sepsis, CAP  ASSESSMENT: 79 year old female with a history of COPD, chronic systolic CHF, CAD, diverticulosis, bladder tumors, andLBBB presents with a two-day history of feeling "tired and shortness of breath.  Pt reports that she has had a poor appetite since coming to the hospital but, was eating well PTA. Pt states that she usually weighs 104 lbs. Pt ate 75% of breakfast and plans on ordering lunch soon; appetite is improving. Encouraged pt to consume >50% of 3 meals daily.  Height: Ht Readings from Last 1 Encounters:  09/16/12 5\' 3"  (1.6 m)    Weight: Wt Readings from Last 1 Encounters:  09/16/12 99 lb 6.8 oz (45.1 kg)    Ideal Body Weight: 115 lbs  % Ideal Body Weight: 86%  Wt Readings from Last 10 Encounters:  09/16/12 99 lb 6.8 oz (45.1 kg)  08/20/12 103 lb 5 oz (46.862 kg)  08/20/12 103 lb 5 oz (46.862 kg)  07/29/12 105 lb (47.628 kg)  07/22/12 101 lb (45.813 kg)  06/17/12 107 lb 3.2 oz (48.626 kg)  06/14/12 105 lb (47.628 kg)  06/05/12 103 lb 8 oz (46.947 kg)  03/31/12 109 lb (49.442 kg)  12/17/11 99 lb 3.2 oz (44.997 kg)    Usual Body Weight: 104 lbs  % Usual Body Weight: 95%  BMI:  Body mass index is 17.62 kg/(m^2).  Estimated Nutritional Needs: Kcal: 1350-1580 Protein: 54-63 grams Fluid: 1.3 L  Skin: WDL  Diet Order: Cardiac  EDUCATION NEEDS: -No education needs identified at this time   Intake/Output Summary (Last 24 hours) at  09/17/12 1049 Last data filed at 09/17/12 1000  Gross per 24 hour  Intake    270 ml  Output    825 ml  Net   -555 ml    Last BM: PTA  Labs:   Recent Labs Lab 09/16/12 1715 09/16/12 2105 09/17/12 0510  NA 138  --  136  K 3.9  --  3.9  CL 103  --  102  CO2 24  --  25  BUN 18  --  17  CREATININE 0.68 0.75 0.76  CALCIUM 9.4  --  9.2  GLUCOSE 116*  --  129*    CBG (last 3)  No results found for this basename: GLUCAP,  in the last 72 hours  Scheduled Meds: . ipratropium  0.5 mg Nebulization Q4H   And  . albuterol  2.5 mg Nebulization Q4H  . budesonide-formoterol  2 puff Inhalation BID  . calcium carbonate  2.5 tablet Oral Q breakfast  . cholecalciferol  1,000 Units Oral Daily  . enoxaparin (LOVENOX) injection  40 mg Subcutaneous Q24H  . guaiFENesin  600 mg Oral BID  . levofloxacin (LEVAQUIN) IV  750 mg Intravenous Q24H  . losartan  50 mg Oral Daily  . methylPREDNISolone (SOLU-MEDROL) injection  40 mg Intravenous Q8H  . pravastatin  40 mg Oral QPM  . traZODone  50 mg Oral QHS  . vitamin C  1,000 mg Oral Daily  . vitamin E  400 Units Oral Daily    Continuous Infusions:   Past Medical History  Diagnosis Date  . Carotid bruit     Right  . Diverticulosis of colon   . Hyperlipidemia   . History of nephrolithiasis   . Osteopenia   . COPD, moderate   . Cough   . Skipped beats   . Arthritis HANDS  . CAD (coronary artery disease) 09/12/2011    minimal nonobstructive CAD per cath with EF of 35 to 40% and normal filling pressures and right heart pressures  . LV dysfunction   . Chronic systolic CHF (congestive heart failure)   . 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 HOH (hard of hearing)     bil hearing aids    Past Surgical History  Procedure Laterality Date  . Cosmetic surgery      eyelids  . Left long finger arthroplasty & pulley relase  02-17-2005  . Cysto/ left retrograde pyelogram/ left ureteral stent placement  10-16-2001    STONE  . Pulmonary function analysis   07-31-2009    MODERATE TO SEVERE OBSTRUCTIVE AIRWAY DISEASE/ INSIGNIFICANT RESPONSE TO BRONCHODILATORS/ EMPHYSEMA PATTERN/ MILD DECREASE IN DIFFUSE CAPACITY FEF 25-75 CHANGED BY 8%  . Tubal ligation  AGE 25'S  . Tonsillectomy  CHILD  . Extracorporeal shock wave lithotripsy  2003  . Cataract extraction w/ intraocular lens  implant, bilateral  1998  . Transurethral resection of bladder tumor  07/01/2011    Procedure: TRANSURETHRAL RESECTION OF BLADDER TUMOR (TURBT);  Surgeon: Lindaann Slough, MD;  Location: [facility];  Service: Urology;  Laterality: N/A;  CYSTO  . Cystoscopy  07/01/2011    Procedure: CYSTOSCOPY;  Surgeon: Lindaann Slough, MD;  Location: [facility];  Service: Urology;  Laterality: N/A;  retrieval l of bladder stone  . Orif finger / thumb fracture    . Cystoscopy N/A 08/20/2012    Procedure: CYSTOSCOPY;  Surgeon: Lindaann Slough, MD;  Location: [facility];  Service: Urology;  Laterality: N/A;  fulgeration of bladderm tumors    Ian Malkin RD, LDN Inpatient Clinical Dietitian Pager: [phone] After Hours Pager: [phone]

## 2012-09-17 NOTE — Progress Notes (Signed)
[facility] PROGRESS NOTE  Amber Snow:[id number] DOB: [birth year] DOA: 09/16/2012 PCP: Marga Melnick, MD  Assessment/Plan: Active Problems:   VITAMIN D DEFICIENCY   HYPERLIPIDEMIA   LEFT BUNDLE BRANCH BLOCK   COPD GOLD II   Nonischemic cardiomyopathy   Sepsis   CAP (community acquired pneumonia)    1. Community acquired pneumonia:  Patient presented with weakness, a dry cough and progressive SOB. She was found to have WBC 18.2, fever, tachycardia and CXR demonstrated moderate changes of acute bronchitis and/or asthma with probable bronchopneumonia involving the left lower lobe. Managing with iv Levaquin, as well as supportive treatment. Urine Legionella antigen is pending, while urine Streptococcus pneumoniae antigen is negative. Patient has remained apyrexial overnight.  2. COPD Exacerbation: Patient has known COPD GOLD-2, with FEV1 of 63% on PFTs of 07/29/2012. Exacerbated by #1 above. Managing with steroids, bronchodilators and oxygen, as well as antibiotics.  3. Acute hypoxic respiratory failure: At presentation, patient was hypoxemic with oxygen saturation of 85% on room air which improved to 95% on 2 L. This is secondary to #s 1&2 above. Managing as described.  4. Nonischemic cardiomyopathy: Patient has known cardiomyopathy with diastolic dysfunction and chronic LBBB. EF 35-40% , grade 1 diastolic dysfunction. Cardiac catheterization on 09/12/2011 revealed minimal nonobstructive coronary disease. Clinically, patient has no evidence of decompensation, and volume status is satisfactory. Although ProBNP is elevated at 1065, this is likely due to strain, imposed by acute pulmonary problems.   5. Hyperlipidemia: On statin. 6. Lung nodule: Patient has a documented RUL nodule, noted on chest x-ray 07/29/2012. She has already been evaluated by Dr Sherene Sires, pulmonologist in the office, and the plan is for conservative f/u and proceed to PET/ excisional bx if shows growth at 6 months  from discovery.    Code Status: Full Code.  Family Communication:  Disposition Plan: To be determined.    Brief narrative: 79 year old female with a history of COPD, chronic systolic CHF, CAD, diverticulosis, bladder tumors, and LBBB, discharged from [facility] on 06/05/2012 after admission for COPD exacerbation.presenting with a two-day history of feeling "tired", shortness of breath, and intermittent chest discomfort, associated with worsening dry cough without hemoptysis. Around 11:00 o'clock on the morning before admission, the patient noted some chills and took her temperature, and it was noted to be 102.21F at home. Her symptoms continued to progress and did not remit with use of her rescue inhalers. As a result she came to the emergency department for further evaluation. In the emergency department, the patient was given aerosolized Albuterol and Atrovent as well as Prednisone 60 mg and was started on Ceftriaxone and Azithromycin. Initially, the patient was hypoxemic with oxygen saturation of 85% on room air which improved to 95% on 2 L. Temperature 100.25F, WBC 18.2. BMP was unremarkable. Urinalysis negative for pyuria. Chest x-ray showed left lower lobe opacity and slight increase in right lower lobe opacity. Admitted for further management.    Consultants:  N/A.   Procedures:  CXR.   Antibiotics:  Azithromycin 09/16/12.   Levaquin 09/16/12>>>  Rocephin 09/16/12.   HPI/Subjective: Feels marginally better.   Objective: Vital signs in last 24 hours: Temp:  [98.2 F (36.8 C)-100.4 F (38 C)] 98.2 F (36.8 C) (05/23 0500) Pulse Rate:  [86-126] 86 (05/23 0500) Resp:  [22-30] 22 (05/23 0500) BP: (100-147)/(52-68) 100/52 mmHg (05/23 0500) SpO2:  [85 %-96 %] 96 % (05/22 2027) Weight:  [45.1 kg (99 lb 6.8 oz)] 45.1 kg (99 lb 6.8 oz) (05/22 2027) Weight change:  Intake/Output from previous day: 05/22 0701 - 05/23 0700 In: 150 [IV Piggyback:150] Out: 350 [Urine:350]      Physical Exam: General: Comfortable, alert, communicative, fully oriented, not short of breath at rest.  HEENT:  No clinical pallor, no jaundice, no conjunctival injection or discharge. Hydration is fair.  NECK:  Supple, JVP not seen, no carotid bruits, no palpable lymphadenopathy, no palpable goiter. CHEST:  Rhonchi/crackles left base.  HEART:  Sounds 1 and 2 heard, normal, regular, no murmurs. ABDOMEN:  Full, soft, non-tender, no palpable organomegaly, no palpable masses, normal bowel sounds. GENITALIA:  Not examined. LOWER EXTREMITIES:  No pitting edema, palpable peripheral pulses. MUSCULOSKELETAL SYSTEM:  Generalized osteoarthritic changes, otherwise, normal. CENTRAL NERVOUS SYSTEM:  No focal neurologic deficit on gross examination.  Lab Results:  Recent Labs  09/16/12 2105 09/17/12 0510  WBC 20.0* 17.3*  HGB 12.3 12.4  HCT 36.9 36.2  PLT 205 218    Recent Labs  09/16/12 1715 09/16/12 2105 09/17/12 0510  NA 138  --  136  K 3.9  --  3.9  CL 103  --  102  CO2 24  --  25  GLUCOSE 116*  --  129*  BUN 18  --  17  CREATININE 0.68 0.75 0.76  CALCIUM 9.4  --  9.2   No results found for this or any previous visit (from the past 240 hour(s)).   Studies/Results: Dg Chest 2 View  09/16/2012   *RADIOLOGY REPORT*  Clinical Data: Fever.  Shortness of breath.  Current history of COPD/emphysema.  CHEST - 2 VIEW  Comparison: Two-view chest x-ray 07/29/2012, 06/01/2012, 09/04/2011.  Findings: Suboptimal inspiration.  Prominent bronchovascular markings diffusely and moderate central peribronchial thickening, more so than on the prior examinations. Patchy opacity in the left lower lobe.  Biapical pleuroparenchymal scarring, unchanged.  Lungs otherwise clear.  No convincing pleural effusions.  Mild degenerative changes involving the thoracic spine.  Cardiac silhouette normal in size, unchanged.  Thoracic aorta atherosclerotic, unchanged.  Hilar and mediastinal contours otherwise  unremarkable.  IMPRESSION: Moderate changes of acute bronchitis and/or asthma with probable bronchopneumonia involving the left lower lobe, superimposed upon baseline COPD/emphysema.   Original Report Authenticated By: Hulan Saas, M.D.    Medications: Scheduled Meds: . ipratropium  0.5 mg Nebulization Q4H   And  . albuterol  2.5 mg Nebulization Q4H  . budesonide-formoterol  2 puff Inhalation BID  . calcium carbonate  2.5 tablet Oral Q breakfast  . cholecalciferol  1,000 Units Oral Daily  . enoxaparin (LOVENOX) injection  40 mg Subcutaneous Q24H  . levofloxacin (LEVAQUIN) IV  750 mg Intravenous Q24H  . losartan  50 mg Oral Daily  . pravastatin  40 mg Oral QPM  . traZODone  50 mg Oral QHS  . vitamin C  1,000 mg Oral Daily  . vitamin E  400 Units Oral Daily   Continuous Infusions:  PRN Meds:.acetaminophen, acetaminophen, ondansetron (ZOFRAN) IV, ondansetron    LOS: 1 day   Lian Pounds,CHRISTOPHER  [facility] Pager [phone]. If 8PM-8AM, please contact night-coverage at [email or web address], password [email or web address] 09/17/2012, 7:20 AM  LOS: 1 day

## 2012-09-17 NOTE — Consult Note (Signed)
PULMONARY  / CRITICAL CARE MEDICINE  Name: Amber Snow MRN: [id number] DOB: [birth year]    ADMISSION DATE:  09/16/2012 CONSULTATION DATE:  09/07/12  REFERRING MD :  Brien Few PRIMARY SERVICE:  [facility]  CHIEF COMPLAINT:  Acute COPD Exacerbation r/t probable CAP  BRIEF PATIENT DESCRIPTION: A 79 yowf with hx of COPD admitted on 5/22 from the [facility] ER with dyspnea, fever, and dry cough. Thought to be CAP exacerbating her COPD; therefore, one dose of prednisone given and ABX started by ERP.  SIGNIFICANT EVENTS / STUDIES:  5/22>>Admitted from ER with COPD exacerbation  LINES / TUBES: Peripherals  CULTURES: Blood x2 5/22 >>>  Urine Legionella 5/23 >>> negative Urine Strep Pneumo 5/23 >>>negative  ANTIBIOTICS: Ceftriaxone 5/22 >>> 5/22 (one dose) Azithromycin 5/22 >>>5/22 (one dose) Levaquin 5/22 >>>   HISTORY OF PRESENT ILLNESS: Amber Snow is a63 year old female with a history of COPD, chronic systolic CHF, CAD, diverticulosis, bladder tumors, and LBBB admitted to [facility] via [facility] ER with complaints of dyspnea, fever (102), and non productive cough.  The patient also complained of a two-day history of feeling "tired", intermittent chest discomfort, without hemoptysis.Of note, the patient was discharged from [facility] on 06/05/2012 after admission for COPD exacerbation. In the emergency department, the patient was given aerosolized Albuterol and Atrovent as well as Prednisone 60 mg and was started on Ceftriaxone and Azithromycin. Initially, the patient was hypoxemic with oxygen saturation of 85% on room air which improved to 95% on 2 L. Temperature 100.35F, WBC 18.2. BMP and Urinalysis were both negative. Cardiac enzymes were also collectied and found to be negative. Chest x-ray showed left lower lobe opacity. [facility] admitted the patient and PCCM was called as the patient is known to Dr. Sherene Sires from previous admissions.   PAST MEDICAL HISTORY :  Past Medical History  Diagnosis Date  . Carotid bruit     Right   . Diverticulosis of colon   . Hyperlipidemia   . History of nephrolithiasis   . Osteopenia   . COPD, moderate   . Cough   . Skipped beats   . Arthritis HANDS  . CAD (coronary artery disease) 09/12/2011    minimal nonobstructive CAD per cath with EF of 35 to 40% and normal filling pressures and right heart pressures  . LV dysfunction   . Chronic systolic CHF (congestive heart failure)   . 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 HOH (hard of hearing)     bil hearing aids   Past Surgical History  Procedure Laterality Date  . Cosmetic surgery      eyelids  . Left long finger arthroplasty & pulley relase  02-17-2005  . Cysto/ left retrograde pyelogram/ left ureteral stent placement  10-16-2001    STONE  . Pulmonary function analysis  07-31-2009    MODERATE TO SEVERE OBSTRUCTIVE AIRWAY DISEASE/ INSIGNIFICANT RESPONSE TO BRONCHODILATORS/ EMPHYSEMA PATTERN/ MILD DECREASE IN DIFFUSE CAPACITY FEF 25-75 CHANGED BY 8%  . Tubal ligation  AGE 64'S  . Tonsillectomy  CHILD  . Extracorporeal shock wave lithotripsy  2003  . Cataract extraction w/ intraocular lens  implant, bilateral  1998  . Transurethral resection of bladder tumor  07/01/2011    Procedure: TRANSURETHRAL RESECTION OF BLADDER TUMOR (TURBT);  Surgeon: Lindaann Slough, MD;  Location: [facility];  Service: Urology;  Laterality: N/A;  CYSTO  . Cystoscopy  07/01/2011    Procedure: CYSTOSCOPY;  Surgeon: Lindaann Slough, MD;  Location: [facility];  Service: Urology;  Laterality: N/A;  retrieval l of bladder stone  . Orif finger / thumb fracture    . Cystoscopy N/A 08/20/2012    Procedure: CYSTOSCOPY;  Surgeon: Lindaann Slough, MD;  Location: [facility];  Service: Urology;  Laterality: N/A;  fulgeration of bladderm tumors   Prior to Admission medications   Medication Sig Start Date End Date Taking? Authorizing Provider  albuterol (PROVENTIL HFA;VENTOLIN HFA) 108 (90 BASE) MCG/ACT inhaler Inhale 2 puffs into  the lungs every 6 (six) hours as needed. Shortness of breath   Yes Historical Provider, MD  Ascorbic Acid (VITAMIN C) 1000 MG tablet Take 1,000 mg by mouth daily.    Yes Historical Provider, MD  BIOTIN PO Take 1 capsule by mouth daily.   Yes Historical Provider, MD  budesonide-formoterol (SYMBICORT) 160-4.5 MCG/ACT inhaler Inhale 2 puffs into the lungs 2 (two) times daily. 03/04/12  Yes Pecola Lawless, MD  Calcium Carbonate (CALTRATE 600) 1500 MG TABS Take 2.5 tablets by mouth daily.    Yes Historical Provider, MD  Cholecalciferol (VITAMIN D) 1000 UNITS capsule Take 1,000 Units by mouth daily.    Yes Historical Provider, MD  ibuprofen (ADVIL,MOTRIN) 200 MG tablet Take 600 mg by mouth every 6 (six) hours as needed for pain.   Yes Historical Provider, MD  losartan (COZAAR) 50 MG tablet Take 50 mg by mouth at bedtime.   Yes Historical Provider, MD  Multiple Vitamin (MULITIVITAMIN WITH MINERALS) TABS Take 1 tablet by mouth daily.   Yes Historical Provider, MD  pravastatin (PRAVACHOL) 40 MG tablet Take 1 tablet (40 mg total) by mouth at bedtime. 07/01/12  Yes Pecola Lawless, MD  tiotropium (SPIRIVA) 18 MCG inhalation capsule Place 18 mcg into inhaler and inhale daily.   Yes Historical Provider, MD  traZODone (DESYREL) 50 MG tablet Take 50 mg by mouth at bedtime.   Yes Historical Provider, MD  vitamin E 400 UNIT capsule Take 400 Units by mouth daily.    Yes Historical Provider, MD  zoledronic acid (RECLAST) 5 MG/100ML SOLN Inject 5 mg into the vein once. Every year    Historical Provider, MD   Allergies  Allergen Reactions  . Barbiturates     REACTION: rash  . Penicillins Rash  . Shrimp (Shellfish Allergy) Swelling  . Aspirin     REACTION: nausea  . Beta Adrenergic Blockers     PT STATES TOLD TO AVOID DUE TO COUGH REACTION TO CARDIZEM  . Calcium Channel Blockers     PER PT TOLD TO AVOID DUE TO COUGH REACTION TO CARDIZEM  . Celecoxib     REACTION: nausea  . Codeine     REACTION: excessive  sleep  . Diltiazem Hcl Other (See Comments)    REACTION: cough  . Fluvastatin Sodium     REACTION: intolerance  . Ibandronate Sodium Other (See Comments)    REACTION: HAIR LOSS/NAIL CHANGES  . Rosuvastatin     REACTION: intolerance  . Simvastatin     REACTION: intolerance    FAMILY HISTORY:  Family History  Problem Relation Age of Onset  . Heart disease Mother   . Hypertension Mother   . Hypertension Father   . Heart attack Father   . Heart disease Father   . Thyroid disease Sister   . Heart attack Sister   . Stroke Sister   . COPD Sister   . Heart attack Brother   . Cancer Maternal Grandmother     breast cancer  . Diabetes Paternal Grandmother   .  Asthma Neg Hx    SOCIAL HISTORY:  reports that she quit smoking about a year ago. Her smoking use included Cigarettes. She has a 50 pack-year smoking history. She has never used smokeless tobacco. She reports that she does not drink alcohol or use illicit drugs.  REVIEW OF SYSTEMS:   Constitutional: Negative for fever, chills, weight loss, malaise/fatigue and diaphoresis.  HENT: Negative for hearing loss, ear pain, nosebleeds, congestion, sore throat, neck pain, tinnitus and ear discharge.   Eyes: Negative for blurred vision, double vision, photophobia, pain, discharge and redness.  Respiratory: Positive for cough and dyspnea. Negative for hemoptysis, sputum production, wheezing and stridor.   Cardiovascular: Positive for chest discomfort. Negative for palpitations, orthopnea, claudication, leg swelling and PND.  Gastrointestinal: Negative for heartburn, nausea, vomiting, abdominal pain, diarrhea, constipation, blood in stool and melena.  Genitourinary: Negative for dysuria, urgency, frequency, hematuria and flank pain.  Musculoskeletal: Negative for myalgias, back pain, joint pain and falls.  Skin: Negative for itching and rash.  Neurological: Negative for dizziness, tingling, tremors, sensory change, speech change, focal  weakness, seizures, loss of consciousness, weakness and headaches.  Endo/Heme/Allergies: Negative for environmental allergies and polydipsia. Does not bruise/bleed easily.  SUBJECTIVE: Still some shortness of breath, although improved. Dry cough.  VITAL SIGNS: Temp:  [98.1 F (36.7 C)-100.4 F (38 C)] 98.1 F (36.7 C) (05/23 1343) Pulse Rate:  [86-126] 91 (05/23 1343) Resp:  [20-30] 20 (05/23 1343) BP: (100-147)/(52-80) 104/80 mmHg (05/23 1343) SpO2:  [85 %-96 %] 96 % (05/23 1417) Weight:  [99 lb 6.8 oz (45.1 kg)] 99 lb 6.8 oz (45.1 kg) (05/22 2027) 2 liter PHYSICAL EXAMINATION: General:  Elderly appearing female with increased WOB. Neuro: AxO x4, moves all ext. Strength intact.  HEENT:  PERRLA, throat clear  Neck:  neck supple, no mases, lumps Cardiovascular:  Regular rate and rhythm, no wheezes or rubs Lungs:  Clear but diminished, faint expiratory wheezes noted  Abdomen: Soft, flat, and nontender, active bowel sounds. Musculoskeletal:  Moves all ext. Skin:  intact   Recent Labs Lab 09/16/12 1715 09/16/12 2105 09/17/12 0510  NA 138  --  136  K 3.9  --  3.9  CL 103  --  102  CO2 24  --  25  BUN 18  --  17  CREATININE 0.68 0.75 0.76  GLUCOSE 116*  --  129*    Recent Labs Lab 09/16/12 1715 09/16/12 2105 09/17/12 0510  HGB 12.8 12.3 12.4  HCT 37.2 36.9 36.2  WBC 18.2* 20.0* 17.3*  PLT 236 205 218   Dg Chest 2 View  09/16/2012   *RADIOLOGY REPORT*  Clinical Data: Fever.  Shortness of breath.  Current history of COPD/emphysema.  CHEST - 2 VIEW  Comparison: Two-view chest x-ray 07/29/2012, 06/01/2012, 09/04/2011.  Findings: Suboptimal inspiration.  Prominent bronchovascular markings diffusely and moderate central peribronchial thickening, more so than on the prior examinations. Patchy opacity in the left lower lobe.  Biapical pleuroparenchymal scarring, unchanged.  Lungs otherwise clear.  No convincing pleural effusions.  Mild degenerative changes involving the  thoracic spine.  Cardiac silhouette normal in size, unchanged.  Thoracic aorta atherosclerotic, unchanged.  Hilar and mediastinal contours otherwise unremarkable.  IMPRESSION: Moderate changes of acute bronchitis and/or asthma with probable bronchopneumonia involving the left lower lobe, superimposed upon baseline COPD/emphysema.   Original Report Authenticated By: Hulan Saas, M.D.   Dg Chest Port 1 View  09/17/2012   *RADIOLOGY REPORT*  Clinical Data: Acute exacerbation of COPD.  Evaluate for pneumonia.  PORTABLE CHEST - 1 VIEW  Comparison: 09/16/2012  Findings: Changes of underlying COPD are identified with hyperinflation and underlying bronchitic change.  Slight increase in markings noted at the left lung base appear unchanged in comparison with the prior exam, again suspicious for a small focus of bronchopneumonia at the left base. An overall increase in interstitial markings is identified and also unchanged. No new focal infiltrates or signs of congestive failure are identified. No pleural fluid is seen.  Heart and mediastinal contours are stable with aortic calcification and mild prominence of the pulmonary arterial segments.  Bilateral apical pleural thickening is noted with the dense nodule  medially in the right apex unchanged.  IMPRESSION: Unchanged left lower lobe infiltrate, interstitial prominence and area of focal apical nodularity.   Original Report Authenticated By: Rhodia Albright, M.D.    ASSESSMENT / PLAN:  Acute respiratory failure 2nd to acute COPD exacerbation with Lt lower lobe infiltrate. FEV1 63% 07/29/12. Plan: -wean off solumedrol as tolerated  -transition to nebulized medications while acutely ill (pulmicort/brovana) >> transition back to inhalers (spiriva/symbicort) when she has clinical improvement -adjust oxygen to keep SpO2 > 92% -f/u CXR intermittently  Hx of RUL pulmonary nodule - previously noted on chest CT from 06/01/12. Followed by Dr. Sherene Sires. Plan: -Will follow  as outpatient.  Chronic systolic/diastolic CHF, CAD. EF 35 to 40% with grade 1 diastolic dysfx from Echo 08/28/11. Plan: -goal even fluid balance  Reviewed above, examined pt, and agree with assessment/plan.  Coralyn Helling, MD [facility] Pulmonary/Critical Care 09/17/2012, 4:35 PM Pager:  [phone] After 3pm call: [phone]

## 2012-09-18 DIAGNOSIS — R911 Solitary pulmonary nodule: Secondary | ICD-10-CM

## 2012-09-18 DIAGNOSIS — J189 Pneumonia, unspecified organism: Secondary | ICD-10-CM

## 2012-09-18 DIAGNOSIS — J449 Chronic obstructive pulmonary disease, unspecified: Secondary | ICD-10-CM

## 2012-09-18 LAB — BASIC METABOLIC PANEL
CO2: 23 mEq/L (ref 19–32)
Chloride: 102 mEq/L (ref 96–112)
Glucose, Bld: 131 mg/dL — ABNORMAL HIGH (ref 70–99)
Sodium: 137 mEq/L (ref 135–145)

## 2012-09-18 LAB — CBC
Hemoglobin: 12.1 g/dL (ref 12.0–15.0)
MCH: 30 pg (ref 26.0–34.0)
MCV: 92.1 fL (ref 78.0–100.0)
Platelets: 227 10*3/uL (ref 150–400)
RBC: 4.03 MIL/uL (ref 3.87–5.11)
WBC: 17 10*3/uL — ABNORMAL HIGH (ref 4.0–10.5)

## 2012-09-18 MED ORDER — LEVOFLOXACIN 750 MG PO TABS
750.0000 mg | ORAL_TABLET | ORAL | Status: DC
  Filled 2012-09-18: qty 1

## 2012-09-18 MED ORDER — POLYETHYLENE GLYCOL 3350 17 G PO PACK
17.0000 g | PACK | Freq: Every day | ORAL | Status: DC
  Administered 2012-09-18 – 2012-09-20 (×3): 17 g via ORAL
  Filled 2012-09-18 (×3): qty 1

## 2012-09-18 NOTE — Progress Notes (Signed)
[facility] PROGRESS NOTE  Amber Snow HYQ:[id number] DOB: 10-06-1932 DOA: 09/16/2012 PCP: Marga Melnick, MD  Assessment/Plan: Active Problems:   VITAMIN D DEFICIENCY   HYPERLIPIDEMIA   LEFT BUNDLE BRANCH BLOCK   COPD GOLD II   Nonischemic cardiomyopathy   Sepsis   CAP (community acquired pneumonia)    1. Community acquired pneumonia:  Patient presented with weakness, a dry cough and progressive SOB. She was found to have WBC 18.2, fever, tachycardia and CXR demonstrated moderate changes of acute bronchitis and/or asthma with probable bronchopneumonia involving the left lower lobe. Managing with iv Levaquin now day# 3 , as well as supportive treatment. Urine Legionella and Streptococcus pneumoniae antigens are negative. Patient has remained apyrexial since hospitalization, and is feeling better. Cough remains non-productive. WCC is trending down. 2. COPD Exacerbation: Patient has known COPD GOLD-2, with FEV1 of 63% on PFTs of 07/29/2012. Exacerbated by #1 above. Managing with steroids, bronchodilators and oxygen, as well as antibiotics, with gradual clinical improvement. Dr Coralyn Helling provided pulmonary consultation, and we are managing as recommended.  3. Acute hypoxic respiratory failure: At presentation, patient was hypoxemic with oxygen saturation of 85% on room air which improved to 95% on 2 L. This is secondary to #s 1&2 above. Managing as described. Today, patient is saturating at 96%-97% on RA.  4. Nonischemic cardiomyopathy: Patient has known cardiomyopathy with diastolic dysfunction and chronic LBBB. EF 35-40% , grade 1 diastolic dysfunction. Cardiac catheterization on 09/12/2011 revealed minimal nonobstructive coronary disease. Clinically, patient has no evidence of decompensation, and volume status is satisfactory. Although ProBNP is elevated at 1065, this is likely due to strain, imposed by acute pulmonary problems. Monitoring volume status closely.  5. Hyperlipidemia:  On statin. 6. Lung nodule: Patient has a documented RUL nodule, noted on chest x-ray 07/29/2012. She has already been evaluated by Dr Sherene Sires, pulmonologist in the office, and the plan is for conservative f/u and proceed to PET/Excisional bx if shows growth at 6 months from discovery.    Code Status: Full Code.  Family Communication:  Disposition Plan: To be determined. Aiming discharge on 09/20/12 or 09/21/12.    Brief narrative: 79 year old female with a history of COPD, chronic systolic CHF, CAD, diverticulosis, bladder tumors, and LBBB, discharged from [facility] on 06/05/2012 after admission for COPD exacerbation.presenting with a two-day history of feeling "tired", shortness of breath, and intermittent chest discomfort, associated with worsening dry cough without hemoptysis. Around 11:00 o'clock on the morning before admission, the patient noted some chills and took her temperature, and it was noted to be 102.47F at home. Her symptoms continued to progress and did not remit with use of her rescue inhalers. As a result she came to the emergency department for further evaluation. In the emergency department, the patient was given aerosolized Albuterol and Atrovent as well as Prednisone 60 mg and was started on Ceftriaxone and Azithromycin. Initially, the patient was hypoxemic with oxygen saturation of 85% on room air which improved to 95% on 2 L. Temperature 100.36F, WBC 18.2. BMP was unremarkable. Urinalysis negative for pyuria. Chest x-ray showed left lower lobe opacity and slight increase in right lower lobe opacity. Admitted for further management.    Consultants:  N/A.   Procedures:  CXR.   Antibiotics:  Azithromycin 09/16/12.   Levaquin 09/16/12>>>  Rocephin 09/16/12.   HPI/Subjective: Feels better. Cough is still non-productive and patient is constipated.    Objective: Vital signs in last 24 hours: Temp:  [98.1 F (36.7 C)-98.4 F (36.9 C)] 98.3  F (36.8 C) (05/24 0517) Pulse Rate:   [86-91] 86 (05/24 0517) Resp:  [20-22] 20 (05/24 0517) BP: (104-135)/(56-80) 134/62 mmHg (05/24 0517) SpO2:  [95 %-98 %] 97 % (05/24 0920) Weight:  [45.541 kg (100 lb 6.4 oz)] 45.541 kg (100 lb 6.4 oz) (05/24 0500) Weight change: 0.441 kg (15.6 oz) Last BM Date: 09/16/12  Intake/Output from previous day: 05/23 0701 - 05/24 0700 In: 600 [P.O.:600] Out: 2125 [Urine:2125]     Physical Exam: General: Comfortable, alert, communicative, fully oriented, not short of breath at rest.  HEENT:  No clinical pallor, no jaundice, no conjunctival injection or discharge. Hydration is fair.  NECK:  Supple, JVP not seen, no carotid bruits, no palpable lymphadenopathy, no palpable goiter. CHEST:  Rhonchi/crackles left base.  HEART:  Sounds 1 and 2 heard, normal, regular, no murmurs. ABDOMEN:  Full, soft, non-tender, no palpable organomegaly, no palpable masses, normal bowel sounds. GENITALIA:  Not examined. LOWER EXTREMITIES:  No pitting edema, palpable peripheral pulses. MUSCULOSKELETAL SYSTEM:  Generalized osteoarthritic changes, otherwise, normal. CENTRAL NERVOUS SYSTEM:  No focal neurologic deficit on gross examination.  Lab Results:  Recent Labs  09/17/12 0510 09/18/12 0520  WBC 17.3* 17.0*  HGB 12.4 12.1  HCT 36.2 37.1  PLT 218 227    Recent Labs  09/17/12 0510 09/18/12 0520  NA 136 137  K 3.9 4.6  CL 102 102  CO2 25 23  GLUCOSE 129* 131*  BUN 17 23  CREATININE 0.76 0.76  CALCIUM 9.2 9.4   No results found for this or any previous visit (from the past 240 hour(s)).   Studies/Results: Dg Chest 2 View  09/16/2012   *RADIOLOGY REPORT*  Clinical Data: Fever.  Shortness of breath.  Current history of COPD/emphysema.  CHEST - 2 VIEW  Comparison: Two-view chest x-ray 07/29/2012, 06/01/2012, 09/04/2011.  Findings: Suboptimal inspiration.  Prominent bronchovascular markings diffusely and moderate central peribronchial thickening, more so than on the prior examinations. Patchy  opacity in the left lower lobe.  Biapical pleuroparenchymal scarring, unchanged.  Lungs otherwise clear.  No convincing pleural effusions.  Mild degenerative changes involving the thoracic spine.  Cardiac silhouette normal in size, unchanged.  Thoracic aorta atherosclerotic, unchanged.  Hilar and mediastinal contours otherwise unremarkable.  IMPRESSION: Moderate changes of acute bronchitis and/or asthma with probable bronchopneumonia involving the left lower lobe, superimposed upon baseline COPD/emphysema.   Original Report Authenticated By: Hulan Saas, M.D.   Dg Chest Port 1 View  09/17/2012   *RADIOLOGY REPORT*  Clinical Data: Acute exacerbation of COPD.  Evaluate for pneumonia.  PORTABLE CHEST - 1 VIEW  Comparison: 09/16/2012  Findings: Changes of underlying COPD are identified with hyperinflation and underlying bronchitic change.  Slight increase in markings noted at the left lung base appear unchanged in comparison with the prior exam, again suspicious for a small focus of bronchopneumonia at the left base. An overall increase in interstitial markings is identified and also unchanged. No new focal infiltrates or signs of congestive failure are identified. No pleural fluid is seen.  Heart and mediastinal contours are stable with aortic calcification and mild prominence of the pulmonary arterial segments.  Bilateral apical pleural thickening is noted with the dense nodule  medially in the right apex unchanged.  IMPRESSION: Unchanged left lower lobe infiltrate, interstitial prominence and area of focal apical nodularity.   Original Report Authenticated By: Rhodia Albright, M.D.    Medications: Scheduled Meds: . arformoterol  15 mcg Nebulization BID  . budesonide (PULMICORT) nebulizer solution  0.5 mg  Nebulization BID  . calcium carbonate  2.5 tablet Oral Q breakfast  . cholecalciferol  1,000 Units Oral Daily  . enoxaparin (LOVENOX) injection  40 mg Subcutaneous Q24H  . guaiFENesin  600 mg Oral  BID  . levofloxacin (LEVAQUIN) IV  750 mg Intravenous Q24H  . losartan  50 mg Oral Daily  . methylPREDNISolone (SOLU-MEDROL) injection  40 mg Intravenous Q12H  . multivitamin with minerals  1 tablet Oral Daily  . pravastatin  40 mg Oral QPM  . traZODone  50 mg Oral QHS  . vitamin C  1,000 mg Oral Daily  . vitamin E  400 Units Oral Daily   Continuous Infusions:  PRN Meds:.acetaminophen, acetaminophen, albuterol, ipratropium, ondansetron (ZOFRAN) IV, ondansetron    LOS: 2 days   Jurgen Groeneveld,CHRISTOPHER  [facility] Pager [phone]. If 8PM-8AM, please contact night-coverage at [email or web address], password [facility] 09/18/2012, 9:23 AM  LOS: 2 days

## 2012-09-18 NOTE — Progress Notes (Signed)
Received report from Silvestre Gunner, RN and took over care of this patient. Patient denies c/o's, up to bathroom without any difficulty. Continue to monitor. Ginny Forth

## 2012-09-18 NOTE — Progress Notes (Signed)
Name: Amber Snow  MRN: [id number]  DOB: 03-01-1933  ADMISSION DATE: 09/16/2012  CONSULTATION DATE: 09/07/12  REFERRING MD : Brien Snow  PRIMARY SERVICE: [facility]  CHIEF COMPLAINT: Acute COPD Exacerbation r/t probable CAP  BRIEF PATIENT DESCRIPTION: A 79 yowf with hx of COPD admitted on 5/22 from the [facility] ER with dyspnea, fever, and dry cough. Thought to be CAP exacerbating her COPD; therefore, one dose of prednisone given and ABX started by ERP.  SIGNIFICANT EVENTS / STUDIES:  5/22>>Admitted from ER with COPD exacerbation  LINES / TUBES:  Peripherals  CULTURES:  Blood x2 5/22 >>>  Urine Legionella 5/23 >>> negative  Urine Strep Pneumo 5/23 >>>negative  ANTIBIOTICS:  Ceftriaxone 5/22 >>> 5/22 (one dose)  Azithromycin 5/22 >>>5/22 (one dose)  Levaquin 5/22 >>>   HISTORY OF PRESENT ILLNESS: Amber Snow is a24 year old female with a history of COPD, chronic systolic CHF, CAD, diverticulosis, bladder tumors, and LBBB admitted to [facility] via [facility] ER with complaints of dyspnea, fever (102), and non productive cough. The patient also complained of a two-day history of feeling "tired", intermittent chest discomfort, without hemoptysis.Of note, the patient was discharged from [facility] on 06/05/2012 after admission for COPD exacerbation. In the emergency department, the patient was given aerosolized Albuterol and Atrovent as well as Prednisone 60 mg and was started on Ceftriaxone and Azithromycin. Initially, the patient was hypoxemic with oxygen saturation of 85% on room air which improved to 95% on 2 L. Temperature 100.55F, WBC 18.2. BMP and Urinalysis were both negative. Cardiac enzymes were also collectied and found to be negative. Chest x-ray showed left lower lobe opacity. [facility] admitted the patient and PCCM was called as the patient is known to Amber Snow from previous admissions.    SUBJECTIVE:  Much improved.  No increased wob.  Ambulating in room without difficulty.  Cough decreased, no mucus.    VITAL SIGNS:   Temp: [98.1 F (36.7 C)-100.4 F (38 C)] 98.1 F (36.7 C) (05/23 1343)  Pulse Rate: [86-126] 91 (05/23 1343)  Resp: [20-30] 20 (05/23 1343)  BP: (100-147)/(52-80) 104/80 mmHg (05/23 1343)  SpO2: [85 %-96 %] 96 % (05/23 1417)  Weight: [99 lb 6.8 oz (45.1 kg)] 99 lb 6.8 oz (45.1 kg) (05/22 2027)  2 liter    PHYSICAL EXAMINATION:  General: wd female in nad and no increased wob  Neuro: AxO x4, moves all ext.  HEENT: nose without purulence or discharge Neck: neck supple, no LN or TMG Cardiovascular: Regular rate and rhythm Lungs: Clear but diminished, no true wheezing, mild upper airway pseudowheezing.  Abdomen: Soft, flat, and nontender, active bowel sounds.  Musculoskeletal: Moves all ext.    Recent Labs  Lab  09/16/12 1715  09/16/12 2105  09/17/12 0510   NA  138  --  136   K  3.9  --  3.9   CL  103  --  102   CO2  24  --  25   BUN  18  --  17   CREATININE  0.68  0.75  0.76   GLUCOSE  116*  --  129*     Recent Labs  Lab  09/16/12 1715  09/16/12 2105  09/17/12 0510   HGB  12.8  12.3  12.4   HCT  37.2  36.9  36.2   WBC  18.2*  20.0*  17.3*   PLT  236  205  218    Dg Chest 2 View  09/16/2012 *RADIOLOGY REPORT* Clinical Data: Fever. Shortness  of breath. Current history of COPD/emphysema. CHEST - 2 VIEW Comparison: Two-view chest x-ray 07/29/2012, 06/01/2012, 09/04/2011. Findings: Suboptimal inspiration. Prominent bronchovascular markings diffusely and moderate central peribronchial thickening, more so than on the prior examinations. Patchy opacity in the left lower lobe. Biapical pleuroparenchymal scarring, unchanged. Lungs otherwise clear. No convincing pleural effusions. Mild degenerative changes involving the thoracic spine. Cardiac silhouette normal in size, unchanged. Thoracic aorta atherosclerotic, unchanged. Hilar and mediastinal contours otherwise unremarkable. IMPRESSION: Moderate changes of acute bronchitis and/or asthma with probable bronchopneumonia involving the  left lower lobe, superimposed upon baseline COPD/emphysema. Original Report Authenticated By: Amber Snow, M.D.  Dg Chest Port 1 View  09/17/2012 *RADIOLOGY REPORT* Clinical Data: Acute exacerbation of COPD. Evaluate for pneumonia. PORTABLE CHEST - 1 VIEW Comparison: 09/16/2012 Findings: Changes of underlying COPD are identified with hyperinflation and underlying bronchitic change. Slight increase in markings noted at the left lung base appear unchanged in comparison with the prior exam, again suspicious for a small focus of bronchopneumonia at the left base. An overall increase in interstitial markings is identified and also unchanged. No new focal infiltrates or signs of congestive failure are identified. No pleural fluid is seen. Heart and mediastinal contours are stable with aortic calcification and mild prominence of the pulmonary arterial segments. Bilateral apical pleural thickening is noted with the dense nodule medially in the right apex unchanged. IMPRESSION: Unchanged left lower lobe infiltrate, interstitial prominence and area of focal apical nodularity. Original Report Authenticated By: Amber Snow, M.D.   ASSESSMENT / PLAN:  Acute respiratory failure 2nd to acute COPD exacerbation with Lt lower lobe infiltrate.  The pt appears very comfortable today off oxygen, and feels her chest congestion is much improved.   Plan:  -would convert to oral steroids today or tomorrow, and taper off prednisone as outpt over 8 days. -transition to nebulized medications while acutely ill (pulmicort/brovana) >> transition back to inhalers (spiriva/symbicort) when she has clinical improvement  -adjust oxygen to keep SpO2 > 90%  -f/u CXR intermittently   Hx of RUL pulmonary nodule - previously noted on chest CT from 06/01/12. Followed by Amber Snow.  Plan:  -Will follow as outpatient.   Chronic systolic/diastolic CHF, CAD.  EF 35 to 40% with grade 1 diastolic dysfx from Echo 08/28/11.  Plan:  -goal  even fluid balance  Nothing further to add.  I suspect she will be able to go home in next 48 hrs.  Make sure she has f/u with Amber Snow about 2 weeks post d/c.  Will sign off, but call if questions or problems.

## 2012-09-18 NOTE — Progress Notes (Signed)
PHARMACIST - PHYSICIAN COMMUNICATION DR:   TRH  CONCERNING: Antibiotic IV to Oral Route Change Policy  RECOMMENDATION: This patient is receiving Levofloxacin by the intravenous route.  Based on criteria approved by the Pharmacy and Therapeutics Committee, the antibiotic(s) is/are being converted to the equivalent oral dose form(s).   DESCRIPTION: These criteria include:  Patient being treated for a respiratory tract infection, urinary tract infection, or cellulitis  The patient is not neutropenic and does not exhibit a GI malabsorption state  The patient is eating (either orally or via tube) and/or has been taking other orally administered medications for a least 24 hours  The patient is improving clinically (per notes), and has a Tmax < 100.5  If you have questions about this conversion, please contact the Pharmacy Department  []   [phone] )  [facility] []   [phone] )  [facility]  []   [phone] )  [facility] [x]   [phone] )  [facility]   Based on CrCl will also change dose form q24h to q48h  Thanks,  Juliette Alcide, PharmD, BCPS.   Pager: [phone] 09/18/2012  11:38 AM

## 2012-09-19 ENCOUNTER — Inpatient Hospital Stay (HOSPITAL_COMMUNITY): Payer: Medicare Other

## 2012-09-19 LAB — BASIC METABOLIC PANEL
BUN: 23 mg/dL (ref 6–23)
Calcium: 8.9 mg/dL (ref 8.4–10.5)
Creatinine, Ser: 0.65 mg/dL (ref 0.50–1.10)
GFR calc non Af Amer: 83 mL/min — ABNORMAL LOW (ref >90)
Glucose, Bld: 111 mg/dL — ABNORMAL HIGH (ref 70–99)

## 2012-09-19 LAB — CBC
HCT: 36.6 % (ref 36.0–46.0)
Hemoglobin: 12.1 g/dL (ref 12.0–15.0)
MCH: 30.3 pg (ref 26.0–34.0)
MCHC: 33.1 g/dL (ref 30.0–36.0)

## 2012-09-19 MED ORDER — LEVOFLOXACIN 750 MG PO TABS
750.0000 mg | ORAL_TABLET | ORAL | Status: DC
  Administered 2012-09-19: 750 mg via ORAL
  Filled 2012-09-19: qty 1

## 2012-09-19 MED ORDER — LEVOFLOXACIN 750 MG PO TABS: 750.0000 mg | ORAL_TABLET | Freq: Every day | ORAL | Status: DC

## 2012-09-19 MED ORDER — LEVOFLOXACIN 500 MG PO TABS: 500.0000 mg | ORAL_TABLET | Freq: Every day | ORAL | Status: DC

## 2012-09-19 MED ORDER — PREDNISONE 20 MG PO TABS
40.0000 mg | ORAL_TABLET | Freq: Every day | ORAL | Status: DC
  Administered 2012-09-19 – 2012-09-20 (×2): 40 mg via ORAL
  Filled 2012-09-19 (×3): qty 2

## 2012-09-19 NOTE — Progress Notes (Signed)
[facility] PROGRESS NOTE  Amber Snow BJY:[id number] DOB: [birth year] DOA: 09/16/2012 PCP: Marga Melnick, MD  Assessment/Plan: Active Problems:   VITAMIN D DEFICIENCY   HYPERLIPIDEMIA   LEFT BUNDLE BRANCH BLOCK   COPD GOLD II   Nonischemic cardiomyopathy   Sepsis   CAP (community acquired pneumonia)    1. Community acquired pneumonia:  Patient presented with weakness, a dry cough and progressive SOB. She was found to have WBC 18.2, fever, tachycardia and CXR demonstrated moderate changes of acute bronchitis and/or asthma with probable bronchopneumonia involving the left lower lobe. Managing with iv Levaquin now day# 4 , as well as supportive treatment. Urine Legionella and Streptococcus pneumoniae antigens are negative. Patient has remained apyrexial since hospitalization, and is feeling much better. Cough remains non-productive. WCC is trending down. Repeat CXR of 09/19/12, is devoid of active disease. Will transition to oral Levaquin from 09/20/12, for another 4 days of antibiotic therapy, to be concluded on 09/23/12.  2. COPD Exacerbation: Patient has known COPD GOLD-2, with FEV1 of 63% on PFTs of 07/29/2012. Exacerbated by #1 above. Managing with steroids, bronchodilators and oxygen, as well as antibiotics, with gradual clinical improvement. Dr Coralyn Helling provided pulmonary consultation, and we are managing as recommended. Clinically, exacerbation has practically resolved as of 09/19/12. Transitioned to oral steroid taper today.  3. Acute hypoxic respiratory failure: At presentation, patient was hypoxemic with oxygen saturation of 85% on room air which improved to 95% on 2 L. This is secondary to #s 1&2 above. Managing as described. As of 09/18/12, patient is saturating at 96%-97% on RA.  4. Nonischemic cardiomyopathy: Patient has known cardiomyopathy with diastolic dysfunction and chronic LBBB. EF 35-40% , grade 1 diastolic dysfunction. Cardiac catheterization on 09/12/2011 revealed  minimal nonobstructive coronary disease. Clinically, patient has no evidence of decompensation, and volume status is satisfactory. Although ProBNP was elevated at 1065 on presentation, this is likely due to strain, imposed by acute pulmonary problems. Monitoring volume status closely. No clinical decompensation, during this hospitalization.  5. Hyperlipidemia: On statin. 6. Lung nodule: Patient has a documented RUL nodule, noted on chest x-ray 07/29/2012. She has already been evaluated by Dr Sherene Sires, pulmonologist in the office, and the plan is for conservative f/u and proceed to PET/Excisional bx if shows growth at 6 months from discovery. For office folow up with Dr Sherene Sires in 2 weeks, after discharge.    Code Status: Full Code.  Family Communication:  Disposition Plan: To be determined. Aiming discharge on 09/20/12 or 09/21/12.    Brief narrative: 79 year old female with a history of COPD, chronic systolic CHF, CAD, diverticulosis, bladder tumors, and LBBB, discharged from [facility] on 06/05/2012 after admission for COPD exacerbation.presenting with a two-day history of feeling "tired", shortness of breath, and intermittent chest discomfort, associated with worsening dry cough without hemoptysis. Around 11:00 o'clock on the morning before admission, the patient noted some chills and took her temperature, and it was noted to be 102.5F at home. Her symptoms continued to progress and did not remit with use of her rescue inhalers. As a result she came to the emergency department for further evaluation. In the emergency department, the patient was given aerosolized Albuterol and Atrovent as well as Prednisone 60 mg and was started on Ceftriaxone and Azithromycin. Initially, the patient was hypoxemic with oxygen saturation of 85% on room air which improved to 95% on 2 L. Temperature 100.66F, WBC 18.2. BMP was unremarkable. Urinalysis negative for pyuria. Chest x-ray showed left lower lobe opacity and slight increase  in  right lower lobe opacity. Admitted for further management.    Consultants:  N/A.   Procedures:  CXR.   Antibiotics:  Azithromycin 09/16/12.   Levaquin 09/16/12>>>  Rocephin 09/16/12.   HPI/Subjective: No new issues. Ambulating.   Objective: Vital signs in last 24 hours: Temp:  [97.6 F (36.4 C)-98.5 F (36.9 C)] 98.5 F (36.9 C) (05/25 0506) Pulse Rate:  [80-89] 80 (05/25 0506) Resp:  [20] 20 (05/25 0506) BP: (134-137)/(55-63) 137/55 mmHg (05/25 0506) SpO2:  [92 %-95 %] 93 % (05/25 0905) Weight:  [45.5 kg (100 lb 5 oz)] 45.5 kg (100 lb 5 oz) (05/25 0506) Weight change: -0.041 kg (-1.5 oz) Last BM Date: 09/18/12  Intake/Output from previous day: 05/24 0701 - 05/25 0700 In: 480 [P.O.:480] Out: 2300 [Urine:2300] Total I/O In: 120 [P.O.:120] Out: 300 [Urine:300]   Physical Exam: General: Comfortable, alert, communicative, fully oriented, not short of breath at rest.  HEENT:  No clinical pallor, no jaundice, no conjunctival injection or discharge. Hydration is fair.  NECK:  Supple, JVP not seen, no carotid bruits, no palpable lymphadenopathy, no palpable goiter. CHEST:  Clinically clear. No wheeze, no crackles. Marland Kitchen  HEART:  Sounds 1 and 2 heard, normal, regular, no murmurs. ABDOMEN:  Full, soft, non-tender, no palpable organomegaly, no palpable masses, normal bowel sounds. GENITALIA:  Not examined. LOWER EXTREMITIES:  No pitting edema, palpable peripheral pulses. MUSCULOSKELETAL SYSTEM:  Generalized osteoarthritic changes, otherwise, normal. CENTRAL NERVOUS SYSTEM:  No focal neurologic deficit on gross examination.  Lab Results:  Recent Labs  09/18/12 0520 09/19/12 0459  WBC 17.0* 14.4*  HGB 12.1 12.1  HCT 37.1 36.6  PLT 227 248    Recent Labs  09/18/12 0520 09/19/12 0459  NA 137 136  K 4.6 4.7  CL 102 104  CO2 23 25  GLUCOSE 131* 111*  BUN 23 23  CREATININE 0.76 0.65  CALCIUM 9.4 8.9   Recent Results (from the past 240 hour(s))  CULTURE, BLOOD  (ROUTINE X 2)     Status: None   Collection Time    09/16/12  8:45 PM      Result Value Range Status   Specimen Description BLOOD RIGHT HAND   Final   Special Requests BOTTLES DRAWN AEROBIC AND ANAEROBIC 10CC   Final   Culture  Setup Time 09/17/2012 01:46   Final   Culture     Final   Value:        BLOOD CULTURE RECEIVED NO GROWTH TO DATE CULTURE WILL BE HELD FOR 5 DAYS BEFORE ISSUING A FINAL NEGATIVE REPORT   Report Status PENDING   Incomplete  CULTURE, BLOOD (ROUTINE X 2)     Status: None   Collection Time    09/16/12  9:05 PM      Result Value Range Status   Specimen Description BLOOD RIGHT ARM   Final   Special Requests BOTTLES DRAWN AEROBIC AND ANAEROBIC 10CC   Final   Culture  Setup Time 09/17/2012 01:46   Final   Culture     Final   Value:        BLOOD CULTURE RECEIVED NO GROWTH TO DATE CULTURE WILL BE HELD FOR 5 DAYS BEFORE ISSUING A FINAL NEGATIVE REPORT   Report Status PENDING   Incomplete     Studies/Results: Dg Chest 2 View  09/19/2012   *RADIOLOGY REPORT*  Clinical Data: Pneumonia, cough, shortness of breath, history of COPD  CHEST - 2 VIEW  Comparison: 09/17/2012; 06/01/2012; chest CT - 06/01/2012  Findings:  Grossly unchanged cardiac silhouette and mediastinal contours with atherosclerotic calcifications within the thoracic aorta.  The lungs remain hyperexpanded with flattening of bilateral hemidiaphragms mild diffuse thickening of the pulmonary interstitium. Interval resolution of left lower lung heterogeneous airspace opacities.  Grossly unchanged slightly asymmetric right apical pleuroparenchymal thickening.  No pleural effusion or pneumothorax.  No definite evidence of edema.  Unchanged bones.  IMPRESSION: 1.  Hyperexpanded lungs without acute cardiopulmonary disease.  2.  Grossly unchanged asymmetric right apical pleuroparenchymal thickening, possibly at the location of previously noted apical pulmonary nodule on recent chest CT.  Given the advanced emphysematous change,  further evaluation with chest CT to ensure stability and/or resolution of the previously identified pulmonary nodule is recommended.   Original Report Authenticated By: Tacey Ruiz, MD   Dg Chest Port 1 View  09/17/2012   *RADIOLOGY REPORT*  Clinical Data: Acute exacerbation of COPD.  Evaluate for pneumonia.  PORTABLE CHEST - 1 VIEW  Comparison: 09/16/2012  Findings: Changes of underlying COPD are identified with hyperinflation and underlying bronchitic change.  Slight increase in markings noted at the left lung base appear unchanged in comparison with the prior exam, again suspicious for a small focus of bronchopneumonia at the left base. An overall increase in interstitial markings is identified and also unchanged. No new focal infiltrates or signs of congestive failure are identified. No pleural fluid is seen.  Heart and mediastinal contours are stable with aortic calcification and mild prominence of the pulmonary arterial segments.  Bilateral apical pleural thickening is noted with the dense nodule  medially in the right apex unchanged.  IMPRESSION: Unchanged left lower lobe infiltrate, interstitial prominence and area of focal apical nodularity.   Original Report Authenticated By: Rhodia Albright, M.D.    Medications: Scheduled Meds: . arformoterol  15 mcg Nebulization BID  . budesonide (PULMICORT) nebulizer solution  0.5 mg Nebulization BID  . calcium carbonate  2.5 tablet Oral Q breakfast  . cholecalciferol  1,000 Units Oral Daily  . enoxaparin (LOVENOX) injection  40 mg Subcutaneous Q24H  . guaiFENesin  600 mg Oral BID  . levofloxacin  750 mg Oral Q48H  . losartan  50 mg Oral Daily  . methylPREDNISolone (SOLU-MEDROL) injection  40 mg Intravenous Q12H  . multivitamin with minerals  1 tablet Oral Daily  . polyethylene glycol  17 g Oral Daily  . pravastatin  40 mg Oral QPM  . traZODone  50 mg Oral QHS  . vitamin C  1,000 mg Oral Daily  . vitamin E  400 Units Oral Daily   Continuous  Infusions:  PRN Meds:.acetaminophen, acetaminophen, albuterol, ipratropium, ondansetron (ZOFRAN) IV, ondansetron    LOS: 3 days   Justyna Timoney,CHRISTOPHER  [facility] Pager [phone]. If 8PM-8AM, please contact night-coverage at [email or web address], password [facility] 09/19/2012, 11:43 AM  LOS: 3 days

## 2012-09-19 NOTE — Discharge Summary (Signed)
Physician Discharge Summary  Amber Snow ZOX:[id number] DOB: 1933/02/28 DOA: 09/16/2012  PCP: Marga Melnick, MD  Admit date: 09/16/2012 Discharge date: 09/20/2012  Time spent: 40 minutes  Recommendations for Outpatient Follow-up:  1. Follow up with primary MD. 2. Follow up with Dr Sandrea Hughs, pulmonologist, in 2 weeks.   Discharge Diagnoses:  Active Problems:   VITAMIN D DEFICIENCY   HYPERLIPIDEMIA   LEFT BUNDLE BRANCH BLOCK   COPD GOLD II   Nonischemic cardiomyopathy   Sepsis   CAP (community acquired pneumonia)   Discharge Condition: Satisfactory.   Diet recommendation: Regular.   Filed Weights   09/18/12 0500 09/19/12 0506 09/20/12 0600  Weight: 45.541 kg (100 lb 6.4 oz) 45.5 kg (100 lb 5 oz) 45.4 kg (100 lb 1.4 oz)    History of present illness:  79 year old female with a history of COPD, chronic systolic CHF, CAD, diverticulosis, bladder tumors, and LBBB, discharged from [facility] on 06/05/2012 after admission for COPD exacerbation.presenting with a two-day history of feeling "tired", shortness of breath, and intermittent chest discomfort, associated with worsening dry cough without hemoptysis. Around 11:00 o'clock on the morning before admission, the patient noted some chills and took her temperature, and it was noted to be 102.43F at home. Her symptoms continued to progress and did not remit with use of her rescue inhalers. As a result she came to the emergency department for further evaluation. In the emergency department, the patient was given aerosolized Albuterol and Atrovent as well as Prednisone 60 mg and was started on Ceftriaxone and Azithromycin. Initially, the patient was hypoxemic with oxygen saturation of 85% on room air which improved to 95% on 2 L. Temperature 100.22F, WBC 18.2. BMP was unremarkable. Urinalysis negative for pyuria. Chest x-ray showed left lower lobe opacity and slight increase in right lower lobe opacity. Admitted for further management.     Hospital Course:  1. Community acquired pneumonia: Patient presented with weakness, a dry cough and progressive SOB. She was found to have WBC 18.2, fever, tachycardia and CXR demonstrated moderate changes of acute bronchitis and/or asthma with probable bronchopneumonia involving the left lower lobe. Managed with iv Levaquin, as well as supportive treatment. Urine Legionella and Streptococcus pneumoniae antigens were negative. Clinical improvement was steady and satisfactory. Patient remained afebrile during her hospitalization, improved in well being, wcc trended down, cough remained non-productive and repeat CXR of 09/19/12, was devoid of active disease. Transitioned to Levaquin from 09/19/12, to conclude antibiotic therapy on 09/23/12.  2. COPD Exacerbation: Patient has known COPD GOLD-2, with FEV1 of 63% on PFTs of 07/29/2012. Exacerbated by #1 above. Managed with steroids, bronchodilators and oxygen, as well as antibiotics. Dr Coralyn Helling provided pulmonary consultation, and was very helpful in optimizing COPD treatment. As of 09/19/12, COPD exacerbation had clincally practically resolved. Transitioned to oral steroid taper on that date.  3. Acute hypoxic respiratory failure: At presentation, patient was hypoxemic with oxygen saturation of 85% on room air which improved to 95% on 2 L. This was secondary to #s 1&2 above. Managed as described and as of 09/18/12, respiratory failure had resolved, with patient saturating at 96%-97% on RA.  4. Nonischemic cardiomyopathy: Patient has known cardiomyopathy with diastolic dysfunction and chronic LBBB. EF 35-40% , grade 1 diastolic dysfunction. Cardiac catheterization on 09/12/2011 revealed minimal nonobstructive coronary disease. Clinically, patient had no evidence of decompensation during her hospitalization, and volume status remained satisfactory. Although ProBNP was elevated at 1065 on presentation, this was likely due to strain, imposed by acute pulmonary  problems.  5. Hyperlipidemia: On statin.  6. Lung nodule: Patient has a documented RUL nodule, noted on chest x-ray 07/29/2012. She has already been evaluated by Dr Sherene Sires, pulmonologist in the office, and the plan is for conservative f/u and proceed to PET/Excisional bx if shows growth at 6 months from discovery. For office folow up with Dr Sherene Sires in 2 weeks, after discharge.    Procedures:  See Below.   Consultations:  Dr Coralyn Helling, pulmonologist.   Discharge Exam: Filed Vitals:   09/19/12 2204 09/20/12 0600 09/20/12 0836 09/20/12 0848  BP: 127/60 133/79    Pulse: 94 83    Temp: 97.9 F (36.6 C) 98.6 F (37 C)    TempSrc: Oral Oral    Resp: 18 20    Height:      Weight:  45.4 kg (100 lb 1.4 oz)    SpO2: 94% 95% 98% 96%    General: Comfortable, alert, communicative, fully oriented, not short of breath at rest.  HEENT: No clinical pallor, no jaundice, no conjunctival injection or discharge. Hydration is fair.  NECK: Supple, JVP not seen, no carotid bruits, no palpable lymphadenopathy, no palpable goiter.  CHEST: Clinically clear. No wheeze, no crackles. Marland Kitchen  HEART: Sounds 1 and 2 heard, normal, regular, no murmurs.  ABDOMEN: Full, soft, non-tender, no palpable organomegaly, no palpable masses, normal bowel sounds.  GENITALIA: Not examined.  LOWER EXTREMITIES: No pitting edema, palpable peripheral pulses.  MUSCULOSKELETAL SYSTEM: Generalized osteoarthritic changes, otherwise, normal.  CENTRAL NERVOUS SYSTEM: No focal neurologic deficit on gross examination.  Discharge Instructions      Discharge Orders   Future Appointments Provider Department Dept Phone   09/24/2012 11:15 AM Julio Sicks, NP [facility] [phone]   10/07/2012 10:30 AM [facility] SITE 3 ECHO LAB [phone]   10/07/2012 11:30 AM Peter M [place], MD [facility] Main Office [place]) [phone]   Future Orders Complete By Expires     Diet - low sodium  heart healthy  As directed     Diet general  As directed     Increase activity slowly  As directed         Medication List    TAKE these medications       albuterol 108 (90 BASE) MCG/ACT inhaler  Commonly known as:  PROVENTIL HFA;VENTOLIN HFA  Inhale 2 puffs into the lungs every 6 (six) hours as needed. Shortness of breath     BIOTIN PO  Take 1 capsule by mouth daily.     budesonide-formoterol 160-4.5 MCG/ACT inhaler  Commonly known as:  SYMBICORT  Inhale 2 puffs into the lungs 2 (two) times daily.     CALTRATE 600 1500 MG Tabs  Generic drug:  Calcium Carbonate  Take 2.5 tablets by mouth daily.     guaiFENesin 600 MG 12 hr tablet  Commonly known as:  MUCINEX  Take 1 tablet (600 mg total) by mouth 2 (two) times daily.     ibuprofen 200 MG tablet  Commonly known as:  ADVIL,MOTRIN  Take 600 mg by mouth every 6 (six) hours as needed for pain.     levofloxacin 750 MG tablet  Commonly known as:  LEVAQUIN  Take 1 tablet (750 mg total) by mouth every other day.     losartan 50 MG tablet  Commonly known as:  COZAAR  Take 50 mg by mouth at bedtime.     multivitamin with minerals Tabs  Take 1 tablet by mouth  daily.     polyethylene glycol packet  Commonly known as:  MIRALAX / GLYCOLAX  Take 17 g by mouth daily.     pravastatin 40 MG tablet  Commonly known as:  PRAVACHOL  Take 1 tablet (40 mg total) by mouth at bedtime.     predniSONE 10 MG tablet  Commonly known as:  DELTASONE  Take 40 mg daily for 3 days, then 30 mg daily for 3 days, then 20 mg daily for 3 days, then 10 mg daily for 3 days, then stop.     RECLAST 5 MG/100ML Soln  Generic drug:  zoledronic acid  Inject 5 mg into the vein once. Every year     tiotropium 18 MCG inhalation capsule  Commonly known as:  SPIRIVA  Place 18 mcg into inhaler and inhale daily.     traZODone 50 MG tablet  Commonly known as:  DESYREL  Take 50 mg by mouth at bedtime.     vitamin C 1000 MG tablet  Take 1,000 mg by mouth  daily.     Vitamin D 1000 UNITS capsule  Take 1,000 Units by mouth daily.     vitamin E 400 UNIT capsule  Take 400 Units by mouth daily.       Allergies  Allergen Reactions  . Barbiturates     REACTION: rash  . Penicillins Rash  . Shrimp (Shellfish Allergy) Swelling  . Aspirin     REACTION: nausea  . Beta Adrenergic Blockers     PT STATES TOLD TO AVOID DUE TO COUGH REACTION TO CARDIZEM  . Calcium Channel Blockers     PER PT TOLD TO AVOID DUE TO COUGH REACTION TO CARDIZEM  . Celecoxib     REACTION: nausea  . Codeine     REACTION: excessive sleep  . Diltiazem Hcl Other (See Comments)    REACTION: cough  . Fluvastatin Sodium     REACTION: intolerance  . Ibandronate Sodium Other (See Comments)    REACTION: HAIR LOSS/NAIL CHANGES  . Rosuvastatin     REACTION: intolerance  . Simvastatin     REACTION: intolerance   Follow-up Information   Follow up with [name],TAMMY, NP On 09/24/2012. (1045 for check in, then downstairs for CXR, after that you will see Tammy at 1115)    Contact information:   [address] [place] [address] [phone]       Schedule an appointment as soon as possible for a visit with Marga Melnick, MD.   Contact information:   [phone] [facility] [address] [place] [address] [phone]        The results of significant diagnostics from this hospitalization (including imaging, microbiology, ancillary and laboratory) are listed below for reference.    Significant Diagnostic Studies: Dg Chest 2 View  09/19/2012   *RADIOLOGY REPORT*  Clinical Data: Pneumonia, cough, shortness of breath, history of COPD  CHEST - 2 VIEW  Comparison: 09/17/2012; 06/01/2012; chest CT - 06/01/2012  Findings:  Grossly unchanged cardiac silhouette and mediastinal contours with atherosclerotic calcifications within the thoracic aorta.  The lungs remain hyperexpanded with flattening of bilateral hemidiaphragms mild diffuse thickening of the pulmonary  interstitium. Interval resolution of left lower lung heterogeneous airspace opacities.  Grossly unchanged slightly asymmetric right apical pleuroparenchymal thickening.  No pleural effusion or pneumothorax.  No definite evidence of edema.  Unchanged bones.  IMPRESSION: 1.  Hyperexpanded lungs without acute cardiopulmonary disease.  2.  Grossly unchanged asymmetric right apical pleuroparenchymal thickening, possibly  at the location of previously noted apical pulmonary nodule on recent chest CT.  Given the advanced emphysematous change, further evaluation with chest CT to ensure stability and/or resolution of the previously identified pulmonary nodule is recommended.   Original Report Authenticated By: Tacey Ruiz, MD   Dg Chest 2 View  09/16/2012   *RADIOLOGY REPORT*  Clinical Data: Fever.  Shortness of breath.  Current history of COPD/emphysema.  CHEST - 2 VIEW  Comparison: Two-view chest x-ray 07/29/2012, 06/01/2012, 09/04/2011.  Findings: Suboptimal inspiration.  Prominent bronchovascular markings diffusely and moderate central peribronchial thickening, more so than on the prior examinations. Patchy opacity in the left lower lobe.  Biapical pleuroparenchymal scarring, unchanged.  Lungs otherwise clear.  No convincing pleural effusions.  Mild degenerative changes involving the thoracic spine.  Cardiac silhouette normal in size, unchanged.  Thoracic aorta atherosclerotic, unchanged.  Hilar and mediastinal contours otherwise unremarkable.  IMPRESSION: Moderate changes of acute bronchitis and/or asthma with probable bronchopneumonia involving the left lower lobe, superimposed upon baseline COPD/emphysema.   Original Report Authenticated By: Hulan Saas, M.D.   Dg Chest Port 1 View  09/17/2012   *RADIOLOGY REPORT*  Clinical Data: Acute exacerbation of COPD.  Evaluate for pneumonia.  PORTABLE CHEST - 1 VIEW  Comparison: 09/16/2012  Findings: Changes of underlying COPD are identified with hyperinflation and  underlying bronchitic change.  Slight increase in markings noted at the left lung base appear unchanged in comparison with the prior exam, again suspicious for a small focus of bronchopneumonia at the left base. An overall increase in interstitial markings is identified and also unchanged. No new focal infiltrates or signs of congestive failure are identified. No pleural fluid is seen.  Heart and mediastinal contours are stable with aortic calcification and mild prominence of the pulmonary arterial segments.  Bilateral apical pleural thickening is noted with the dense nodule  medially in the right apex unchanged.  IMPRESSION: Unchanged left lower lobe infiltrate, interstitial prominence and area of focal apical nodularity.   Original Report Authenticated By: Rhodia Albright, M.D.    Microbiology: Recent Results (from the past 240 hour(s))  CULTURE, BLOOD (ROUTINE X 2)     Status: None   Collection Time    09/16/12  8:45 PM      Result Value Range Status   Specimen Description BLOOD RIGHT HAND   Final   Special Requests BOTTLES DRAWN AEROBIC AND ANAEROBIC 10CC   Final   Culture  Setup Time 09/17/2012 01:46   Final   Culture     Final   Value:        BLOOD CULTURE RECEIVED NO GROWTH TO DATE CULTURE WILL BE HELD FOR 5 DAYS BEFORE ISSUING A FINAL NEGATIVE REPORT   Report Status PENDING   Incomplete  CULTURE, BLOOD (ROUTINE X 2)     Status: None   Collection Time    09/16/12  9:05 PM      Result Value Range Status   Specimen Description BLOOD RIGHT ARM   Final   Special Requests BOTTLES DRAWN AEROBIC AND ANAEROBIC 10CC   Final   Culture  Setup Time 09/17/2012 01:46   Final   Culture     Final   Value:        BLOOD CULTURE RECEIVED NO GROWTH TO DATE CULTURE WILL BE HELD FOR 5 DAYS BEFORE ISSUING A FINAL NEGATIVE REPORT   Report Status PENDING   Incomplete     Labs: Basic Metabolic Panel:  Recent Labs Lab 09/16/12 1715 09/16/12  2105 09/17/12 0510 09/18/12 0520 09/19/12 0459  09/20/12 0423  NA 138  --  136 137 136 138  K 3.9  --  3.9 4.6 4.7 4.2  CL 103  --  102 102 104 103  CO2 24  --  25 23 25 27   GLUCOSE 116*  --  129* 131* 111* 90  BUN 18  --  17 23 23 18   CREATININE 0.68 0.75 0.76 0.76 0.65 0.69  CALCIUM 9.4  --  9.2 9.4 8.9 8.9   Liver Function Tests: No results found for this basename: AST, ALT, ALKPHOS, BILITOT, PROT, ALBUMIN,  in the last 168 hours No results found for this basename: LIPASE, AMYLASE,  in the last 168 hours No results found for this basename: AMMONIA,  in the last 168 hours CBC:  Recent Labs Lab 09/16/12 1715 09/16/12 2105 09/17/12 0510 09/18/12 0520 09/19/12 0459 09/20/12 0423  WBC 18.2* 20.0* 17.3* 17.0* 14.4* 12.2*  NEUTROABS 16.6*  --   --   --   --   --   HGB 12.8 12.3 12.4 12.1 12.1 12.9  HCT 37.2 36.9 36.2 37.1 36.6 37.7  MCV 91.2 91.8 91.9 92.1 91.7 91.1  PLT 236 205 218 227 248 279   Cardiac Enzymes:  Recent Labs Lab 09/16/12 2105 09/17/12 0225 09/17/12 0835  TROPONINI <0.30 <0.30 <0.30   BNP: BNP (last 3 results)  Recent Labs  06/01/12 1640 09/16/12 1715 09/18/12 0520  PROBNP 800.1* 1065.0* 1419.0*   CBG: No results found for this basename: GLUCAP,  in the last 168 hours     Signed:  Korrey Schleicher,CHRISTOPHER  [facility] 09/20/2012, 11:58 AM

## 2012-09-20 LAB — BASIC METABOLIC PANEL
Calcium: 8.9 mg/dL (ref 8.4–10.5)
GFR calc non Af Amer: 82 mL/min — ABNORMAL LOW (ref >90)
Glucose, Bld: 90 mg/dL (ref 70–99)
Sodium: 138 mEq/L (ref 135–145)

## 2012-09-20 LAB — CBC
MCH: 31.2 pg (ref 26.0–34.0)
MCHC: 34.2 g/dL (ref 30.0–36.0)
Platelets: 279 10*3/uL (ref 150–400)

## 2012-09-20 MED ORDER — PREDNISONE 10 MG PO TABS: ORAL_TABLET | ORAL | Status: DC

## 2012-09-20 MED ORDER — GUAIFENESIN ER 600 MG PO TB12: 600.0000 mg | ORAL_TABLET | Freq: Two times a day (BID) | ORAL | Status: DC

## 2012-09-20 MED ORDER — LEVOFLOXACIN 750 MG PO TABS: 750.0000 mg | ORAL_TABLET | ORAL | Status: DC

## 2012-09-20 MED ORDER — POLYETHYLENE GLYCOL 3350 17 G PO PACK: 17.0000 g | PACK | Freq: Every day | ORAL | Status: DC

## 2012-09-21 ENCOUNTER — Telehealth: Payer: Self-pay | Admitting: Internal Medicine

## 2012-09-21 MED ORDER — MAGIC MOUTHWASH: 5.0000 mL | Freq: Three times a day (TID) | ORAL | Status: DC

## 2012-09-21 NOTE — Telephone Encounter (Signed)
Magic mouthwash 5 cc gargle well and swallowed 3 times a day; 90 cc

## 2012-09-21 NOTE — Telephone Encounter (Signed)
Discuss with patient, Rx sent. 

## 2012-09-21 NOTE — Telephone Encounter (Signed)
Patient called stating she was just released from the hospital (post hosp f/u visit scheduled for 6/2) and now has thrush and would like something sent to the pharmacy for her. [facility] on [address]

## 2012-09-23 LAB — CULTURE, BLOOD (ROUTINE X 2): Culture: NO GROWTH

## 2012-09-24 ENCOUNTER — Ambulatory Visit (INDEPENDENT_AMBULATORY_CARE_PROVIDER_SITE_OTHER)
Admission: RE | Admit: 2012-09-24 | Discharge: 2012-09-24 | Disposition: A | Discharge status: Home / Self Care | Payer: Medicare Other | Source: Ambulatory Visit | Attending: Adult Health | Admitting: Adult Health

## 2012-09-24 ENCOUNTER — Ambulatory Visit (INDEPENDENT_AMBULATORY_CARE_PROVIDER_SITE_OTHER): Payer: Medicare Other | Admitting: Adult Health

## 2012-09-24 ENCOUNTER — Encounter: Payer: Self-pay | Admitting: Adult Health

## 2012-09-24 VITALS — BP 122/68 | HR 76 | Temp 97.6°F | Ht 63.5 in | Wt 102.8 lb

## 2012-09-24 DIAGNOSIS — J189 Pneumonia, unspecified organism: Secondary | ICD-10-CM

## 2012-09-24 DIAGNOSIS — R911 Solitary pulmonary nodule: Secondary | ICD-10-CM

## 2012-09-24 DIAGNOSIS — J441 Chronic obstructive pulmonary disease with (acute) exacerbation: Secondary | ICD-10-CM

## 2012-09-24 NOTE — Progress Notes (Signed)
Subjective:    Patient ID: Amber Snow, female    DOB: 03-Aug-1932  MRN: [id number]  HPI  26 yowf with GOLD II copd by pfts 07/2009 quit smoking June 2013  due to progressive doe x 5 years worse since quit referred to pulmonary clinic 06/17/2012 by Dr Lona Kettle p admit :  Admit date: 06/01/2012  Discharge date: 06/05/2012  Discharge Diagnoses:  Active Problems:  HYPERLIPIDEMIA  LEFT BUNDLE BRANCH BLOCK  COPD  LOW BACK PAIN, CHRONIC  LV dysfunction  Lung nodule > outpt pet rec COPD exacerbation  Nonischemic cardiomyopathy   COPD exacerbation  -CT angiogram of the chest was negative for pulmonary embolus  -Patient was started on Solu-Medrol, Levaquin, and bronchodilators  -Wheezing has improved with Solu-Medrol  -changed to by mouth prednisone without decompensation  -Continue every 6 hour albuterol-And when necessary for shortness of breath  -Continue levofloxacin--adjust Levaquin dose for renal function--final dose to be given on 06/06/2012 to complete 5 days  -I believe that her continuing dyspnea on exertion is a combination of her COPD exacerbation, cardiomyopathy, and deconditioning.  -The patient was subsequently weaned off of oxygen supplementation. Her oxygen saturation was 90-94% with activity on room air.  -She will go home with a prednisone wean starting with 50 mg on day #1 and decreasing by 10 mg daily    06/17/2012 1st pulmonary eval/ Wert s/p hosp in January 2014 cc doe x 50 ft variable improvement with saba =proaire rec Symbicort 2 every 12 hours Spiriva once daily  Only use your albuterol (proaire) as a rescue medication Work on inhaler technique:   07/29/2012  ov/Wert off cigs for a year f/u for copd/ R apical nodule Chief Complaint  Patient presents with  . Follow-up    Pt states breathing is the same, but overall feels better since last visit. Has occ prod cough with beige to clear sputum.    cc improved with less need for proaire and walk at [employer]  still has trouble more than one room vacuum >>set up for CT chest ~11/2012   09/24/2012 Post Hospital follow up  Admitted 5/22 -26 for CAP and COPD exacerbation w/ fever and LLL opcaity.  Tx w/ IV abx, nebs and steroids  Discharged on Levaquin , steroid taper.  She is feeling better but still weak.  Reports is "so-so" w/ dyspnea, occ wheezing, dry hacking cough, chest congestion, general malaise.   denies chest tightness, f/c/s.  currently taking 3 tabs prednisone CXR shows resolution of LLL opacity , COPD changes  No hemoptysis, chest pain , edema or fever.    ROS  Constitutional:   No  weight loss, night sweats,  Fevers, chills, +fatigue, or  lassitude.  HEENT:   No headaches,  Difficulty swallowing,  Tooth/dental problems, or  Sore throat,                No sneezing, itching, ear ache, + nasal congestion, post nasal drip,   CV:  No chest pain,  Orthopnea, PND, swelling in lower extremities, anasarca, dizziness, palpitations, syncope.   GI  No heartburn, indigestion, abdominal pain, nausea, vomiting, diarrhea, change in bowel habits, loss of appetite, bloody stools.   Resp:  No coughing up of blood.   Marland Kitchen  No chest wall deformity  Skin: no rash or lesions.  GU: no dysuria, change in color of urine, no urgency or frequency.  No flank pain, no hematuria   MS:  No joint pain or swelling.  No decreased range  of motion.  No back pain.  Psych:  No change in mood or affect. No depression or anxiety.  No memory loss.            Objective:   Physical Exam  07/29/2012  105  >102 09/24/2012     HEENT mild turbinate edema.  Oropharynx no thrush or excess pnd or cobblestoning.  No JVD or cervical adenopathy. Mild accessory muscle hypertrophy. Trachea midline, nl thryroid. Chest was hyperinflated by percussion with diminished breath sounds and moderate increased exp time without wheeze. Hoover sign positive at mid inspiration. Regular rate and rhythm without murmur gallop or rub or  increase P2 or edema.  Abd: no hsm, nl excursion. Ext warm without cyanosis or clubbing.    CXR   09/24/2012  COPD/emphysema. No acute cardiopulmonary disease. Stable mild  cardiomegaly without pulmonary edema.      Assessment & Plan:

## 2012-09-24 NOTE — Assessment & Plan Note (Signed)
Will need CT chest ~11/2012 for follow up

## 2012-09-24 NOTE — Assessment & Plan Note (Signed)
Clinically improving w/ clearance on cXR

## 2012-09-24 NOTE — Assessment & Plan Note (Addendum)
Recent exacerbation now resolving  Taper off steroids as planned.  Cont on current regimen. follow up Dr. Sherene Sires  In 4 weeks and As needed   Please contact office for sooner follow up if symptoms do not improve or worsen or seek emergency care

## 2012-09-24 NOTE — Patient Instructions (Addendum)
Continue on current regimen .  follow up Dr. Sherene Sires  In 4 weeks  and As needed   Please contact office for sooner follow up if symptoms do not improve or worsen or seek emergency care

## 2012-09-25 ENCOUNTER — Emergency Department (HOSPITAL_COMMUNITY)
Admission: EM | Admit: 2012-09-25 | Discharge: 2012-09-25 | Disposition: A | Discharge status: Home / Self Care | Payer: Medicare Other | Attending: Emergency Medicine | Admitting: Emergency Medicine

## 2012-09-25 ENCOUNTER — Encounter (HOSPITAL_COMMUNITY): Payer: Self-pay | Admitting: Emergency Medicine

## 2012-09-25 ENCOUNTER — Emergency Department (HOSPITAL_COMMUNITY): Payer: Medicare Other

## 2012-09-25 DIAGNOSIS — Z8679 Personal history of other diseases of the circulatory system: Secondary | ICD-10-CM | POA: Insufficient documentation

## 2012-09-25 DIAGNOSIS — Z79899 Other long term (current) drug therapy: Secondary | ICD-10-CM | POA: Insufficient documentation

## 2012-09-25 DIAGNOSIS — M19049 Primary osteoarthritis, unspecified hand: Secondary | ICD-10-CM | POA: Insufficient documentation

## 2012-09-25 DIAGNOSIS — Z8739 Personal history of other diseases of the musculoskeletal system and connective tissue: Secondary | ICD-10-CM | POA: Insufficient documentation

## 2012-09-25 DIAGNOSIS — Z87442 Personal history of urinary calculi: Secondary | ICD-10-CM | POA: Insufficient documentation

## 2012-09-25 DIAGNOSIS — Z87448 Personal history of other diseases of urinary system: Secondary | ICD-10-CM | POA: Insufficient documentation

## 2012-09-25 DIAGNOSIS — N200 Calculus of kidney: Secondary | ICD-10-CM | POA: Insufficient documentation

## 2012-09-25 DIAGNOSIS — I5022 Chronic systolic (congestive) heart failure: Secondary | ICD-10-CM | POA: Insufficient documentation

## 2012-09-25 DIAGNOSIS — I2510 Atherosclerotic heart disease of native coronary artery without angina pectoris: Secondary | ICD-10-CM | POA: Insufficient documentation

## 2012-09-25 DIAGNOSIS — M7989 Other specified soft tissue disorders: Secondary | ICD-10-CM | POA: Insufficient documentation

## 2012-09-25 DIAGNOSIS — J449 Chronic obstructive pulmonary disease, unspecified: Secondary | ICD-10-CM | POA: Insufficient documentation

## 2012-09-25 DIAGNOSIS — Z87891 Personal history of nicotine dependence: Secondary | ICD-10-CM | POA: Insufficient documentation

## 2012-09-25 DIAGNOSIS — J4489 Other specified chronic obstructive pulmonary disease: Secondary | ICD-10-CM | POA: Insufficient documentation

## 2012-09-25 DIAGNOSIS — IMO0002 Reserved for concepts with insufficient information to code with codable children: Secondary | ICD-10-CM | POA: Insufficient documentation

## 2012-09-25 DIAGNOSIS — E785 Hyperlipidemia, unspecified: Secondary | ICD-10-CM | POA: Insufficient documentation

## 2012-09-25 DIAGNOSIS — Z8719 Personal history of other diseases of the digestive system: Secondary | ICD-10-CM | POA: Insufficient documentation

## 2012-09-25 LAB — URINE MICROSCOPIC-ADD ON

## 2012-09-25 LAB — CBC WITH DIFFERENTIAL/PLATELET
Basophils Absolute: 0 10*3/uL (ref 0.0–0.1)
Eosinophils Absolute: 0.1 10*3/uL (ref 0.0–0.7)
Lymphs Abs: 2.4 10*3/uL (ref 0.7–4.0)
MCH: 30.1 pg (ref 26.0–34.0)
Neutrophils Relative %: 78 % — ABNORMAL HIGH (ref 43–77)
Platelets: 282 10*3/uL (ref 150–400)
RBC: 4.49 MIL/uL (ref 3.87–5.11)
WBC: 18.2 10*3/uL — ABNORMAL HIGH (ref 4.0–10.5)

## 2012-09-25 LAB — URINALYSIS, ROUTINE W REFLEX MICROSCOPIC
Bilirubin Urine: NEGATIVE
Glucose, UA: NEGATIVE mg/dL
Hgb urine dipstick: NEGATIVE
Ketones, ur: NEGATIVE mg/dL
pH: 7.5 (ref 5.0–8.0)

## 2012-09-25 LAB — COMPREHENSIVE METABOLIC PANEL
ALT: 24 U/L (ref 0–35)
AST: 23 U/L (ref 0–37)
Albumin: 3.5 g/dL (ref 3.5–5.2)
Alkaline Phosphatase: 81 U/L (ref 39–117)
GFR calc Af Amer: 62 mL/min — ABNORMAL LOW (ref >90)
Glucose, Bld: 97 mg/dL (ref 70–99)
Potassium: 4 mEq/L (ref 3.5–5.1)
Sodium: 131 mEq/L — ABNORMAL LOW (ref 135–145)
Total Protein: 6.6 g/dL (ref 6.0–8.3)

## 2012-09-25 MED ORDER — HYDROMORPHONE HCL PF 1 MG/ML IJ SOLN
1.0000 mg | Freq: Once | INTRAMUSCULAR | Status: AC
  Administered 2012-09-25: 1 mg via INTRAVENOUS
  Filled 2012-09-25: qty 1

## 2012-09-25 MED ORDER — ONDANSETRON 4 MG PO TBDP: ORAL_TABLET | ORAL | Status: DC

## 2012-09-25 MED ORDER — OXYCODONE-ACETAMINOPHEN 5-325 MG PO TABS: 2.0000 | ORAL_TABLET | Freq: Four times a day (QID) | ORAL | Status: DC | PRN

## 2012-09-25 MED ORDER — KETOROLAC TROMETHAMINE 15 MG/ML IJ SOLN
15.0000 mg | Freq: Once | INTRAMUSCULAR | Status: AC
  Administered 2012-09-25: 15 mg via INTRAVENOUS
  Filled 2012-09-25: qty 1

## 2012-09-25 MED ORDER — ONDANSETRON HCL 4 MG/2ML IJ SOLN
4.0000 mg | Freq: Once | INTRAMUSCULAR | Status: AC
  Administered 2012-09-25: 4 mg via INTRAVENOUS
  Filled 2012-09-25: qty 2

## 2012-09-25 NOTE — ED Provider Notes (Signed)
History     CSN: [id number]  Arrival date & time 09/25/12  4098   First MD Initiated Contact with Patient 09/25/12 1004      Chief Complaint  Patient presents with  . Abdominal Pain  . Flank Pain  . Foot Swelling    (Consider location/radiation/quality/duration/timing/severity/associated sxs/prior treatment) Patient is a 79 y.o. female presenting with abdominal pain. The history is provided by the patient (pt complains of flank pain).  Abdominal Pain This is a new problem. The current episode started 2 days ago. The problem occurs constantly. The problem has not changed since onset.Pertinent negatives include no chest pain, no abdominal pain and no headaches. Nothing aggravates the symptoms. Nothing relieves the symptoms. She has tried nothing for the symptoms.    Past Medical History  Diagnosis Date  . Carotid bruit     Right  . Diverticulosis of colon   . Hyperlipidemia   . History of nephrolithiasis   . Osteopenia   . COPD, moderate   . Cough   . Skipped beats   . Arthritis HANDS  . CAD (coronary artery disease) 09/12/2011    minimal nonobstructive CAD per cath with EF of 35 to 40% and normal filling pressures and right heart pressures  . LV dysfunction   . Chronic systolic CHF (congestive heart failure)   . 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 HOH (hard of hearing)     bil hearing aids    Past Surgical History  Procedure Laterality Date  . Cosmetic surgery      eyelids  . Left long finger arthroplasty & pulley relase  02-17-2005  . Cysto/ left retrograde pyelogram/ left ureteral stent placement  10-16-2001    STONE  . Pulmonary function analysis  07-31-2009    MODERATE TO SEVERE OBSTRUCTIVE AIRWAY DISEASE/ INSIGNIFICANT RESPONSE TO BRONCHODILATORS/ EMPHYSEMA PATTERN/ MILD DECREASE IN DIFFUSE CAPACITY FEF 25-75 CHANGED BY 8%  . Tubal ligation  AGE 55'S  . Tonsillectomy  CHILD  . Extracorporeal shock wave lithotripsy  2003  . Cataract extraction w/ intraocular lens   implant, bilateral  1998  . Transurethral resection of bladder tumor  07/01/2011    Procedure: TRANSURETHRAL RESECTION OF BLADDER TUMOR (TURBT);  Surgeon: Lindaann Slough, MD;  Location: [facility];  Service: Urology;  Laterality: N/A;  CYSTO  . Cystoscopy  07/01/2011    Procedure: CYSTOSCOPY;  Surgeon: Lindaann Slough, MD;  Location: [facility];  Service: Urology;  Laterality: N/A;  retrieval l of bladder stone  . Orif finger / thumb fracture    . Cystoscopy N/A 08/20/2012    Procedure: CYSTOSCOPY;  Surgeon: Lindaann Slough, MD;  Location: [facility];  Service: Urology;  Laterality: N/A;  fulgeration of bladderm tumors    Family History  Problem Relation Age of Onset  . Heart disease Mother   . Hypertension Mother   . Hypertension Father   . Heart attack Father   . Heart disease Father   . Thyroid disease Sister   . Heart attack Sister   . Stroke Sister   . COPD Sister   . Heart attack Brother   . Cancer Maternal Grandmother     breast cancer  . Diabetes Paternal Grandmother   . Asthma Neg Hx     History  Substance Use Topics  . Smoking status: Former Smoker -- 1.00 packs/day for 50 years    Types: Cigarettes    Quit date: 09/27/2011  . Smokeless tobacco: Never  Used     Comment: statrted age 79- 39, up to 1ppd for ? years  . Alcohol Use: No    OB History   Grav Para Term Preterm Abortions TAB SAB Ect Mult Living                  Review of Systems  Constitutional: Negative for appetite change and fatigue.  HENT: Negative for congestion, sinus pressure and ear discharge.   Eyes: Negative for discharge.  Respiratory: Negative for cough.   Cardiovascular: Negative for chest pain.  Gastrointestinal: Negative for abdominal pain and diarrhea.  Genitourinary: Positive for flank pain. Negative for frequency and hematuria.  Musculoskeletal: Negative for back pain.  Skin: Negative for rash.  Neurological: Negative for seizures and  headaches.  Psychiatric/Behavioral: Negative for hallucinations.    Allergies  Barbiturates; Penicillins; Shrimp; Aspirin; Beta adrenergic blockers; Calcium channel blockers; Celecoxib; Codeine; Diltiazem hcl; Fluvastatin sodium; Ibandronate sodium; Rosuvastatin; and Simvastatin  Home Medications   Current Outpatient Rx  Name  Route  Sig  Dispense  Refill  . albuterol (PROVENTIL HFA;VENTOLIN HFA) 108 (90 BASE) MCG/ACT inhaler   Inhalation   Inhale 2 puffs into the lungs every 6 (six) hours as needed. Shortness of breath         . Alum & Mag Hydroxide-Simeth (MAGIC MOUTHWASH) SOLN   Oral   Take 5 mLs by mouth 3 (three) times daily. Gargle well and swallowing   90 mL   0     Magic mouthwash   . Ascorbic Acid (VITAMIN C) 1000 MG tablet   Oral   Take 1,000 mg by mouth daily.          Marland Kitchen BIOTIN PO   Oral   Take 1 capsule by mouth daily.         . budesonide-formoterol (SYMBICORT) 160-4.5 MCG/ACT inhaler   Inhalation   Inhale 2 puffs into the lungs 2 (two) times daily.   1 Inhaler   3   . Calcium Carbonate (CALTRATE 600) 1500 MG TABS   Oral   Take 2.5 tablets by mouth daily.          . Cholecalciferol (VITAMIN D) 1000 UNITS capsule   Oral   Take 1,000 Units by mouth daily.          Marland Kitchen ibuprofen (ADVIL,MOTRIN) 200 MG tablet   Oral   Take 600 mg by mouth every 6 (six) hours as needed for pain.         Marland Kitchen losartan (COZAAR) 50 MG tablet   Oral   Take 50 mg by mouth at bedtime.         . Multiple Vitamin (MULITIVITAMIN WITH MINERALS) TABS   Oral   Take 1 tablet by mouth daily.         . pravastatin (PRAVACHOL) 40 MG tablet   Oral   Take 1 tablet (40 mg total) by mouth at bedtime.   90 tablet   3   . predniSONE (DELTASONE) 10 MG tablet      Take 40 mg daily for 3 days, then 30 mg daily for 3 days, then 20 mg daily for 3 days, then 10 mg daily for 3 days, then stop.   30 tablet   0   . tiotropium (SPIRIVA) 18 MCG inhalation capsule   Inhalation    Place 18 mcg into inhaler and inhale daily.         . traZODone (DESYREL) 50 MG tablet   Oral  Take 50 mg by mouth at bedtime.         . vitamin E 400 UNIT capsule   Oral   Take 400 Units by mouth daily.          . zoledronic acid (RECLAST) 5 MG/100ML SOLN   Intravenous   Inject 5 mg into the vein once. Every year         . guaiFENesin (MUCINEX) 600 MG 12 hr tablet   Oral   Take 1 tablet (600 mg total) by mouth 2 (two) times daily.   14 tablet   0   . levofloxacin (LEVAQUIN) 750 MG tablet   Oral   Take 750 mg by mouth daily.          . ondansetron (ZOFRAN ODT) 4 MG disintegrating tablet      4mg  ODT q4 hours prn nausea/vomit   12 tablet   0   . oxyCODONE-acetaminophen (PERCOCET/ROXICET) 5-325 MG per tablet   Oral   Take 2 tablets by mouth every 6 (six) hours as needed for pain.   20 tablet   0     BP 144/98  Pulse 78  Temp(Src) 97.7 F (36.5 C) (Oral)  SpO2 97%  Physical Exam  Constitutional: She is oriented to person, place, and time. She appears well-developed.  HENT:  Head: Normocephalic.  Eyes: Conjunctivae and EOM are normal. No scleral icterus.  Neck: Neck supple. No thyromegaly present.  Cardiovascular: Normal rate and regular rhythm.  Exam reveals no gallop and no friction rub.   No murmur heard. Pulmonary/Chest: No stridor. She has no wheezes. She has no rales. She exhibits no tenderness.  Abdominal: She exhibits no distension. There is no tenderness. There is no rebound.  Genitourinary:  Tender right flank  Musculoskeletal: Normal range of motion. She exhibits no edema.  Lymphadenopathy:    She has no cervical adenopathy.  Neurological: She is oriented to person, place, and time. Coordination normal.  Skin: No rash noted. No erythema.  Psychiatric: She has a normal mood and affect. Her behavior is normal.    ED Course  Procedures (including critical care time)  Labs Reviewed  CBC WITH DIFFERENTIAL - Abnormal; Notable for the  following:    WBC 18.2 (*)    Neutrophils Relative % 78 (*)    Neutro Abs 14.3 (*)    Monocytes Absolute 1.4 (*)    All other components within normal limits  COMPREHENSIVE METABOLIC PANEL - Abnormal; Notable for the following:    Sodium 131 (*)    Chloride 95 (*)    GFR calc non Af Amer 54 (*)    GFR calc Af Amer 62 (*)    All other components within normal limits  URINALYSIS, ROUTINE W REFLEX MICROSCOPIC - Abnormal; Notable for the following:    Leukocytes, UA SMALL (*)    All other components within normal limits  URINE MICROSCOPIC-ADD ON - Abnormal; Notable for the following:    Crystals CA OXALATE CRYSTALS (*)    All other components within normal limits   Ct Abdomen Pelvis Wo Contrast  09/25/2012   *RADIOLOGY REPORT*  Clinical Data:  Pain.  CT ABDOMEN AND PELVIS WITHOUT CONTRAST (CT UROGRAM)  Technique: Contiguous axial images of the abdomen and pelvis without oral or intravenous contrast were obtained.  Comparison: 05/29/2011  Findings:  Exam is limited for evaluation of entities other than urinary tract calculi due to lack of oral or intravenous contrast.   Lung bases:  3 mm right  lower lobe lung nodule on image 7/series 4 which is unchanged since 05/29/2011.  A 4 mm right lower lobe lung nodule on image 2 was not imaged on the prior exam and has obscured by airspace disease on the 06/01/2012 exam.  Moderate centrilobular emphysema.  Cardiomegaly.  A small hiatal hernia.  Abdomen/pelvis:  Normal uninfused appearance of the liver, spleen, stomach, pancreas, gallbladder, biliary tract, adrenal glands.  Punctate bilateral renal collecting systems stones, greater right than left.  Moderate right-sided hydroureteronephrosis.  This continues to the level of a right ureterovesicular junction stone which measures 5 mm on image 61.  Mild non aneurysmal dilatation of the infrarenal abdominal aorta at 2.3 cm. No retroperitoneal or retrocrural adenopathy.  Extensive colonic diverticulosis, without  evidence of diverticulitis.  Normal appendix. Normal small bowel without abdominal ascites.  No pelvic adenopathy.    Normal uterus and adnexa, without significant free pelvic fluid. Most apparent on reformatted images is linear calcification within the superior aspect of the urinary bladder on sagittal image 59 and coronal image 26.  Bones/Musculoskeletal:  Moderate osteopenia.  Advanced degenerative disc disease throughout the lumbosacral spine.  IMPRESSION: 1.  Obstructive 5 mm right ureter vesicular junction calculus. 2.  Bilateral renal calculi. 3.  Subtle linear plaque-like calcification involving the superior aspect of the urinary bladder.  Cannot exclude calcified neoplasm in this area.  Recommend non emergent urology consultation and possible cystoscopy versus dedicated pre and postcontrast CT. 4. Centrilobular emphysema with lung base nodules.  Consider follow- up with chest CT at 6 months.  These results will be called to the ordering clinician or representative by the Radiologist Assistant, and communication documented in the PACS Dashboard.   Original Report Authenticated By: Jeronimo Greaves, M.D.   Dg Chest 2 View  09/24/2012   *RADIOLOGY REPORT*  Clinical Data: Leukocytosis.  Current history of COPD.  CHEST - 2 VIEW  Comparison: Two-view chest x-ray 09/19/2012, 07/29/2012, 07/03/2009.  Findings: Cardiac silhouette mildly enlarged, unchanged.  Thoracic aorta atherosclerotic, unchanged.  Hilar and mediastinal contours otherwise unremarkable.  Emphysematous changes in the upper lobes with moderate to marked hyperinflation, unchanged.  Biapical pleuroparenchymal scarring, right greater than left, unchanged.  No new pulmonary parenchymal abnormalities.  No pleural effusions. Visualized bony thorax intact.  IMPRESSION: COPD/emphysema.  No acute cardiopulmonary disease.  Stable mild cardiomegaly without pulmonary edema.   Original Report Authenticated By: Hulan Saas, M.D.     1. Kidney stone        MDM          Benny Lennert, MD 09/25/12 1300

## 2012-09-25 NOTE — ED Notes (Signed)
Pt here for c/o abd pain that radiates to rt side and swelling of feet started this am

## 2012-09-27 ENCOUNTER — Encounter: Payer: Self-pay | Admitting: Internal Medicine

## 2012-09-27 ENCOUNTER — Ambulatory Visit (INDEPENDENT_AMBULATORY_CARE_PROVIDER_SITE_OTHER): Payer: Medicare Other | Admitting: Internal Medicine

## 2012-09-27 VITALS — BP 140/78 | HR 77 | Temp 98.3°F | Wt 102.0 lb

## 2012-09-27 DIAGNOSIS — R1031 Right lower quadrant pain: Secondary | ICD-10-CM

## 2012-09-27 DIAGNOSIS — E559 Vitamin D deficiency, unspecified: Secondary | ICD-10-CM

## 2012-09-27 DIAGNOSIS — N200 Calculus of kidney: Secondary | ICD-10-CM

## 2012-09-27 DIAGNOSIS — J189 Pneumonia, unspecified organism: Secondary | ICD-10-CM

## 2012-09-27 LAB — CBC WITH DIFFERENTIAL/PLATELET
Basophils Absolute: 0 10*3/uL (ref 0.0–0.1)
Eosinophils Absolute: 0 10*3/uL (ref 0.0–0.7)
HCT: 44.3 % (ref 36.0–46.0)
Hemoglobin: 15.1 g/dL — ABNORMAL HIGH (ref 12.0–15.0)
Lymphocytes Relative: 5 % — ABNORMAL LOW (ref 12.0–46.0)
Lymphs Abs: 1 10*3/uL (ref 0.7–4.0)
MCHC: 34.1 g/dL (ref 30.0–36.0)
Monocytes Absolute: 0.3 10*3/uL (ref 0.1–1.0)
Neutro Abs: 18.9 10*3/uL — ABNORMAL HIGH (ref 1.4–7.7)
RDW: 13.5 % (ref 11.5–14.6)

## 2012-09-27 LAB — BASIC METABOLIC PANEL
CO2: 26 mEq/L (ref 19–32)
Calcium: 9.7 mg/dL (ref 8.4–10.5)
Glucose, Bld: 103 mg/dL — ABNORMAL HIGH (ref 70–99)
Sodium: 136 mEq/L (ref 135–145)

## 2012-09-27 NOTE — Progress Notes (Signed)
  Subjective:    Patient ID: Amber Snow, female    DOB: 1932-09-30, 79 y.o.   MRN: [id number]  HPI Her recent hospital records 5/22-5/26/14 related to community-acquired pneumonia with associated COPD exacerbation and hypoxemia were reviewed  Additionally she had emergency room visit 09/25/12 for a new 5 mm stone @  the right ureter/vesicular junction; she has an appointment today with Dr. Brunilda Payor ,Urologist. Her presentation was severe suprapubic and right lower quadrant abdominal pain. This did respond to medications in the emergency room.    Review of Systems She feels she is back to her baseline in reference to her COPD. She remains a nonsmoker. Any sputum production is scant and clear.  She has dull flank pain which exacerbates at night. She has not had dysuria, hematuria or pyuria. The abdominal pain has improved but does persist. She has some constipation which she relates to medications. She denies unexplained weight loss, diarrhea, melena, or rectal bleeding.  She is not having fever, chills, or sweats.   She had pitting edema 5/31. This has resolved for the most part       Objective:   Physical Exam Gen.: Thin but adequately nourished in appearance. Alert, appropriate and cooperative throughout exam. Eyes: No corneal or conjunctival inflammation noted. No icterus. Nose: External nasal exam reveals no deformity or inflammation. Nasal mucosa are pink and moist. No lesions or exudates noted.   Mouth: Oral mucosa and oropharynx reveal no lesions or exudates. Teeth in good repair. Neck: No deformities, masses, or tenderness noted.  Thyroid normal. Lungs: Overall breath sounds are decreased. She has minor dry rales at the bases. No   increased work of breathing. Heart: Normal rate and rhythm. Normal S1 and S2. No gallop, click, or rub. No murmur. Abdomen: Bowel sounds are hyperactive. She is tender over the right lower quadrant to palpation. No masses, organomegaly or hernias  noted.                                Musculoskeletal/extremities: Slight  Clubbing w/o cyanosis or edema noted. Joints normal . Nail health good. Able to lie down & sit up w/o help. Vascular: Carotid, radial artery, dorsalis pedis and  posterior tibial pulses are full and equal. No bruits present. Neurologic: Alert and oriented x3.  Skin: Intact without suspicious lesions or rashes. Lymph: No cervical, axillary lymphadenopathy present. Psych: Mood and affect are normal. Normally interactive                                                                                        Assessment & Plan:  #1 right lower quadrant abdominal pain in the context of a 5 mm stone in the ureter/vesicular border with moderate right hydroureteonephrosis. CT scan did not suggest diverticulitis or appendicitis.  Plan: CBC and differential will be repeated along with renal function. She has Urology consultation today

## 2012-09-27 NOTE — Patient Instructions (Addendum)

## 2012-09-28 ENCOUNTER — Telehealth: Payer: Self-pay | Admitting: Internal Medicine

## 2012-09-28 NOTE — Telephone Encounter (Signed)
Patient was seen yesterday.

## 2012-09-28 NOTE — Telephone Encounter (Signed)
[facility] Triage Call Report Triage Record Num: [id number] Operator: Gypsy Decant Patient Name: Amber Snow Call Date & Time: 09/25/2012 8:43:02AM Patient Phone: [phone] PCP: Marga Melnick Patient Gender: Female PCP Fax : [phone] Patient DOB: 12/23/1932 Practice Name: [facility] Reason for Call: Caller: Gracyn/Patient; PCP: Marga Melnick; CB#: [phone]; Call regarding During the night she started having terrible abdominal pain on left and pain on her right side. Onset 09/25/2012; Afebrile; Emergent s/sx of Flank Pain, Unbearable abdominal/pelvic pain identified. ER advised. Care advice given. Protocol(s) Used: Flank Pain Recommended Outcome per Protocol: See ED Immediately Reason for Outcome: Unbearable abdominal/pelvic pain Care Advice: ~ Another adult should drive. ~ Do not give the patient anything to eat or drink. ~ Do not push on abdomen. Call EMS 911 if signs and symptoms of shock develop (such as unable to stand due to faintness, dizziness, or lightheadedness; new onset of confusion; slow to respond or difficult to awaken; skin is pale, gray, cool, or moist to touch; severe weakness; loss of consciousness). ~ ~ IMMEDIATE ACTION 05/

## 2012-09-30 ENCOUNTER — Ambulatory Visit (INDEPENDENT_AMBULATORY_CARE_PROVIDER_SITE_OTHER): Payer: Medicare Other | Admitting: Internal Medicine

## 2012-09-30 ENCOUNTER — Encounter: Payer: Self-pay | Admitting: Internal Medicine

## 2012-09-30 ENCOUNTER — Telehealth: Payer: Self-pay | Admitting: Internal Medicine

## 2012-09-30 VITALS — BP 112/70 | HR 86 | Temp 98.3°F | Wt 103.0 lb

## 2012-09-30 DIAGNOSIS — R21 Rash and other nonspecific skin eruption: Secondary | ICD-10-CM

## 2012-09-30 DIAGNOSIS — R238 Other skin changes: Secondary | ICD-10-CM

## 2012-09-30 DIAGNOSIS — L8991 Pressure ulcer of unspecified site, stage 1: Secondary | ICD-10-CM

## 2012-09-30 DIAGNOSIS — L89321 Pressure ulcer of left buttock, stage 1: Secondary | ICD-10-CM

## 2012-09-30 DIAGNOSIS — L89899 Pressure ulcer of other site, unspecified stage: Secondary | ICD-10-CM

## 2012-09-30 LAB — RETICULOCYTES: Retic Ct Pct: 0.8 % (ref 0.4–2.3)

## 2012-09-30 MED ORDER — FAMCICLOVIR 500 MG PO TABS: 500.0000 mg | ORAL_TABLET | Freq: Three times a day (TID) | ORAL | Status: DC

## 2012-09-30 NOTE — Telephone Encounter (Signed)
Per Dr.Hopper ok to double book patient for I have reviewed Nicki Reaper ([place]) and Orvan Falconer ([place]), no openings. Patient to be double-booked at 3:00 pm, patient informed she will have to wait 30-45 min due to over-booking, patient verbalizing understanding

## 2012-09-30 NOTE — Patient Instructions (Addendum)
Use a rubber doughnut when sitting for prolonged period time to take pressure off ulcers. Dip gauze in  sterile saline and applied to the lesions twice a day.  The saline can be purchased at the drugstore or you can make your own .Boil cup of salt in a gallon of water. Store mixture  in a clean container.Report Warning  signs as discussed (red streaks, pus, fever, increasing pain).

## 2012-09-30 NOTE — Progress Notes (Signed)
  Subjective:    Patient ID: Amber Snow, female    DOB: 1933/02/08, 79 y.o.   MRN: [id number]  HPI  Her symptoms began 09/28/12 as blisters which burn in the groin on left & perirectal area.She questions inflammation in th right groin. It has been essentially stable and only partially responsive to witch hazel topically.  There've been no new exposures or contacts; but she recently was discharged on hospital on steroid wean.  She does have associated fatigue.  She had the shingles shot within last 2 years.    Review of Systems  She is in the process of straining her urine in attempt to catch the stone which has caused some uretero hydronephrosis She denies fever, chills, or sweats     Objective:   Physical Exam  General appearance : She is thin but appears adequately nourished; she is uncomfortable but in no acute distress   Eyes: No conjunctival inflammation or scleral icterus is present.  Oral exam: Dental hygiene is good; lips and gums are healthy appearing.There is no oropharyngeal erythema or exudate noted.   Heart:  Normal rate and regular rhythm. S1 and S2 normal without gallop,  click, rub . S4 present; grade 1 systolic murmur  Lungs: Mild rhonchi at the bases. Surprisingly good excursion of the thorax .No increased work of breathing.   Abdomen: bowel sounds normal, soft tender in the right lower quadrant w/o masses, organomegaly or hernias noted.  No guarding or rebound   Skin:Warm & dry. Shallow ulcers in the left perineal/buttock area. Mild erythema on the right. Small hemorrhoidal tissues  Lymphatic: No lymphadenopathy is noted about the head, neck, axilla            Assessment & Plan:   #1 shallow ulcers left perineal/buttock area; I am concerned about herpes zoster. The appearance clinically may be altered as she's had the shingles shot.  Plan: See orders and recommendations

## 2012-09-30 NOTE — Telephone Encounter (Signed)
Caller: [place]/Patient; Phone: [phone]; Reason for Call: Patient calling about lab results; states has question about white count, 20, which seems high to her, "especially when I feel so poorly.  " Also concerned about neutrophil count.  States has seen Dr.  Brunilda Payor, and feels no better.  States there are pustules noted to her pelvic area, which Dr.  Alwyn Ren wanted her seen for if appeared.  Afebrile.  Declines new triage.  Advised appt; no appts available in Epic for 09/30/12.  Info to office for provider review/appt workin/callback.  May reach patient [phone].  Krs/can

## 2012-09-30 NOTE — Addendum Note (Signed)
Addended by: Silvio Pate D on: 09/30/2012 05:17 PM   Modules accepted: Orders

## 2012-10-01 ENCOUNTER — Encounter: Payer: Self-pay | Admitting: *Deleted

## 2012-10-01 LAB — CBC WITH DIFFERENTIAL/PLATELET
Basophils Absolute: 0 10*3/uL (ref 0.0–0.1)
HCT: 40.2 % (ref 36.0–46.0)
Lymphs Abs: 1.3 10*3/uL (ref 0.7–4.0)
MCHC: 33.8 g/dL (ref 30.0–36.0)
MCV: 94.4 fl (ref 78.0–100.0)
Monocytes Absolute: 0.5 10*3/uL (ref 0.1–1.0)
Platelets: 285 10*3/uL (ref 150.0–400.0)
RDW: 13.9 % (ref 11.5–14.6)

## 2012-10-04 ENCOUNTER — Ambulatory Visit: Payer: Medicare Other | Admitting: Internal Medicine

## 2012-10-07 ENCOUNTER — Ambulatory Visit (INDEPENDENT_AMBULATORY_CARE_PROVIDER_SITE_OTHER): Payer: Medicare Other | Admitting: Cardiology

## 2012-10-07 ENCOUNTER — Ambulatory Visit (HOSPITAL_COMMUNITY): Payer: Medicare Other | Attending: Cardiology | Admitting: Radiology

## 2012-10-07 ENCOUNTER — Encounter: Payer: Self-pay | Admitting: Cardiology

## 2012-10-07 VITALS — BP 132/74 | HR 102 | Ht 63.0 in | Wt 100.4 lb

## 2012-10-07 DIAGNOSIS — J4489 Other specified chronic obstructive pulmonary disease: Secondary | ICD-10-CM | POA: Insufficient documentation

## 2012-10-07 DIAGNOSIS — R0600 Dyspnea, unspecified: Secondary | ICD-10-CM

## 2012-10-07 DIAGNOSIS — J449 Chronic obstructive pulmonary disease, unspecified: Secondary | ICD-10-CM

## 2012-10-07 DIAGNOSIS — I739 Peripheral vascular disease, unspecified: Secondary | ICD-10-CM | POA: Insufficient documentation

## 2012-10-07 DIAGNOSIS — I447 Left bundle-branch block, unspecified: Secondary | ICD-10-CM

## 2012-10-07 DIAGNOSIS — R0602 Shortness of breath: Secondary | ICD-10-CM

## 2012-10-07 DIAGNOSIS — E785 Hyperlipidemia, unspecified: Secondary | ICD-10-CM | POA: Insufficient documentation

## 2012-10-07 DIAGNOSIS — I428 Other cardiomyopathies: Secondary | ICD-10-CM

## 2012-10-07 DIAGNOSIS — I519 Heart disease, unspecified: Secondary | ICD-10-CM

## 2012-10-07 DIAGNOSIS — Z87891 Personal history of nicotine dependence: Secondary | ICD-10-CM | POA: Insufficient documentation

## 2012-10-07 NOTE — Progress Notes (Signed)
Echocardiogram performed.  

## 2012-10-07 NOTE — Patient Instructions (Signed)
We will call with the results of your echocardiogram  Continue your current therapy  I will plan on seeing you in one year.

## 2012-10-07 NOTE — Progress Notes (Signed)
Amber Snow Date of Birth: [birth year] Medical Record #[id number]  History of Present Illness: Amber Snow is seen back today for a followup visit. She has a history of nonischemic cardiomyopathy with ejection fraction of 35-40%. Prior cardiac catheterization showed minimal nonobstructive disease.  She has chronic dyspnea related predominantly to COPD. She reports she has quit smoking. She was admitted in May with pneumonia and COPD exacerbation. Since that hospitalization she is also passed a kidney stone and had an episode of shingles involving her buttocks. Her blood pressure has been stable. She denies any significant edema. She's had no palpitations or chest pain. Her shortness of breath is back to her baseline.  Current Outpatient Prescriptions on File Prior to Visit  Medication Sig Dispense Refill  . albuterol (PROVENTIL HFA;VENTOLIN HFA) 108 (90 BASE) MCG/ACT inhaler Inhale 2 puffs into the lungs every 6 (six) hours as needed. Shortness of breath      . Alum & Mag Hydroxide-Simeth (MAGIC MOUTHWASH) SOLN Take 5 mLs by mouth 3 (three) times daily. Gargle well and swallowing  90 mL  0  . Ascorbic Acid (VITAMIN C) 1000 MG tablet Take 1,000 mg by mouth daily.       Marland Kitchen BIOTIN PO Take 1 capsule by mouth daily.      . budesonide-formoterol (SYMBICORT) 160-4.5 MCG/ACT inhaler Inhale 2 puffs into the lungs 2 (two) times daily.  1 Inhaler  3  . Calcium Carbonate (CALTRATE 600) 1500 MG TABS Take 2.5 tablets by mouth daily.       . Cholecalciferol (VITAMIN D) 1000 UNITS capsule Take 1,000 Units by mouth daily.       . famciclovir (FAMVIR) 500 MG tablet Take 1 tablet (500 mg total) by mouth 3 (three) times daily.  21 tablet  0  . ibuprofen (ADVIL,MOTRIN) 200 MG tablet Take 600 mg by mouth every 6 (six) hours as needed for pain.      Marland Kitchen losartan (COZAAR) 50 MG tablet Take 50 mg by mouth at bedtime.      . Multiple Vitamin (MULITIVITAMIN WITH MINERALS) TABS Take 1 tablet by mouth daily.      .  ondansetron (ZOFRAN ODT) 4 MG disintegrating tablet 4mg  ODT q4 hours prn nausea/vomit  12 tablet  0  . oxyCODONE-acetaminophen (PERCOCET/ROXICET) 5-325 MG per tablet Take 2 tablets by mouth every 6 (six) hours as needed for pain.  20 tablet  0  . pravastatin (PRAVACHOL) 40 MG tablet Take 1 tablet (40 mg total) by mouth at bedtime.  90 tablet  3  . predniSONE (DELTASONE) 10 MG tablet Take 40 mg daily for 3 days, then 30 mg daily for 3 days, then 20 mg daily for 3 days, then 10 mg daily for 3 days, then stop.  30 tablet  0  . tiotropium (SPIRIVA) 18 MCG inhalation capsule Place 18 mcg into inhaler and inhale daily.      . traZODone (DESYREL) 50 MG tablet Take 50 mg by mouth at bedtime.      . vitamin E 400 UNIT capsule Take 400 Units by mouth daily.       . zoledronic acid (RECLAST) 5 MG/100ML SOLN Inject 5 mg into the vein once. Every year       No current facility-administered medications on file prior to visit.    Allergies  Allergen Reactions  . Barbiturates     REACTION: rash  . Penicillins Rash  . Shrimp (Shellfish Allergy) Swelling  . Aspirin     REACTION: nausea  .  Beta Adrenergic Blockers     PT STATES TOLD TO AVOID DUE TO COUGH REACTION TO CARDIZEM  . Calcium Channel Blockers     PER PT TOLD TO AVOID DUE TO COUGH REACTION TO CARDIZEM  . Celecoxib     REACTION: nausea  . Codeine     REACTION: excessive sleep  . Diltiazem Hcl Other (See Comments)    REACTION: cough  . Fluvastatin Sodium     REACTION: intolerance  . Ibandronate Sodium Other (See Comments)    REACTION: HAIR LOSS/NAIL CHANGES  . Rosuvastatin     REACTION: intolerance  . Simvastatin     REACTION: intolerance    Past Medical History  Diagnosis Date  . Carotid bruit     Right  . Diverticulosis of colon   . Hyperlipidemia   . History of nephrolithiasis   . Osteopenia   . COPD, moderate   . Cough   . Skipped beats   . Arthritis HANDS  . CAD (coronary artery disease) 09/12/2011    minimal  nonobstructive CAD per cath with EF of 35 to 40% and normal filling pressures and right heart pressures  . LV dysfunction   . Chronic systolic CHF (congestive heart failure)   . 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 HOH (hard of hearing)     bil hearing aids    Past Surgical History  Procedure Laterality Date  . Cosmetic surgery      eyelids  . Left long finger arthroplasty & pulley relase  02-17-2005  . Cysto/ left retrograde pyelogram/ left ureteral stent placement  10-16-2001    STONE  . Pulmonary function analysis  07-31-2009    MODERATE TO SEVERE OBSTRUCTIVE AIRWAY DISEASE/ INSIGNIFICANT RESPONSE TO BRONCHODILATORS/ EMPHYSEMA PATTERN/ MILD DECREASE IN DIFFUSE CAPACITY FEF 25-75 CHANGED BY 8%  . Tubal ligation  AGE 41'S  . Tonsillectomy  CHILD  . Extracorporeal shock wave lithotripsy  2003  . Cataract extraction w/ intraocular lens  implant, bilateral  1998  . Transurethral resection of bladder tumor  07/01/2011    Procedure: TRANSURETHRAL RESECTION OF BLADDER TUMOR (TURBT);  Surgeon: Lindaann Slough, MD;  Location: [facility];  Service: Urology;  Laterality: N/A;  CYSTO  . Cystoscopy  07/01/2011    Procedure: CYSTOSCOPY;  Surgeon: Lindaann Slough, MD;  Location: [facility];  Service: Urology;  Laterality: N/A;  retrieval l of bladder stone  . Orif finger / thumb fracture    . Cystoscopy N/A 08/20/2012    Procedure: CYSTOSCOPY;  Surgeon: Lindaann Slough, MD;  Location: [facility];  Service: Urology;  Laterality: N/A;  fulgeration of bladderm tumors    History  Smoking status  . Former Smoker -- 1.00 packs/day for 50 years  . Types: Cigarettes  . Quit date: 09/27/2011  Smokeless tobacco  . Never Used    Comment: statrted age 31- 67, up to 1ppd for ? years    History  Alcohol Use No    Family History  Problem Relation Age of Onset  . Heart disease Mother   . Hypertension Mother   . Hypertension Father   . Heart attack Father   . Heart  disease Father   . Thyroid disease Sister   . Heart attack Sister   . Stroke Sister   . COPD Sister   . Heart attack Brother   . Cancer Maternal Grandmother     breast cancer  . Diabetes Paternal Grandmother   . Asthma Neg  Hx     Review of Systems: The review of systems is per the HPI.  All other systems were reviewed and are negative.  Physical Exam: BP 132/74  Pulse 102  Ht 5\' 3"  (1.6 m)  Wt 100 lb 6.4 oz (45.541 kg)  BMI 17.79 kg/m2  SpO2 96% Patient is very pleasant and in no acute distress. She is quite thin. Skin is warm and dry. Color is normal.  HEENT is unremarkable. Normocephalic/atraumatic. PERRL. Sclera are nonicteric. Neck is supple. No masses. No JVD. Lungs are clear with diminished breath sounds. Cardiac exam shows a regular rate and rhythm. Abdomen is soft. Extremities are without edema. She has 2+ pulses in her feet. Gait and ROM are intact. No gross neurologic deficits noted.   LABORATORY DATA:  Echocardiogram is pending today.  Assessment / Plan: 1. LV dysfunction with chronic systolic heart failure. Ejection fraction 35-40%. No evidence of volume overload. I think that her symptoms of dyspnea predominantly related to her COPD. prior right heart pressures were normal. We will continue with losartan. She is not a candidate for beta blocker therapy given her COPD. repeat echo cardiac gram results are pending today. I'll plan on followup again in one year.  2. COPD. Patient has a history of chronic tobacco abuse now stopped.  3. Left bundle branch block.

## 2012-10-08 ENCOUNTER — Other Ambulatory Visit: Payer: Self-pay

## 2012-10-08 MED ORDER — BISOPROLOL FUMARATE 5 MG PO TABS: ORAL_TABLET | ORAL | Status: DC

## 2012-10-11 ENCOUNTER — Ambulatory Visit (INDEPENDENT_AMBULATORY_CARE_PROVIDER_SITE_OTHER): Payer: Medicare Other | Admitting: Family Medicine

## 2012-10-11 ENCOUNTER — Encounter: Payer: Self-pay | Admitting: Family Medicine

## 2012-10-11 ENCOUNTER — Ambulatory Visit (INDEPENDENT_AMBULATORY_CARE_PROVIDER_SITE_OTHER)
Admission: RE | Admit: 2012-10-11 | Discharge: 2012-10-11 | Disposition: A | Discharge status: Home / Self Care | Payer: Medicare Other | Source: Ambulatory Visit | Attending: Family Medicine | Admitting: Family Medicine

## 2012-10-11 VITALS — BP 104/60 | HR 97 | Temp 98.7°F | Wt 98.0 lb

## 2012-10-11 DIAGNOSIS — R0989 Other specified symptoms and signs involving the circulatory and respiratory systems: Secondary | ICD-10-CM

## 2012-10-11 DIAGNOSIS — R062 Wheezing: Secondary | ICD-10-CM

## 2012-10-11 DIAGNOSIS — J441 Chronic obstructive pulmonary disease with (acute) exacerbation: Secondary | ICD-10-CM

## 2012-10-11 DIAGNOSIS — R0609 Other forms of dyspnea: Secondary | ICD-10-CM

## 2012-10-11 DIAGNOSIS — J209 Acute bronchitis, unspecified: Secondary | ICD-10-CM

## 2012-10-11 MED ORDER — IPRATROPIUM BROMIDE 0.02 % IN SOLN
0.5000 mg | Freq: Once | RESPIRATORY_TRACT | Status: AC
  Administered 2012-10-11: 0.5 mg via RESPIRATORY_TRACT

## 2012-10-11 MED ORDER — METHYLPREDNISOLONE ACETATE 80 MG/ML IJ SUSP
80.0000 mg | Freq: Once | INTRAMUSCULAR | Status: AC
  Administered 2012-10-11: 80 mg via INTRAMUSCULAR

## 2012-10-11 MED ORDER — LEVOFLOXACIN 500 MG PO TABS: 500.0000 mg | ORAL_TABLET | Freq: Every day | ORAL | Status: DC

## 2012-10-11 MED ORDER — PREDNISONE 10 MG PO TABS: ORAL_TABLET | ORAL | Status: DC

## 2012-10-11 MED ORDER — ALBUTEROL SULFATE (5 MG/ML) 0.5% IN NEBU
2.5000 mg | INHALATION_SOLUTION | Freq: Once | RESPIRATORY_TRACT | Status: AC
  Administered 2012-10-11: 2.5 mg via RESPIRATORY_TRACT

## 2012-10-11 NOTE — Progress Notes (Signed)
  Subjective:     Amber Snow is a 79 y.o. female here for evaluation of a cough. Onset of symptoms was 5 days ago. Symptoms have been gradually worsening since that time. The cough is productive and is aggravated by  any activity and humidity. Associated symptoms include: postnasal drip, shortness of breath, sputum production and wheezing. Patient does have a history of copd.  Patient does not have a history of environmental allergens. Patient has not traveled recently. Patient does have a history of smoking. Patient has not had a previous chest x-ray. Patient has not had a PPD done.  The following portions of the patient's history were reviewed and updated as appropriate: allergies, current medications, past family history, past medical history, past social history, past surgical history and problem list.  Review of Systems Pertinent items are noted in HPI.    Objective:    Oxygen saturation 90% on room air BP 104/60  Pulse 97  Temp(Src) 98.7 F (37.1 C) (Oral)  Wt 98 lb (44.453 kg)  BMI 17.36 kg/m2  SpO2 90% General appearance: alert, cooperative, appears stated age and no distress Ears: normal TM's and external ear canals both ears Nose: Nares normal. Septum midline. Mucosa normal. No drainage or sinus tenderness. Throat: lips, mucosa, and tongue normal; teeth and gums normal Neck: no adenopathy, no carotid bruit, no JVD, supple, symmetrical, trachea midline and thyroid not enlarged, symmetric, no tenderness/mass/nodules Lungs: rales bilaterally and wheezes bilaterally Heart: S1, S2 normal Extremities: extremities normal, atraumatic, no cyanosis or edema    Assessment:    Acute Bronchitis and COPD with exacerbation ---r/o pneumonia   Plan:    Antibiotics per medication orders. B-agonist inhaler. Call if shortness of breath worsens, blood in sputum, change in character of cough, development of fever or chills, inability to maintain nutrition and hydration. Avoid exposure  to tobacco smoke and fumes. Chest x-ray. Steroid inhaler as ordered.  Depo medrol given and steroid taper to start tongiht

## 2012-10-11 NOTE — Progress Notes (Signed)
Quick Note:  Pt scheduled with Wert 10/26/12 ______

## 2012-10-11 NOTE — Patient Instructions (Addendum)

## 2012-10-12 ENCOUNTER — Telehealth: Payer: Self-pay | Admitting: Internal Medicine

## 2012-10-12 NOTE — Telephone Encounter (Signed)
Spoke with patient, patient informed she will need to be seen for this concern, patient has not mentioned this to MD before. Patient states she will call back to schedule an appointment if symptoms do not improve

## 2012-10-12 NOTE — Telephone Encounter (Signed)
Patient Information:  Caller Name: [name]  Phone: [phone]  Patient: Amber Snow  Gender: Female  DOB: [birth year]  Age: 79 Years  PCP: Marga Melnick  Office Follow Up:  Does the office need to follow up with this patient?: Yes  Instructions For The Office: Please review information, wanting Rx called in for RLS  RN Note:  Info from [facility] - RLS Information page.  Symptoms  Reason For Call & Symptoms: Has restless leg syndrome for long time, has been worse for the last 2 days.  Seen in office yesterday 6/16 and Dx pneumonia.  Has urge to move legs and frequently changing positions to try to get relief but does not last long before has to move legs again.   Not so much pain as need to meve legs constantly.  Has heard that there is a medication that can be given for RLS and if possible would like Rx called in.  Reviewed Health History In EMR: Yes  Reviewed Medications In EMR: Yes  Reviewed Allergies In EMR: Yes  Reviewed Surgeries / Procedures: Yes  Date of Onset of Symptoms: 10/10/2012  Guideline(s) Used:  Leg Pain  Disposition Per Guideline:   Home Care  Reason For Disposition Reached:   Leg pain  Advice Given:  Call Back If:  You become worse.  Patient Refused Recommendation:  Patient Requests Prescription  Wanting Rx called in for RLS.  Uses [facility]

## 2012-10-22 ENCOUNTER — Other Ambulatory Visit: Payer: Self-pay

## 2012-10-22 MED ORDER — LOSARTAN POTASSIUM 50 MG PO TABS: 50.0000 mg | ORAL_TABLET | Freq: Every day | ORAL | Status: DC

## 2012-10-22 NOTE — Telephone Encounter (Signed)
losartan (COZAAR) 50 MG tablet  Take 50 mg by mouth at bedtime.  Assessment / Plan:  1. LV dysfunction with chronic systolic heart failure. Ejection fraction 35-40%. No evidence of volume overload. I think that her symptoms of dyspnea predominantly related to her COPD. prior right heart pressures were normal. We will continue with losartan. She is not a candidate for beta blocker therapy given her COPD. repeat echo cardiac gram results are pending today. I'll plan on followup again in one year.

## 2012-10-24 ENCOUNTER — Other Ambulatory Visit: Payer: Self-pay | Admitting: Internal Medicine

## 2012-10-25 NOTE — Telephone Encounter (Signed)
Refill done.  

## 2012-10-26 ENCOUNTER — Encounter: Payer: Self-pay | Admitting: Internal Medicine

## 2012-10-26 ENCOUNTER — Ambulatory Visit (INDEPENDENT_AMBULATORY_CARE_PROVIDER_SITE_OTHER): Payer: Medicare Other | Admitting: Internal Medicine

## 2012-10-26 VITALS — BP 130/68 | HR 65 | Temp 97.0°F | Ht 62.75 in | Wt 99.4 lb

## 2012-10-26 DIAGNOSIS — R911 Solitary pulmonary nodule: Secondary | ICD-10-CM

## 2012-10-26 DIAGNOSIS — J449 Chronic obstructive pulmonary disease, unspecified: Secondary | ICD-10-CM

## 2012-10-26 NOTE — Progress Notes (Signed)
Subjective:    Patient ID: Amber Snow, female    DOB: 06/25/1932  MRN: [id number]  HPI  42 yowf with GOLD II copd by pfts 07/2009 quit smoking June 2013  due to progressive doe x 5 years worse since quit referred to pulmonary clinic 06/17/2012 by Dr Lona Kettle p admit :  Admit date: 06/01/2012  Discharge date: 06/05/2012  Discharge Diagnoses:  Active Problems:  HYPERLIPIDEMIA  LEFT BUNDLE BRANCH BLOCK  COPD  LOW BACK PAIN, CHRONIC  LV dysfunction  Lung nodule > outpt pet rec COPD exacerbation  Nonischemic cardiomyopathy   COPD exacerbation  -CT angiogram of the chest was negative for pulmonary embolus  -Patient was started on Solu-Medrol, Levaquin, and bronchodilators  -Wheezing has improved with Solu-Medrol  -changed to by mouth prednisone without decompensation  -Continue every 6 hour albuterol-And when necessary for shortness of breath  -Continue levofloxacin--adjust Levaquin dose for renal function--final dose to be given on 06/06/2012 to complete 5 days  -I believe that her continuing dyspnea on exertion is a combination of her COPD exacerbation, cardiomyopathy, and deconditioning.  -The patient was subsequently weaned off of oxygen supplementation. Her oxygen saturation was 90-94% with activity on room air.  -She will go home with a prednisone wean starting with 50 mg on day #1 and decreasing by 10 mg daily    06/17/2012 1st pulmonary eval/ Amber Snow s/p hosp in January 2014 cc doe x 50 ft variable improvement with saba =proaire rec Symbicort 2 every 12 hours Spiriva once daily  Only use your albuterol (proaire) as a rescue medication Work on inhaler technique:   07/29/2012  ov/Amber Snow off cigs for a year f/u for copd/ R apical nodule Chief Complaint  Patient presents with  . Follow-up    Pt states breathing is the same, but overall feels better since last visit. Has occ prod cough with beige to clear sputum.    cc improved with less need for proaire and walk at [employer]  still has trouble more than one room vacuum >>set up for CT chest ~11/2012   09/24/2012 Post Hospital follow up  Admitted 5/22 -26 for CAP and COPD exacerbation w/ fever and LLL opcaity.  Tx w/ IV abx, nebs and steroids  Discharged on Levaquin , steroid taper.  She is feeling better but still weak.  Reports is "so-so" w/ dyspnea, occ wheezing, dry hacking cough, chest congestion, general malaise.    rec No change rx  10/26/2012 f/u ov/Amber Snow re copd/ ? Recurrent pna Chief Complaint  Patient presents with  . Follow-up    Pt states her breathing is unchanged since the last visit. She c/o non prod cough no better since the last visit.     sob mailbox and back without stopping and using neb twice daily. Cough is dry and day > noct.  No obvious daytime variabilty or assoc  cp or chest tightness, subjective wheeze overt sinus or hb symptoms. No unusual exp hx or h/o childhood pna/ asthma or knowledge of premature birth.   Sleeping ok without nocturnal  or early am exacerbation  of respiratory  c/o's or need for noct saba. Also denies any obvious fluctuation of symptoms with weather or environmental changes or other aggravating or alleviating factors except as outlined above  Current Medications, Allergies, Past Medical History, Past Surgical History, Family History, and Social History were reviewed in [name] record.  ROS  The following are not active complaints unless bolded sore throat, dysphagia, dental problems, itching,  sneezing,  nasal congestion or excess/ purulent secretions, ear ache,   fever, chills, sweats, unintended wt loss, pleuritic or exertional cp, hemoptysis,  orthopnea pnd or leg swelling, presyncope, palpitations, heartburn, abdominal pain, anorexia, nausea, vomiting, diarrhea  or change in bowel or urinary habits, change in stools or urine, dysuria,hematuria,  rash, arthralgias, visual complaints, headache, numbness weakness or ataxia or problems with  walking or coordination,  change in mood/affect or memory.                Objective:   Physical Exam  07/29/2012  105  >102 09/24/2012 > 10/26/2012  99     HEENT mild turbinate edema.  Oropharynx no thrush or excess pnd or cobblestoning.  No JVD or cervical adenopathy. Mild accessory muscle hypertrophy. Trachea midline, nl thryroid. Chest was hyperinflated by percussion with diminished breath sounds and moderate increased exp time without wheeze. Hoover sign positive at mid inspiration. Regular rate and rhythm without murmur gallop or rub or increase P2 or edema.  Abd: no hsm, nl excursion. Ext warm without cyanosis or clubbing.    CXR   09/24/2012  COPD/emphysema. No acute cardiopulmonary disease. Stable mild  cardiomegaly without pulmonary edema.      Assessment & Plan:

## 2012-10-26 NOTE — Patient Instructions (Addendum)
Plan A = automatic = symbicort and spiriva  PLan B= backup = poraire up to 2 puffs every 4 hours as needed, don't leave home without it  Plan C = nebulizer only use if plan B doesn't work  Plan D= Doctor call if not satisfied with Plan C or having to use a lot   Work on inhaler technique:  relax and gently blow all the way out then take a nice smooth deep breath back in, triggering the inhaler at same time you start breathing in.  Hold for up to 5 seconds if you can.  Rinse and gargle with water when done      Please schedule a follow up office visit in 6 weeks, call sooner if needed with CT chest on return (no contrast needed)

## 2012-10-30 NOTE — Assessment & Plan Note (Signed)
-   Not seen on cxr  03/08/2012 and obvious on cxr 06/01/12 -06/01/12 CT chest . Technically adequate exam showing no evidence for acute  pulmonary embolus.  2. Dependent changes in the lung bases bilaterally, favoring edema  or infectious process.  3. Right apical lung nodule is 1.0 cm in diameter. - placed in tickle file for repeat ct 12/01/12  Technically candidate for resection though would like to see her ex tolerance improve considerably over what she presently reports and consider rehab before and after.  Discussed in detail all the  indications, usual  risks and alternatives  relative to the benefits with patient who agrees to proceed with repeat CT in August 2014 as planned.

## 2012-10-30 NOTE — Assessment & Plan Note (Signed)
-   PFT's 07/31/09 FEV1  1.36 (74%) ratio 45 and no better p B2,  DLCO 66%  - quit smoking June 2013 - PFT's 07/29/2012 FEV1 1.10 (63%) ratio 46 and no change p B2 and DLCO 57%  - HFA 75% p coaching 10/26/12 - 10/26/2012  Walked RA x 3 laps @ 185 ft each stopped due to end of study, no desats  Symptoms of doe to mailbox are not reproducible and disproportionate to objective findings and not clear this is all a lung problem but pt does appear to have difficult airway management issues.   DDX of  difficult airways managment all start with A and  include Adherence, Ace Inhibitors, Acid Reflux, Active Sinus Disease, Alpha 1 Antitripsin deficiency, Anxiety masquerading as Airways dz,  ABPA,  allergy(esp in young), Aspiration (esp in elderly), Adverse effects of DPI,  Active smokers, plus two Bs  = Bronchiectasis and Beta blocker use..and one C= CHF  Adherence is always the initial "prime suspect" and is a multilayered concern that requires a "trust but verify" approach in every patient - starting with knowing how to use medications, especially inhalers, correctly, keeping up with refills and understanding the fundamental difference between maintenance and prns vs those medications only taken for a very short course and then stopped and not refilled.   The proper method of use, as well as anticipated side effects, of a metered-dose inhaler are discussed and demonstrated to the patient. Improved effectiveness after extensive coaching during this visit to a level of approximately  75%    Each maintenance medication was reviewed in detail including most importantly the difference between maintenance and as needed and under what circumstances the prns are to be used.  Please see instructions for details which were reviewed in writing and the patient given a copy.

## 2012-11-21 ENCOUNTER — Other Ambulatory Visit: Payer: Self-pay | Admitting: Internal Medicine

## 2012-11-29 ENCOUNTER — Encounter: Payer: Self-pay | Admitting: Family Medicine

## 2012-12-09 ENCOUNTER — Encounter: Payer: Self-pay | Admitting: Internal Medicine

## 2012-12-09 ENCOUNTER — Ambulatory Visit (INDEPENDENT_AMBULATORY_CARE_PROVIDER_SITE_OTHER)
Admission: RE | Admit: 2012-12-09 | Discharge: 2012-12-09 | Disposition: A | Discharge status: Home / Self Care | Payer: Medicare Other | Source: Ambulatory Visit | Attending: Internal Medicine | Admitting: Internal Medicine

## 2012-12-09 DIAGNOSIS — R911 Solitary pulmonary nodule: Secondary | ICD-10-CM

## 2012-12-10 ENCOUNTER — Ambulatory Visit (INDEPENDENT_AMBULATORY_CARE_PROVIDER_SITE_OTHER): Payer: Medicare Other | Admitting: Internal Medicine

## 2012-12-10 ENCOUNTER — Encounter: Payer: Self-pay | Admitting: Internal Medicine

## 2012-12-10 VITALS — BP 140/90 | HR 60 | Temp 97.0°F | Ht 63.0 in | Wt 98.8 lb

## 2012-12-10 DIAGNOSIS — R911 Solitary pulmonary nodule: Secondary | ICD-10-CM

## 2012-12-10 DIAGNOSIS — J4489 Other specified chronic obstructive pulmonary disease: Secondary | ICD-10-CM

## 2012-12-10 DIAGNOSIS — J449 Chronic obstructive pulmonary disease, unspecified: Secondary | ICD-10-CM

## 2012-12-10 NOTE — Progress Notes (Signed)
Subjective:    Patient ID: Amber Snow, female    DOB: Nov 05, 1932  MRN: [id number]  HPI  26 yowf with GOLD II copd by pfts 07/2009 quit smoking June 2013  due to progressive doe x 5 years worse since quit referred to pulmonary clinic 06/17/2012 by Dr Lona Kettle p admit :  Admit date: 06/01/2012  Discharge date: 06/05/2012  Discharge Diagnoses:  Active Problems:  HYPERLIPIDEMIA  LEFT BUNDLE BRANCH BLOCK  COPD  LOW BACK PAIN, CHRONIC  LV dysfunction  Lung nodule > outpt pet rec COPD exacerbation  Nonischemic cardiomyopathy   COPD exacerbation  -CT angiogram of the chest was negative for pulmonary embolus  -Patient was started on Solu-Medrol, Levaquin, and bronchodilators  -Wheezing has improved with Solu-Medrol  -changed to by mouth prednisone without decompensation  -Continue every 6 hour albuterol-And when necessary for shortness of breath  -Continue levofloxacin--adjust Levaquin dose for renal function--final dose to be given on 06/06/2012 to complete 5 days  -I believe that her continuing dyspnea on exertion is a combination of her COPD exacerbation, cardiomyopathy, and deconditioning.  -The patient was subsequently weaned off of oxygen supplementation. Her oxygen saturation was 90-94% with activity on room air.  -She will go home with a prednisone wean starting with 50 mg on day #1 and decreasing by 10 mg daily    06/17/2012 1st pulmonary eval/ Wert s/p hosp in January 2014 cc doe x 50 ft variable improvement with saba =proaire rec Symbicort 2 every 12 hours Spiriva once daily  Only use your albuterol (proaire) as a rescue medication Work on inhaler technique:   07/29/2012  ov/Wert off cigs for a year f/u for copd/ R apical nodule Chief Complaint  Patient presents with  . Follow-up    Pt states breathing is the same, but overall feels better since last visit. Has occ prod cough with beige to clear sputum.    cc improved with less need for proaire and walk at [employer]  still has trouble more than one room vacuum >>set up for CT chest ~11/2012   09/24/2012 Post Hospital follow up  Admitted 5/22 -26 for CAP and COPD exacerbation w/ fever and LLL opcaity.  Tx w/ IV abx, nebs and steroids  Discharged on Levaquin , steroid taper.  She is feeling better but still weak.  Reports is "so-so" w/ dyspnea, occ wheezing, dry hacking cough, chest congestion, general malaise.    rec No change rx  10/26/2012 f/u ov/Wert re copd/ ? Recurrent pna Chief Complaint  Patient presents with  . Follow-up    Pt states her breathing is unchanged since the last visit. She c/o non prod cough no better since the last visit.     sob mailbox and back without stopping and using neb twice daily. Cough is dry and day > noct. rec Plan A = automatic = symbicort and spiriva PLan B= backup = proaire up to 2 puffs every 4 hours as needed, don't leave home without it Plan C = nebulizer only use if plan B doesn't work Plan D= Doctor call if not satisfied with Plan C or having to use a lot  Work on inhaler technique:   12/10/2012 f/u ov/Wert re copd/ f/u abn ct Chief Complaint  Patient presents with  . Follow-up    Pt states no changes in her breathing. Cough has improved some. No new co's today.  outdoor walking difficult, but can do mall walking since returned from [place]  Cough is rattling > beige,  never  Bloody   No obvious daytime variabilty or assoc  cp or chest tightness, subjective wheeze overt sinus or hb symptoms. No unusual exp hx or h/o childhood pna/ asthma or knowledge of premature birth.   Sleeping ok without nocturnal  or early am exacerbation  of respiratory  c/o's or need for noct saba. Also denies any obvious fluctuation of symptoms with weather or environmental changes or other aggravating or alleviating factors except as outlined above  Current Medications, Allergies, Past Medical History, Past Surgical History, Family History, and Social History were reviewed in  [name] record.  ROS  The following are not active complaints unless bolded sore throat, dysphagia, dental problems, itching, sneezing,  nasal congestion or excess/ purulent secretions, ear ache,   fever, chills, sweats, unintended wt loss, pleuritic or exertional cp, hemoptysis,  orthopnea pnd or leg swelling, presyncope, palpitations, heartburn, abdominal pain, anorexia, nausea, vomiting, diarrhea  or change in bowel or urinary habits, change in stools or urine, dysuria,hematuria,  rash, arthralgias, visual complaints, headache, numbness weakness or ataxia or problems with walking or coordination,  change in mood/affect or memory.                Objective:   Physical Exam  07/29/2012  105  >102 09/24/2012 > 10/26/2012  99 > 12/10/2012 99     HEENT mild turbinate edema.  Oropharynx no thrush or excess pnd or cobblestoning.  No JVD or cervical adenopathy. Mild accessory muscle hypertrophy. Trachea midline, nl thryroid. Chest was hyperinflated by percussion with diminished breath sounds and moderate increased exp time without wheeze. Hoover sign positive at mid inspiration. Regular rate and rhythm without murmur gallop or rub or increase P2 or edema.  Abd: no hsm, nl excursion. Ext warm without cyanosis or clubbing.    CXR   09/24/2012  COPD/emphysema. No acute cardiopulmonary disease. Stable mild  cardiomegaly without pulmonary edema.      Assessment & Plan:

## 2012-12-10 NOTE — Progress Notes (Signed)
Quick Note:  Pt aware- this was discussed at ov today ______

## 2012-12-10 NOTE — Assessment & Plan Note (Addendum)
-   Not seen on cxr  03/08/2012 and obvious on cxr 06/01/12 -06/01/12 CT chest . Technically adequate exam showing no evidence for acute  pulmonary embolus.  2. Dependent changes in the lung bases bilaterally, favoring edema  or infectious process.  3. Right apical lung nodule is 1.0 cm in diameter. - CT 12/09/2012 1. Bullous emphysema with biapical scarring, right greater than left. 2. New spiculated nodular density in the left upper lobe. Follow- up CT chest without contrast is recommended in 3 months.  . 3. Resolved bilateral lower lobe air space disease with residual post infectious scarring. 4. Right nephrolithiasis.    The suspicious R apical lesion is gone and now she has a suspicious L Lung lesion in the setting of moderately severe copd - hopefully whatever the process was in the R apex is repeating itself on the Left, but I told the pt there were no guarantees.   Discussed in detail all the  indications, usual  risks and alternatives  relative to the benefits with patient who agrees to proceed with conservative rx with cxr only at 3 months and go from there.

## 2012-12-10 NOTE — Patient Instructions (Addendum)
Walk indoors up to 30 min daily but pace yourself  Please schedule a follow up visit in 3 months but call sooner if needed with cxr

## 2012-12-13 NOTE — Assessment & Plan Note (Signed)
-   PFT's 07/31/09 FEV1  1.36 (74%) ratio 45 and no better p B2,  DLCO 66%  - quit smoking June 2013 - PFT's 07/29/2012 FEV1 1.10 (63%) ratio 46 and no change p B2 and DLCO 57%  - HFA 75% p coaching 10/26/12 - 10/26/2012  Walked RA x 3 laps @ 185 ft each stopped due to end of study, no desats  Marked improvement and Adequate control on present rx, reviewed > no change in rx needed      Each maintenance medication was reviewed in detail including most importantly the difference between maintenance and as needed and under what circumstances the prns are to be used.  Please see instructions for details which were reviewed in writing and the patient given a copy.

## 2012-12-20 ENCOUNTER — Other Ambulatory Visit: Payer: Self-pay | Admitting: Internal Medicine

## 2012-12-30 ENCOUNTER — Ambulatory Visit (INDEPENDENT_AMBULATORY_CARE_PROVIDER_SITE_OTHER): Payer: Medicare Other | Admitting: Cardiology

## 2012-12-30 ENCOUNTER — Encounter: Payer: Self-pay | Admitting: Cardiology

## 2012-12-30 VITALS — BP 138/70 | HR 76 | Ht 63.0 in | Wt 98.1 lb

## 2012-12-30 DIAGNOSIS — I509 Heart failure, unspecified: Secondary | ICD-10-CM

## 2012-12-30 DIAGNOSIS — I428 Other cardiomyopathies: Secondary | ICD-10-CM

## 2012-12-30 DIAGNOSIS — I5022 Chronic systolic (congestive) heart failure: Secondary | ICD-10-CM

## 2012-12-30 DIAGNOSIS — E785 Hyperlipidemia, unspecified: Secondary | ICD-10-CM

## 2012-12-30 DIAGNOSIS — I447 Left bundle-branch block, unspecified: Secondary | ICD-10-CM

## 2012-12-30 MED ORDER — SPIRONOLACTONE 25 MG PO TABS: 12.5000 mg | ORAL_TABLET | Freq: Every day | ORAL | Status: DC

## 2012-12-30 NOTE — Patient Instructions (Signed)
Increase bisoprolol to 5 mg daily  Start aldactone 12.5 mg daily  I will have you return in one month with lab work.

## 2012-12-30 NOTE — Progress Notes (Signed)
[name] Date of Birth: 03-09-1933 Medical Record #[id number]  History of Present Illness: [name] is seen back today for a followup visit. She has a history of nonischemic cardiomyopathy with ejection fraction of 35-40% by cardiac catheterization and echocardiogram in May of 2013. Nuclear stress test at the same time showed an ejection fraction of 27%. Prior cardiac catheterization showed minimal nonobstructive disease.  She has COPD. She reports she has quit smoking. On her last evaluation in June a repeat echocardiogram showed global hypokinesis with ejection fraction of 25-30%. We have added bisoprolol 2.5 mg daily to her regimen. He is also on losartan. She states her breathing is doing well except when she is out in the hot humid weather. She has no increase in edema. She has been dizziness or syncope. Her weight is actually down 2 pounds.  Current Outpatient Prescriptions on File Prior to Visit  Medication Sig Dispense Refill  . albuterol (PROVENTIL HFA;VENTOLIN HFA) 108 (90 BASE) MCG/ACT inhaler Inhale 2 puffs into the lungs every 6 (six) hours as needed. Shortness of breath      . Ascorbic Acid (VITAMIN C) 1000 MG tablet Take 1,000 mg by mouth daily.       Marland Kitchen BIOTIN PO Take 1 capsule by mouth daily.      . bisoprolol (ZEBETA) 5 MG tablet Take 1/2 tablet daily ( 2.5 mg )  90 tablet  3  . Calcium Carbonate (CALTRATE 600) 1500 MG TABS Take 2.5 tablets by mouth daily.       . Cholecalciferol (VITAMIN D) 1000 UNITS capsule Take 1,000 Units by mouth daily.       Marland Kitchen ibuprofen (ADVIL,MOTRIN) 200 MG tablet Take 600 mg by mouth every 6 (six) hours as needed for pain.      Marland Kitchen ipratropium-albuterol (DUONEB) 0.5-2.5 (3) MG/3ML SOLN Take 3 mLs by nebulization every 6 (six) hours as needed.      Marland Kitchen losartan (COZAAR) 50 MG tablet Take 1 tablet (50 mg total) by mouth at bedtime.  90 tablet  1  . Multiple Vitamin (MULITIVITAMIN WITH MINERALS) TABS Take 1 tablet by mouth daily.      . pravastatin  (PRAVACHOL) 40 MG tablet Take 1 tablet (40 mg total) by mouth at bedtime.  90 tablet  3  . SYMBICORT 160-4.5 MCG/ACT inhaler INHALE 2 PUFFS INTO LUNGS TWICE A DAY  10.2 Inhaler  1  . tiotropium (SPIRIVA) 18 MCG inhalation capsule Place 18 mcg into inhaler and inhale daily.      . traZODone (DESYREL) 50 MG tablet Take 50 mg by mouth at bedtime.      . vitamin E 400 UNIT capsule Take 400 Units by mouth daily.       . zoledronic acid (RECLAST) 5 MG/100ML SOLN Inject 5 mg into the vein once. Every year       No current facility-administered medications on file prior to visit.    Allergies  Allergen Reactions  . Barbiturates     REACTION: rash  . Penicillins Rash  . Shrimp [Shellfish Allergy] Swelling  . Aspirin     REACTION: nausea  . Beta Adrenergic Blockers     PT STATES TOLD TO AVOID DUE TO COUGH REACTION TO CARDIZEM  . Calcium Channel Blockers     PER PT TOLD TO AVOID DUE TO COUGH REACTION TO CARDIZEM  . Celecoxib     REACTION: nausea  . Codeine     REACTION: excessive sleep  . Diltiazem Hcl Other (See Comments)  REACTION: cough  . Fluvastatin Sodium     REACTION: intolerance  . Ibandronate Sodium Other (See Comments)    REACTION: HAIR LOSS/NAIL CHANGES  . Oxycodone-Acetaminophen Nausea Only  . Rosuvastatin     REACTION: intolerance  . Simvastatin     REACTION: intolerance    Past Medical History  Diagnosis Date  . Carotid bruit     Right  . Diverticulosis of colon   . Hyperlipidemia   . History of nephrolithiasis   . Osteopenia   . COPD, moderate   . Cough   . Skipped beats   . Arthritis HANDS  . CAD (coronary artery disease) 09/12/2011    minimal nonobstructive CAD per cath with EF of 35 to 40% and normal filling pressures and right heart pressures  . LV dysfunction   . Chronic systolic CHF (congestive heart failure)   . 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 HOH (hard of hearing)     bil hearing aids    Past Surgical History  Procedure Laterality Date  . Cosmetic  surgery      eyelids  . Left long finger arthroplasty & pulley relase  02-17-2005  . Cysto/ left retrograde pyelogram/ left ureteral stent placement  10-16-2001    STONE  . Pulmonary function analysis  07-31-2009    MODERATE TO SEVERE OBSTRUCTIVE AIRWAY DISEASE/ INSIGNIFICANT RESPONSE TO BRONCHODILATORS/ EMPHYSEMA PATTERN/ MILD DECREASE IN DIFFUSE CAPACITY FEF 25-75 CHANGED BY 8%  . Tubal ligation  AGE 56'S  . Tonsillectomy  CHILD  . Extracorporeal shock wave lithotripsy  2003  . Cataract extraction w/ intraocular lens  implant, bilateral  1998  . Transurethral resection of bladder tumor  07/01/2011    Procedure: TRANSURETHRAL RESECTION OF BLADDER TUMOR (TURBT);  Surgeon: Lindaann Slough, MD;  Location: [facility];  Service: Urology;  Laterality: N/A;  CYSTO  . Cystoscopy  07/01/2011    Procedure: CYSTOSCOPY;  Surgeon: Lindaann Slough, MD;  Location: [facility];  Service: Urology;  Laterality: N/A;  retrieval l of bladder stone  . Orif finger / thumb fracture    . Cystoscopy N/A 08/20/2012    Procedure: CYSTOSCOPY;  Surgeon: Lindaann Slough, MD;  Location[facility];  Service: Urology;  Laterality: N/A;  fulgeration of bladderm tumors    History  Smoking status  . Former Smoker -- 1.00 packs/day for 50 years  . Types: Cigarettes  . Quit date: 09/27/2011  Smokeless tobacco  . Never Used    Comment: statrted age 79- 45, up to 1ppd for ? years    History  Alcohol Use No    Family History  Problem Relation Age of Onset  . Heart disease Mother   . Hypertension Mother   . Hypertension Father   . Heart attack Father   . Heart disease Father   . Thyroid disease Sister   . Heart attack Sister   . Stroke Sister   . COPD Sister   . Heart attack Brother   . Cancer Maternal Grandmother     breast cancer  . Diabetes Paternal Grandmother   . Asthma Neg Hx     Review of Systems: The review of systems is per the HPI.  All other systems  were reviewed and are negative.  Physical Exam: BP 138/70  Pulse 76  Ht 5\' 3"  (1.6 m)  Wt 98 lb 1.9 oz (44.507 kg)  BMI 17.39 kg/m2  SpO2 99% Patient is very pleasant and in no acute  distress. She is quite thin. Skin is warm and dry. Color is normal.  HEENT is unremarkable. Normocephalic/atraumatic. PERRL. Sclera are nonicteric. Neck is supple. No masses. No JVD. Lungs are clear with diminished breath sounds. Cardiac exam shows a regular rate and rhythm. Abdomen is soft. Extremities are without edema. She has 2+ pulses in her feet. Gait and ROM are intact. No gross neurologic deficits noted.   LABORATORY DATA:  Echo:Study Conclusions  - Left ventricle: The cavity size was normal. Wall thickness was normal. Systolic function was severely reduced. The estimated ejection fraction was in the range of 25% to 30%. Septal-lateral dyssynchrony. Diffuse hypokinesis, worse in the septum. Indeterminant diastolic function. - Aortic valve: There was no stenosis. - Mitral valve: Mildly calcified annulus. No significant regurgitation. - Right ventricle: The cavity size was normal. Systolic function was normal. - Pulmonary arteries: No complete TR doppler jet so unable to estimate PA systolic pressure. - Inferior vena cava: The vessel was normal in size; the respirophasic diameter changes were in the normal range (= 50%); findings are consistent with normal central venous pressure. - Pericardium, extracardiac: A trivial pericardial effusion was identified. Impressions:  - Normal LV size with severe systolic dysfunction, EF 25-30%. Septal-lateral dyssynchrony. Global hypokinesis worse in the septum. Normal RV size and systolic function.    Assessment / Plan: 1. Chronic systolic heart failure with nonischemic cardiomyopathy. Ejection fraction 25-30%. No evidence of volume overload. Ejection fraction appears lower than it was on initial evaluation last year. I recommended trying to maximize  her heart failure medications. We will increase her bisoprolol to 5 mg daily. Continued losartan 50 mg daily. Will add Aldactone 12.5 mg daily. I will have her followup again in one month and we'll check a basic metabolic panel at that time. We will titrate her medications as much as tolerated. Once her medications have been optimized we'll repeat an echocardiogram. If her ejection fraction remains less than 35% she will need to be considered for an ICD +/-biventricular pacing. She does have a chronic left bundle branch block.  2. COPD. Patient has a history of chronic tobacco abuse now stopped.  3. Left bundle branch block.

## 2013-01-28 ENCOUNTER — Telehealth: Payer: Self-pay | Admitting: *Deleted

## 2013-01-28 ENCOUNTER — Ambulatory Visit (INDEPENDENT_AMBULATORY_CARE_PROVIDER_SITE_OTHER): Payer: Medicare Other | Admitting: Nurse Practitioner

## 2013-01-28 ENCOUNTER — Other Ambulatory Visit: Payer: Medicare Other

## 2013-01-28 ENCOUNTER — Encounter: Payer: Self-pay | Admitting: Nurse Practitioner

## 2013-01-28 VITALS — BP 140/62 | HR 64 | Ht 63.5 in | Wt 99.8 lb

## 2013-01-28 DIAGNOSIS — I428 Other cardiomyopathies: Secondary | ICD-10-CM

## 2013-01-28 LAB — BASIC METABOLIC PANEL
BUN: 22 mg/dL (ref 6–23)
CO2: 28 mEq/L (ref 19–32)
Calcium: 9.6 mg/dL (ref 8.4–10.5)
Chloride: 104 mEq/L (ref 96–112)
Creatinine, Ser: 0.9 mg/dL (ref 0.4–1.2)
GFR: 68.74 mL/min (ref >60.00)
Glucose, Bld: 94 mg/dL (ref 70–99)
Potassium: 4.2 mEq/L (ref 3.5–5.1)
Sodium: 137 mEq/L (ref 135–145)

## 2013-01-28 MED ORDER — SPIRONOLACTONE 25 MG PO TABS: 25.0000 mg | ORAL_TABLET | Freq: Every day | ORAL | Status: DC

## 2013-01-28 NOTE — Patient Instructions (Addendum)
Stay on your current medicines but increase the Aldactone to 25 mg a day (full tablet)  We will check labs today  I will see you in 3 to 4 weeks with repeat labs (do not need to fast)  Call the [facility] office at [phone] if you have any questions, problems or concerns.

## 2013-01-28 NOTE — Telephone Encounter (Signed)
lmtcb

## 2013-01-28 NOTE — Telephone Encounter (Signed)
Message copied by Christen Butter on Fri Jan 28, 2013  5:02 PM ------      Message from: Sandrea Hughs B      Created: Thu Jul 29, 2012 12:12 PM       Needs ov with cxr ------

## 2013-01-28 NOTE — Progress Notes (Signed)
Red Christians Amber Snow Date of Birth: 06/27/1932 Medical Record #[id number]  History of Present Illness: Ms. Seidenberg is seen back today for a one month check. Seen for Dr. Swaziland. Has a nonischemic CM with EF of 35 to 40% by cath and echo from May of 2013. Nuclear at that time showed an EF of 27%. Echo was updated this past June of 2014 and EF was 25 to 30%. She has had minimal nonobstructive disease. Other issues include COPD, past smoker, HTN, PVD, HLD, and bladder tumors.   Last seen a month ago - Bisoprolol was increased and low dose aldactone was added.   Comes back today. Here alone. Doing ok. Has some shortness of breath - seems stable. No chest pain. Not dizzy or lightheaded. Tolerating her medicines. Notes that she fatigues easily. Tries to watch her salt. No syncope. No swelling. Weight is stable - does not weigh every day.  Current Outpatient Prescriptions  Medication Sig Dispense Refill  . albuterol (PROVENTIL HFA;VENTOLIN HFA) 108 (90 BASE) MCG/ACT inhaler Inhale 2 puffs into the lungs every 6 (six) hours as needed. Shortness of breath      . Ascorbic Acid (VITAMIN C) 1000 MG tablet Take 1,000 mg by mouth daily.       Marland Kitchen BIOTIN PO Take 1 capsule by mouth daily.      . bisoprolol (ZEBETA) 5 MG tablet Take 5 mg by mouth daily.      . Calcium Carbonate (CALTRATE 600) 1500 MG TABS Take 2.5 tablets by mouth daily.       . Cholecalciferol (VITAMIN D) 1000 UNITS capsule Take 1,000 Units by mouth daily.       Marland Kitchen ibuprofen (ADVIL,MOTRIN) 200 MG tablet Take 600 mg by mouth every 6 (six) hours as needed for pain.      Marland Kitchen ipratropium-albuterol (DUONEB) 0.5-2.5 (3) MG/3ML SOLN Take 3 mLs by nebulization every 6 (six) hours as needed.      Marland Kitchen losartan (COZAAR) 50 MG tablet Take 1 tablet (50 mg total) by mouth at bedtime.  90 tablet  1  . Multiple Vitamin (MULITIVITAMIN WITH MINERALS) TABS Take 1 tablet by mouth daily.      . pravastatin (PRAVACHOL) 40 MG tablet Take 1 tablet (40 mg total) by mouth  at bedtime.  90 tablet  3  . spironolactone (ALDACTONE) 25 MG tablet Take 1 tablet (25 mg total) by mouth daily.  90 tablet  3  . SYMBICORT 160-4.5 MCG/ACT inhaler INHALE 2 PUFFS INTO LUNGS TWICE A DAY  10.2 Inhaler  1  . tiotropium (SPIRIVA) 18 MCG inhalation capsule Place 18 mcg into inhaler and inhale daily.      . traZODone (DESYREL) 50 MG tablet Take 50 mg by mouth at bedtime.      . vitamin E 400 UNIT capsule Take 400 Units by mouth daily.       . zoledronic acid (RECLAST) 5 MG/100ML SOLN Inject 5 mg into the vein once. Every year       No current facility-administered medications for this visit.    Allergies  Allergen Reactions  . Barbiturates     REACTION: rash  . Penicillins Rash  . Shrimp [Shellfish Allergy] Swelling  . Aspirin     REACTION: nausea  . Beta Adrenergic Blockers     PT STATES TOLD TO AVOID DUE TO COUGH REACTION TO CARDIZEM  . Calcium Channel Blockers     PER PT TOLD TO AVOID DUE TO COUGH REACTION TO CARDIZEM  .  Celecoxib     REACTION: nausea  . Codeine     REACTION: excessive sleep  . Diltiazem Hcl Other (See Comments)    REACTION: cough  . Fluvastatin Sodium     REACTION: intolerance  . Ibandronate Sodium Other (See Comments)    REACTION: HAIR LOSS/NAIL CHANGES  . Oxycodone-Acetaminophen Nausea Only  . Rosuvastatin     REACTION: intolerance  . Simvastatin     REACTION: intolerance    Past Medical History  Diagnosis Date  . Carotid bruit     Right  . Diverticulosis of colon   . Hyperlipidemia   . History of nephrolithiasis   . Osteopenia   . COPD, moderate   . Cough   . Skipped beats   . Arthritis HANDS  . CAD (coronary artery disease) 09/12/2011    minimal nonobstructive CAD per cath with EF of 35 to 40% and normal filling pressures and right heart pressures  . LV dysfunction   . Chronic systolic CHF (congestive heart failure)   . 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 HOH (hard of hearing)     bil hearing aids    Past Surgical History    Procedure Laterality Date  . Cosmetic surgery      eyelids  . Left long finger arthroplasty & pulley relase  02-17-2005  . Cysto/ left retrograde pyelogram/ left ureteral stent placement  10-16-2001    STONE  . Pulmonary function analysis  07-31-2009    MODERATE TO SEVERE OBSTRUCTIVE AIRWAY DISEASE/ INSIGNIFICANT RESPONSE TO BRONCHODILATORS/ EMPHYSEMA PATTERN/ MILD DECREASE IN DIFFUSE CAPACITY FEF 25-75 CHANGED BY 8%  . Tubal ligation  AGE 68'S  . Tonsillectomy  CHILD  . Extracorporeal shock wave lithotripsy  2003  . Cataract extraction w/ intraocular lens  implant, bilateral  1998  . Transurethral resection of bladder tumor  07/01/2011    Procedure: TRANSURETHRAL RESECTION OF BLADDER TUMOR (TURBT);  Surgeon: Lindaann Slough, MD;  Location: [facility];  Service: Urology;  Laterality: N/A;  CYSTO  . Cystoscopy  07/01/2011    Procedure: CYSTOSCOPY;  Surgeon: Lindaann Slough, MD;  Location: [facility];  Service: Urology;  Laterality: N/A;  retrieval l of bladder stone  . Orif finger / thumb fracture    . Cystoscopy N/A 08/20/2012    Procedure: CYSTOSCOPY;  Surgeon: Lindaann Slough, MD;  Location: [facility];  Service: Urology;  Laterality: N/A;  fulgeration of bladderm tumors    History  Smoking status  . Former Smoker -- 1.00 packs/day for 50 years  . Types: Cigarettes  . Quit date: 09/27/2011  Smokeless tobacco  . Never Used    Comment: statrted age 16- 65, up to 1ppd for ? years    History  Alcohol Use No    Family History  Problem Relation Age of Onset  . Heart disease Mother   . Hypertension Mother   . Hypertension Father   . Heart attack Father   . Heart disease Father   . Thyroid disease Sister   . Heart attack Sister   . Stroke Sister   . COPD Sister   . Heart attack Brother   . Cancer Maternal Grandmother     breast cancer  . Diabetes Paternal Grandmother   . Asthma Neg Hx     Review of Systems: The review of  systems is per the HPI.  All other systems were reviewed and are negative.  Physical Exam: BP 140/62  Pulse 64  Ht 5' 3.5" (1.613 m)  Wt 99 lb 12.8 oz (45.269 kg)  BMI 17.4 kg/m2 Patient is very pleasant and in no acute distress. Weight is stable. Skin is warm and dry. Color is normal.  HEENT is unremarkable. Normocephalic/atraumatic. PERRL. Sclera are nonicteric. Neck is supple. No masses. No JVD. Lungs are clear. Cardiac exam shows a regular rate and rhythm. Abdomen is soft. Extremities are without edema. Gait and ROM are intact. No gross neurologic deficits noted.  LABORATORY DATA: Lab Results  Component Value Date   WBC 12.9 Repeated and verified X2.* 09/30/2012   HGB 13.6 09/30/2012   HCT 40.2 09/30/2012   PLT 285.0 09/30/2012   GLUCOSE 103* 09/27/2012   CHOL 226* 06/14/2012   TRIG 215.0* 06/14/2012   HDL 64.80 06/14/2012   LDLDIRECT 126.5 06/14/2012   LDLCALC 82 01/03/2011   ALT 24 09/25/2012   AST 23 09/25/2012   NA 136 09/27/2012   K 4.2 09/27/2012   CL 96 09/27/2012   CREATININE 0.9 09/27/2012   BUN 15 09/27/2012   CO2 26 09/27/2012   TSH 0.79 06/14/2012   INR 1.0 09/04/2011   HGBA1C 5.8* 06/04/2012   Echo Study Conclusions from June 2014  - Left ventricle: The cavity size was normal. Wall thickness was normal. Systolic function was severely reduced. The estimated ejection fraction was in the range of 25% to 30%. Septal-lateral dyssynchrony. Diffuse hypokinesis, worse in the septum. Indeterminant diastolic function. - Aortic valve: There was no stenosis. - Mitral valve: Mildly calcified annulus. No significant regurgitation. - Right ventricle: The cavity size was normal. Systolic function was normal. - Pulmonary arteries: No complete TR doppler jet so unable to estimate PA systolic pressure. - Inferior vena cava: The vessel was normal in size; the respirophasic diameter changes were in the normal range (= 50%); findings are consistent with normal central venous pressure. - Pericardium,  extracardiac: A trivial pericardial effusion was identified.    Assessment / Plan:  1. Nonischemic CM with chronic systolic heart failure - EF of 25 to 30% - Trying to optimize her medicines and will be considered for ICD +/- BiV - I have increased her Aldactone to a full tablet daily. See her back in 3 to 4 weeks.   2. Chronic LBBB   3. CAD - nonobstructive per last cath - managed medically - no symptoms.   4. COPD - breathing seems stable.   5. Fatigue - probably multifactorial.   Will recheck a BMET today. See her back in 3 to 4 weeks.   Patient is agreeable to this plan and will call if any problems develop in the interim.   Rosalio Macadamia, RN, ANP-C [facility] [address] [place], [place]  [address]

## 2013-01-31 ENCOUNTER — Other Ambulatory Visit: Payer: Self-pay

## 2013-01-31 DIAGNOSIS — Z1231 Encounter for screening mammogram for malignant neoplasm of breast: Secondary | ICD-10-CM

## 2013-02-07 NOTE — Telephone Encounter (Signed)
New Problem    Patient is having sever leg cramps and Nausea last night since she started taking  Simvastatin  Since she increased dosage.    Please give pt a call.     Thanks!

## 2013-02-07 NOTE — Telephone Encounter (Signed)
Called stating she started the increased dose of Aldactone 25 mg on Fri.  Was out of town Sat.  Is calling stating she had some leg cramps Sat nite but last night were severe.  Was up every 2-3 hrs with the cramps.  Also states she was vomiting yesterday.  Today no vomiting just feels weak.  Wt stable. States even today has had some leg cramps.  Spoke w/Lori Gerhardt,NP who suggests that she try to some Tonic water and may hold Aldactone tomorrow but needs to try to continue the 25mg  dose.  Advised her that she may be dehydrated and try to drink fluids and use the tonic water. Advised that her lab work for last OV was all normal.  She has a scheduled app w/Lori on Oct 31 for follow up.  She will call back if symptoms persists.

## 2013-02-15 ENCOUNTER — Other Ambulatory Visit: Payer: Self-pay | Admitting: Internal Medicine

## 2013-02-15 NOTE — Telephone Encounter (Signed)
Trazodone refilled.

## 2013-02-15 NOTE — Telephone Encounter (Signed)
traZODone (DESYREL) 50 MG tablet Last OV: 10/11/2012 Last Refill: 06/01/2012 No Contract on file

## 2013-02-15 NOTE — Telephone Encounter (Signed)
OK X1 

## 2013-02-22 ENCOUNTER — Other Ambulatory Visit: Payer: Self-pay | Admitting: *Deleted

## 2013-02-22 MED ORDER — BISOPROLOL FUMARATE 5 MG PO TABS: 5.0000 mg | ORAL_TABLET | Freq: Every day | ORAL | Status: DC

## 2013-02-24 ENCOUNTER — Ambulatory Visit
Admission: RE | Admit: 2013-02-24 | Discharge: 2013-02-24 | Disposition: A | Discharge status: Home / Self Care | Payer: Medicare Other | Source: Ambulatory Visit

## 2013-02-24 DIAGNOSIS — Z1231 Encounter for screening mammogram for malignant neoplasm of breast: Secondary | ICD-10-CM

## 2013-02-25 ENCOUNTER — Encounter: Payer: Self-pay | Admitting: Nurse Practitioner

## 2013-02-25 ENCOUNTER — Other Ambulatory Visit: Payer: Self-pay | Admitting: *Deleted

## 2013-02-25 ENCOUNTER — Ambulatory Visit (INDEPENDENT_AMBULATORY_CARE_PROVIDER_SITE_OTHER): Payer: Medicare Other | Admitting: Nurse Practitioner

## 2013-02-25 VITALS — BP 110/64 | HR 48 | Ht 63.5 in | Wt 100.0 lb

## 2013-02-25 DIAGNOSIS — I428 Other cardiomyopathies: Secondary | ICD-10-CM

## 2013-02-25 LAB — BASIC METABOLIC PANEL
BUN: 23 mg/dL (ref 6–23)
CO2: 29 mEq/L (ref 19–32)
Calcium: 10.3 mg/dL (ref 8.4–10.5)
Chloride: 99 mEq/L (ref 96–112)
Creatinine, Ser: 0.9 mg/dL (ref 0.4–1.2)
GFR: 64.34 mL/min (ref >60.00)
Glucose, Bld: 90 mg/dL (ref 70–99)
Potassium: 4.4 mEq/L (ref 3.5–5.1)
Sodium: 137 mEq/L (ref 135–145)

## 2013-02-25 MED ORDER — SPIRONOLACTONE 25 MG PO TABS: 25.0000 mg | ORAL_TABLET | Freq: Every day | ORAL | Status: DC

## 2013-02-25 NOTE — Patient Instructions (Addendum)
We need to check lab today  We will get an ultrasound of your heart - based on these results we will decide who to follow up with and when (Dr. Swaziland or see one of the EP doctors)  Stay on your current medicines  Call the [facility] office at [phone] if you have any questions, problems or concerns.

## 2013-02-25 NOTE — Progress Notes (Signed)
Red Christians Hamler Date of Birth: [birth year] Medical Record #[id number]  History of Present Illness: Ms. Goleman is seen back today for a one month check. Seen for Dr. [place]. Has a nonischemic CM with EF of 35 to 40% by cath and echo from May of 2013. Nuclear at that time showed an EF of 27%. Echo was updated this past June of 2014 and EF was 25 to 30%. She has had minimal nonobstructive disease. Other issues include COPD, past smoker, HTN, PVD, HLD, and bladder tumors.   Last seen 2 months ago - Bisoprolol was increased and low dose aldactone was added.   Seen back a month ago and seemed to be doing ok - we increased Aldactone to a full dose.   Comes back today. Here alone. Doing well. Some dizziness at times - no syncope. Notes that her HR and BP are lower at home. No swelling. Breathing is stable. Weight is stable. No chest pain.   Current Outpatient Prescriptions  Medication Sig Dispense Refill  . albuterol (PROVENTIL HFA;VENTOLIN HFA) 108 (90 BASE) MCG/ACT inhaler Inhale 2 puffs into the lungs every 6 (six) hours as needed. Shortness of breath      . Ascorbic Acid (VITAMIN C) 1000 MG tablet Take 1,000 mg by mouth daily.       Marland Kitchen BIOTIN PO Take 1 capsule by mouth daily.      . bisoprolol (ZEBETA) 5 MG tablet Take 1 tablet (5 mg total) by mouth daily.  90 tablet  1  . Calcium Carbonate (CALTRATE 600) 1500 MG TABS Take 2.5 tablets by mouth daily.       . Cholecalciferol (VITAMIN D) 1000 UNITS capsule Take 1,000 Units by mouth daily.       Marland Kitchen ibuprofen (ADVIL,MOTRIN) 200 MG tablet Take 600 mg by mouth every 6 (six) hours as needed for pain.      Marland Kitchen losartan (COZAAR) 50 MG tablet Take 1 tablet (50 mg total) by mouth at bedtime.  90 tablet  1  . Multiple Vitamin (MULITIVITAMIN WITH MINERALS) TABS Take 1 tablet by mouth daily.      . pravastatin (PRAVACHOL) 40 MG tablet Take 1 tablet (40 mg total) by mouth at bedtime.  90 tablet  3  . spironolactone (ALDACTONE) 25 MG tablet Take 1 tablet (25 mg  total) by mouth daily.  90 tablet  3  . SYMBICORT 160-4.5 MCG/ACT inhaler INHALE 2 PUFFS INTO LUNGS TWICE A DAY  10.2 Inhaler  1  . tiotropium (SPIRIVA) 18 MCG inhalation capsule Place 18 mcg into inhaler and inhale daily.      . traZODone (DESYREL) 50 MG tablet TAKE 1 TABLET BY MOUTH EVERY DAY  90 tablet  0  . vitamin E 400 UNIT capsule Take 400 Units by mouth daily.       . zoledronic acid (RECLAST) 5 MG/100ML SOLN Inject 5 mg into the vein once. Every year       No current facility-administered medications for this visit.    Allergies  Allergen Reactions  . Barbiturates     REACTION: rash  . Penicillins Rash  . Shrimp [Shellfish Allergy] Swelling  . Aspirin     REACTION: nausea  . Beta Adrenergic Blockers     PT STATES TOLD TO AVOID DUE TO COUGH REACTION TO CARDIZEM  . Calcium Channel Blockers     PER PT TOLD TO AVOID DUE TO COUGH REACTION TO CARDIZEM  . Celecoxib     REACTION: nausea  . Codeine  REACTION: excessive sleep  . Diltiazem Hcl Other (See Comments)    REACTION: cough  . Fluvastatin Sodium     REACTION: intolerance  . Ibandronate Sodium Other (See Comments)    REACTION: HAIR LOSS/NAIL CHANGES  . Oxycodone-Acetaminophen Nausea Only  . Rosuvastatin     REACTION: intolerance  . Simvastatin     REACTION: intolerance    Past Medical History  Diagnosis Date  . Carotid bruit     Right  . Diverticulosis of colon   . Hyperlipidemia   . History of nephrolithiasis   . Osteopenia   . COPD, moderate   . Cough   . Skipped beats   . Arthritis HANDS  . CAD (coronary artery disease) 09/12/2011    minimal nonobstructive CAD per cath with EF of 35 to 40% and normal filling pressures and right heart pressures  . LV dysfunction   . Chronic systolic CHF (congestive heart failure)   . 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 HOH (hard of hearing)     bil hearing aids    Past Surgical History  Procedure Laterality Date  . Cosmetic surgery      eyelids  . Left long finger  arthroplasty & pulley relase  02-17-2005  . Cysto/ left retrograde pyelogram/ left ureteral stent placement  10-16-2001    STONE  . Pulmonary function analysis  07-31-2009    MODERATE TO SEVERE OBSTRUCTIVE AIRWAY DISEASE/ INSIGNIFICANT RESPONSE TO BRONCHODILATORS/ EMPHYSEMA PATTERN/ MILD DECREASE IN DIFFUSE CAPACITY FEF 25-75 CHANGED BY 8%  . Tubal ligation  AGE 75'S  . Tonsillectomy  CHILD  . Extracorporeal shock wave lithotripsy  2003  . Cataract extraction w/ intraocular lens  implant, bilateral  1998  . Transurethral resection of bladder tumor  07/01/2011    Procedure: TRANSURETHRAL RESECTION OF BLADDER TUMOR (TURBT);  Surgeon: Lindaann Slough, MD;  Location: [facility];  Service: Urology;  Laterality: N/A;  CYSTO  . Cystoscopy  07/01/2011    Procedure: CYSTOSCOPY;  Surgeon: Lindaann Slough, MD;  Location: [facility];  Service: Urology;  Laterality: N/A;  retrieval l of bladder stone  . Orif finger / thumb fracture    . Cystoscopy N/A 08/20/2012    Procedure: CYSTOSCOPY;  Surgeon: Lindaann Slough, MD;  Location: [facility];  Service: Urology;  Laterality: N/A;  fulgeration of bladderm tumors    History  Smoking status  . Former Smoker -- 1.00 packs/day for 50 years  . Types: Cigarettes  . Quit date: 09/27/2011  Smokeless tobacco  . Never Used    Comment: statrted age 41- 72, up to 1ppd for ? years    History  Alcohol Use No    Family History  Problem Relation Age of Onset  . Heart disease Mother   . Hypertension Mother   . Hypertension Father   . Heart attack Father   . Heart disease Father   . Thyroid disease Sister   . Heart attack Sister   . Stroke Sister   . COPD Sister   . Heart attack Brother   . Cancer Maternal Grandmother     breast cancer  . Diabetes Paternal Grandmother   . Asthma Neg Hx     Review of Systems: The review of systems is per the HPI.  All other systems were reviewed and are negative.  Physical  Exam: BP 110/64  Pulse 48  Ht 5' 3.5" (1.613 m)  Wt 100 lb (45.36 kg)  BMI 17.43 kg/m2  Patient is very pleasant and in no acute distress. Quite thin. Skin is warm and dry. Color is normal.  HEENT is unremarkable. Normocephalic/atraumatic. PERRL. Sclera are nonicteric. Neck is supple. No masses. No JVD. Lungs are clear. Cardiac exam shows a regular rate and rhythm. Heart rate by me is 58. Abdomen is soft. Extremities are without edema. Gait and ROM are intact. No gross neurologic deficits noted.  LABORATORY DATA: BMET is pending  Lab Results  Component Value Date   WBC 12.9 Repeated and verified X2.* 09/30/2012   HGB 13.6 09/30/2012   HCT 40.2 09/30/2012   PLT 285.0 09/30/2012   GLUCOSE 94 01/28/2013   CHOL 226* 06/14/2012   TRIG 215.0* 06/14/2012   HDL 64.80 06/14/2012   LDLDIRECT 126.5 06/14/2012   LDLCALC 82 01/03/2011   ALT 24 09/25/2012   AST 23 09/25/2012   NA 137 01/28/2013   K 4.2 01/28/2013   CL 104 01/28/2013   CREATININE 0.9 01/28/2013   BUN 22 01/28/2013   CO2 28 01/28/2013   TSH 0.79 06/14/2012   INR 1.0 09/04/2011   HGBA1C 5.8* 06/04/2012   Echo Study Conclusions from June 2014  - Left ventricle: The cavity size was normal. Wall thickness was normal. Systolic function was severely reduced. The estimated ejection fraction was in the range of 25% to 30%. Septal-lateral dyssynchrony. Diffuse hypokinesis, worse in the septum. Indeterminant diastolic function. - Aortic valve: There was no stenosis. - Mitral valve: Mildly calcified annulus. No significant regurgitation. - Right ventricle: The cavity size was normal. Systolic function was normal. - Pulmonary arteries: No complete TR doppler jet so unable to estimate PA systolic pressure. - Inferior vena cava: The vessel was normal in size; the respirophasic diameter changes were in the normal range (= 50%); findings are consistent with normal central venous pressure. - Pericardium, extracardiac: A trivial pericardial effusion was  identified.  Assessment / Plan:   1. Nonischemic CM with chronic systolic heart failure - EF of 25 to 30% - Trying to optimize her medicines and will be considered for ICD +/- BiV per Dr. Elvis Coil recommendations - She is on a good CHF regimen. I do not think we have room to titrate further given her BP and heart rate. Check BMET today. Repeat her echo. She is stable clinically and looks compensated. Once we see the echo, will decide who she needs to follow up with - Dr. Swaziland or see EP.   2. Chronic LBBB   3. CAD - nonobstructive per last cath - managed medically - no symptoms.   4. COPD - breathing seems stable.   Patient is agreeable to this plan and will call if any problems develop in the interim.   Rosalio Macadamia, RN, ANP-C  [facility]  [address]  [place], [place] [address]

## 2013-03-01 ENCOUNTER — Other Ambulatory Visit: Payer: Self-pay

## 2013-03-01 ENCOUNTER — Other Ambulatory Visit: Payer: Self-pay | Admitting: Internal Medicine

## 2013-03-01 ENCOUNTER — Telehealth: Payer: Self-pay

## 2013-03-01 MED ORDER — SPIRONOLACTONE 25 MG PO TABS: 25.0000 mg | ORAL_TABLET | Freq: Every day | ORAL | Status: DC

## 2013-03-01 NOTE — Telephone Encounter (Signed)
Received message from Murphy patient needs refill for Aldactone.Refill sent to pharmacy.

## 2013-03-02 ENCOUNTER — Ambulatory Visit (HOSPITAL_COMMUNITY): Payer: Medicare Other | Attending: Cardiology | Admitting: Radiology

## 2013-03-02 DIAGNOSIS — I428 Other cardiomyopathies: Secondary | ICD-10-CM | POA: Insufficient documentation

## 2013-03-02 DIAGNOSIS — E785 Hyperlipidemia, unspecified: Secondary | ICD-10-CM | POA: Insufficient documentation

## 2013-03-02 DIAGNOSIS — G609 Hereditary and idiopathic neuropathy, unspecified: Secondary | ICD-10-CM | POA: Insufficient documentation

## 2013-03-02 DIAGNOSIS — I509 Heart failure, unspecified: Secondary | ICD-10-CM

## 2013-03-02 DIAGNOSIS — R011 Cardiac murmur, unspecified: Secondary | ICD-10-CM | POA: Insufficient documentation

## 2013-03-02 DIAGNOSIS — I5022 Chronic systolic (congestive) heart failure: Secondary | ICD-10-CM | POA: Insufficient documentation

## 2013-03-02 DIAGNOSIS — J449 Chronic obstructive pulmonary disease, unspecified: Secondary | ICD-10-CM | POA: Insufficient documentation

## 2013-03-02 DIAGNOSIS — I379 Nonrheumatic pulmonary valve disorder, unspecified: Secondary | ICD-10-CM | POA: Insufficient documentation

## 2013-03-02 DIAGNOSIS — I447 Left bundle-branch block, unspecified: Secondary | ICD-10-CM | POA: Insufficient documentation

## 2013-03-02 DIAGNOSIS — J4489 Other specified chronic obstructive pulmonary disease: Secondary | ICD-10-CM | POA: Insufficient documentation

## 2013-03-02 NOTE — Telephone Encounter (Signed)
symbicort refill sent to pharmacy 

## 2013-03-02 NOTE — Progress Notes (Signed)
Echocardiogram performed.  

## 2013-03-03 NOTE — Progress Notes (Signed)
Quick Note:  Pt aware of great news scheduled a f/u today with Dr. Thomasene Lot 1/13 pt agreeable to plan ______

## 2013-03-14 ENCOUNTER — Other Ambulatory Visit (HOSPITAL_COMMUNITY): Payer: Medicare Other

## 2013-03-14 ENCOUNTER — Ambulatory Visit (INDEPENDENT_AMBULATORY_CARE_PROVIDER_SITE_OTHER): Payer: Medicare Other | Admitting: Internal Medicine

## 2013-03-14 ENCOUNTER — Ambulatory Visit (INDEPENDENT_AMBULATORY_CARE_PROVIDER_SITE_OTHER)
Admission: RE | Admit: 2013-03-14 | Discharge: 2013-03-14 | Disposition: A | Discharge status: Home / Self Care | Payer: Medicare Other | Source: Ambulatory Visit | Attending: Internal Medicine | Admitting: Internal Medicine

## 2013-03-14 ENCOUNTER — Encounter: Payer: Self-pay | Admitting: Internal Medicine

## 2013-03-14 VITALS — BP 112/60 | HR 56 | Temp 97.3°F | Ht 63.0 in | Wt 103.8 lb

## 2013-03-14 DIAGNOSIS — J449 Chronic obstructive pulmonary disease, unspecified: Secondary | ICD-10-CM

## 2013-03-14 DIAGNOSIS — J189 Pneumonia, unspecified organism: Secondary | ICD-10-CM

## 2013-03-14 DIAGNOSIS — R911 Solitary pulmonary nodule: Secondary | ICD-10-CM

## 2013-03-14 NOTE — Patient Instructions (Addendum)
Stop spiriva for now to see what difference this makes  Please see patient coordinator before you leave today  to schedule CT limited to Left upper lobe 06/14/12  And follow up office visit same day

## 2013-03-14 NOTE — Progress Notes (Signed)
Subjective:    Patient ID: Amber Snow, female    DOB: 06-05-1932  MRN: [id number]   Brief patient profile:  56 yowf with GOLD II copd by pfts 07/2009 quit smoking June 2013  due to progressive doe x 5 years worse since quit referred to pulmonary clinic 06/17/2012 by Dr Ignacia Palma p admit :  Admit date: 06/01/2012  Discharge date: 06/05/2012  Discharge Diagnoses:  Active Problems:  HYPERLIPIDEMIA  LEFT BUNDLE BRANCH BLOCK  COPD  LOW BACK PAIN, CHRONIC  LV dysfunction  Lung nodule > outpt pet rec COPD exacerbation  Nonischemic cardiomyopathy   COPD exacerbation  -CT angiogram of the chest was negative for pulmonary embolus  -Patient was started on Solu-Medrol, Levaquin, and bronchodilators  -Wheezing has improved with Solu-Medrol  -changed to by mouth prednisone without decompensation  -Continue every 6 hour albuterol-And when necessary for shortness of breath  -Continue levofloxacin--adjust Levaquin dose for renal function--final dose to be given on 06/06/2012 to complete 5 days  -I believe that her continuing dyspnea on exertion is a combination of her COPD exacerbation, cardiomyopathy, and deconditioning.  -The patient was subsequently weaned off of oxygen supplementation. Her oxygen saturation was 90-94% with activity on room air.  -She will go home with a prednisone wean starting with 50 mg on day #1 and decreasing by 10 mg daily    06/17/2012 1st pulmonary eval/ Amber Snow s/p hosp in January 2014 cc doe x 50 ft variable improvement with saba =proaire rec Symbicort 2 every 12 hours Spiriva once daily  Only use your albuterol (proaire) as a rescue medication Work on inhaler technique:   07/29/2012  ov/Amber Snow off cigs for a year f/u for copd/ R apical nodule Chief Complaint  Patient presents with  . Follow-up    Pt states breathing is the same, but overall feels better since last visit. Has occ prod cough with beige to clear sputum.    cc improved with less need for proaire and  walk at [place] still has trouble more than one room vacuum >>set up for CT chest ~11/2012   09/24/2012 [facility] follow up  Admitted 5/22 -26 for CAP and COPD exacerbation w/ fever and LLL opcaity.  Tx w/ IV abx, nebs and steroids  Discharged on Levaquin , steroid taper.  She is feeling better but still weak.  Reports is "so-so" w/ dyspnea, occ wheezing, dry hacking cough, chest congestion, general malaise.    rec No change rx  10/26/2012 f/u ov/Amber Snow re copd/ ? Recurrent pna Chief Complaint  Patient presents with  . Follow-up    Pt states her breathing is unchanged since the last visit. She c/o non prod cough no better since the last visit.     sob mailbox and back without stopping and using neb twice daily. Cough is dry and day > noct. rec Plan A = automatic = symbicort and spiriva PLan B= backup = proaire up to 2 puffs every 4 hours as needed, don't leave home without it Plan C = nebulizer only use if plan B doesn't work Plan D= Doctor call if not satisfied with Plan C or having to use a lot  Work on inhaler technique:   12/10/2012 f/u ov/Amber Snow re copd/ f/u abn ct Chief Complaint  Patient presents with  . Follow-up    Pt states no changes in her breathing. Cough has improved some. No new co's today.  outdoor walking difficult, but can do mall walking since returned from [place]  Cough is  rattling > beige, never  Bloody  rec No change rx Walk indoors up to 30 min daily but pace yourself   03/14/2013 f/u ov/Amber Snow re: copd GOLD II/ SPN LUL Chief Complaint  Patient presents with  . Followup with cxr    Pt states her breathing seems to be doing well. She denies any new co's today. Pt states she uses rescue inhaler 2-3 times per month on average.     freq cough, choking sensation esp in am assoc with dry mouth and feeling congested but no excess mucus or noct exac- overall much better though and can really tell when misses dose of symbicort Not really limited from desired  activities due to sob on smb and spiriva   No obvious daytime variabilty or assoc  cp or chest tightness, subjective wheeze overt sinus or hb symptoms. No unusual exp hx or h/o childhood pna/ asthma or knowledge of premature birth.   Sleeping ok without nocturnal  or early am exacerbation  of respiratory  c/o's or need for noct saba. Also denies any obvious fluctuation of symptoms with weather or environmental changes or other aggravating or alleviating factors except as outlined above  Current Medications, Allergies, Past Medical History, Past Surgical History, Family History, and Social History were reviewed in [name] record.  ROS  The following are not active complaints unless bolded sore throat, dysphagia, dental problems, itching, sneezing,  nasal congestion or excess/ purulent secretions, ear ache,   fever, chills, sweats, unintended wt loss, pleuritic or exertional cp, hemoptysis,  orthopnea pnd or leg swelling, presyncope, palpitations, heartburn, abdominal pain, anorexia, nausea, vomiting, diarrhea  or change in bowel or urinary habits, change in stools or urine, dysuria,hematuria,  rash, arthralgias, visual complaints, headache, numbness weakness or ataxia or problems with walking or coordination,  change in mood/affect or memory.                Objective:   Physical Exam  07/29/2012  105  >102 09/24/2012 > 10/26/2012  99 > 12/10/2012 99 > 104 03/14/2013     HEENT mild turbinate edema.  Oropharynx no thrush or excess pnd or cobblestoning.  No JVD or cervical adenopathy. Mild accessory muscle hypertrophy. Trachea midline, nl thryroid. Chest was hyperinflated by percussion with diminished breath sounds and moderate increased exp time without wheeze. Hoover sign positive at mid inspiration. Regular rate and rhythm without murmur gallop or rub or increase P2 or edema.  Abd: no hsm, nl excursion. Ext warm without cyanosis or clubbing.    CXR  03/14/2013 :    Somewhat irregular opacity left upper lobe again noted but better seen on CT.      Assessment & Plan:

## 2013-03-14 NOTE — Assessment & Plan Note (Signed)
-   PFT's 07/31/09 FEV1  1.36 (74%) ratio 45 and no better p B2,  DLCO 66%  - quit smoking June 2013 - PFT's 07/29/2012 FEV1 1.10 (63%) ratio 46 and no change p B2 and DLCO 57%  - 10/26/2012  Walked RA x 3 laps @ 185 ft each stopped due to end of study, no desats  Doing better x upper airway symptoms ? Related to spiriva powder > try off  The proper method of use, as well as anticipated side effects, of a metered-dose inhaler are discussed and demonstrated to the patient. Improved effectiveness after extensive coaching during this visit to a level of approximately  90% > continue symbicort

## 2013-03-14 NOTE — Assessment & Plan Note (Signed)
-   Not seen on cxr  03/08/2012 and obvious on cxr 06/01/12 -06/01/12 CT chest . Technically adequate exam showing no evidence for acute  pulmonary embolus.  2. Dependent changes in the lung bases bilaterally, favoring edema  or infectious process.  3. Right apical lung nodule is 1.0 cm in diameter.  Discussed in detail all the  indications, usual  risks and alternatives  relative to the benefits with patient who agrees to proceed with CT limited 3 months - not at all interested in any surgery/ procedures at this point

## 2013-03-17 ENCOUNTER — Telehealth: Payer: Self-pay | Admitting: Cardiology

## 2013-03-17 NOTE — Telephone Encounter (Signed)
Returned call to patient she stated since she has been on Bisoprolol she has noticed a non productive cough,hair falling out,brittle nails.Will check with Dr.Jordan and call her back.

## 2013-03-17 NOTE — Telephone Encounter (Signed)
New message    Started bisoprolol about 72mo ago----pt has noticed that her hair is thinning and fingernails are brittle.  Could this be the medication?

## 2013-03-18 NOTE — Telephone Encounter (Signed)
Returned call to patient no answer.LMTC. 

## 2013-03-21 NOTE — Telephone Encounter (Signed)
°  Patient is returning your call. Please call and advise.  °

## 2013-03-21 NOTE — Telephone Encounter (Signed)
Returned call to patient Dr.Jordan advised Bisoprolol is better to take with COPD.He advised needs to continue to take Bisoprolol.

## 2013-03-30 ENCOUNTER — Other Ambulatory Visit: Payer: Self-pay | Admitting: Urology

## 2013-03-30 ENCOUNTER — Encounter (HOSPITAL_BASED_OUTPATIENT_CLINIC_OR_DEPARTMENT_OTHER): Payer: Self-pay | Admitting: *Deleted

## 2013-03-31 ENCOUNTER — Encounter (HOSPITAL_BASED_OUTPATIENT_CLINIC_OR_DEPARTMENT_OTHER): Payer: Self-pay | Admitting: *Deleted

## 2013-03-31 NOTE — Progress Notes (Addendum)
NPO AFTER MN.  ARRIVE AT 0900. NEEDS ISTAT. CURRENT EKG AND CXR IN EPIC AND CHART. WILL DO SYMBICORT INHALER AM DOS W/ SIPS OF WATER AND BRING RESCUE INHALER.  REVIEWED CHART W/ DR EWELL MDA,  OK TO PROCEED IF DOS MDA ASSESSMENT OK.

## 2013-04-01 ENCOUNTER — Ambulatory Visit (HOSPITAL_COMMUNITY): Payer: Medicare Other

## 2013-04-01 ENCOUNTER — Ambulatory Visit (HOSPITAL_BASED_OUTPATIENT_CLINIC_OR_DEPARTMENT_OTHER)
Admission: RE | Admit: 2013-04-01 | Discharge: 2013-04-01 | Disposition: A | Discharge status: Home / Self Care | Payer: Medicare Other | Source: Ambulatory Visit | Attending: Urology | Admitting: Urology

## 2013-04-01 ENCOUNTER — Encounter (HOSPITAL_BASED_OUTPATIENT_CLINIC_OR_DEPARTMENT_OTHER)
Admission: RE | Disposition: A | Discharge status: Home / Self Care | Payer: Self-pay | Source: Ambulatory Visit | Attending: Urology

## 2013-04-01 ENCOUNTER — Encounter (HOSPITAL_BASED_OUTPATIENT_CLINIC_OR_DEPARTMENT_OTHER): Payer: Medicare Other | Admitting: Anesthesiology

## 2013-04-01 ENCOUNTER — Ambulatory Visit (HOSPITAL_BASED_OUTPATIENT_CLINIC_OR_DEPARTMENT_OTHER): Payer: Medicare Other | Admitting: Anesthesiology

## 2013-04-01 ENCOUNTER — Encounter (HOSPITAL_BASED_OUTPATIENT_CLINIC_OR_DEPARTMENT_OTHER): Payer: Self-pay | Admitting: *Deleted

## 2013-04-01 DIAGNOSIS — I739 Peripheral vascular disease, unspecified: Secondary | ICD-10-CM | POA: Insufficient documentation

## 2013-04-01 DIAGNOSIS — I2510 Atherosclerotic heart disease of native coronary artery without angina pectoris: Secondary | ICD-10-CM | POA: Insufficient documentation

## 2013-04-01 DIAGNOSIS — I509 Heart failure, unspecified: Secondary | ICD-10-CM | POA: Insufficient documentation

## 2013-04-01 DIAGNOSIS — Z87891 Personal history of nicotine dependence: Secondary | ICD-10-CM | POA: Insufficient documentation

## 2013-04-01 DIAGNOSIS — N210 Calculus in bladder: Secondary | ICD-10-CM | POA: Insufficient documentation

## 2013-04-01 DIAGNOSIS — N201 Calculus of ureter: Secondary | ICD-10-CM | POA: Insufficient documentation

## 2013-04-01 DIAGNOSIS — N200 Calculus of kidney: Secondary | ICD-10-CM | POA: Insufficient documentation

## 2013-04-01 DIAGNOSIS — Z87442 Personal history of urinary calculi: Secondary | ICD-10-CM | POA: Insufficient documentation

## 2013-04-01 DIAGNOSIS — J4489 Other specified chronic obstructive pulmonary disease: Secondary | ICD-10-CM | POA: Insufficient documentation

## 2013-04-01 DIAGNOSIS — J449 Chronic obstructive pulmonary disease, unspecified: Secondary | ICD-10-CM | POA: Insufficient documentation

## 2013-04-01 DIAGNOSIS — C679 Malignant neoplasm of bladder, unspecified: Secondary | ICD-10-CM | POA: Insufficient documentation

## 2013-04-01 HISTORY — DX: Myalgia, unspecified site: M79.10

## 2013-04-01 HISTORY — DX: Malignant neoplasm of bladder, unspecified: C67.9

## 2013-04-01 HISTORY — DX: Dyspnea, unspecified: R06.00

## 2013-04-01 HISTORY — DX: Left bundle-branch block, unspecified: I44.7

## 2013-04-01 HISTORY — DX: Other cardiomyopathies: I42.8

## 2013-04-01 HISTORY — DX: Other forms of dyspnea: R06.09

## 2013-04-01 HISTORY — PX: HOLMIUM LASER APPLICATION: SHX5852

## 2013-04-01 HISTORY — PX: CYSTOSCOPY W/ RETROGRADES: SHX1426

## 2013-04-01 HISTORY — DX: Presence of external hearing-aid: Z97.4

## 2013-04-01 HISTORY — DX: Calculus in bladder: N21.0

## 2013-04-01 HISTORY — DX: Solitary pulmonary nodule: R91.1

## 2013-04-01 HISTORY — PX: CYSTOSCOPY WITH LITHOLAPAXY: SHX1425

## 2013-04-01 HISTORY — DX: Atrial premature depolarization: I49.1

## 2013-04-01 LAB — POCT I-STAT 4, (NA,K, GLUC, HGB,HCT)
HCT: 36 % (ref 36.0–46.0)
Hemoglobin: 12.2 g/dL (ref 12.0–15.0)
Potassium: 4.2 mEq/L (ref 3.5–5.1)

## 2013-04-01 SURGERY — CYSTOSCOPY, WITH BLADDER CALCULUS LITHOLAPAXY
Anesthesia: General | Site: Ureter | Laterality: Right

## 2013-04-01 MED ORDER — PROMETHAZINE HCL 25 MG/ML IJ SOLN
6.2500 mg | INTRAMUSCULAR | Status: DC | PRN
  Filled 2013-04-01: qty 1

## 2013-04-01 MED ORDER — FENTANYL CITRATE 0.05 MG/ML IJ SOLN
INTRAMUSCULAR | Status: AC
  Filled 2013-04-01: qty 4

## 2013-04-01 MED ORDER — SODIUM CHLORIDE 0.9 % IR SOLN
Status: DC | PRN
  Administered 2013-04-01: 8000 mL

## 2013-04-01 MED ORDER — ACETAMINOPHEN 10 MG/ML IV SOLN
INTRAVENOUS | Status: DC | PRN
  Administered 2013-04-01: 1000 mg via INTRAVENOUS

## 2013-04-01 MED ORDER — KETOROLAC TROMETHAMINE 30 MG/ML IJ SOLN
INTRAMUSCULAR | Status: DC | PRN
  Administered 2013-04-01: 15 mg via INTRAVENOUS

## 2013-04-01 MED ORDER — LACTATED RINGERS IV SOLN
INTRAVENOUS | Status: DC
  Filled 2013-04-01: qty 1000

## 2013-04-01 MED ORDER — FENTANYL CITRATE 0.05 MG/ML IJ SOLN
25.0000 ug | INTRAMUSCULAR | Status: DC | PRN
  Filled 2013-04-01: qty 1

## 2013-04-01 MED ORDER — ACETAMINOPHEN 10 MG/ML IV SOLN
1000.0000 mg | Freq: Once | INTRAVENOUS | Status: DC
  Filled 2013-04-01 (×2): qty 100

## 2013-04-01 MED ORDER — LIDOCAINE HCL (CARDIAC) 20 MG/ML IV SOLN
INTRAVENOUS | Status: DC | PRN
  Administered 2013-04-01: 40 mg via INTRAVENOUS

## 2013-04-01 MED ORDER — PROPOFOL 10 MG/ML IV BOLUS
INTRAVENOUS | Status: DC | PRN
  Administered 2013-04-01: 120 mg via INTRAVENOUS

## 2013-04-01 MED ORDER — CIPROFLOXACIN IN D5W 400 MG/200ML IV SOLN
400.0000 mg | Freq: Two times a day (BID) | INTRAVENOUS | Status: DC
  Filled 2013-04-01: qty 200

## 2013-04-01 MED ORDER — FENTANYL CITRATE 0.05 MG/ML IJ SOLN
INTRAMUSCULAR | Status: DC | PRN
  Administered 2013-04-01 (×8): 12.5 ug via INTRAVENOUS

## 2013-04-01 MED ORDER — ONDANSETRON HCL 4 MG/2ML IJ SOLN
INTRAMUSCULAR | Status: DC | PRN
  Administered 2013-04-01: 4 mg via INTRAVENOUS

## 2013-04-01 MED ORDER — CIPROFLOXACIN IN D5W 400 MG/200ML IV SOLN
INTRAVENOUS | Status: AC
  Filled 2013-04-01: qty 200

## 2013-04-01 MED ORDER — LACTATED RINGERS IV SOLN
INTRAVENOUS | Status: DC
  Administered 2013-04-01: 10:00:00 via INTRAVENOUS
  Filled 2013-04-01: qty 1000

## 2013-04-01 MED ORDER — CIPROFLOXACIN IN D5W 400 MG/200ML IV SOLN
400.0000 mg | Freq: Two times a day (BID) | INTRAVENOUS | Status: DC
  Administered 2013-04-01: 400 mg via INTRAVENOUS
  Filled 2013-04-01: qty 200

## 2013-04-01 MED ORDER — LACTATED RINGERS IV SOLN
INTRAVENOUS | Status: DC | PRN
  Administered 2013-04-01: 10:00:00 via INTRAVENOUS

## 2013-04-01 SURGICAL SUPPLY — 43 items
ADAPTER CATH URET PLST 4-6FR (CATHETERS) IMPLANT
ADPR CATH URET STRL DISP 4-6FR (CATHETERS)
BAG DRAIN URO-CYSTO SKYTR STRL (DRAIN) ×1 IMPLANT
BAG DRN ANRFLXCHMBR STRAP LEK (BAG) ×2
BAG DRN UROCATH (DRAIN) ×2
BAG URINE LEG 19OZ MD ST LTX (BAG) ×1 IMPLANT
BASKET LASER NITINOL 1.9FR (BASKET) IMPLANT
BASKET STNLS GEMINI 4WIRE 3FR (BASKET) IMPLANT
BASKET ZERO TIP NITINOL 2.4FR (BASKET) ×1 IMPLANT
BRUSH URET BIOPSY 3F (UROLOGICAL SUPPLIES) IMPLANT
BSKT STON RTRVL 120 1.9FR (BASKET)
BSKT STON RTRVL GEM 120X11 3FR (BASKET)
BSKT STON RTRVL ZERO TP 2.4FR (BASKET) ×2
CANISTER SUCT LVC 12 LTR MEDI- (MISCELLANEOUS) ×1 IMPLANT
CATH FOLEY 2WAY SLVR  5CC 18FR (CATHETERS) ×1
CATH FOLEY 2WAY SLVR 5CC 18FR (CATHETERS) IMPLANT
CATH INTERMIT  6FR 70CM (CATHETERS) IMPLANT
CATH URET 5FR 28IN CONE TIP (BALLOONS) ×1
CATH URET 5FR 28IN OPEN ENDED (CATHETERS) IMPLANT
CATH URET 5FR 70CM CONE TIP (BALLOONS) IMPLANT
ELECT LOOP HF 26F 30D .35MM (CUTTING LOOP) ×1 IMPLANT
FIBER LASER FLEXIVA 1000 (UROLOGICAL SUPPLIES) IMPLANT
FIBER LASER FLEXIVA 200 (UROLOGICAL SUPPLIES) IMPLANT
FIBER LASER FLEXIVA 365 (UROLOGICAL SUPPLIES) IMPLANT
FIBER LASER FLEXIVA 550 (UROLOGICAL SUPPLIES) ×1 IMPLANT
GLOVE BIOGEL M 7.0 STRL (GLOVE) ×2 IMPLANT
GLOVE BIOGEL PI IND STRL 7.5 (GLOVE) IMPLANT
GLOVE BIOGEL PI INDICATOR 7.5 (GLOVE) ×3
GOWN STRL NON-REIN LRG LVL3 (GOWN DISPOSABLE) ×3 IMPLANT
GUIDEWIRE 0.038 PTFE COATED (WIRE) IMPLANT
GUIDEWIRE ANG ZIPWIRE 038X150 (WIRE) IMPLANT
GUIDEWIRE STR DUAL SENSOR (WIRE) ×1 IMPLANT
IV NS 1000ML (IV SOLUTION) ×6
IV NS 1000ML BAXH (IV SOLUTION) IMPLANT
IV NS IRRIG 3000ML ARTHROMATIC (IV SOLUTION) ×2 IMPLANT
KIT BALLIN UROMAX 15FX10 (LABEL) IMPLANT
KIT BALLN UROMAX 15FX4 (MISCELLANEOUS) IMPLANT
KIT BALLN UROMAX 26 75X4 (MISCELLANEOUS)
SET HIGH PRES BAL DIL (LABEL)
SHEATH URET ACCESS 12FR/35CM (UROLOGICAL SUPPLIES) IMPLANT
SHEATH URET ACCESS 12FR/55CM (UROLOGICAL SUPPLIES) IMPLANT
SYRINGE 10CC LL (SYRINGE) ×1 IMPLANT
SYRINGE IRR TOOMEY STRL 70CC (SYRINGE) ×1 IMPLANT

## 2013-04-01 NOTE — H&P (Signed)
History of Present Illness Amber Snow passed 2 stones since her last visit. She is known to have bilateral non obstructing renal calculi and a bladder stone at the site of fulguration of bladder tumor. She is on maintenance BCG. She voids well. Urinalysis shows 0-2 WBC's, TNTC RBC's, rare bacteria. Cystoscopy today shows no recurrent bladder tumor and a stone that is adherent to the bladder mucosa.   Past Medical History Problems  1. History of Calculus of ureter (592.1) 2. History of kidney stones (V13.01)  Surgical History Problems  1. History of Cataract Surgery 2. History of Cystoscopy With Fulguration Medium Lesion (2-5cm) 3. History of Cystoscopy With Fulguration Minor Lesion (Under 5mm) 4. History of Hand Surgery 5. History of Lithotripsy 6. History of Tonsillectomy  Current Meds 1. Baclofen 10 MG Oral Tablet;  Therapy: 30Sep2013 to Recorded 2. Binosto 70 MG Oral Tablet Effervescent;  Therapy: 15Nov2013 to Recorded 3. Bisoprolol Fumarate 5 MG Oral Tablet;  Therapy: (Recorded:01Dec2014) to Recorded 4. Caltrate 600+D TABS;  Therapy: (Recorded:11Mar2010) to Recorded 5. Hydrochlorothiazide 25 MG Oral Tablet; TAKE 1 TABLET BY MOUTH   EVERY DAY;  Therapy: 23Oct2009 to (Last Rx:23Oct2009) Ordered 6. Ibuprofen 800 MG Oral Tablet;  Therapy: 17Nov2014 to Recorded 7. Losartan Potassium 50 MG Oral Tablet;  Therapy: (Recorded:01Dec2014) to Recorded 8. Pravastatin Sodium 40 MG Oral Tablet;  Therapy: 09Oct2012 to Recorded 9. ProAir HFA 108 (90 Base) MCG/ACT Inhalation Aerosol Solution;  Therapy: 22Aug2012 to Recorded 10. Spiriva HandiHaler 18 MCG Inhalation Capsule;   Therapy: 24Oct2012 to Recorded 11. Spironolactone 25 MG Oral Tablet;   Therapy: (Recorded:01Dec2014) to Recorded 12. TiZANidine HCl - 4 MG Oral Tablet;   Therapy: 27Dec2013 to Recorded 13. TraZODone HCl - 50 MG Oral Tablet;   Therapy: 21Jan2013 to Recorded  Allergies Medication  1. Boniva KIT 2.  Penicillins  Family History Problems  1. Family history of Family Health Status Number Of Children   1 son 2. Family history of Father Deceased At Age ____ 3. Family history of Heart Disease (V17.49) : Father 4. Family history of Hypertension (V17.49) : Mother 5. Family history of Mother Deceased At Age ____ 6. Family history of Nephrolithiasis  Social History Problems  1. Denied: History of Alcohol Use 2. Caffeine Use   3 to 4 cups 3. Former smoker (V15.82) 4. Marital History - Currently Married 5. Tobacco use (305.1)  Review of Systems Genitourinary, constitutional, skin, eye, otolaryngeal, hematologic/lymphatic, cardiovascular, pulmonary, endocrine, musculoskeletal, gastrointestinal, neurological and psychiatric system(s) were reviewed and pertinent findings if present are noted.    Vitals Vital Signs [Data Includes: Last 1 Day]  Recorded: 01Dec2014 10:57AM  Blood Pressure: 134 / 69 Heart Rate: 68 Respiration: 18  Physical Exam Constitutional: Well nourished and well developed . No acute distress.  ENT:. The ears and nose are normal in appearance.  Neck: The appearance of the neck is normal and no neck mass is present.  Pulmonary: No respiratory distress and normal respiratory rhythm and effort.  Cardiovascular: Heart rate and rhythm are normal . No peripheral edema.  Abdomen: The abdomen is soft and nontender. No masses are palpated. No CVA tenderness. No hernias are palpable. No hepatosplenomegaly noted.  Genitourinary:  Chaperone Present: .  Examination of the external genitalia shows normal female external genitalia and no lesions. The urethra is normal in appearance and not tender. There is no urethral mass. Vaginal exam demonstrates no abnormalities. The adnexa are palpably normal. The bladder is non tender and not distended. The anus is normal on inspection.  The perineum is normal on inspection.  Lymphatics: The femoral and inguinal nodes are not enlarged or  tender.  Skin: Normal skin turgor, no visible rash and no visible skin lesions.  Neuro/Psych:. Mood and affect are appropriate.    Results/Data Urine [Data Includes: Last 1 Day]   01Dec2014  COLOR YELLOW   APPEARANCE CLEAR   SPECIFIC GRAVITY 1.020   pH 6.0   GLUCOSE NEG mg/dL  BILIRUBIN NEG   KETONE NEG mg/dL  BLOOD LARGE   PROTEIN TRACE mg/dL  UROBILINOGEN 0.2 mg/dL  NITRITE NEG   LEUKOCYTE ESTERASE SMALL   SQUAMOUS EPITHELIAL/HPF RARE   WBC 0-2 WBC/hpf  RBC TNTC RBC/hpf  BACTERIA RARE   CRYSTALS NONE SEEN   CASTS NONE SEEN    Procedure  Procedure: Cystoscopy  Chaperone Present: Sima Matas.  Indication: History of Urothelial Carcinoma.  Informed Consent: Risks, benefits, and potential adverse events were discussed and informed consent was obtained from the patient . Specific risks including, but not limited to bleeding, infection, pain, allergic reaction etc. were explained.  Prep: The patient was prepped with betadine.  Anesthesia:. Local anesthesia was administered intraurethrally with 2% lidocaine jelly.  Antibiotic prophylaxis: Ciprofloxacin.  Procedure Note:  Urethral meatus:. No abnormalities.  Anterior urethra: No abnormalities.  Bladder: Visulization was clear. The ureteral orifices were in the normal anatomic position bilaterally. A single  a stone was present in the bladder.    Assessment Assessed  1. Bladder cancer (188.9) 2. Bladder calculus (594.1)  Plan Health Maintenance  1. UA With REFLEX; Status:Resulted - Requires Verification;   Done: 01Dec2014 10:06AM  Cipro 250 mgm x 1. Send stones for composition analysis. Holmium laser bladder calculus. The procedure, risks, benefits were discussed with the patient. The risks include but are not limited to hemorrhage, infection, bladder injury. She understands and wishes to proceed. She will need surveillance cystoscopy in about 3 months and maintenance BCG in 6 months. That will be her last maintenance dose.

## 2013-04-01 NOTE — Transfer of Care (Signed)
Immediate Anesthesia Transfer of Care Note  Patient: Sintia A Bertz  Procedure(s) Performed: Procedure(s) (LRB): CYSTOSCOPY WITH HOLMIUM LASER OF  BLADDER CALCULUS (N/A) HOLMIUM LASER APPLICATION (N/A) CYSTOSCOPY WITH RETROGRADE PYELOGRAM (Right)  Patient Location: PACU  Anesthesia Type: General  Level of Consciousness: awake, sedated, patient cooperative and responds to stimulation  Airway & Oxygen Therapy: Patient Spontanous Breathing and Patient connected to face mask oxygen  Post-op Assessment: Report given to PACU RN, Post -op Vital signs reviewed and stable and Patient moving all extremities  Post vital signs: Reviewed and stable  Complications: No apparent anesthesia complications

## 2013-04-01 NOTE — Anesthesia Preprocedure Evaluation (Addendum)
Anesthesia Evaluation  Patient identified by MRN, date of birth, ID band Patient awake    Reviewed: Allergy & Precautions, H&P , NPO status , Patient's Chart, lab work & pertinent test results  Airway Mallampati: II TM Distance: >3 FB Neck ROM: Full    Dental no notable dental hx. (+) Dental Advisory Given, Teeth Intact and Caps   Pulmonary shortness of breath and with exertion, pneumonia -, COPD COPD inhaler, former smoker,  Seen by Dr. Sherene Sires on 08-01-12. Doing better since February admission for COPD exacerbation.  Can shop, do housework without too much SOB breath sounds clear to auscultation  Pulmonary exam normal       Cardiovascular Pt. on home beta blockers + CAD, + Peripheral Vascular Disease and +CHF + dysrhythmias + Valvular Problems/Murmurs Rhythm:Regular Rate:Normal + Systolic murmurs [id number] nonobstructive CAD per cath with EF of 35 to 40% and normal filling pressures and right heart pressures   Neuro/Psych Low back pain, chronic myalgia and myositis, fatigue.  Neuromuscular disease negative psych ROS   GI/Hepatic negative GI ROS, Neg liver ROS,   Endo/Other  negative endocrine ROS  Renal/GU Renal diseaseBladder tumor.  negative genitourinary   Musculoskeletal negative musculoskeletal ROS (+)   Abdominal   Peds  Hematology negative hematology ROS (+)   Anesthesia Other Findings Note patient taking Beta blocker; she is unaware of allergy to Beta blockers.  Reproductive/Obstetrics negative OB ROS                        Anesthesia Physical Anesthesia Plan  ASA: III  Anesthesia Plan: General   Post-op Pain Management:    Induction:   Airway Management Planned: LMA  Additional Equipment:   Intra-op Plan:   Post-operative Plan: Extubation in OR  Informed Consent: I have reviewed the patients History and Physical, chart, labs and discussed the procedure including the  risks, benefits and alternatives for the proposed anesthesia with the patient or authorized representative who has indicated his/her understanding and acceptance.   Dental advisory given  Plan Discussed with: CRNA  Anesthesia Plan Comments:         Anesthesia Quick Evaluation

## 2013-04-01 NOTE — Op Note (Signed)
JARIYA REICHOW is a 79 y.o.   04/01/2013  General  Preop diagnosis: Bladder calculus.  Postop diagnosis: Bladder calculus, right ureteral stone.  Procedure done: Cystoscopy, right ureteral stone extraction, holmium laser bladder calculus, stone extraction  Surgeon: Wendie Simmer. Sussie Minor  Anesthesia: General  Indication: Patient is a 79 years old female who has a history of for bladder tumor. She had intravesical BCG. Cystoscopy a week ago showed a stone in the bladder. The stone is at the end of the bladder mucosa at the site of resection of previous bladder tumor. She has known bilateral nonobstructing renal calculi. She is scheduled today for bladder calculus extraction.  Procedure: Patient was identified by her wrist band and proper timeout was taken.  Under general anesthesia she was prepped and draped and placed in the dorsolithotomy position. A panendoscope was inserted in the bladder. There is a stone at the dome of the bladder to the right side of the midline that is adherent to the bladder mucosa. There is a stone crowning at the right ureteral orifice. The left ureteral orifice is normal. There is no evidence of tumor in the bladder.  A Nitinol basket was passed through the cystoscope and the left ureteral orifice and the stone was extracted.  With a 550 microfiber holmium laser the bladder calculus was fragmented. I was able to remove the whole stone, care being taken not to injure the bladder mucosa. All stone fragments were then irrigated out of the bladder.  Right retrograde pyelogram:  A cone-tip catheter was passed through the cystoscope and the right ureteral orifice. Contrast was then injected through the cone-tip catheter. The ureter appears normal. There is no evidence of hydronephrosis and there is no evidence of filling defect in the ureter.  There was no evidence of bleeding at the end of the procedure. The cystoscope was then removed. A #18 French Foley catheter was then  inserted in the bladder.  Patient tolerated the procedure well and left the OR in satisfactory condition to postanesthesia care unit.  EBL:   Needles, sponges count:

## 2013-04-01 NOTE — Anesthesia Postprocedure Evaluation (Signed)
Anesthesia Post Note  Patient: Amber Snow  Procedure(s) Performed: Procedure(s) (LRB): CYSTOSCOPY WITH HOLMIUM LASER OF  BLADDER CALCULUS (N/A) HOLMIUM LASER APPLICATION (N/A) CYSTOSCOPY WITH RETROGRADE PYELOGRAM (Right)  Anesthesia type: General  Patient location: PACU  Post pain: Pain level controlled  Post assessment: Post-op Vital signs reviewed  Last Vitals:  Filed Vitals:   04/01/13 1230  BP: 132/49  Pulse: 58  Temp:   Resp: 13    Post vital signs: Reviewed  Level of consciousness: sedated  Complications: No apparent anesthesia complications

## 2013-04-01 NOTE — Anesthesia Procedure Notes (Signed)
Procedure Name: LMA Insertion Date/Time: 04/01/2013 10:47 AM Performed by: Jessica Priest Pre-anesthesia Checklist: Patient identified, Emergency Drugs available, Suction available and Patient being monitored Patient Re-evaluated:Patient Re-evaluated prior to inductionOxygen Delivery Method: Circle System Utilized Preoxygenation: Pre-oxygenation with 100% oxygen Intubation Type: IV induction Ventilation: Mask ventilation without difficulty LMA: LMA inserted LMA Size: 4.0 Number of attempts: 1 Airway Equipment and Method: bite block Placement Confirmation: positive ETCO2 Tube secured with: Tape Dental Injury: Teeth and Oropharynx as per pre-operative assessment

## 2013-04-04 ENCOUNTER — Encounter (HOSPITAL_BASED_OUTPATIENT_CLINIC_OR_DEPARTMENT_OTHER): Payer: Self-pay | Admitting: Urology

## 2013-04-13 ENCOUNTER — Other Ambulatory Visit: Payer: Self-pay | Admitting: Cardiology

## 2013-04-18 ENCOUNTER — Other Ambulatory Visit: Payer: Self-pay | Admitting: Cardiology

## 2013-05-10 ENCOUNTER — Ambulatory Visit (INDEPENDENT_AMBULATORY_CARE_PROVIDER_SITE_OTHER): Payer: Medicare Other | Admitting: Cardiology

## 2013-05-10 ENCOUNTER — Encounter: Payer: Self-pay | Admitting: Cardiology

## 2013-05-10 VITALS — BP 122/68 | HR 60 | Ht 63.0 in | Wt 104.8 lb

## 2013-05-10 DIAGNOSIS — R42 Dizziness and giddiness: Secondary | ICD-10-CM

## 2013-05-10 DIAGNOSIS — I509 Heart failure, unspecified: Secondary | ICD-10-CM

## 2013-05-10 DIAGNOSIS — I428 Other cardiomyopathies: Secondary | ICD-10-CM

## 2013-05-10 DIAGNOSIS — I5022 Chronic systolic (congestive) heart failure: Secondary | ICD-10-CM

## 2013-05-10 DIAGNOSIS — I447 Left bundle-branch block, unspecified: Secondary | ICD-10-CM

## 2013-05-10 LAB — CBC WITH DIFFERENTIAL/PLATELET
Basophils Absolute: 0 10*3/uL (ref 0.0–0.1)
Basophils Relative: 0.3 % (ref 0.0–3.0)
EOS PCT: 1.4 % (ref 0.0–5.0)
Eosinophils Absolute: 0.1 10*3/uL (ref 0.0–0.7)
HEMATOCRIT: 39.2 % (ref 36.0–46.0)
HEMOGLOBIN: 13.3 g/dL (ref 12.0–15.0)
LYMPHS PCT: 25.1 % (ref 12.0–46.0)
Lymphs Abs: 2.2 10*3/uL (ref 0.7–4.0)
MCHC: 34 g/dL (ref 30.0–36.0)
MCV: 94.9 fl (ref 78.0–100.0)
Monocytes Absolute: 0.6 10*3/uL (ref 0.1–1.0)
Monocytes Relative: 7.1 % (ref 3.0–12.0)
NEUTROS ABS: 5.7 10*3/uL (ref 1.4–7.7)
Neutrophils Relative %: 66.1 % (ref 43.0–77.0)
Platelets: 270 10*3/uL (ref 150.0–400.0)
RBC: 4.13 Mil/uL (ref 3.87–5.11)
RDW: 13.2 % (ref 11.5–14.6)
WBC: 8.6 10*3/uL (ref 4.5–10.5)

## 2013-05-10 LAB — BASIC METABOLIC PANEL
BUN: 23 mg/dL (ref 6–23)
CALCIUM: 9.5 mg/dL (ref 8.4–10.5)
CHLORIDE: 103 meq/L (ref 96–112)
CO2: 28 meq/L (ref 19–32)
Creatinine, Ser: 1 mg/dL (ref 0.4–1.2)
GFR: 58.29 mL/min — ABNORMAL LOW (ref >60.00)
GLUCOSE: 89 mg/dL (ref 70–99)
Potassium: 4.5 mEq/L (ref 3.5–5.1)
Sodium: 139 mEq/L (ref 135–145)

## 2013-05-10 LAB — TSH: TSH: 0.97 u[IU]/mL (ref 0.35–5.50)

## 2013-05-10 NOTE — Progress Notes (Signed)
Amber Snow Date of Birth: 01/13/1933 Medical Record #[id number]  History of Present Illness: Amber Snow is seen back today for a follow up visit. Has a nonischemic CM with EF of 35 to 40% by cath and echo from May of 2013. Nuclear at that time showed an EF of 27%. Echo in June of 2014 showed EF was 25 to 30%. She has had minimal nonobstructive disease by cardiac cath in May 2013. Other issues include COPD, past smoker, HTN, PVD, HLD, and bladder tumors.  On follow up today she complains of dizzyness and lightheadedness at times quite severe. May occur at rest but worse with standing. Also complains of pain in her back between her shoulder blades and shoulders.  Current Outpatient Prescriptions  Medication Sig Dispense Refill  . albuterol (PROVENTIL HFA;VENTOLIN HFA) 108 (90 BASE) MCG/ACT inhaler Inhale 2 puffs into the lungs every 6 (six) hours as needed. Shortness of breath      . Ascorbic Acid (VITAMIN C) 1000 MG tablet Take 1,000 mg by mouth daily.       Marland Kitchen BIOTIN PO Take 1 capsule by mouth daily.      . bisoprolol (ZEBETA) 5 MG tablet Take 5 mg by mouth every evening.      . Calcium Carbonate (CALTRATE 600) 1500 MG TABS Take 2.5 tablets by mouth daily.       . Cholecalciferol (VITAMIN D) 1000 UNITS capsule Take 1,000 Units by mouth daily.       Marland Kitchen ibuprofen (ADVIL,MOTRIN) 200 MG tablet Take 600 mg by mouth every 6 (six) hours as needed for pain.      Marland Kitchen losartan (COZAAR) 50 MG tablet TAKE 1 TABLET BY MOUTH AT BEDTIME  90 tablet  1  . Multiple Vitamin (MULITIVITAMIN WITH MINERALS) TABS Take 1 tablet by mouth daily.      . pravastatin (PRAVACHOL) 40 MG tablet Take 1 tablet (40 mg total) by mouth at bedtime.  90 tablet  3  . spironolactone (ALDACTONE) 25 MG tablet Take 25 mg by mouth every morning.      . SYMBICORT 160-4.5 MCG/ACT inhaler INHALE 2 PUFFS INTO LUNGS TWICE A DAY  10.2 g  2  . tiotropium (SPIRIVA) 18 MCG inhalation capsule Place 18 mcg into inhaler and inhale as directed. PER  DR WERT STOPPED MED.  FOR A TRIAL PERIOD      . traZODone (DESYREL) 50 MG tablet TAKE 1 TABLET BY MOUTH EVERY DAY  90 tablet  0  . vitamin E 400 UNIT capsule Take 400 Units by mouth daily.       . zoledronic acid (RECLAST) 5 MG/100ML SOLN Inject 5 mg into the vein once. Every year       No current facility-administered medications for this visit.    Allergies  Allergen Reactions  . Barbiturates Rash  . Penicillins Rash  . Shrimp [Shellfish Allergy] Swelling  . Aspirin Nausea Only  . Beta Adrenergic Blockers     PT STATES TOLD TO AVOID DUE TO COUGH REACTION TO CARDIZEM  . Calcium Channel Blockers     PER PT TOLD TO AVOID DUE TO COUGH REACTION TO CARDIZEM  . Celecoxib Nausea Only  . Codeine Other (See Comments)    REACTION: excessive sleep  . Diltiazem Hcl Other (See Comments)    REACTION: cough  . Fluvastatin Sodium   . Ibandronate Sodium Other (See Comments)    REACTION: HAIR LOSS/NAIL CHANGES  . Oxycodone-Acetaminophen Nausea Only  . Rosuvastatin   . Simvastatin  Past Medical History  Diagnosis Date  . Diverticulosis of colon   . Hyperlipidemia   . History of nephrolithiasis   . Osteopenia   . Arthritis HANDS  . Chronic systolic CHF (congestive heart failure)   . Renal calculus, bilateral   . Bladder cancer   . Bladder calculus   . COPD, moderate PULMOLOGIST-- DR Melvyn Novas    W/ EMPHYSEMA  (COPD  GOLD II)  . Wears hearing aid     BILATERAL  . Nonischemic cardiomyopathy   . LBBB (left bundle branch block)     CHRONIC  . PAC (premature atrial contraction)   . CAD (coronary artery disease) CARDIOLOGSIT-- DR Martinique    05-17-2013minimal nonobstructive CAD per cath with EF of 35 to 40% and normal filling pressures and right heart pressures  . Dyspnea on exertion   . Nodule of right lung     APICAL 1.0CM (MONITORED BY DR Melvyn Novas)  . Myalgia     Past Surgical History  Procedure Laterality Date  . Cosmetic surgery      eyelids  . Left long finger arthroplasty & pulley  relase  02-17-2005  . Cysto/ left retrograde pyelogram/ left ureteral stent placement  10-16-2001    STONE  . Pulmonary function analysis  07-31-2009    MODERATE TO SEVERE OBSTRUCTIVE AIRWAY DISEASE/ INSIGNIFICANT RESPONSE TO BRONCHODILATORS/ EMPHYSEMA PATTERN/ MILD DECREASE IN DIFFUSE CAPACITY FEF 25-75 CHANGED BY 8%  . Tubal ligation  AGE 80'S  . Tonsillectomy  CHILD  . Extracorporeal shock wave lithotripsy  2003  . Cataract extraction w/ intraocular lens  implant, bilateral  1998  . Transurethral resection of bladder tumor  07/01/2011    Procedure: TRANSURETHRAL RESECTION OF BLADDER TUMOR (TURBT);  Surgeon: Hanley Ben, MD;  Location: [facility];  Service: Urology;  Laterality: N/A;  CYSTO  . Cystoscopy  07/01/2011    Procedure: CYSTOSCOPY;  Surgeon: Hanley Ben, MD;  Location: [facility];  Service: Urology;  Laterality: N/A;  retrieval l of bladder stone  . Orif finger / thumb fracture    . Cystoscopy N/A 08/20/2012    Procedure: CYSTOSCOPY;  Surgeon: Hanley Ben, MD;  Location: [facility];  Service: Urology;  Laterality: N/A;  fulgeration of bladderm tumors  . Cardiac catheterization  09-12-2011  DR Martinique    MINIMAL NONOBSTRUCTIVE CAD/ MODERATE TO SEVERE LV DYSFUNCTION/  NORMAL RIGHT HEART PRESSURES AND LV FILLING PRESSURES  . Transthoracic echocardiogram  03-02-2013  DR Martinique    GRADE I DIASTOLIC DYSFUNCTION/   EF 60-65% (UP FROM 25-30% COMPARED TO LAST ECHO JUNE 2014 /  NORMAL LV SIZE AND WALL THICKNESS/ NORMAL LVSF/ MILD PV  . Cystoscopy with litholapaxy N/A 04/01/2013    Procedure: CYSTOSCOPY WITH HOLMIUM LASER OF  BLADDER CALCULUS;  Surgeon: Hanley Ben, MD;  Location: [facility];  Service: Urology;  Laterality: N/A;  . Holmium laser application N/A 78/12/3808    Procedure: HOLMIUM LASER APPLICATION;  Surgeon: Hanley Ben, MD;  Location: [place];  Service: Urology;  Laterality: N/A;  .  Cystoscopy w/ retrogrades Right 04/01/2013    Procedure: CYSTOSCOPY WITH RETROGRADE PYELOGRAM;  Surgeon: Hanley Ben, MD;  Location: [place];  Service: Urology;  Laterality: Right;    History  Smoking status  . Former Smoker -- 1.00 packs/day for 50 years  . Types: Cigarettes  . Quit date: 09/27/2011  Smokeless tobacco  . Never Used    History  Alcohol Use No  Family History  Problem Relation Age of Onset  . Heart disease Mother   . Hypertension Mother   . Hypertension Father   . Heart attack Father   . Heart disease Father   . Thyroid disease Sister   . Heart attack Sister   . Stroke Sister   . COPD Sister   . Heart attack Brother   . Cancer Maternal Grandmother     breast cancer  . Diabetes Paternal Grandmother   . Asthma Neg Hx     Review of Systems: The review of systems is per the HPI.  All other systems were reviewed and are negative.  Physical Exam: BP 122/68  Pulse 60  Ht 5\' 3"  (1.6 m)  Wt 104 lb 12.8 oz (47.537 kg)  BMI 18.57 kg/m2 No orthostatic changes. Patient is very pleasant and in no acute distress. Skin is warm and dry. Color is normal.  HEENT is unremarkable.  Neck is supple. No masses. No JVD. Lungs are clear. Cardiac exam shows a regular rate and rhythm. Abdomen is soft. Extremities are without edema. Gait and ROM are intact. No gross neurologic deficits noted.  LABORATORY DATA: BMET is pending  Lab Results  Component Value Date   WBC 12.9 Repeated and verified X2.* 09/30/2012   HGB 12.2 04/01/2013   HCT 36.0 04/01/2013   PLT 285.0 09/30/2012   GLUCOSE 94 04/01/2013   CHOL 226* 06/14/2012   TRIG 215.0* 06/14/2012   HDL 64.80 06/14/2012   LDLDIRECT 126.5 06/14/2012   LDLCALC 82 01/03/2011   ALT 24 09/25/2012   AST 23 09/25/2012   NA 141 04/01/2013   K 4.2 04/01/2013   CL 99 02/25/2013   CREATININE 0.9 02/25/2013   BUN 23 02/25/2013   CO2 29 02/25/2013   TSH 0.79 06/14/2012   INR 1.0 09/04/2011   HGBA1C 5.8* 06/04/2012   Echo  Study Conclusions from November 2014  Study Conclusions  - Left ventricle: The cavity size was normal. Wall thickness was normal. Systolic function was normal. The estimated ejection fraction was in the range of 60% to 65%. Wall motion was normal; there were no regional wall motion abnormalities. Doppler parameters are consistent with abnormal left ventricular relaxation (grade 1 diastolic dysfunction). - Ventricular septum: Septal motion showed paradox.    Assessment / Plan:   1. Nonischemic CM with chronic systolic heart failure - EF normal by Echo in November. - No need for ICD. She is on a good CHF regimen. Given her significant lightheadedness I have recommended reducing bisoprolol to 2.5 mg daily and aldactone to 12.5 mg daily. Will check BMET, CBC, and TSH today. Follow up in 6 months.  2. Chronic LBBB   3. CAD - nonobstructive per last cath - managed medically - no symptoms.   4. COPD - breathing seems stable.

## 2013-05-10 NOTE — Patient Instructions (Signed)
Reduce bisoprolol to 2.5 mg daily  Reduce aldactone to 12.5 mg daily  Continue losartan at current dose  We will check blood work today  I will see you in 6 months.

## 2013-05-12 ENCOUNTER — Telehealth: Payer: Self-pay | Admitting: Internal Medicine

## 2013-05-12 NOTE — Telephone Encounter (Signed)
Called patient to advise, she said okay

## 2013-05-12 NOTE — Telephone Encounter (Signed)
Patient called stating her primary is Dr Linna Darner and he is retiring, she stated her husband International aid/development worker) see Dr Alain Marion as PCP  And would like to know if he would take her as a new patient - I did inform her doctor wasn't accepting new patienrts as this time and she insisted that I still ask  Please advise

## 2013-05-12 NOTE — Telephone Encounter (Signed)
Pls explain to pt - no need to switch now. Dr Linna Darner will be working at [employer] office till 2016. Then she can switch to me Thx

## 2013-05-17 ENCOUNTER — Other Ambulatory Visit: Payer: Self-pay | Admitting: *Deleted

## 2013-05-17 MED ORDER — TRAZODONE HCL 50 MG PO TABS: ORAL_TABLET | ORAL | Status: DC

## 2013-06-14 ENCOUNTER — Other Ambulatory Visit: Payer: Medicare Other

## 2013-06-14 ENCOUNTER — Other Ambulatory Visit: Payer: Self-pay | Admitting: Internal Medicine

## 2013-06-15 ENCOUNTER — Other Ambulatory Visit: Payer: Medicare Other

## 2013-06-15 ENCOUNTER — Ambulatory Visit: Payer: Medicare Other | Admitting: Internal Medicine

## 2013-06-15 NOTE — Telephone Encounter (Signed)
Rx sent to the pharmacy by e-script.//AB/CMA 

## 2013-06-20 ENCOUNTER — Encounter: Payer: Self-pay | Admitting: Internal Medicine

## 2013-06-20 ENCOUNTER — Ambulatory Visit (INDEPENDENT_AMBULATORY_CARE_PROVIDER_SITE_OTHER)
Admission: RE | Admit: 2013-06-20 | Discharge: 2013-06-20 | Disposition: A | Discharge status: Home / Self Care | Payer: Medicare Other | Source: Ambulatory Visit | Attending: Internal Medicine | Admitting: Internal Medicine

## 2013-06-20 ENCOUNTER — Ambulatory Visit (INDEPENDENT_AMBULATORY_CARE_PROVIDER_SITE_OTHER): Payer: Medicare Other | Admitting: Internal Medicine

## 2013-06-20 VITALS — BP 128/70 | HR 63 | Temp 97.5°F | Ht 63.0 in | Wt 107.2 lb

## 2013-06-20 DIAGNOSIS — R911 Solitary pulmonary nodule: Secondary | ICD-10-CM

## 2013-06-20 DIAGNOSIS — J449 Chronic obstructive pulmonary disease, unspecified: Secondary | ICD-10-CM

## 2013-06-20 NOTE — Progress Notes (Signed)
Subjective:    Patient ID: Amber Snow, female    DOB: 06-05-1932  MRN: [id number]   Brief patient profile:  56 yowf with GOLD II copd by pfts 07/2009 quit smoking June 2013  due to progressive doe x 5 years worse since quit referred to pulmonary clinic 06/17/2012 by Dr Ignacia Palma p admit :  Admit date: 06/01/2012  Discharge date: 06/05/2012  Discharge Diagnoses:  Active Problems:  HYPERLIPIDEMIA  LEFT BUNDLE BRANCH BLOCK  COPD  LOW BACK PAIN, CHRONIC  LV dysfunction  Lung nodule > outpt pet rec COPD exacerbation  Nonischemic cardiomyopathy   COPD exacerbation  -CT angiogram of the chest was negative for pulmonary embolus  -Patient was started on Solu-Medrol, Levaquin, and bronchodilators  -Wheezing has improved with Solu-Medrol  -changed to by mouth prednisone without decompensation  -Continue every 6 hour albuterol-And when necessary for shortness of breath  -Continue levofloxacin--adjust Levaquin dose for renal function--final dose to be given on 06/06/2012 to complete 5 days  -I believe that her continuing dyspnea on exertion is a combination of her COPD exacerbation, cardiomyopathy, and deconditioning.  -The patient was subsequently weaned off of oxygen supplementation. Her oxygen saturation was 90-94% with activity on room air.  -She will go home with a prednisone wean starting with 50 mg on day #1 and decreasing by 10 mg daily    06/17/2012 1st pulmonary eval/ Amber Snow s/p hosp in January 2014 cc doe x 50 ft variable improvement with saba =proaire rec Symbicort 2 every 12 hours Spiriva once daily  Only use your albuterol (proaire) as a rescue medication Work on inhaler technique:   07/29/2012  ov/Amber Snow off cigs for a year f/u for copd/ R apical nodule Chief Complaint  Patient presents with  . Follow-up    Pt states breathing is the same, but overall feels better since last visit. Has occ prod cough with beige to clear sputum.    cc improved with less need for proaire and  walk at [place] still has trouble more than one room vacuum >>set up for CT chest ~11/2012   09/24/2012 [facility] follow up  Admitted 5/22 -26 for CAP and COPD exacerbation w/ fever and LLL opcaity.  Tx w/ IV abx, nebs and steroids  Discharged on Levaquin , steroid taper.  She is feeling better but still weak.  Reports is "so-so" w/ dyspnea, occ wheezing, dry hacking cough, chest congestion, general malaise.    rec No change rx  10/26/2012 f/u ov/Amber Snow re copd/ ? Recurrent pna Chief Complaint  Patient presents with  . Follow-up    Pt states her breathing is unchanged since the last visit. She c/o non prod cough no better since the last visit.     sob mailbox and back without stopping and using neb twice daily. Cough is dry and day > noct. rec Plan A = automatic = symbicort and spiriva PLan B= backup = proaire up to 2 puffs every 4 hours as needed, don't leave home without it Plan C = nebulizer only use if plan B doesn't work Plan D= Doctor call if not satisfied with Plan C or having to use a lot  Work on inhaler technique:   12/10/2012 f/u ov/Amber Snow re copd/ f/u abn ct Chief Complaint  Patient presents with  . Follow-up    Pt states no changes in her breathing. Cough has improved some. No new co's today.  outdoor walking difficult, but can do mall walking since returned from [place]  Cough is  rattling > beige, never  Bloody  rec No change rx Walk indoors up to 30 min daily but pace yourself   03/14/2013 f/u ov/Amber Snow re: copd GOLD II/ SPN LUL Chief Complaint  Patient presents with  . Followup with cxr    Pt states her breathing seems to be doing well. She denies any new co's today. Pt states she uses rescue inhaler 2-3 times per month on average.   freq cough, choking sensation esp in am assoc with dry mouth and feeling congested but no excess mucus or noct exac- overall much better though and can really tell when misses dose of symbicort Not really limited from desired  activities due to sob on smb and spiriva rec Stop spiriva for now to see what difference this makes    06/20/2013 f/u ov/Amber Snow re:   maint on symbicort 2 puffs 7 am and  5 pm Chief Complaint  Patient presents with  . Follow-up    Pt states having increased DOE for the past 3 months. She states she gets SOB just walking across the room, and gets lightheaded when this happens.   every day esp in evening can't get across the room more due to breathing than fatigue but both a factor No better on saba ? No better on spiriva       No obvious day to day  variabilty or assoc cough cp or chest tightness, subjective wheeze overt sinus or hb symptoms. No unusual exp hx or h/o childhood pna/ asthma or knowledge of premature birth.   Sleeping ok without nocturnal  or early am exacerbation  of respiratory  c/o's or need for noct saba. Also denies any obvious fluctuation of symptoms with weather or environmental changes or other aggravating or alleviating factors except as outlined above  Current Medications, Allergies, Past Medical History, Past Surgical History, Family History, and Social History were reviewed in [employer] record.  ROS  The following are not active complaints unless bolded sore throat, dysphagia, dental problems, itching, sneezing,  nasal congestion or excess/ purulent secretions, ear ache,   fever, chills, sweats, unintended wt loss, pleuritic or exertional cp, hemoptysis,  orthopnea pnd or leg swelling, presyncope, palpitations, heartburn, abdominal pain, anorexia, nausea, vomiting, diarrhea  or change in bowel or urinary habits, change in stools or urine, dysuria,hematuria,  rash, arthralgias, visual complaints, headache, numbness weakness or ataxia or problems with walking or coordination,  change in mood/affect or memory.                Objective:   Physical Exam  07/29/2012  105  >102 09/24/2012 > 10/26/2012  99 > 12/10/2012 99 > 104 03/14/2013 >  06/20/2013 107     HEENT mild turbinate edema.  Oropharynx no thrush or excess pnd or cobblestoning.  No JVD or cervical adenopathy. Mild accessory muscle hypertrophy. Trachea midline, nl thryroid. Chest was hyperinflated by percussion with diminished breath sounds and moderate increased exp time without wheeze. Hoover sign positive at mid inspiration. Regular rate and rhythm without murmur gallop or rub or increase P2 or edema.  Abd: no hsm, nl excursion. Ext warm without cyanosis or clubbing.       CT 06/20/2013   . Slight interval decrease in overall conspicuity of a nodular  lesion in the left upper lobe, as described in detail above.       Assessment & Plan:

## 2013-06-20 NOTE — Patient Instructions (Signed)
Add back the spiriva each am after the symbicort to see if afternoon breathing is easier, if not it's ok to stop.  Remember the technique with the spiriva   We will put you in for one final limited CT of chest in 6 months with office visit same day

## 2013-06-21 ENCOUNTER — Encounter: Payer: Medicare Other | Admitting: Internal Medicine

## 2013-06-22 NOTE — Assessment & Plan Note (Addendum)
-   PFT's 07/31/09 FEV1  1.36 (74%) ratio 45 and no better p B2,  DLCO 66%  - quit smoking June 2013 - PFT's 07/29/2012 FEV1 1.10 (63%) ratio 46 and no change p B2 and DLCO 57%  - 10/26/2012  Walked RA x 3 laps @ 185 ft each stopped due to end of study, no desats - Trial off spiriva 03/14/2013 > rechallenged 06/20/12 due to worse doe    Not clear why worse doe but this is a persistent problem so reasonable to rechallenge with LAMA  The proper method of use, as well as anticipated side effects, of a metered-dose inhaler are discussed and demonstrated to the patient. Improved effectiveness after extensive coaching during this visit to a level of approximately  90% so try spriva respimat since already failed spirva dpi - if not improving would consider re-eval cardiac status

## 2013-06-22 NOTE — Assessment & Plan Note (Signed)
-   Not seen on cxr  03/08/2012 and obvious on cxr 06/01/12 - 06/01/12 CT chest . Technically adequate exam showing no evidence for acute  pulmonary embolus.  2. Dependent changes in the lung bases bilaterally, favoring edema  or infectious process.  3. Right apical lung nodule is 1.0 cm in diameter. - CT 12/09/2012 1. Bullous emphysema with biapical scarring, right greater than left. 2. New spiculated nodular density in the left upper lobe. Follow- up CT chest without contrast is recommended in 3 months.  . 3. Resolved bilateral lower lobe air space disease with residual post infectious scarring. 4. Right nephrolithiasis. - CT  06/20/2013 1. Slight interval decrease in overall conspicuity of a nodular lesion in the left upper lobe, as described in detail above. Additional follow-up in 6 months is recommended in further evaluation, as clinically indicated.   Discussed in detail all the  indications, usual  risks and alternatives  relative to the benefits with patient who agrees to proceed with one final f/u ct in 6 m, placed in tickle file

## 2013-06-28 ENCOUNTER — Other Ambulatory Visit: Payer: Self-pay | Admitting: Internal Medicine

## 2013-07-04 ENCOUNTER — Telehealth: Payer: Self-pay | Admitting: Internal Medicine

## 2013-07-04 NOTE — Telephone Encounter (Signed)
Spoke with pt and advised of Dr Gustavus Bryant recommendations.   Pt verbalized understanding.  Made pt appt for 4 week f/u with Dr Melvyn Novas

## 2013-07-04 NOTE — Telephone Encounter (Signed)
Use the prn albuterol for this and return w/in 4-6 weeks to regroup

## 2013-07-04 NOTE — Telephone Encounter (Signed)
Per OV 06/20/13: Patient Instructions      Add back the spiriva each am after the symbicort to see if afternoon breathing is easier, if not it's ok to stop.  --  Pt has added spiriva back x 10 days. Has not noticed any difference. She did develop more cough. She stopped thia about 4 days ago. She is still feeling SOB in afternoon until she takes her symbicort at 4:30-5PM. She takes the symbicort 7AM sos he does not even make it 12 hrs. Please advise MW thanks  Allergies  Allergen Reactions  . Barbiturates Rash  . Penicillins Rash  . Shrimp [Shellfish Allergy] Swelling  . Aspirin Nausea Only  . Beta Adrenergic Blockers     PT STATES TOLD TO AVOID DUE TO COUGH REACTION TO CARDIZEM  . Calcium Channel Blockers     PER PT TOLD TO AVOID DUE TO COUGH REACTION TO CARDIZEM  . Celecoxib Nausea Only  . Codeine Other (See Comments)    REACTION: excessive sleep  . Diltiazem Hcl Other (See Comments)    REACTION: cough  . Fluvastatin Sodium   . Ibandronate Sodium Other (See Comments)    REACTION: HAIR LOSS/NAIL CHANGES  . Oxycodone-Acetaminophen Nausea Only  . Rosuvastatin   . Simvastatin

## 2013-07-06 ENCOUNTER — Encounter: Payer: Self-pay | Admitting: Internal Medicine

## 2013-07-06 ENCOUNTER — Ambulatory Visit (INDEPENDENT_AMBULATORY_CARE_PROVIDER_SITE_OTHER): Payer: Medicare Other | Admitting: Internal Medicine

## 2013-07-06 ENCOUNTER — Other Ambulatory Visit (INDEPENDENT_AMBULATORY_CARE_PROVIDER_SITE_OTHER): Payer: Medicare Other

## 2013-07-06 VITALS — BP 130/60 | Temp 99.0°F | Ht 63.0 in | Wt 107.0 lb

## 2013-07-06 DIAGNOSIS — R5383 Other fatigue: Secondary | ICD-10-CM

## 2013-07-06 DIAGNOSIS — J449 Chronic obstructive pulmonary disease, unspecified: Secondary | ICD-10-CM

## 2013-07-06 DIAGNOSIS — M545 Low back pain, unspecified: Secondary | ICD-10-CM

## 2013-07-06 DIAGNOSIS — R930 Abnormal findings on diagnostic imaging of skull and head, not elsewhere classified: Secondary | ICD-10-CM

## 2013-07-06 DIAGNOSIS — R5381 Other malaise: Secondary | ICD-10-CM

## 2013-07-06 DIAGNOSIS — E785 Hyperlipidemia, unspecified: Secondary | ICD-10-CM

## 2013-07-06 DIAGNOSIS — I428 Other cardiomyopathies: Secondary | ICD-10-CM

## 2013-07-06 DIAGNOSIS — Z0000 Encounter for general adult medical examination without abnormal findings: Secondary | ICD-10-CM | POA: Insufficient documentation

## 2013-07-06 DIAGNOSIS — R0609 Other forms of dyspnea: Secondary | ICD-10-CM

## 2013-07-06 DIAGNOSIS — J4489 Other specified chronic obstructive pulmonary disease: Secondary | ICD-10-CM

## 2013-07-06 DIAGNOSIS — R0600 Dyspnea, unspecified: Secondary | ICD-10-CM

## 2013-07-06 DIAGNOSIS — R0989 Other specified symptoms and signs involving the circulatory and respiratory systems: Secondary | ICD-10-CM

## 2013-07-06 DIAGNOSIS — Z23 Encounter for immunization: Secondary | ICD-10-CM

## 2013-07-06 LAB — VITAMIN B12: VITAMIN B 12: 413 pg/mL (ref 211–911)

## 2013-07-06 LAB — URINALYSIS, ROUTINE W REFLEX MICROSCOPIC
Bilirubin Urine: NEGATIVE
HGB URINE DIPSTICK: NEGATIVE
Ketones, ur: NEGATIVE
NITRITE: NEGATIVE
RBC / HPF: NONE SEEN (ref 0–?)
Specific Gravity, Urine: <=1.005 — AB (ref 1.000–1.030)
TOTAL PROTEIN, URINE-UPE24: NEGATIVE
UROBILINOGEN UA: 0.2 (ref 0.0–1.0)
Urine Glucose: NEGATIVE
pH: 6.5 (ref 5.0–8.0)

## 2013-07-06 LAB — HEPATIC FUNCTION PANEL
ALBUMIN: 4.5 g/dL (ref 3.5–5.2)
ALK PHOS: 73 U/L (ref 39–117)
ALT: 23 U/L (ref 0–35)
AST: 32 U/L (ref 0–37)
Bilirubin, Direct: 0.1 mg/dL (ref 0.0–0.3)
Total Bilirubin: 0.7 mg/dL (ref 0.3–1.2)
Total Protein: 7.4 g/dL (ref 6.0–8.3)

## 2013-07-06 LAB — LIPID PANEL
Cholesterol: 221 mg/dL — ABNORMAL HIGH (ref 0–200)
HDL: 60.9 mg/dL (ref >39.00)
LDL Cholesterol: 120 mg/dL — ABNORMAL HIGH (ref 0–99)
Total CHOL/HDL Ratio: 4
Triglycerides: 200 mg/dL — ABNORMAL HIGH (ref 0.0–149.0)
VLDL: 40 mg/dL (ref 0.0–40.0)

## 2013-07-06 LAB — BASIC METABOLIC PANEL
BUN: 21 mg/dL (ref 6–23)
CO2: 30 meq/L (ref 19–32)
Calcium: 9.9 mg/dL (ref 8.4–10.5)
Chloride: 100 mEq/L (ref 96–112)
Creatinine, Ser: 0.9 mg/dL (ref 0.4–1.2)
GFR: 67.74 mL/min (ref >60.00)
GLUCOSE: 94 mg/dL (ref 70–99)
Potassium: 4.7 mEq/L (ref 3.5–5.1)
Sodium: 136 mEq/L (ref 135–145)

## 2013-07-06 LAB — TSH: TSH: 1.17 u[IU]/mL (ref 0.35–5.50)

## 2013-07-06 LAB — CBC WITH DIFFERENTIAL/PLATELET
BASOS ABS: 0.1 10*3/uL (ref 0.0–0.1)
Basophils Relative: 0.9 % (ref 0.0–3.0)
EOS ABS: 0.1 10*3/uL (ref 0.0–0.7)
Eosinophils Relative: 0.8 % (ref 0.0–5.0)
HCT: 40 % (ref 36.0–46.0)
HEMOGLOBIN: 13.6 g/dL (ref 12.0–15.0)
Lymphocytes Relative: 26.5 % (ref 12.0–46.0)
Lymphs Abs: 2.7 10*3/uL (ref 0.7–4.0)
MCHC: 34.1 g/dL (ref 30.0–36.0)
MCV: 95.6 fl (ref 78.0–100.0)
MONOS PCT: 7.5 % (ref 3.0–12.0)
Monocytes Absolute: 0.8 10*3/uL (ref 0.1–1.0)
NEUTROS ABS: 6.6 10*3/uL (ref 1.4–7.7)
Neutrophils Relative %: 64.3 % (ref 43.0–77.0)
PLATELETS: 292 10*3/uL (ref 150.0–400.0)
RBC: 4.18 Mil/uL (ref 3.87–5.11)
RDW: 12.9 % (ref 11.5–14.6)
WBC: 10.3 10*3/uL (ref 4.5–10.5)

## 2013-07-06 LAB — SEDIMENTATION RATE: SED RATE: 25 mm/h — AB (ref 0–22)

## 2013-07-06 LAB — CORTISOL: CORTISOL PLASMA: 8.3 ug/dL

## 2013-07-06 LAB — CK: CK TOTAL: 158 U/L (ref 7–177)

## 2013-07-06 NOTE — Progress Notes (Signed)
Pre visit review using our clinic review tool, if applicable. No additional management support is needed unless otherwise documented below in the visit note. 

## 2013-07-06 NOTE — Assessment & Plan Note (Signed)

## 2013-07-06 NOTE — Assessment & Plan Note (Signed)
Chest CT:  IMPRESSION:  1. Slight interval decrease in overall conspicuity of a nodular  lesion in the left upper lobe, as described in detail above.  Additional follow-up in 6 months is recommended in further  evaluation, as clinically indicated. This recommendation follows the  consensus statement: Guidelines for Management of Small Pulmonary  Nodules Detected on CT Scans: A Statement from the [employer] as published in Radiology 2005; 237:395-400.  2. Bilateral renal stones.  3. Three-vessel coronary artery calcification.  Electronically Signed  By: Lorin Picket M.D.  On: 06/20/2013 09:34  On Symbicort and Spiriva

## 2013-07-06 NOTE — Assessment & Plan Note (Signed)
Chest CT:  IMPRESSION:  1. Slight interval decrease in overall conspicuity of a nodular  lesion in the left upper lobe, as described in detail above.  Additional follow-up in 6 months is recommended in further  evaluation, as clinically indicated. This recommendation follows the  consensus statement: Guidelines for Management of Small Pulmonary  Nodules Detected on CT Scans: A Statement from the [place] as published in Radiology 2005; 237:395-400.  2. Bilateral renal stones.  3. Three-vessel coronary artery calcification.  Electronically Signed  By: Lorin Picket M.D.  On: 06/20/2013 09:34

## 2013-07-06 NOTE — Assessment & Plan Note (Signed)
Continue with current prescription therapy as reflected on the Med list.  

## 2013-07-06 NOTE — Progress Notes (Signed)
   Subjective:    Patient ID: Amber Snow, female    DOB: 13-Oct-1932, 80 y.o.   MRN: [id number]  HPI  The patient is here for a wellness exam. C/o chronic SOB F/u COPD, CHF, OA  BP Readings from Last 3 Encounters:  07/06/13 130/60  06/20/13 128/70  05/10/13 122/68   Wt Readings from Last 3 Encounters:  07/06/13 107 lb (48.535 kg)  06/20/13 107 lb 3.2 oz (48.626 kg)  05/10/13 104 lb 12.8 oz (47.537 kg)       Review of Systems  Constitutional: Negative for fever, chills, diaphoresis, activity change, appetite change, fatigue and unexpected weight change.  HENT: Negative for congestion, dental problem, ear pain, hearing loss, mouth sores, postnasal drip, sinus pressure, sneezing, sore throat and voice change.   Eyes: Negative for pain and visual disturbance.  Respiratory: Positive for cough and shortness of breath. Negative for chest tightness, wheezing and stridor.   Cardiovascular: Negative for chest pain, palpitations and leg swelling.  Gastrointestinal: Negative for nausea, vomiting, abdominal pain, blood in stool, abdominal distention and rectal pain.  Genitourinary: Negative for dysuria, hematuria, decreased urine volume, vaginal bleeding, vaginal discharge, difficulty urinating, vaginal pain and menstrual problem.  Musculoskeletal: Positive for back pain. Negative for gait problem, joint swelling and neck pain.  Skin: Negative for color change, rash and wound.  Neurological: Negative for dizziness, tremors, syncope, speech difficulty and light-headedness.  Hematological: Negative for adenopathy.  Psychiatric/Behavioral: Negative for suicidal ideas, hallucinations, behavioral problems, confusion, sleep disturbance, dysphoric mood and decreased concentration. The patient is not nervous/anxious and is not hyperactive.        Objective:   Physical Exam  Constitutional: She appears well-developed. No distress.  HENT:  Head: Normocephalic.  Right Ear: External ear  normal.  Left Ear: External ear normal.  Nose: Nose normal.  Mouth/Throat: Oropharynx is clear and moist.  Eyes: Conjunctivae are normal. Pupils are equal, round, and reactive to light. Right eye exhibits no discharge. Left eye exhibits no discharge.  Neck: Normal range of motion. Neck supple. No JVD present. No tracheal deviation present. No thyromegaly present.  Cardiovascular: Normal rate, regular rhythm and normal heart sounds.   Pulmonary/Chest: No stridor. No respiratory distress. She has no wheezes.  Abdominal: Soft. Bowel sounds are normal. She exhibits no distension and no mass. There is no tenderness. There is no rebound and no guarding.  Musculoskeletal: She exhibits no edema and no tenderness.  Lymphadenopathy:    She has no cervical adenopathy.  Neurological: She displays normal reflexes. No cranial nerve deficit. She exhibits normal muscle tone. Coordination normal.  Skin: No rash noted. No erythema.  Psychiatric: She has a normal mood and affect. Her behavior is normal. Judgment and thought content normal.    Lab Results  Component Value Date   WBC 8.6 05/10/2013   HGB 13.3 05/10/2013   HCT 39.2 05/10/2013   PLT 270.0 05/10/2013   GLUCOSE 89 05/10/2013   CHOL 226* 06/14/2012   TRIG 215.0* 06/14/2012   HDL 64.80 06/14/2012   LDLDIRECT 126.5 06/14/2012   LDLCALC 82 01/03/2011   ALT 24 09/25/2012   AST 23 09/25/2012   NA 139 05/10/2013   K 4.5 05/10/2013   CL 103 05/10/2013   CREATININE 1.0 05/10/2013   BUN 23 05/10/2013   CO2 28 05/10/2013   TSH 0.97 05/10/2013   INR 1.0 09/04/2011   HGBA1C 5.8* 06/04/2012         Assessment & Plan:

## 2013-07-06 NOTE — Assessment & Plan Note (Signed)
2014-15 chronic  Treat COPD, CHF Labs

## 2013-07-06 NOTE — Patient Instructions (Signed)
Hold Bisoprolol x 3 days to see if your breathing is better

## 2013-07-06 NOTE — Assessment & Plan Note (Signed)
Labs

## 2013-07-07 LAB — VITAMIN D 25 HYDROXY (VIT D DEFICIENCY, FRACTURES): VIT D 25 HYDROXY: 70 ng/mL (ref 30–89)

## 2013-07-12 ENCOUNTER — Other Ambulatory Visit: Payer: Self-pay | Admitting: Obstetrics

## 2013-07-27 ENCOUNTER — Other Ambulatory Visit (HOSPITAL_COMMUNITY): Payer: Self-pay | Admitting: Obstetrics

## 2013-07-27 ENCOUNTER — Encounter (HOSPITAL_COMMUNITY): Payer: Self-pay

## 2013-07-27 ENCOUNTER — Ambulatory Visit (HOSPITAL_COMMUNITY)
Admission: RE | Admit: 2013-07-27 | Discharge: 2013-07-27 | Disposition: A | Discharge status: Home / Self Care | Payer: Medicare Other | Source: Ambulatory Visit | Attending: Obstetrics | Admitting: Obstetrics

## 2013-07-27 DIAGNOSIS — M899 Disorder of bone, unspecified: Secondary | ICD-10-CM | POA: Insufficient documentation

## 2013-07-27 DIAGNOSIS — M949 Disorder of cartilage, unspecified: Principal | ICD-10-CM

## 2013-07-27 DIAGNOSIS — K224 Dyskinesia of esophagus: Secondary | ICD-10-CM | POA: Insufficient documentation

## 2013-07-27 MED ORDER — SODIUM CHLORIDE 0.9 % IV SOLN
Freq: Once | INTRAVENOUS | Status: AC
  Administered 2013-07-27: 14:00:00 via INTRAVENOUS

## 2013-07-27 MED ORDER — ZOLEDRONIC ACID 5 MG/100ML IV SOLN
5.0000 mg | Freq: Once | INTRAVENOUS | Status: AC
  Administered 2013-07-27: 5 mg via INTRAVENOUS
  Filled 2013-07-27: qty 100

## 2013-07-28 ENCOUNTER — Ambulatory Visit (INDEPENDENT_AMBULATORY_CARE_PROVIDER_SITE_OTHER): Payer: Medicare Other | Admitting: Internal Medicine

## 2013-07-28 ENCOUNTER — Encounter: Payer: Self-pay | Admitting: Internal Medicine

## 2013-07-28 VITALS — BP 130/70 | HR 54 | Temp 97.7°F | Ht 63.0 in | Wt 109.4 lb

## 2013-07-28 DIAGNOSIS — I5022 Chronic systolic (congestive) heart failure: Secondary | ICD-10-CM

## 2013-07-28 DIAGNOSIS — I509 Heart failure, unspecified: Secondary | ICD-10-CM

## 2013-07-28 DIAGNOSIS — R911 Solitary pulmonary nodule: Secondary | ICD-10-CM

## 2013-07-28 DIAGNOSIS — J449 Chronic obstructive pulmonary disease, unspecified: Secondary | ICD-10-CM

## 2013-07-28 NOTE — Patient Instructions (Addendum)
Stop bisoprolol  If blood pressure rises above 135/80 simply increase the  cozaar to where both morning and night and Dr Alain Marion know.

## 2013-07-28 NOTE — Progress Notes (Signed)
Subjective:    Patient ID: Amber Snow, female    DOB: 06-05-1932  MRN: [id number]   Brief patient profile:  56 yowf with GOLD II copd by pfts 07/2009 quit smoking June 2013  due to progressive doe x 5 years worse since quit referred to pulmonary clinic 06/17/2012 by Dr Ignacia Palma p admit :  Admit date: 06/01/2012  Discharge date: 06/05/2012  Discharge Diagnoses:  Active Problems:  HYPERLIPIDEMIA  LEFT BUNDLE BRANCH BLOCK  COPD  LOW BACK PAIN, CHRONIC  LV dysfunction  Lung nodule > outpt pet rec COPD exacerbation  Nonischemic cardiomyopathy   COPD exacerbation  -CT angiogram of the chest was negative for pulmonary embolus  -Patient was started on Solu-Medrol, Levaquin, and bronchodilators  -Wheezing has improved with Solu-Medrol  -changed to by mouth prednisone without decompensation  -Continue every 6 hour albuterol-And when necessary for shortness of breath  -Continue levofloxacin--adjust Levaquin dose for renal function--final dose to be given on 06/06/2012 to complete 5 days  -I believe that her continuing dyspnea on exertion is a combination of her COPD exacerbation, cardiomyopathy, and deconditioning.  -The patient was subsequently weaned off of oxygen supplementation. Her oxygen saturation was 90-94% with activity on room air.  -She will go home with a prednisone wean starting with 50 mg on day #1 and decreasing by 10 mg daily    06/17/2012 1st pulmonary eval/ Amber Snow s/p hosp in January 2014 cc doe x 50 ft variable improvement with saba =proaire rec Symbicort 2 every 12 hours Spiriva once daily  Only use your albuterol (proaire) as a rescue medication Work on inhaler technique:   07/29/2012  ov/Amber Snow off cigs for a year f/u for copd/ R apical nodule Chief Complaint  Patient presents with  . Follow-up    Pt states breathing is the same, but overall feels better since last visit. Has occ prod cough with beige to clear sputum.    cc improved with less need for proaire and  walk at [place] still has trouble more than one room vacuum >>set up for CT chest ~11/2012   09/24/2012 [facility] follow up  Admitted 5/22 -26 for CAP and COPD exacerbation w/ fever and LLL opcaity.  Tx w/ IV abx, nebs and steroids  Discharged on Levaquin , steroid taper.  She is feeling better but still weak.  Reports is "so-so" w/ dyspnea, occ wheezing, dry hacking cough, chest congestion, general malaise.    rec No change rx  10/26/2012 f/u ov/Amber Snow re copd/ ? Recurrent pna Chief Complaint  Patient presents with  . Follow-up    Pt states her breathing is unchanged since the last visit. She c/o non prod cough no better since the last visit.     sob mailbox and back without stopping and using neb twice daily. Cough is dry and day > noct. rec Plan A = automatic = symbicort and spiriva PLan B= backup = proaire up to 2 puffs every 4 hours as needed, don't leave home without it Plan C = nebulizer only use if plan B doesn't work Plan D= Doctor call if not satisfied with Plan C or having to use a lot  Work on inhaler technique:   12/10/2012 f/u ov/Amber Snow re copd/ f/u abn ct Chief Complaint  Patient presents with  . Follow-up    Pt states no changes in her breathing. Cough has improved some. No new co's today.  outdoor walking difficult, but can do mall walking since returned from [place]  Cough is  rattling > beige, never  Bloody  rec No change rx Walk indoors up to 30 min daily but pace yourself   03/14/2013 f/u ov/Amber Snow re: copd GOLD II/ SPN LUL Chief Complaint  Patient presents with  . Followup with cxr    Pt states her breathing seems to be doing well. She denies any new co's today. Pt states she uses rescue inhaler 2-3 times per month on average.   freq cough, choking sensation esp in am assoc with dry mouth and feeling congested but no excess mucus or noct exac- overall much better though and can really tell when misses dose of symbicort Not really limited from desired  activities due to sob on smb and spiriva rec Stop spiriva for now to see what difference this makes    06/20/2013 f/u ov/Amber Snow re:   maint on symbicort 2 puffs 7 am and  5 pm Chief Complaint  Patient presents with  . Follow-up    Pt states having increased DOE for the past 3 months. She states she gets SOB just walking across the room, and gets lightheaded when this happens.   every day esp in evening can't get across the room more due to breathing than fatigue but both a factor No better on saba ? No better on spiriva  rec Add back the spiriva each am after the symbicort to see if afternoon breathing is easier, if not it's ok to stop. Remember the technique with the spiriva  We will put you in for one final limited CT of chest in 6 months with office visit same day    07/28/2013 f/u ov/Amber Snow re: COPD GOLD II on maint symb 160 2bid/ off spiriva as made no difference in doe Chief Complaint  Patient presents with  . Follow-up    Pt reports having increased SOB today. She relates this to her BP med Bisoprolol. She stopped med temporarily and breathing improved, but is worse since she started back on med.    off bisoprolol breathing and energy much better  Off bisoprolol needed saba  maybe once a week, back on it using saba once daily     No obvious day to day  variabilty or assoc cough cp or chest tightness, subjective wheeze overt sinus or hb symptoms. No unusual exp hx or h/o childhood pna/ asthma or knowledge of premature birth.   Sleeping ok without nocturnal  or early am exacerbation  of respiratory  c/o's or need for noct saba. Also denies any obvious fluctuation of symptoms with weather or environmental changes or other aggravating or alleviating factors except as outlined above  Current Medications, Allergies, Past Medical History, Past Surgical History, Family History, and Social History were reviewed in [employer] record.  ROS  The following are not  active complaints unless bolded sore throat, dysphagia, dental problems, itching, sneezing,  nasal congestion or excess/ purulent secretions, ear ache,   fever, chills, sweats, unintended wt loss, pleuritic or exertional cp, hemoptysis,  orthopnea pnd or leg swelling, presyncope, palpitations, heartburn, abdominal pain, anorexia, nausea, vomiting, diarrhea  or change in bowel or urinary habits, change in stools or urine, dysuria,hematuria,  rash, arthralgias, visual complaints, headache, numbness weakness or ataxia or problems with walking or coordination,  change in mood/affect or memory.                Objective:   Physical Exam  07/29/2012  105  >102 09/24/2012 > 10/26/2012  99 > 12/10/2012 99 > 104 03/14/2013 >  06/20/2013 107 > 109 07/28/2013     HEENT mild turbinate edema.  Oropharynx no thrush or excess pnd or cobblestoning.  No JVD or cervical adenopathy. Mild accessory muscle hypertrophy. Trachea midline, nl thryroid. Chest was hyperinflated by percussion with diminished breath sounds and moderate increased exp time without wheeze. Hoover sign positive at mid inspiration. Regular rate and rhythm without murmur gallop or rub or increase P2 or edema.  Abd: no hsm, nl excursion. Ext warm without cyanosis or clubbing.       CT 06/20/2013  Slight interval decrease in overall conspicuity of a nodular  lesion in the left upper lobe, as described in detail above.       Assessment & Plan:

## 2013-07-29 NOTE — Assessment & Plan Note (Signed)
-   Not seen on cxr  03/08/2012 and obvious on cxr 06/01/12 - 06/01/12 CT chest . Technically adequate exam showing no evidence for acute  pulmonary embolus.  2. Dependent changes in the lung bases bilaterally, favoring edema  or infectious process.  3. Right apical lung nodule is 1.0 cm in diameter. - CT 12/09/2012 1. Bullous emphysema with biapical scarring, right greater than left. 2. New spiculated nodular density in the left upper lobe. Follow- up CT chest without contrast is recommended in 3 months.  . 3. Resolved bilateral lower lobe air space disease with residual post infectious scarring. 4. Right nephrolithiasis. - CT  06/20/2013 1. Slight interval decrease in overall conspicuity of a nodular lesion in the left upper lobe, as described in detail above. Additional follow-up in 6 months is recommended > placed in tickle file for recall 12/18/13

## 2013-07-29 NOTE — Assessment & Plan Note (Signed)
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   

## 2013-07-29 NOTE — Assessment & Plan Note (Signed)
D/c bisoprolol 07/29/2013 at pt's request (feels much better off it than on it)  Will notify Dr Martinique she appears to very sensitive to even the lowest doses of most specific BB on market so rec increase cozaar to bid dosing if bp trends up off BB

## 2013-08-01 ENCOUNTER — Ambulatory Visit: Payer: Medicare Other | Admitting: Internal Medicine

## 2013-08-15 ENCOUNTER — Other Ambulatory Visit: Payer: Self-pay

## 2013-08-15 MED ORDER — ALBUTEROL SULFATE HFA 108 (90 BASE) MCG/ACT IN AERS: 2.0000 | INHALATION_SPRAY | Freq: Four times a day (QID) | RESPIRATORY_TRACT | Status: DC | PRN

## 2013-08-17 ENCOUNTER — Encounter: Payer: Self-pay | Admitting: Internal Medicine

## 2013-08-17 ENCOUNTER — Other Ambulatory Visit: Payer: Self-pay | Admitting: Cardiology

## 2013-08-17 ENCOUNTER — Ambulatory Visit (INDEPENDENT_AMBULATORY_CARE_PROVIDER_SITE_OTHER): Payer: Medicare Other | Admitting: Internal Medicine

## 2013-08-17 VITALS — BP 148/70 | HR 80 | Temp 98.0°F | Wt 107.5 lb

## 2013-08-17 DIAGNOSIS — E785 Hyperlipidemia, unspecified: Secondary | ICD-10-CM

## 2013-08-17 DIAGNOSIS — E559 Vitamin D deficiency, unspecified: Secondary | ICD-10-CM

## 2013-08-17 MED ORDER — TRAZODONE HCL 50 MG PO TABS: ORAL_TABLET | ORAL | Status: DC

## 2013-08-17 NOTE — Assessment & Plan Note (Signed)
Cont Vit D °

## 2013-08-17 NOTE — Assessment & Plan Note (Signed)
Continue with current prescription therapy as reflected on the Med list.  

## 2013-08-17 NOTE — Progress Notes (Signed)
   Subjective:    HPI  The patient is here for a wellness exam. C/o cramps. F/u chronic SOB better off Bisoprolol F/u COPD, CHF, OA  BP Readings from Last 3 Encounters:  08/17/13 148/70  07/28/13 130/70  07/27/13 129/62   Wt Readings from Last 3 Encounters:  08/17/13 107 lb 8 oz (48.762 kg)  07/28/13 109 lb 6.4 oz (49.624 kg)  07/06/13 107 lb (48.535 kg)       Review of Systems  Constitutional: Negative for fever, chills, diaphoresis, activity change, appetite change, fatigue and unexpected weight change.  HENT: Negative for congestion, dental problem, ear pain, hearing loss, mouth sores, postnasal drip, sinus pressure, sneezing, sore throat and voice change.   Eyes: Negative for pain and visual disturbance.  Respiratory: Positive for cough and shortness of breath. Negative for chest tightness, wheezing and stridor.   Cardiovascular: Negative for chest pain, palpitations and leg swelling.  Gastrointestinal: Negative for nausea, vomiting, abdominal pain, blood in stool, abdominal distention and rectal pain.  Genitourinary: Negative for dysuria, hematuria, decreased urine volume, vaginal bleeding, vaginal discharge, difficulty urinating, vaginal pain and menstrual problem.  Musculoskeletal: Positive for back pain. Negative for gait problem, joint swelling and neck pain.  Skin: Negative for color change, rash and wound.  Neurological: Negative for dizziness, tremors, syncope, speech difficulty and light-headedness.  Hematological: Negative for adenopathy.  Psychiatric/Behavioral: Negative for suicidal ideas, hallucinations, behavioral problems, confusion, sleep disturbance, dysphoric mood and decreased concentration. The patient is not nervous/anxious and is not hyperactive.        Objective:   Physical Exam  Constitutional: She appears well-developed. No distress.  HENT:  Head: Normocephalic.  Right Ear: External ear normal.  Left Ear: External ear normal.  Nose: Nose  normal.  Mouth/Throat: Oropharynx is clear and moist.  Eyes: Conjunctivae are normal. Pupils are equal, round, and reactive to light. Right eye exhibits no discharge. Left eye exhibits no discharge.  Neck: Normal range of motion. Neck supple. No JVD present. No tracheal deviation present. No thyromegaly present.  Cardiovascular: Normal rate, regular rhythm and normal heart sounds.   Pulmonary/Chest: No stridor. No respiratory distress. She has no wheezes.  Abdominal: Soft. Bowel sounds are normal. She exhibits no distension and no mass. There is no tenderness. There is no rebound and no guarding.  Musculoskeletal: She exhibits no edema and no tenderness.  Lymphadenopathy:    She has no cervical adenopathy.  Neurological: She displays normal reflexes. No cranial nerve deficit. She exhibits normal muscle tone. Coordination normal.  Skin: No rash noted. No erythema.  Psychiatric: She has a normal mood and affect. Her behavior is normal. Judgment and thought content normal.    Lab Results  Component Value Date   WBC 10.3 07/06/2013   HGB 13.6 07/06/2013   HCT 40.0 07/06/2013   PLT 292.0 07/06/2013   GLUCOSE 94 07/06/2013   CHOL 221* 07/06/2013   TRIG 200.0* 07/06/2013   HDL 60.90 07/06/2013   LDLDIRECT 126.5 06/14/2012   LDLCALC 120* 07/06/2013   ALT 23 07/06/2013   AST 32 07/06/2013   NA 136 07/06/2013   K 4.7 07/06/2013   CL 100 07/06/2013   CREATININE 0.9 07/06/2013   BUN 21 07/06/2013   CO2 30 07/06/2013   TSH 1.17 07/06/2013   INR 1.0 09/04/2011   HGBA1C 5.8* 06/04/2012         Assessment & Plan:

## 2013-08-17 NOTE — Progress Notes (Deleted)
Pre visit review using our clinic review tool, if applicable. No additional management support is needed unless otherwise documented below in the visit note. 

## 2013-08-29 ENCOUNTER — Telehealth: Payer: Self-pay | Admitting: *Deleted

## 2013-08-29 NOTE — Telephone Encounter (Signed)
Pt called states she has been fatigued since seh stopped taking the cardiac medication.  Please advise

## 2013-08-29 NOTE — Telephone Encounter (Signed)
Is she taking Losartan and Spironolactone? OV w/BMET Thx

## 2013-08-30 NOTE — Telephone Encounter (Signed)
Spoke with pt she states she is only on [place].  Appoint scheduled 5.8.15 at 11 am

## 2013-09-02 ENCOUNTER — Encounter: Payer: Self-pay | Admitting: Internal Medicine

## 2013-09-02 ENCOUNTER — Ambulatory Visit (INDEPENDENT_AMBULATORY_CARE_PROVIDER_SITE_OTHER): Payer: Medicare Other | Admitting: Internal Medicine

## 2013-09-02 ENCOUNTER — Telehealth: Payer: Self-pay | Admitting: Cardiology

## 2013-09-02 VITALS — BP 150/78 | HR 88 | Temp 97.7°F | Resp 16 | Wt 105.0 lb

## 2013-09-02 DIAGNOSIS — R0602 Shortness of breath: Secondary | ICD-10-CM

## 2013-09-02 DIAGNOSIS — I509 Heart failure, unspecified: Secondary | ICD-10-CM

## 2013-09-02 DIAGNOSIS — I2510 Atherosclerotic heart disease of native coronary artery without angina pectoris: Secondary | ICD-10-CM

## 2013-09-02 DIAGNOSIS — R0989 Other specified symptoms and signs involving the circulatory and respiratory systems: Secondary | ICD-10-CM

## 2013-09-02 DIAGNOSIS — R0600 Dyspnea, unspecified: Secondary | ICD-10-CM

## 2013-09-02 DIAGNOSIS — J449 Chronic obstructive pulmonary disease, unspecified: Secondary | ICD-10-CM

## 2013-09-02 DIAGNOSIS — R0609 Other forms of dyspnea: Secondary | ICD-10-CM

## 2013-09-02 DIAGNOSIS — I5022 Chronic systolic (congestive) heart failure: Secondary | ICD-10-CM

## 2013-09-02 MED ORDER — CLOPIDOGREL BISULFATE 75 MG PO TABS: 75.0000 mg | ORAL_TABLET | Freq: Every day | ORAL | Status: DC

## 2013-09-02 NOTE — Patient Instructions (Signed)
Go to ER if worse 

## 2013-09-02 NOTE — Assessment & Plan Note (Signed)
Continue with current prescription therapy as reflected on the Med list. Last ECHO was good

## 2013-09-02 NOTE — Assessment & Plan Note (Signed)
Myoview stress test Start Plavix Move up appt w/Dr Jordan EKG 

## 2013-09-02 NOTE — Telephone Encounter (Signed)
Received a call from patient she stated she saw Dr.Plotnikov for sob.Stated Dr.Plotnikov advised her to see Dr.Jordan.Dr.Jordan's schedule is full.She stated she did not want to see any one else,just Dr.Jordan.Will look at Dr.Jordan's schedule again to see where she can be worked in.Will call pt back.

## 2013-09-02 NOTE — Progress Notes (Signed)
Subjective:    HPI   F/u cramps - resolved off Pravachol and a water pill. F/u chronic SOB - it got better off Bisoprolol, it is back now. SOB and tired after small tasks (ie making bed) - has to rest x 10 min, then better again; sweats F/u COPD, CHF, OA  BP Readings from Last 3 Encounters:  09/02/13 150/78  08/17/13 148/70  07/28/13 130/70   Wt Readings from Last 3 Encounters:  09/02/13 105 lb (47.628 kg)  08/17/13 107 lb 8 oz (48.762 kg)  07/28/13 109 lb 6.4 oz (49.624 kg)       Review of Systems  Constitutional: Negative for fever, chills, diaphoresis, activity change, appetite change, fatigue and unexpected weight change.  HENT: Negative for congestion, dental problem, ear pain, hearing loss, mouth sores, postnasal drip, sinus pressure, sneezing, sore throat and voice change.   Eyes: Negative for pain and visual disturbance.  Respiratory: Positive for cough and shortness of breath. Negative for chest tightness, wheezing and stridor.   Cardiovascular: Negative for chest pain, palpitations and leg swelling.  Gastrointestinal: Negative for nausea, vomiting, abdominal pain, blood in stool, abdominal distention and rectal pain.  Genitourinary: Negative for dysuria, hematuria, decreased urine volume, vaginal bleeding, vaginal discharge, difficulty urinating, vaginal pain and menstrual problem.  Musculoskeletal: Positive for back pain. Negative for gait problem, joint swelling and neck pain.  Skin: Negative for color change, rash and wound.  Neurological: Negative for dizziness, tremors, syncope, speech difficulty and light-headedness.  Hematological: Negative for adenopathy.  Psychiatric/Behavioral: Negative for suicidal ideas, hallucinations, behavioral problems, confusion, sleep disturbance, dysphoric mood and decreased concentration. The patient is not nervous/anxious and is not hyperactive.        Objective:   Physical Exam  Constitutional: She appears well-developed. No  distress.  HENT:  Head: Normocephalic.  Right Ear: External ear normal.  Left Ear: External ear normal.  Nose: Nose normal.  Mouth/Throat: Oropharynx is clear and moist.  Eyes: Conjunctivae are normal. Pupils are equal, round, and reactive to light. Right eye exhibits no discharge. Left eye exhibits no discharge.  Neck: Normal range of motion. Neck supple. No JVD present. No tracheal deviation present. No thyromegaly present.  Cardiovascular: Normal rate, regular rhythm and normal heart sounds.   Pulmonary/Chest: No stridor. No respiratory distress. She has no wheezes.  Abdominal: Soft. Bowel sounds are normal. She exhibits no distension and no mass. There is no tenderness. There is no rebound and no guarding.  Musculoskeletal: She exhibits no edema and no tenderness.  Lymphadenopathy:    She has no cervical adenopathy.  Neurological: She displays normal reflexes. No cranial nerve deficit. She exhibits normal muscle tone. Coordination normal.  Skin: No rash noted. No erythema.  Psychiatric: She has a normal mood and affect. Her behavior is normal. Judgment and thought content normal.    Lab Results  Component Value Date   WBC 10.3 07/06/2013   HGB 13.6 07/06/2013   HCT 40.0 07/06/2013   PLT 292.0 07/06/2013   GLUCOSE 94 07/06/2013   CHOL 221* 07/06/2013   TRIG 200.0* 07/06/2013   HDL 60.90 07/06/2013   LDLDIRECT 126.5 06/14/2012   LDLCALC 120* 07/06/2013   ALT 23 07/06/2013   AST 32 07/06/2013   NA 136 07/06/2013   K 4.7 07/06/2013   CL 100 07/06/2013   CREATININE 0.9 07/06/2013   BUN 21 07/06/2013   CO2 30 07/06/2013   TSH 1.17 07/06/2013   INR 1.0 09/04/2011   HGBA1C 5.8* 06/04/2012  EKG - no new changes     Assessment & Plan:

## 2013-09-02 NOTE — Telephone Encounter (Signed)
New problem   Pt need to speak to nurse concerning need a soon appt to come in the office because of her high bp and SOB.

## 2013-09-02 NOTE — Assessment & Plan Note (Signed)
Myoview stress test Start Plavix Move up appt w/Dr Martinique EKG

## 2013-09-02 NOTE — Progress Notes (Signed)
Pre visit review using our clinic review tool, if applicable. No additional management support is needed unless otherwise documented below in the visit note. 

## 2013-09-02 NOTE — Assessment & Plan Note (Signed)
Continue with current prescription therapy as reflected on the Med list.  

## 2013-09-06 ENCOUNTER — Other Ambulatory Visit: Payer: Self-pay | Admitting: Internal Medicine

## 2013-09-06 ENCOUNTER — Telehealth: Payer: Self-pay

## 2013-09-06 MED ORDER — SPIRONOLACTONE 25 MG PO TABS
6.2500 mg | ORAL_TABLET | Freq: Every morning | ORAL | Status: DC
Start: 2013-09-06 — End: 2013-11-09

## 2013-09-06 NOTE — Telephone Encounter (Signed)
Patient came to office stating husband saw Dr.Plotnikov this morning and Dr.Plotnikov wanted her seen by any Dr.this week for sob.Appointment scheduled with Dr.Jordan Thursday 09/08/13 at 9:45 am.Advised to go to ER if needed.

## 2013-09-08 ENCOUNTER — Encounter: Payer: Self-pay | Admitting: Cardiology

## 2013-09-08 ENCOUNTER — Ambulatory Visit (INDEPENDENT_AMBULATORY_CARE_PROVIDER_SITE_OTHER): Payer: Medicare Other | Admitting: Cardiology

## 2013-09-08 VITALS — BP 160/83 | HR 84 | Ht 63.0 in | Wt 104.0 lb

## 2013-09-08 DIAGNOSIS — I5022 Chronic systolic (congestive) heart failure: Secondary | ICD-10-CM

## 2013-09-08 DIAGNOSIS — I739 Peripheral vascular disease, unspecified: Secondary | ICD-10-CM

## 2013-09-08 DIAGNOSIS — I509 Heart failure, unspecified: Secondary | ICD-10-CM

## 2013-09-08 DIAGNOSIS — I428 Other cardiomyopathies: Secondary | ICD-10-CM

## 2013-09-08 DIAGNOSIS — I447 Left bundle-branch block, unspecified: Secondary | ICD-10-CM

## 2013-09-08 NOTE — Progress Notes (Signed)
Amber Snow Date of Birth: [birth year] Medical Record #[id number]  History of Present Illness: Amber Snow is seen back today for a follow up visit. Has a history of nonischemic CM with EF of 35 to 40% by cath and echo from May of 2013. Nuclear at that time showed an EF of 27%. Echo in June of 2014 showed EF was 25 to 30%. She has had minimal nonobstructive disease by cardiac cath in May 2013. Other issues include COPD, past smoker, HTN, PVD, HLD, and bladder tumors.  Repeat Echo in November 2014 showed Normal EF. She had to stop taking aldactone due to leg cramps and bisoprolol due to persistent lightheadedness. These symptoms have resolved. She states she still doesn't feel well and complains of fatigue and some disequilibrium. She had extensive lab work done in March that was unremarkable.   Current Outpatient Prescriptions  Medication Sig Dispense Refill  . albuterol (PROVENTIL HFA;VENTOLIN HFA) 108 (90 BASE) MCG/ACT inhaler Inhale 2 puffs into the lungs every 6 (six) hours as needed. Shortness of breath  8.5 g  5  . baclofen (LIORESAL) 10 MG tablet Take 10 mg by mouth as needed.       Marland Kitchen BIOTIN PO Take 1 capsule by mouth daily.      . Cholecalciferol (VITAMIN D) 1000 UNITS capsule Take 1,000 Units by mouth daily.       . clopidogrel (PLAVIX) 75 MG tablet Take 1 tablet (75 mg total) by mouth daily.  30 tablet  11  . ibuprofen (ADVIL,MOTRIN) 200 MG tablet Take 600 mg by mouth every 6 (six) hours as needed for pain.      Marland Kitchen losartan (COZAAR) 50 MG tablet TAKE 1 TABLET BY MOUTH AT BEDTIME  90 tablet  1  . SYMBICORT 160-4.5 MCG/ACT inhaler INHALE 2 PUFFS INTO LUNGS TWICE A DAY  10.2 g  5  . traZODone (DESYREL) 50 MG tablet TAKE 1 TABLET BY MOUTH EVERY DAY  90 tablet  2  . zoledronic acid (RECLAST) 5 MG/100ML SOLN Inject 5 mg into the vein once. Every year      . pravastatin (PRAVACHOL) 40 MG tablet TAKE 1 TABLET BY MOUTH AT BEDTIME.  90 tablet  0  . spironolactone (ALDACTONE) 25 MG tablet  Take 0.5 tablets (12.5 mg total) by mouth every morning.  15 tablet  3   No current facility-administered medications for this visit.    Allergies  Allergen Reactions  . Barbiturates Rash  . Penicillins Rash  . Shrimp [Shellfish Allergy] Swelling  . Aspirin Nausea Only  . Beta Adrenergic Blockers     PT STATES TOLD TO AVOID DUE TO COUGH REACTION TO CARDIZEM  . Bisoprolol     SOB and dizzy  . Calcium Channel Blockers     PER PT TOLD TO AVOID DUE TO COUGH REACTION TO CARDIZEM  . Celecoxib Nausea Only  . Codeine Other (See Comments)    REACTION: excessive sleep  . Diltiazem Hcl Other (See Comments)    REACTION: cough  . Fluvastatin Sodium   . Ibandronate Sodium Other (See Comments)    REACTION: HAIR LOSS/NAIL CHANGES  . Oxycodone-Acetaminophen Nausea Only  . Rosuvastatin   . Simvastatin     Past Medical History  Diagnosis Date  . Diverticulosis of colon   . Hyperlipidemia   . History of nephrolithiasis   . Osteopenia   . Arthritis HANDS  . Chronic systolic CHF (congestive heart failure)   . Renal calculus, bilateral   . Bladder  cancer   . Bladder calculus   . COPD, moderate PULMOLOGIST-- DR Melvyn Novas    W/ EMPHYSEMA  (COPD  GOLD II)  . Wears hearing aid     BILATERAL  . Nonischemic cardiomyopathy   . LBBB (left bundle branch block)     CHRONIC  . PAC (premature atrial contraction)   . CAD (coronary artery disease) CARDIOLOGSIT-- DR Martinique    05-17-2013minimal nonobstructive CAD per cath with EF of 35 to 40% and normal filling pressures and right heart pressures  . Dyspnea on exertion   . Nodule of right lung     APICAL 1.0CM (MONITORED BY DR Melvyn Novas)  . Myalgia     Past Surgical History  Procedure Laterality Date  . Cosmetic surgery      eyelids  . Left long finger arthroplasty & pulley relase  02-17-2005  . Cysto/ left retrograde pyelogram/ left ureteral stent placement  10-16-2001    STONE  . Pulmonary function analysis  07-31-2009    MODERATE TO SEVERE  OBSTRUCTIVE AIRWAY DISEASE/ INSIGNIFICANT RESPONSE TO BRONCHODILATORS/ EMPHYSEMA PATTERN/ MILD DECREASE IN DIFFUSE CAPACITY FEF 25-75 CHANGED BY 8%  . Tubal ligation  AGE 23'S  . Tonsillectomy  CHILD  . Extracorporeal shock wave lithotripsy  2003  . Cataract extraction w/ intraocular lens  implant, bilateral  1998  . Transurethral resection of bladder tumor  07/01/2011    Procedure: TRANSURETHRAL RESECTION OF BLADDER TUMOR (TURBT);  Surgeon: Hanley Ben, MD;  Location: [facility];  Service: Urology;  Laterality: N/A;  CYSTO  . Cystoscopy  07/01/2011    Procedure: CYSTOSCOPY;  Surgeon: Hanley Ben, MD;  Location: [facility];  Service: Urology;  Laterality: N/A;  retrieval l of bladder stone  . Orif finger / thumb fracture    . Cystoscopy N/A 08/20/2012    Procedure: CYSTOSCOPY;  Surgeon: Hanley Ben, MD;  Location: [facility];  Service: Urology;  Laterality: N/A;  fulgeration of bladderm tumors  . Cardiac catheterization  09-12-2011  DR Martinique    MINIMAL NONOBSTRUCTIVE CAD/ MODERATE TO SEVERE LV DYSFUNCTION/  NORMAL RIGHT HEART PRESSURES AND LV FILLING PRESSURES  . Transthoracic echocardiogram  03-02-2013  DR Martinique    GRADE I DIASTOLIC DYSFUNCTION/   EF 60-65% (UP FROM 25-30% COMPARED TO LAST ECHO JUNE 2014 /  NORMAL LV SIZE AND WALL THICKNESS/ NORMAL LVSF/ MILD PV  . Cystoscopy with litholapaxy N/A 04/01/2013    Procedure: CYSTOSCOPY WITH HOLMIUM LASER OF  BLADDER CALCULUS;  Surgeon: Hanley Ben, MD;  Location: [place];  Service: Urology;  Laterality: N/A;  . Holmium laser application N/A 16/04/958    Procedure: HOLMIUM LASER APPLICATION;  Surgeon: Hanley Ben, MD;  Location: [place];  Service: Urology;  Laterality: N/A;  . Cystoscopy w/ retrogrades Right 04/01/2013    Procedure: CYSTOSCOPY WITH RETROGRADE PYELOGRAM;  Surgeon: Hanley Ben, MD;  Location: [place];  Service:  Urology;  Laterality: Right;    History  Smoking status  . Former Smoker -- 1.00 packs/day for 50 years  . Types: Cigarettes  . Quit date: 09/27/2011  Smokeless tobacco  . Never Used    History  Alcohol Use No    Family History  Problem Relation Age of Onset  . Heart disease Mother   . Hypertension Mother   . Hypertension Father   . Heart attack Father   . Heart disease Father   . Thyroid disease Sister   . Heart attack Sister   .  Stroke Sister   . COPD Sister   . Heart attack Brother   . Cancer Maternal Grandmother     breast cancer  . Diabetes Paternal Grandmother   . Asthma Neg Hx     Review of Systems: The review of systems is per the HPI.  All other systems were reviewed and are negative.  Physical Exam: BP 160/83  Pulse 84  Ht 5\' 3"  (1.6 m)  Wt 104 lb (47.174 kg)  BMI 18.43 kg/m2  SpO2 98% No orthostatic changes. Patient is very pleasant and in no acute distress. Skin is warm and dry. Color is normal.  HEENT is unremarkable.  Neck is supple. No masses. No JVD. Lungs are clear. Cardiac exam shows a regular rate and rhythm. Abdomen is soft. Extremities are without edema. Gait and ROM are intact. No gross neurologic deficits noted.  LABORATORY DATA:   Lab Results  Component Value Date   WBC 10.3 07/06/2013   HGB 13.6 07/06/2013   HCT 40.0 07/06/2013   PLT 292.0 07/06/2013   GLUCOSE 94 07/06/2013   CHOL 221* 07/06/2013   TRIG 200.0* 07/06/2013   HDL 60.90 07/06/2013   LDLDIRECT 126.5 06/14/2012   LDLCALC 120* 07/06/2013   ALT 23 07/06/2013   AST 32 07/06/2013   NA 136 07/06/2013   K 4.7 07/06/2013   CL 100 07/06/2013   CREATININE 0.9 07/06/2013   BUN 21 07/06/2013   CO2 30 07/06/2013   TSH 1.17 07/06/2013   INR 1.0 09/04/2011   HGBA1C 5.8* 06/04/2012   Echo Study Conclusions from November 2014  Study Conclusions  - Left ventricle: The cavity size was normal. Wall thickness was normal. Systolic function was normal. The estimated ejection fraction was in the  range of 60% to 65%. Wall motion was normal; there were no regional wall motion abnormalities. Doppler parameters are consistent with abnormal left ventricular relaxation (grade 1 diastolic dysfunction). - Ventricular septum: Septal motion showed paradox.  Ecg: NSR with LBBB.  Assessment / Plan:   1. Nonischemic CM with chronic systolic heart failure - EF normal by Echo in November. - No need for ICD. Now just on losartan. Other meds stopped due to side effects. Given Normal Echo in Nov. I don't think her current complaints are cardiac related.   2. Chronic LBBB   3. CAD - nonobstructive per last cath - managed medically - no symptoms.   4. COPD   I will follow up in 6 months.

## 2013-09-08 NOTE — Patient Instructions (Signed)
Continue your current therapy  I will see you in 6 months.   

## 2013-09-23 ENCOUNTER — Ambulatory Visit: Payer: Medicare Other | Admitting: Internal Medicine

## 2013-10-10 ENCOUNTER — Ambulatory Visit: Payer: Medicare Other | Admitting: Cardiology

## 2013-11-02 ENCOUNTER — Telehealth: Payer: Self-pay | Admitting: Internal Medicine

## 2013-11-02 DIAGNOSIS — J449 Chronic obstructive pulmonary disease, unspecified: Secondary | ICD-10-CM

## 2013-11-02 DIAGNOSIS — R911 Solitary pulmonary nodule: Secondary | ICD-10-CM

## 2013-11-02 NOTE — Telephone Encounter (Signed)
Order placed. Please advise PCC's thanks 

## 2013-11-02 NOTE — Telephone Encounter (Signed)
Per OV 07/31/13: - CT 06/20/2013 1. Slight interval decrease in overall conspicuity of a nodular lesion in the left upper lobe, as described in detail above. Additional follow-up in 6 months is recommended > placed in tickle file for recall 12/18/13 --  Please advise if CT is with or w/o contrast Dr. Melvyn Novas thanks

## 2013-11-02 NOTE — Telephone Encounter (Signed)
s contrast 

## 2013-11-02 NOTE — Telephone Encounter (Signed)
CT Chest scheduled for Monday 11/07/13 at 1:00 at [facility]. Pt is aware of appointment date, time and location. Patient was transferred up front to scheduled ROV with Dr. Melvyn Novas. Carlos Levering Cobb Given to [place] to obtain pre cert on CT Chest. Catha Gosselin Nothing else needed at this time. Rhonda J Cobb

## 2013-11-07 ENCOUNTER — Ambulatory Visit (INDEPENDENT_AMBULATORY_CARE_PROVIDER_SITE_OTHER)
Admission: RE | Admit: 2013-11-07 | Discharge: 2013-11-07 | Disposition: A | Discharge status: Home / Self Care | Payer: Medicare Other | Source: Ambulatory Visit | Attending: Internal Medicine | Admitting: Internal Medicine

## 2013-11-07 ENCOUNTER — Encounter: Payer: Self-pay | Admitting: Internal Medicine

## 2013-11-07 DIAGNOSIS — R911 Solitary pulmonary nodule: Secondary | ICD-10-CM

## 2013-11-07 DIAGNOSIS — J449 Chronic obstructive pulmonary disease, unspecified: Secondary | ICD-10-CM

## 2013-11-09 ENCOUNTER — Encounter: Payer: Self-pay | Admitting: Internal Medicine

## 2013-11-09 ENCOUNTER — Ambulatory Visit (INDEPENDENT_AMBULATORY_CARE_PROVIDER_SITE_OTHER): Payer: Medicare Other | Admitting: Internal Medicine

## 2013-11-09 VITALS — BP 130/82 | HR 81 | Temp 98.8°F | Ht 63.0 in | Wt 109.2 lb

## 2013-11-09 DIAGNOSIS — J449 Chronic obstructive pulmonary disease, unspecified: Secondary | ICD-10-CM

## 2013-11-09 DIAGNOSIS — R911 Solitary pulmonary nodule: Secondary | ICD-10-CM

## 2013-11-09 NOTE — Patient Instructions (Addendum)
Follow up in one year, call sooner if losing ground with exercise tolerance

## 2013-11-09 NOTE — Progress Notes (Signed)
Subjective:    Patient ID: Amber Snow, female    DOB: 06-05-1932  MRN: [id number]   Brief patient profile:  56 yowf with GOLD II copd by pfts 07/2009 quit smoking June 2013  due to progressive doe x 5 years worse since quit referred to pulmonary clinic 06/17/2012 by Dr Ignacia Palma p admit :  Admit date: 06/01/2012  Discharge date: 06/05/2012  Discharge Diagnoses:  Active Problems:  HYPERLIPIDEMIA  LEFT BUNDLE BRANCH BLOCK  COPD  LOW BACK PAIN, CHRONIC  LV dysfunction  Lung nodule > outpt pet rec COPD exacerbation  Nonischemic cardiomyopathy   COPD exacerbation  -CT angiogram of the chest was negative for pulmonary embolus  -Patient was started on Solu-Medrol, Levaquin, and bronchodilators  -Wheezing has improved with Solu-Medrol  -changed to by mouth prednisone without decompensation  -Continue every 6 hour albuterol-And when necessary for shortness of breath  -Continue levofloxacin--adjust Levaquin dose for renal function--final dose to be given on 06/06/2012 to complete 5 days  -I believe that her continuing dyspnea on exertion is a combination of her COPD exacerbation, cardiomyopathy, and deconditioning.  -The patient was subsequently weaned off of oxygen supplementation. Her oxygen saturation was 90-94% with activity on room air.  -She will go home with a prednisone wean starting with 50 mg on day #1 and decreasing by 10 mg daily    06/17/2012 1st pulmonary eval/ Amber Snow s/p hosp in January 2014 cc doe x 50 ft variable improvement with saba =proaire rec Symbicort 2 every 12 hours Spiriva once daily  Only use your albuterol (proaire) as a rescue medication Work on inhaler technique:   07/29/2012  ov/Amber Snow off cigs for a year f/u for copd/ R apical nodule Chief Complaint  Patient presents with  . Follow-up    Pt states breathing is the same, but overall feels better since last visit. Has occ prod cough with beige to clear sputum.    cc improved with less need for proaire and  walk at [place] still has trouble more than one room vacuum >>set up for CT chest ~11/2012   09/24/2012 [facility] follow up  Admitted 5/22 -26 for CAP and COPD exacerbation w/ fever and LLL opcaity.  Tx w/ IV abx, nebs and steroids  Discharged on Levaquin , steroid taper.  She is feeling better but still weak.  Reports is "so-so" w/ dyspnea, occ wheezing, dry hacking cough, chest congestion, general malaise.    rec No change rx  10/26/2012 f/u ov/Amber Snow re copd/ ? Recurrent pna Chief Complaint  Patient presents with  . Follow-up    Pt states her breathing is unchanged since the last visit. She c/o non prod cough no better since the last visit.     sob mailbox and back without stopping and using neb twice daily. Cough is dry and day > noct. rec Plan A = automatic = symbicort and spiriva PLan B= backup = proaire up to 2 puffs every 4 hours as needed, don't leave home without it Plan C = nebulizer only use if plan B doesn't work Plan D= Doctor call if not satisfied with Plan C or having to use a lot  Work on inhaler technique:   12/10/2012 f/u ov/Amber Snow re copd/ f/u abn ct Chief Complaint  Patient presents with  . Follow-up    Pt states no changes in her breathing. Cough has improved some. No new co's today.  outdoor walking difficult, but can do mall walking since returned from [place]  Cough is  rattling > beige, never  Bloody  rec No change rx Walk indoors up to 30 min daily but pace yourself   03/14/2013 f/u ov/Amber Snow re: copd GOLD II/ SPN LUL Chief Complaint  Patient presents with  . Followup with cxr    Pt states her breathing seems to be doing well. She denies any new co's today. Pt states she uses rescue inhaler 2-3 times per month on average.   freq cough, choking sensation esp in am assoc with dry mouth and feeling congested but no excess mucus or noct exac- overall much better though and can really tell when misses dose of symbicort Not really limited from desired  activities due to sob on smb and spiriva rec Stop spiriva for now to see what difference this makes    06/20/2013 f/u ov/Amber Snow re:   maint on symbicort 2 puffs 7 am and  5 pm Chief Complaint  Patient presents with  . Follow-up    Pt states having increased DOE for the past 3 months. She states she gets SOB just walking across the room, and gets lightheaded when this happens.   every day esp in evening can't get across the room more due to breathing than fatigue but both a factor No better on saba ? No better on spiriva  rec Add back the spiriva each am after the symbicort to see if afternoon breathing is easier, if not it's ok to stop. Remember the technique with the spiriva  We will put you in for one final limited CT of chest in 6 months with office visit same day    07/28/2013 f/u ov/Amber Snow re: COPD GOLD II on maint symb 160 2bid/ off spiriva as made no difference in doe Chief Complaint  Patient presents with  . Follow-up    Pt reports having increased SOB today. She relates this to her BP med Bisoprolol. She stopped med temporarily and breathing improved, but is worse since she started back on med.    off bisoprolol breathing and energy much better  Off bisoprolol needed saba  maybe once a week, back on it using saba once daily rec Stop bisoprolol No change resp meds    11/09/2013 f/u ov/Amber Snow re: GOLD II COPD/ maint on sym 160 2bid/ no change off spiriva Chief Complaint  Patient presents with  . Follow-up    Pt states that her breathing is unchanged since her last visit.  She is using rescue inhaler 3-4 times per wk on average.    Not limited by breathing from desired activities    No obvious day to day  variabilty or assoc cough cp or chest tightness, subjective wheeze overt sinus or hb symptoms. No unusual exp hx or h/o childhood pna/ asthma or knowledge of premature birth.   Sleeping ok without nocturnal  or early am exacerbation  of respiratory  c/o's or need for noct saba.  Also denies any obvious fluctuation of symptoms with weather or environmental changes or other aggravating or alleviating factors except as outlined above  Current Medications, Allergies, Past Medical History, Past Surgical History, Family History, and Social History were reviewed in [employer] record.  ROS  The following are not active complaints unless bolded sore throat, dysphagia, dental problems, itching, sneezing,  nasal congestion or excess/ purulent secretions, ear ache,   fever, chills, sweats, unintended wt loss, pleuritic or exertional cp, hemoptysis,  orthopnea pnd or leg swelling, presyncope, palpitations, heartburn, abdominal pain, anorexia, nausea, vomiting, diarrhea  or change  in bowel or urinary habits, change in stools or urine, dysuria,hematuria,  rash, arthralgias, visual complaints, headache, numbness weakness or ataxia or problems with walking or coordination,  change in mood/affect or memory.                Objective:   Physical Exam  07/29/2012  105  >102 09/24/2012 > 10/26/2012  99 > 12/10/2012 99 > 104 03/14/2013 > 06/20/2013 107 > 109 07/28/2013 > 11/09/2013  109     HEENT mild turbinate edema.  Oropharynx no thrush or excess pnd or cobblestoning.  No JVD or cervical adenopathy. Mild accessory muscle hypertrophy. Trachea midline, nl thryroid. Chest was hyperinflated by percussion with diminished breath sounds and moderate increased exp time without wheeze. Hoover sign positive at mid inspiration. Regular rate and rhythm without murmur gallop or rub or increase P2 or edema.  Abd: no hsm, nl excursion. Ext warm without cyanosis or clubbing.       CT 06/20/2013  Slight interval decrease in overall conspicuity of a nodular  lesion in the left upper lobe, as described in detail above.    Ct 11/07/13 Stable 7 mm left upper lobe nodule and other scattered bilateral  pulmonary nodules, which are likely benign. Continued followup by CT  recommended in  12 months          Assessment & Plan:

## 2013-11-11 ENCOUNTER — Ambulatory Visit: Payer: Medicare Other | Admitting: Internal Medicine

## 2013-11-11 NOTE — Assessment & Plan Note (Signed)
-   Not seen on cxr  03/08/2012 and obvious on cxr 06/01/12 - 06/01/12 CT chest . Technically adequate exam showing no evidence for acute  pulmonary embolus.  2. Dependent changes in the lung bases bilaterally, favoring edema  or infectious process.  3. Right apical lung nodule is 1.0 cm in diameter. - CT 12/09/2012  1. Bullous emphysema with biapical scarring, right greater than left. 2. New spiculated nodular density in the left upper lobe. Follow- up CT chest without contrast is recommended in 3 months.  . 3. Resolved bilateral lower lobe air space disease with residual post infectious scarring. 4. Right nephrolithiasis. - CT  06/20/2013 1. Slight interval decrease in overall conspicuity of a nodular lesion in the left upper lobe, as described in detail above. Additional follow-up in 6 months is recommended >  11/07/2013 > Stable 7 mm left upper lobe nodule and other scattered bilateral pulmonary nodules, which are likely benign. Continued followup by CT recommended in 12 months> placed in tickle file  For CT 11/08/14   Discussed in detail all the  indications, usual  risks and alternatives  relative to the benefits with patient who agrees to proceed with conservative f/u only

## 2013-11-11 NOTE — Assessment & Plan Note (Addendum)
-   PFT's 07/31/09 FEV1  1.36 (74%) ratio 45 and no better p B2,  DLCO 66%  - quit smoking June 2013 - PFT's 07/29/2012 FEV1 1.10 (63%) ratio 46 and no change p B2 and DLCO 57%  - 10/26/2012  Walked RA x 3 laps @ 185 ft each stopped due to end of study, no desats - Trial off spiriva 03/14/2013 > rechallenged 06/20/12 due to worse doe > d/cd around 06/26/13 and no worse off it - 07/28/2013 p extensive coaching HFA effectiveness =    90%  Adequate control on present rx, reviewed > no change in rx needed      Each maintenance medication was reviewed in detail including most importantly the difference between maintenance and as needed and under what circumstances the prns are to be used.  Please see instructions for details which were reviewed in writing and the patient given a copy.

## 2013-11-18 ENCOUNTER — Encounter: Payer: Self-pay | Admitting: Internal Medicine

## 2013-11-18 ENCOUNTER — Ambulatory Visit (INDEPENDENT_AMBULATORY_CARE_PROVIDER_SITE_OTHER): Payer: Medicare Other | Admitting: Internal Medicine

## 2013-11-18 VITALS — BP 160/76 | HR 76 | Temp 98.0°F | Resp 16 | Wt 106.0 lb

## 2013-11-18 DIAGNOSIS — I509 Heart failure, unspecified: Secondary | ICD-10-CM

## 2013-11-18 DIAGNOSIS — R911 Solitary pulmonary nodule: Secondary | ICD-10-CM

## 2013-11-18 DIAGNOSIS — J449 Chronic obstructive pulmonary disease, unspecified: Secondary | ICD-10-CM

## 2013-11-18 DIAGNOSIS — J4489 Other specified chronic obstructive pulmonary disease: Secondary | ICD-10-CM

## 2013-11-18 DIAGNOSIS — I5022 Chronic systolic (congestive) heart failure: Secondary | ICD-10-CM

## 2013-11-18 NOTE — Assessment & Plan Note (Signed)
Continue with current prescription therapy as reflected on the Med list.  

## 2013-11-18 NOTE — Assessment & Plan Note (Signed)
Watching  

## 2013-11-18 NOTE — Progress Notes (Signed)
Pre visit review using our clinic review tool, if applicable. No additional management support is needed unless otherwise documented below in the visit note. 

## 2013-11-18 NOTE — Progress Notes (Signed)
Subjective:    HPI   F/u cramps - resolved off Pravachol and a water pill. F/u chronic SOB - it got better off Bisoprolol.Previous problem w/ SOB and tired after small tasks (ie making bed) - is better F/u COPD, CHF, OA  BP Readings from Last 3 Encounters:  11/18/13 160/76  11/09/13 130/82  09/08/13 160/83   Wt Readings from Last 3 Encounters:  11/18/13 106 lb (48.081 kg)  11/09/13 109 lb 3.2 oz (49.533 kg)  09/08/13 104 lb (47.174 kg)       Review of Systems  Constitutional: Negative for fever, chills, diaphoresis, activity change, appetite change, fatigue and unexpected weight change.  HENT: Negative for congestion, dental problem, ear pain, hearing loss, mouth sores, postnasal drip, sinus pressure, sneezing, sore throat and voice change.   Eyes: Negative for pain and visual disturbance.  Respiratory: Positive for cough and shortness of breath. Negative for chest tightness, wheezing and stridor.   Cardiovascular: Negative for chest pain, palpitations and leg swelling.  Gastrointestinal: Negative for nausea, vomiting, abdominal pain, blood in stool, abdominal distention and rectal pain.  Genitourinary: Negative for dysuria, hematuria, decreased urine volume, vaginal bleeding, vaginal discharge, difficulty urinating, vaginal pain and menstrual problem.  Musculoskeletal: Positive for back pain. Negative for gait problem, joint swelling and neck pain.  Skin: Negative for color change, rash and wound.  Neurological: Negative for dizziness, tremors, syncope, speech difficulty and light-headedness.  Hematological: Negative for adenopathy.  Psychiatric/Behavioral: Negative for suicidal ideas, hallucinations, behavioral problems, confusion, sleep disturbance, dysphoric mood and decreased concentration. The patient is not nervous/anxious and is not hyperactive.        Objective:   Physical Exam  Constitutional: She appears well-developed. No distress.  HENT:  Head:  Normocephalic.  Right Ear: External ear normal.  Left Ear: External ear normal.  Nose: Nose normal.  Mouth/Throat: Oropharynx is clear and moist.  Eyes: Conjunctivae are normal. Pupils are equal, round, and reactive to light. Right eye exhibits no discharge. Left eye exhibits no discharge.  Neck: Normal range of motion. Neck supple. No JVD present. No tracheal deviation present. No thyromegaly present.  Cardiovascular: Normal rate, regular rhythm and normal heart sounds.   Pulmonary/Chest: No stridor. No respiratory distress. She has no wheezes.  Abdominal: Soft. Bowel sounds are normal. She exhibits no distension and no mass. There is no tenderness. There is no rebound and no guarding.  Musculoskeletal: She exhibits no edema and no tenderness.  Lymphadenopathy:    She has no cervical adenopathy.  Neurological: She displays normal reflexes. No cranial nerve deficit. She exhibits normal muscle tone. Coordination normal.  Skin: No rash noted. No erythema.  Psychiatric: She has a normal mood and affect. Her behavior is normal. Judgment and thought content normal.    Lab Results  Component Value Date   WBC 10.3 07/06/2013   HGB 13.6 07/06/2013   HCT 40.0 07/06/2013   PLT 292.0 07/06/2013   GLUCOSE 94 07/06/2013   CHOL 221* 07/06/2013   TRIG 200.0* 07/06/2013   HDL 60.90 07/06/2013   LDLDIRECT 126.5 06/14/2012   LDLCALC 120* 07/06/2013   ALT 23 07/06/2013   AST 32 07/06/2013   NA 136 07/06/2013   K 4.7 07/06/2013   CL 100 07/06/2013   CREATININE 0.9 07/06/2013   BUN 21 07/06/2013   CO2 30 07/06/2013   TSH 1.17 07/06/2013   INR 1.0 09/04/2011   HGBA1C 5.8* 06/04/2012   Chest CT EKG - no new changes     Assessment &  Plan:

## 2013-12-15 ENCOUNTER — Encounter: Payer: Self-pay | Admitting: Internal Medicine

## 2013-12-15 ENCOUNTER — Ambulatory Visit (INDEPENDENT_AMBULATORY_CARE_PROVIDER_SITE_OTHER): Payer: Medicare Other | Admitting: Internal Medicine

## 2013-12-15 VITALS — BP 150/62 | HR 76 | Temp 98.4°F | Resp 16 | Wt 108.0 lb

## 2013-12-15 DIAGNOSIS — S81809A Unspecified open wound, unspecified lower leg, initial encounter: Secondary | ICD-10-CM

## 2013-12-15 DIAGNOSIS — S81819A Laceration without foreign body, unspecified lower leg, initial encounter: Secondary | ICD-10-CM | POA: Insufficient documentation

## 2013-12-15 DIAGNOSIS — S81812A Laceration without foreign body, left lower leg, initial encounter: Secondary | ICD-10-CM

## 2013-12-15 DIAGNOSIS — Z23 Encounter for immunization: Secondary | ICD-10-CM

## 2013-12-15 MED ORDER — MUPIROCIN 2 % EX OINT: TOPICAL_OINTMENT | CUTANEOUS | Status: DC

## 2013-12-15 NOTE — Progress Notes (Signed)
  C/o leg laceration - dog was falling off her bed and paw nail got stuck in pt's leg skin - bled a lot yesterday  Exam: deep V shaped laceration L shin 3x2.5 cm  Procedure: laceration repair Indication: deep skin laceration Risks including infection, bleeding, scar formation, wound opening as well as benefits were explained to the patient in details. Wound was anesthetized with 3 cc of 2% Lidocaine with epinephrine and irrigated with the rest of the anesthetic. Skin was cleaned with betadine and dressed with sterile fenestrated field.   5 simple sutures and 1 matrass with 5.0 nylon were applied.  Wound was dressed with antibiotic ointment and dressed with a large bandaid Tolerated well. Complications: none.

## 2013-12-15 NOTE — Assessment & Plan Note (Signed)
V shaped laceration L shin 3x2.5 cm See procedure DT RTC 12 d

## 2013-12-15 NOTE — Patient Instructions (Signed)
Wound instructions: clean wound daily with soap and water, pat dry with a gauze or a kleenex tissue. Dress with antibiotic ointment and a band aid. Return to clinic for suture removal as instructed.

## 2013-12-15 NOTE — Progress Notes (Signed)
Pre visit review using our clinic review tool, if applicable. No additional management support is needed unless otherwise documented below in the visit note. 

## 2013-12-21 ENCOUNTER — Telehealth: Payer: Self-pay | Admitting: Internal Medicine

## 2013-12-21 NOTE — Telephone Encounter (Signed)
Pt stated that Dr. Camila Li put suture on her leg 12/15/13, Pt is concern because yesterday when she change her dressing and she notice that the suture is lose. She is changing 3 times a day and she was wondering if this is normal, what does she need to do. Please advise.

## 2013-12-22 ENCOUNTER — Encounter: Payer: Self-pay | Admitting: Internal Medicine

## 2013-12-22 ENCOUNTER — Ambulatory Visit (INDEPENDENT_AMBULATORY_CARE_PROVIDER_SITE_OTHER): Payer: Medicare Other | Admitting: Internal Medicine

## 2013-12-22 ENCOUNTER — Other Ambulatory Visit: Payer: Self-pay | Admitting: Internal Medicine

## 2013-12-22 VITALS — BP 126/88 | HR 90 | Temp 98.3°F | Resp 16 | Wt 109.0 lb

## 2013-12-22 DIAGNOSIS — S81812D Laceration without foreign body, left lower leg, subsequent encounter: Secondary | ICD-10-CM

## 2013-12-22 NOTE — Telephone Encounter (Signed)
I can look at it today at 11:15 if needed Change dressing qd Thx

## 2013-12-22 NOTE — Progress Notes (Signed)
Pre visit review using our clinic review tool, if applicable. No additional management support is needed unless otherwise documented below in the visit note. 

## 2013-12-22 NOTE — Assessment & Plan Note (Signed)
RTC on Wed

## 2013-12-22 NOTE — Telephone Encounter (Signed)
appt is set for today 12/23/13 @ 11:15am.

## 2013-12-22 NOTE — Progress Notes (Signed)
Clean wound.

## 2013-12-26 ENCOUNTER — Ambulatory Visit: Payer: Medicare Other | Admitting: Internal Medicine

## 2013-12-28 ENCOUNTER — Encounter: Payer: Self-pay | Admitting: Internal Medicine

## 2013-12-28 ENCOUNTER — Ambulatory Visit (INDEPENDENT_AMBULATORY_CARE_PROVIDER_SITE_OTHER): Payer: Medicare Other | Admitting: Internal Medicine

## 2013-12-28 VITALS — BP 148/78 | Temp 98.4°F | Wt 108.0 lb

## 2013-12-28 DIAGNOSIS — L03116 Cellulitis of left lower limb: Secondary | ICD-10-CM

## 2013-12-28 DIAGNOSIS — L03119 Cellulitis of unspecified part of limb: Secondary | ICD-10-CM

## 2013-12-28 DIAGNOSIS — L02419 Cutaneous abscess of limb, unspecified: Secondary | ICD-10-CM

## 2013-12-28 DIAGNOSIS — S81812S Laceration without foreign body, left lower leg, sequela: Secondary | ICD-10-CM

## 2013-12-28 DIAGNOSIS — IMO0002 Reserved for concepts with insufficient information to code with codable children: Secondary | ICD-10-CM

## 2013-12-28 MED ORDER — DOXYCYCLINE HYCLATE 100 MG PO TABS: 100.0000 mg | ORAL_TABLET | Freq: Two times a day (BID) | ORAL | Status: DC

## 2013-12-28 NOTE — Progress Notes (Signed)
  F/u leg laceration - dog was falling off her bed and paw nail got stuck in pt's leg skin  Exam: healing V shaped laceration L shin 3x2.5 cm w/sutures, clear d/c Slight erythema distally, tender  Procedure:  5 simple sutures and 1 matrass with 5.0 nylon were removed  Wound was dressed with antibiotic ointment and dressed with a large bandaid \ Tolerated well. Complications: none.

## 2013-12-28 NOTE — Progress Notes (Signed)
Pre visit review using our clinic review tool, if applicable. No additional management support is needed unless otherwise documented below in the visit note. 

## 2013-12-28 NOTE — Assessment & Plan Note (Signed)
Doxy empiric

## 2013-12-28 NOTE — Assessment & Plan Note (Signed)
Sutures removed.

## 2014-01-18 ENCOUNTER — Ambulatory Visit: Payer: Medicare Other | Admitting: Internal Medicine

## 2014-01-23 ENCOUNTER — Other Ambulatory Visit: Payer: Self-pay | Admitting: *Deleted

## 2014-01-23 MED ORDER — LOSARTAN POTASSIUM 50 MG PO TABS: ORAL_TABLET | ORAL | Status: DC

## 2014-01-26 ENCOUNTER — Ambulatory Visit (INDEPENDENT_AMBULATORY_CARE_PROVIDER_SITE_OTHER)
Admission: RE | Admit: 2014-01-26 | Discharge: 2014-01-26 | Disposition: A | Discharge status: Home / Self Care | Payer: Medicare Other | Source: Ambulatory Visit | Attending: Internal Medicine | Admitting: Internal Medicine

## 2014-01-26 ENCOUNTER — Encounter: Payer: Self-pay | Admitting: Internal Medicine

## 2014-01-26 ENCOUNTER — Ambulatory Visit (INDEPENDENT_AMBULATORY_CARE_PROVIDER_SITE_OTHER): Payer: Medicare Other | Admitting: Internal Medicine

## 2014-01-26 VITALS — BP 158/80 | HR 99 | Temp 97.9°F | Resp 14 | Wt 110.0 lb

## 2014-01-26 DIAGNOSIS — I10 Essential (primary) hypertension: Secondary | ICD-10-CM

## 2014-01-26 DIAGNOSIS — R0600 Dyspnea, unspecified: Secondary | ICD-10-CM

## 2014-01-26 DIAGNOSIS — R42 Dizziness and giddiness: Secondary | ICD-10-CM

## 2014-01-26 DIAGNOSIS — J441 Chronic obstructive pulmonary disease with (acute) exacerbation: Secondary | ICD-10-CM

## 2014-01-26 DIAGNOSIS — R609 Edema, unspecified: Secondary | ICD-10-CM

## 2014-01-26 MED ORDER — PREDNISONE 20 MG PO TABS: 20.0000 mg | ORAL_TABLET | Freq: Every day | ORAL | Status: DC

## 2014-01-26 NOTE — Progress Notes (Signed)
Pre visit review using our clinic review tool, if applicable. No additional management support is needed unless otherwise documented below in the visit note. 

## 2014-01-26 NOTE — Progress Notes (Signed)
   Subjective:    Patient ID: Amber Snow, female    DOB: 1932/06/21, 80 y.o.   MRN: [id number]  HPI   She's had increasing shortness of breath for 2 weeks without any specific trigger such as infection , barometric pressure change or environmental exposures.. She is now short of breath walking across the room or talking on the phone. The shortness of breath is associated with lightheadedness.Sputum is scant in volume.  She is on Symbicort bid & albuterol q 6 hrs prn.  She's also noted increased edema over the feet.  She is followed by Dr. Melvyn Novas in Pulmonology but has not seen him for several months.  She describes hot flashes as well as the production of scant sputum.  She has intermittent cramps in her hands and legs.  She's had no symptoms of upper respiratory tract infection.    Review of Systems Frontal headache, facial pain , nasal purulence, dental pain, sore throat , otic pain or otic discharge denied. Hands & legs cramp intermittently. Hot flashes w/o chills or fever present intermittently. No fever , chills or sweats. She has a shallow ulcer over the shin which is healing slowly.     Objective:   Physical Exam   Pertinent or positive findings include: She appears thin but adequately nourished. She appears somewhat chronically ill. She has bilateral hearing aids. She is using accessory strap muscles of the neck for respirations Breath sounds are decreased in all lung fields. She has a distant S4; clinically there is no evidence of atrial fibrillation There is no neck vein distention or hepatojugular reflux when supine. She does exhibit some upper airway expiratory wheezing when supine. 1+ pitting edema of the feet is present. Pedal pulses are somewhat decreased. 12 X 14 mm shallow shin ulcer w/o purulence or cellulitis.  Eyes: No conjunctival inflammation or scleral icterus is present. Oral exam: Dental hygiene is good. Lips and gums are healthy appearing.There  is no oropharyngeal erythema or exudate noted.  Heart:  Normal rate and regular rhythm. S1 and S2 normal without gallop, murmur, click, rub or other extra sounds   Abdomen: bowel sounds normal, soft and non-tender without masses, organomegaly or hernias noted.  No guarding or rebound.  Vascular : all pulses equal ; no bruits present. Skin:Warm & dry.  Intact without suspicious lesions or rashes ; no jaundice or tenting Lymphatic: No lymphadenopathy is noted about the head, neck, axilla           Assessment & Plan:  #1 progressive dyspnea  #2 end stage COAD #3 edema #4 shin ulcer #5 HTN See orders

## 2014-01-26 NOTE — Patient Instructions (Addendum)
Your next office appointment will be determined based upon review of your pending labs & x-rays. Those instructions will be transmitted to you  by mail Followup as needed for your acute issue. Please report any significant change in your symptoms. Dip gauze in  sterile saline and applied to the wound twice a day. Cover the wound with Telfa , non stick dressing  without any antibiotic ointment. The saline can be purchased at the drugstore or you can make your own .Boil cup of salt in a gallon of water. Store mixture  in a clean container.Report Warning  signs as discussed (red streaks, pus, fever, increasing pain). Restrict salt as discussed.

## 2014-01-27 ENCOUNTER — Other Ambulatory Visit (INDEPENDENT_AMBULATORY_CARE_PROVIDER_SITE_OTHER): Payer: Medicare Other

## 2014-01-27 ENCOUNTER — Telehealth: Payer: Self-pay

## 2014-01-27 DIAGNOSIS — R609 Edema, unspecified: Secondary | ICD-10-CM

## 2014-01-27 DIAGNOSIS — R42 Dizziness and giddiness: Secondary | ICD-10-CM

## 2014-01-27 DIAGNOSIS — J441 Chronic obstructive pulmonary disease with (acute) exacerbation: Secondary | ICD-10-CM

## 2014-01-27 LAB — CBC WITH DIFFERENTIAL/PLATELET
BASOS ABS: 0.1 10*3/uL (ref 0.0–0.1)
Basophils Relative: 0.6 % (ref 0.0–3.0)
EOS PCT: 0 % (ref 0.0–5.0)
Eosinophils Absolute: 0 10*3/uL (ref 0.0–0.7)
HEMATOCRIT: 38 % (ref 36.0–46.0)
Hemoglobin: 12.9 g/dL (ref 12.0–15.0)
LYMPHS ABS: 1 10*3/uL (ref 0.7–4.0)
Lymphocytes Relative: 8.1 % — ABNORMAL LOW (ref 12.0–46.0)
MCHC: 34.1 g/dL (ref 30.0–36.0)
MCV: 93.4 fl (ref 78.0–100.0)
MONO ABS: 0.4 10*3/uL (ref 0.1–1.0)
MONOS PCT: 3 % (ref 3.0–12.0)
Neutro Abs: 10.4 10*3/uL — ABNORMAL HIGH (ref 1.4–7.7)
Neutrophils Relative %: 88.3 % — ABNORMAL HIGH (ref 43.0–77.0)
PLATELETS: 313 10*3/uL (ref 150.0–400.0)
RBC: 4.07 Mil/uL (ref 3.87–5.11)
RDW: 13.2 % (ref 11.5–15.5)
WBC: 11.8 10*3/uL — AB (ref 4.0–10.5)

## 2014-01-27 LAB — BASIC METABOLIC PANEL
BUN: 22 mg/dL (ref 6–23)
CALCIUM: 9.4 mg/dL (ref 8.4–10.5)
CO2: 27 meq/L (ref 19–32)
Chloride: 103 mEq/L (ref 96–112)
Creatinine, Ser: 1 mg/dL (ref 0.4–1.2)
GFR: 57.5 mL/min — AB (ref >60.00)
Glucose, Bld: 128 mg/dL — ABNORMAL HIGH (ref 70–99)
Potassium: 3.9 mEq/L (ref 3.5–5.1)
Sodium: 138 mEq/L (ref 135–145)

## 2014-01-27 NOTE — Telephone Encounter (Signed)
Amber Snow with [facility] called needing cbc and bmet order placed again. She states the orders entered already (yesterday) have expired.

## 2014-01-31 ENCOUNTER — Telehealth: Payer: Self-pay | Admitting: *Deleted

## 2014-01-31 NOTE — Telephone Encounter (Signed)
A user error has taken place: open by mistake.../lmb  

## 2014-02-01 ENCOUNTER — Telehealth: Payer: Self-pay | Admitting: *Deleted

## 2014-02-01 NOTE — Telephone Encounter (Signed)
Left msg on triage requesting lab & xray results. Called pt back inform her md has mail out result letter on Monday, but gave her md response on labs & xray...Amber Snow

## 2014-02-17 ENCOUNTER — Other Ambulatory Visit: Payer: Self-pay

## 2014-02-17 DIAGNOSIS — Z1231 Encounter for screening mammogram for malignant neoplasm of breast: Secondary | ICD-10-CM

## 2014-02-20 ENCOUNTER — Encounter: Payer: Self-pay | Admitting: Internal Medicine

## 2014-02-20 ENCOUNTER — Ambulatory Visit (INDEPENDENT_AMBULATORY_CARE_PROVIDER_SITE_OTHER): Payer: Medicare Other | Admitting: Internal Medicine

## 2014-02-20 VITALS — BP 138/80 | HR 71 | Temp 97.5°F | Wt 108.0 lb

## 2014-02-20 DIAGNOSIS — J441 Chronic obstructive pulmonary disease with (acute) exacerbation: Secondary | ICD-10-CM

## 2014-02-20 MED ORDER — PREDNISOLONE 5 MG PO TABS: 5.0000 mg | ORAL_TABLET | Freq: Every day | ORAL | Status: DC

## 2014-02-20 MED ORDER — METHYLPREDNISOLONE ACETATE 80 MG/ML IJ SUSP
80.0000 mg | Freq: Once | INTRAMUSCULAR | Status: AC
  Administered 2014-02-20: 80 mg via INTRAMUSCULAR

## 2014-02-20 MED ORDER — UMECLIDINIUM-VILANTEROL 62.5-25 MCG/INH IN AEPB: 1.0000 | INHALATION_SPRAY | Freq: Every day | RESPIRATORY_TRACT | Status: DC

## 2014-02-20 NOTE — Assessment & Plan Note (Signed)
10/15 - steroids help worse off steroids

## 2014-02-20 NOTE — Progress Notes (Signed)
Pre visit review using our clinic review tool, if applicable. No additional management support is needed unless otherwise documented below in the visit note. 

## 2014-02-21 ENCOUNTER — Telehealth: Payer: Self-pay | Admitting: *Deleted

## 2014-02-21 NOTE — Telephone Encounter (Signed)
Called pharmacy spoke with Amber Snow gave her md response...Amber Snow

## 2014-02-21 NOTE — Telephone Encounter (Signed)
Deltasone 5 mg tabs Thx

## 2014-02-21 NOTE — Telephone Encounter (Signed)
Pharmacist left msg on triage stating they received electronic script for prednisolone 5 mg. Wanting to verify if md want prednisone tab instead. The prednisolone come in liquid form...Amber Snow

## 2014-02-24 ENCOUNTER — Ambulatory Visit
Admission: RE | Admit: 2014-02-24 | Discharge: 2014-02-24 | Disposition: A | Discharge status: Home / Self Care | Payer: Medicare Other | Source: Ambulatory Visit

## 2014-02-24 DIAGNOSIS — Z1231 Encounter for screening mammogram for malignant neoplasm of breast: Secondary | ICD-10-CM

## 2014-03-10 ENCOUNTER — Encounter: Payer: Self-pay | Admitting: Cardiology

## 2014-03-10 ENCOUNTER — Ambulatory Visit (INDEPENDENT_AMBULATORY_CARE_PROVIDER_SITE_OTHER): Payer: Medicare Other | Admitting: Cardiology

## 2014-03-10 VITALS — BP 145/70 | HR 90 | Ht 63.0 in | Wt 106.8 lb

## 2014-03-10 DIAGNOSIS — I2510 Atherosclerotic heart disease of native coronary artery without angina pectoris: Secondary | ICD-10-CM

## 2014-03-10 DIAGNOSIS — I447 Left bundle-branch block, unspecified: Secondary | ICD-10-CM

## 2014-03-10 DIAGNOSIS — I5022 Chronic systolic (congestive) heart failure: Secondary | ICD-10-CM

## 2014-03-10 NOTE — Patient Instructions (Signed)
We will schedule you for an Echocardiogram  Continue your current therapy   

## 2014-03-10 NOTE — Progress Notes (Signed)
Amber Snow Date of Birth: 12/16/1932 Medical Record #[id number]  History of Present Illness: Amber Snow is seen back today for a follow up cardiomyopathy. Has a history of nonischemic CM with EF of 35 to 40% by cath and echo from May of 2013. Nuclear at that time showed an EF of 27%. Echo in June of 2014 showed EF was 25 to 30%. She has had minimal nonobstructive disease by cardiac cath in May 2013. Other issues include COPD, past smoker, HTN, PVD, HLD, and bladder tumors.  Repeat Echo in November 2014 showed Normal EF. She had to stop taking aldactone due to leg cramps and bisoprolol due to persistent lightheadedness. These symptoms have resolved. She reports symptoms of increased SOB over the past two months. Some wheezing. Now on prednisone 5 mg but states she doesn't note improvement unless she takes 20 mg daily. She did have 80 mg IM injection on Oct. 26 without much change. No edema or orthopnea.  Current Outpatient Prescriptions  Medication Sig Dispense Refill  . albuterol (PROVENTIL HFA;VENTOLIN HFA) 108 (90 BASE) MCG/ACT inhaler Inhale 2 puffs into the lungs every 6 (six) hours as needed. Shortness of breath 8.5 g 5  . baclofen (LIORESAL) 10 MG tablet Take 10 mg by mouth as needed.     Marland Kitchen BIOTIN PO Take 1 capsule by mouth daily.    . Cholecalciferol (VITAMIN D) 1000 UNITS capsule Take 1,000 Units by mouth daily.     Marland Kitchen FLUZONE HIGH-DOSE 0.5 ML SUSY   0  . ibuprofen (ADVIL,MOTRIN) 200 MG tablet Take 600 mg by mouth every 6 (six) hours as needed for pain.    Marland Kitchen losartan (COZAAR) 50 MG tablet TAKE 1 TABLET BY MOUTH AT BEDTIME 90 tablet 0  . prednisoLONE 5 MG TABS tablet Take 1 tablet (5 mg total) by mouth daily. 100 each 5  . SYMBICORT 160-4.5 MCG/ACT inhaler Inhale 2 Inhalers into the lungs 2 (two) times daily.  5  . traZODone (DESYREL) 50 MG tablet TAKE 1 TABLET BY MOUTH EVERY DAY 90 tablet 2  . zoledronic acid (RECLAST) 5 MG/100ML SOLN Inject 5 mg into the vein once. Every year      No current facility-administered medications for this visit.    Allergies  Allergen Reactions  . Barbiturates Rash  . Penicillins Rash  . Shrimp [Shellfish Allergy] Swelling  . Aspirin Nausea Only  . Beta Adrenergic Blockers     PT STATES TOLD TO AVOID DUE TO COUGH REACTION TO CARDIZEM  . Bisoprolol     SOB and dizzy  . Calcium Channel Blockers     PER PT TOLD TO AVOID DUE TO COUGH REACTION TO CARDIZEM  . Celecoxib Nausea Only  . Codeine Other (See Comments)    REACTION: excessive sleep  . Diltiazem Hcl Other (See Comments)    REACTION: cough  . Fluvastatin Sodium   . Ibandronate Sodium Other (See Comments)    REACTION: HAIR LOSS/NAIL CHANGES  . Oxycodone-Acetaminophen Nausea Only  . Plavix [Clopidogrel Bisulfate] Itching  . Rosuvastatin   . Simvastatin     Past Medical History  Diagnosis Date  . Diverticulosis of colon   . Hyperlipidemia   . History of nephrolithiasis   . Osteopenia   . Arthritis HANDS  . Chronic systolic CHF (congestive heart failure)   . Renal calculus, bilateral   . Bladder cancer   . Bladder calculus   . COPD, moderate PULMOLOGIST-- DR Melvyn Novas    W/ EMPHYSEMA  (COPD  GOLD  II)  . Wears hearing aid     BILATERAL  . Nonischemic cardiomyopathy   . LBBB (left bundle branch block)     CHRONIC  . PAC (premature atrial contraction)   . CAD (coronary artery disease) CARDIOLOGSIT-- DR Martinique    05-17-2013minimal nonobstructive CAD per cath with EF of 35 to 40% and normal filling pressures and right heart pressures  . Dyspnea on exertion   . Nodule of right lung     APICAL 1.0CM (MONITORED BY DR Melvyn Novas)  . Myalgia     Past Surgical History  Procedure Laterality Date  . Cosmetic surgery      eyelids  . Left long finger arthroplasty & pulley relase  02-17-2005  . Cysto/ left retrograde pyelogram/ left ureteral stent placement  10-16-2001    STONE  . Pulmonary function analysis  07-31-2009    MODERATE TO SEVERE OBSTRUCTIVE AIRWAY DISEASE/  INSIGNIFICANT RESPONSE TO BRONCHODILATORS/ EMPHYSEMA PATTERN/ MILD DECREASE IN DIFFUSE CAPACITY FEF 25-75 CHANGED BY 8%  . Tubal ligation  AGE 36'S  . Tonsillectomy  CHILD  . Extracorporeal shock wave lithotripsy  2003  . Cataract extraction w/ intraocular lens  implant, bilateral  1998  . Transurethral resection of bladder tumor  07/01/2011    Procedure: TRANSURETHRAL RESECTION OF BLADDER TUMOR (TURBT);  Surgeon: Hanley Ben, MD;  Location: [facility];  Service: Urology;  Laterality: N/A;  CYSTO  . Cystoscopy  07/01/2011    Procedure: CYSTOSCOPY;  Surgeon: Hanley Ben, MD;  Location: [facility];  Service: Urology;  Laterality: N/A;  retrieval l of bladder stone  . Orif finger / thumb fracture    . Cystoscopy N/A 08/20/2012    Procedure: CYSTOSCOPY;  Surgeon: Hanley Ben, MD;  Location[facility];  Service: Urology;  Laterality: N/A;  fulgeration of bladderm tumors  . Cardiac catheterization  09-12-2011  DR Martinique    MINIMAL NONOBSTRUCTIVE CAD/ MODERATE TO SEVERE LV DYSFUNCTION/  NORMAL RIGHT HEART PRESSURES AND LV FILLING PRESSURES  . Transthoracic echocardiogram  03-02-2013  DR Martinique    GRADE I DIASTOLIC DYSFUNCTION/   EF 60-65% (UP FROM 25-30% COMPARED TO LAST ECHO JUNE 2014 /  NORMAL LV SIZE AND WALL THICKNESS/ NORMAL LVSF/ MILD PV  . Cystoscopy with litholapaxy N/A 04/01/2013    Procedure: CYSTOSCOPY WITH HOLMIUM LASER OF  BLADDER CALCULUS;  Surgeon: Hanley Ben, MD;  Location: [place];  Service: Urology;  Laterality: N/A;  . Holmium laser application N/A 06/02/4268    Procedure: HOLMIUM LASER APPLICATION;  Surgeon: Hanley Ben, MD;  Location: [place];  Service: Urology;  Laterality: N/A;  . Cystoscopy w/ retrogrades Right 04/01/2013    Procedure: CYSTOSCOPY WITH RETROGRADE PYELOGRAM;  Surgeon: Hanley Ben, MD;  Location: [place];  Service: Urology;  Laterality: Right;     History  Smoking status  . Former Smoker -- 1.00 packs/day for 50 years  . Types: Cigarettes  . Quit date: 09/27/2011  Smokeless tobacco  . Never Used    History  Alcohol Use No    Family History  Problem Relation Age of Onset  . Heart disease Mother   . Hypertension Mother   . Hypertension Father   . Heart attack Father   . Heart disease Father   . Thyroid disease Sister   . Heart attack Sister   . Stroke Sister   . COPD Sister   . Heart attack Brother   . Cancer Maternal Grandmother  breast cancer  . Diabetes Paternal Grandmother   . Asthma Neg Hx     Review of Systems: The review of systems is per the HPI.  All other systems were reviewed and are negative.  Physical Exam: BP 145/70 mmHg  Pulse 90  Ht 5\' 3"  (1.6 m)  Wt 106 lb 12.8 oz (48.444 kg)  BMI 18.92 kg/m2 No orthostatic changes. Patient is very pleasant and in no acute distress. Skin is warm and dry. Color is normal.  HEENT is unremarkable.  Neck is supple. No masses. No JVD. Lungs are clear with diffusely diminished BS. Cardiac exam shows a regular rate and rhythm. Normal S1-2. No gallop or murmur. Abdomen is soft. Extremities are without edema. Gait and ROM are intact. No gross neurologic deficits noted.  LABORATORY DATA:   Lab Results  Component Value Date   WBC 11.8* 01/27/2014   HGB 12.9 01/27/2014   HCT 38.0 01/27/2014   PLT 313.0 01/27/2014   GLUCOSE 128* 01/27/2014   CHOL 221* 07/06/2013   TRIG 200.0* 07/06/2013   HDL 60.90 07/06/2013   LDLDIRECT 126.5 06/14/2012   LDLCALC 120* 07/06/2013   ALT 23 07/06/2013   AST 32 07/06/2013   NA 138 01/27/2014   K 3.9 01/27/2014   CL 103 01/27/2014   CREATININE 1.0 01/27/2014   BUN 22 01/27/2014   CO2 27 01/27/2014   TSH 1.17 07/06/2013   INR 1.0 09/04/2011   HGBA1C 5.8* 06/04/2012   Echo Study Conclusions from November 2014  Study Conclusions  - Left ventricle: The cavity size was normal. Wall thickness was normal. Systolic  function was normal. The estimated ejection fraction was in the range of 60% to 65%. Wall motion was normal; there were no regional wall motion abnormalities. Doppler parameters are consistent with abnormal left ventricular relaxation (grade 1 diastolic dysfunction). - Ventricular septum: Septal motion showed paradox.   Assessment / Plan:   1. Nonischemic CM with chronic systolic heart failure - EF normal by Echo in November 2014. - Now just on losartan. Other meds stopped due to side effects. I suspect her recent symptoms are related to COPD but we will repeat an Echo to make sure she hasn't relapsed with her cardiomyopathy.   2. Chronic LBBB   3. CAD - nonobstructive per last cath - managed medically - no symptoms.   4. COPD - if Echo normal would recommend follow up with Dr. Melvyn Novas.  I will follow up in 6 months.

## 2014-03-15 ENCOUNTER — Ambulatory Visit (HOSPITAL_COMMUNITY)
Admission: RE | Admit: 2014-03-15 | Discharge: 2014-03-15 | Disposition: A | Discharge status: Home / Self Care | Payer: Medicare Other | Source: Ambulatory Visit | Attending: Cardiovascular Disease | Admitting: Cardiovascular Disease

## 2014-03-15 DIAGNOSIS — Z87891 Personal history of nicotine dependence: Secondary | ICD-10-CM | POA: Diagnosis not present

## 2014-03-15 DIAGNOSIS — I5022 Chronic systolic (congestive) heart failure: Secondary | ICD-10-CM

## 2014-03-15 DIAGNOSIS — I2510 Atherosclerotic heart disease of native coronary artery without angina pectoris: Secondary | ICD-10-CM | POA: Diagnosis present

## 2014-03-15 DIAGNOSIS — I10 Essential (primary) hypertension: Secondary | ICD-10-CM | POA: Insufficient documentation

## 2014-03-15 DIAGNOSIS — Z8249 Family history of ischemic heart disease and other diseases of the circulatory system: Secondary | ICD-10-CM | POA: Insufficient documentation

## 2014-03-15 DIAGNOSIS — I447 Left bundle-branch block, unspecified: Secondary | ICD-10-CM

## 2014-03-15 DIAGNOSIS — E785 Hyperlipidemia, unspecified: Secondary | ICD-10-CM | POA: Diagnosis not present

## 2014-03-15 NOTE — Progress Notes (Signed)
2D Echocardiogram Complete.  03/15/2014   Amber Snow, [place]

## 2014-03-17 ENCOUNTER — Other Ambulatory Visit: Payer: Self-pay | Admitting: Obstetrics

## 2014-03-17 DIAGNOSIS — M8580 Other specified disorders of bone density and structure, unspecified site: Secondary | ICD-10-CM

## 2014-03-20 ENCOUNTER — Telehealth: Payer: Self-pay | Admitting: Cardiology

## 2014-03-20 NOTE — Telephone Encounter (Signed)
Pt would like her Echo results form 03-15-14 please.

## 2014-03-20 NOTE — Telephone Encounter (Signed)
Returned call to patient 03/16/14 echo results given.

## 2014-03-21 ENCOUNTER — Ambulatory Visit (INDEPENDENT_AMBULATORY_CARE_PROVIDER_SITE_OTHER): Payer: Medicare Other | Admitting: Internal Medicine

## 2014-03-21 ENCOUNTER — Encounter: Payer: Self-pay | Admitting: Internal Medicine

## 2014-03-21 VITALS — BP 122/60 | HR 99 | Temp 97.9°F | Wt 108.0 lb

## 2014-03-21 DIAGNOSIS — J441 Chronic obstructive pulmonary disease with (acute) exacerbation: Secondary | ICD-10-CM

## 2014-03-21 DIAGNOSIS — I5022 Chronic systolic (congestive) heart failure: Secondary | ICD-10-CM

## 2014-03-21 DIAGNOSIS — I2510 Atherosclerotic heart disease of native coronary artery without angina pectoris: Secondary | ICD-10-CM

## 2014-03-21 DIAGNOSIS — S81812S Laceration without foreign body, left lower leg, sequela: Secondary | ICD-10-CM

## 2014-03-21 NOTE — Assessment & Plan Note (Signed)
healed 

## 2014-03-21 NOTE — Assessment & Plan Note (Signed)
Continue with current prescription therapy as reflected on the Med list.  

## 2014-03-21 NOTE — Progress Notes (Signed)
Pre visit review using our clinic review tool, if applicable. No additional management support is needed unless otherwise documented below in the visit note. 

## 2014-03-21 NOTE — Assessment & Plan Note (Signed)
Continue with current prescription therapy as reflected on the Med list. On Symbicort

## 2014-03-21 NOTE — Progress Notes (Signed)
Subjective:    HPI  C/o cough on Anoro - stopped Treated for a UTI by Dr Janice Norrie recently F/u cramps - resolved off Pravachol and a water pill. F/u chronic SOB - it got better off Bisoprolol. Previous problem w/ SOB and tired after small tasks (ie making bed) - is better F/u COPD, CHF, OA  BP Readings from Last 3 Encounters:  03/21/14 122/60  03/10/14 145/70  02/20/14 138/80   Wt Readings from Last 3 Encounters:  03/21/14 108 lb (48.988 kg)  03/10/14 106 lb 12.8 oz (48.444 kg)  02/20/14 108 lb (48.988 kg)       Review of Systems  Constitutional: Negative for fever, chills, diaphoresis, activity change, appetite change, fatigue and unexpected weight change.  HENT: Negative for congestion, dental problem, ear pain, hearing loss, mouth sores, postnasal drip, sinus pressure, sneezing, sore throat and voice change.   Eyes: Negative for pain and visual disturbance.  Respiratory: Positive for cough and shortness of breath. Negative for chest tightness, wheezing and stridor.   Cardiovascular: Negative for chest pain, palpitations and leg swelling.  Gastrointestinal: Negative for nausea, vomiting, abdominal pain, blood in stool, abdominal distention and rectal pain.  Genitourinary: Negative for dysuria, hematuria, decreased urine volume, vaginal bleeding, vaginal discharge, difficulty urinating, vaginal pain and menstrual problem.  Musculoskeletal: Positive for back pain. Negative for joint swelling, gait problem and neck pain.  Skin: Negative for color change, rash and wound.  Neurological: Negative for dizziness, tremors, syncope, speech difficulty and light-headedness.  Hematological: Negative for adenopathy.  Psychiatric/Behavioral: Negative for suicidal ideas, hallucinations, behavioral problems, confusion, sleep disturbance, dysphoric mood and decreased concentration. The patient is not nervous/anxious and is not hyperactive.        Objective:   Physical Exam  Constitutional:  She appears well-developed. No distress.  HENT:  Head: Normocephalic.  Right Ear: External ear normal.  Left Ear: External ear normal.  Nose: Nose normal.  Mouth/Throat: Oropharynx is clear and moist.  Eyes: Conjunctivae are normal. Pupils are equal, round, and reactive to light. Right eye exhibits no discharge. Left eye exhibits no discharge.  Neck: Normal range of motion. Neck supple. No JVD present. No tracheal deviation present. No thyromegaly present.  Cardiovascular: Normal rate, regular rhythm and normal heart sounds.   Pulmonary/Chest: No stridor. No respiratory distress. She has no wheezes.  Abdominal: Soft. Bowel sounds are normal. She exhibits no distension and no mass. There is no tenderness. There is no rebound and no guarding.  Musculoskeletal: She exhibits no edema or tenderness.  Lymphadenopathy:    She has no cervical adenopathy.  Neurological: She displays normal reflexes. No cranial nerve deficit. She exhibits normal muscle tone. Coordination normal.  Skin: No rash noted. No erythema.  Psychiatric: She has a normal mood and affect. Her behavior is normal. Judgment and thought content normal.    Lab Results  Component Value Date   WBC 11.8* 01/27/2014   HGB 12.9 01/27/2014   HCT 38.0 01/27/2014   PLT 313.0 01/27/2014   GLUCOSE 128* 01/27/2014   CHOL 221* 07/06/2013   TRIG 200.0* 07/06/2013   HDL 60.90 07/06/2013   LDLDIRECT 126.5 06/14/2012   LDLCALC 120* 07/06/2013   ALT 23 07/06/2013   AST 32 07/06/2013   NA 138 01/27/2014   K 3.9 01/27/2014   CL 103 01/27/2014   CREATININE 1.0 01/27/2014   BUN 22 01/27/2014   CO2 27 01/27/2014   TSH 1.17 07/06/2013   INR 1.0 09/04/2011   HGBA1C 5.8* 06/04/2012  Chest CT EKG - no new changes     Assessment & Plan:

## 2014-03-29 ENCOUNTER — Ambulatory Visit
Admission: RE | Admit: 2014-03-29 | Discharge: 2014-03-29 | Disposition: A | Discharge status: Home / Self Care | Payer: Medicare Other | Source: Ambulatory Visit | Attending: Obstetrics | Admitting: Obstetrics

## 2014-03-29 DIAGNOSIS — M8580 Other specified disorders of bone density and structure, unspecified site: Secondary | ICD-10-CM

## 2014-04-06 ENCOUNTER — Encounter (HOSPITAL_COMMUNITY): Payer: Self-pay | Admitting: Cardiology

## 2014-04-25 ENCOUNTER — Other Ambulatory Visit: Payer: Self-pay | Admitting: Cardiology

## 2014-04-25 MED ORDER — LOSARTAN POTASSIUM 50 MG PO TABS: ORAL_TABLET | ORAL | Status: DC

## 2014-05-29 DIAGNOSIS — R312 Other microscopic hematuria: Secondary | ICD-10-CM | POA: Diagnosis not present

## 2014-05-29 DIAGNOSIS — C679 Malignant neoplasm of bladder, unspecified: Secondary | ICD-10-CM | POA: Diagnosis not present

## 2014-05-29 DIAGNOSIS — R319 Hematuria, unspecified: Secondary | ICD-10-CM | POA: Diagnosis not present

## 2014-06-07 DIAGNOSIS — M791 Myalgia: Secondary | ICD-10-CM | POA: Diagnosis not present

## 2014-06-07 DIAGNOSIS — M542 Cervicalgia: Secondary | ICD-10-CM | POA: Diagnosis not present

## 2014-06-12 DIAGNOSIS — C679 Malignant neoplasm of bladder, unspecified: Secondary | ICD-10-CM | POA: Diagnosis not present

## 2014-06-12 DIAGNOSIS — N200 Calculus of kidney: Secondary | ICD-10-CM | POA: Diagnosis not present

## 2014-06-12 DIAGNOSIS — R911 Solitary pulmonary nodule: Secondary | ICD-10-CM | POA: Diagnosis not present

## 2014-06-12 DIAGNOSIS — I700 Atherosclerosis of aorta: Secondary | ICD-10-CM | POA: Diagnosis not present

## 2014-06-12 DIAGNOSIS — N329 Bladder disorder, unspecified: Secondary | ICD-10-CM | POA: Diagnosis not present

## 2014-06-13 DIAGNOSIS — R911 Solitary pulmonary nodule: Secondary | ICD-10-CM | POA: Diagnosis not present

## 2014-06-15 ENCOUNTER — Telehealth: Payer: Self-pay | Admitting: Internal Medicine

## 2014-06-15 NOTE — Telephone Encounter (Signed)
Called spoke with pt. She reports she had CT chest done w/ [facility] on Tuesday and showed "lump in lung". She is wanting MW to review these. These can be accessed in PACS. Please advise thanks

## 2014-06-16 ENCOUNTER — Telehealth: Payer: Self-pay | Admitting: Internal Medicine

## 2014-06-16 NOTE — Telephone Encounter (Signed)
Left message with husband: she has had numerous nodules in both lung that we are following serially with the next study due in July which is a good time to follow up on this latest one but nothing needed in meantime especially given the severity of her copd

## 2014-06-16 NOTE — Telephone Encounter (Signed)
Tanda Rockers, MD at 06/16/2014 1:20 PM     Status: Signed       Expand All Collapse All   Left message with husband: she has had numerous nodules in both lung that we are following serially with the next study due in July which is a good time to follow up on this latest one but nothing needed in meantime especially given the severity of her copd       Called and spoke to pt. Pt stated her husband has dementia and wants to know what MW told her husband about her results. Information relayed to pt per MW's note. Recall placed for pt to f/u in 10/2014. Pt verbalized understanding and denied any further questions or concerns at this time.

## 2014-06-19 ENCOUNTER — Other Ambulatory Visit: Payer: Self-pay | Admitting: Urology

## 2014-06-19 ENCOUNTER — Ambulatory Visit: Payer: Self-pay | Admitting: Internal Medicine

## 2014-06-20 ENCOUNTER — Encounter (HOSPITAL_BASED_OUTPATIENT_CLINIC_OR_DEPARTMENT_OTHER): Payer: Self-pay | Admitting: *Deleted

## 2014-06-20 NOTE — Progress Notes (Addendum)
To [facility] on arrival,Ekg,Cxr,echo,office note with chart.Instructed Npo after Mn-will use symbicort,bring albuterol inhaler-take prednisone with sip water in am.  NOTED THAT LVSF % WAS NOT STATED ON LAST ECHO 03-15-2014.  CALL DR Martinique OFFICE BUT HE OR HIS NURSE IS NOT IN OFFICE SO SPOKE W/ OFFICE MANAGER ,MISTY.  SHE STATES SHE WILL GET A CARDIOLOGIST TO READ TEST AND WILL CALL BACK WITH LVSF %.  RECEIVED CALL BACK FROM OFFICE ECHO TECH, MISSY CHURCH, SHE STATES THAT DR K. NELSON READ ECHO AND LVEF IS 45%.

## 2014-06-21 ENCOUNTER — Encounter (HOSPITAL_BASED_OUTPATIENT_CLINIC_OR_DEPARTMENT_OTHER): Payer: Self-pay | Admitting: *Deleted

## 2014-06-21 NOTE — Progress Notes (Signed)
NOTED PT LAST ECHO DONE ON 03-15-2014 DID NOT STATE LVSF%.  CALLED DR Martinique OFFICE , HE OR HIS NURSE IS NOT IN TODAY. SPOKE W/ OFFICE MANAGE, MISTY.  MISTY HAD THE ECHO TECH, MISSY CHURCH, TO GET DR K. NELSON READ ECHO, AND STATED LVSF 45%.

## 2014-06-22 ENCOUNTER — Ambulatory Visit: Payer: Medicare Other | Admitting: Internal Medicine

## 2014-06-23 ENCOUNTER — Encounter (HOSPITAL_BASED_OUTPATIENT_CLINIC_OR_DEPARTMENT_OTHER)
Admission: RE | Disposition: A | Discharge status: Home / Self Care | Payer: Self-pay | Source: Ambulatory Visit | Attending: Urology

## 2014-06-23 ENCOUNTER — Encounter (HOSPITAL_BASED_OUTPATIENT_CLINIC_OR_DEPARTMENT_OTHER): Payer: Self-pay | Admitting: *Deleted

## 2014-06-23 ENCOUNTER — Ambulatory Visit (HOSPITAL_BASED_OUTPATIENT_CLINIC_OR_DEPARTMENT_OTHER): Payer: Medicare Other | Admitting: Anesthesiology

## 2014-06-23 ENCOUNTER — Ambulatory Visit (HOSPITAL_COMMUNITY): Payer: Medicare Other

## 2014-06-23 ENCOUNTER — Ambulatory Visit (HOSPITAL_BASED_OUTPATIENT_CLINIC_OR_DEPARTMENT_OTHER)
Admission: RE | Admit: 2014-06-23 | Discharge: 2014-06-23 | Disposition: A | Discharge status: Home / Self Care | Payer: Medicare Other | Source: Ambulatory Visit | Attending: Urology | Admitting: Urology

## 2014-06-23 DIAGNOSIS — I5022 Chronic systolic (congestive) heart failure: Secondary | ICD-10-CM | POA: Insufficient documentation

## 2014-06-23 DIAGNOSIS — J449 Chronic obstructive pulmonary disease, unspecified: Secondary | ICD-10-CM | POA: Diagnosis not present

## 2014-06-23 DIAGNOSIS — M8580 Other specified disorders of bone density and structure, unspecified site: Secondary | ICD-10-CM | POA: Insufficient documentation

## 2014-06-23 DIAGNOSIS — Z791 Long term (current) use of non-steroidal anti-inflammatories (NSAID): Secondary | ICD-10-CM | POA: Diagnosis not present

## 2014-06-23 DIAGNOSIS — D494 Neoplasm of unspecified behavior of bladder: Secondary | ICD-10-CM | POA: Diagnosis not present

## 2014-06-23 DIAGNOSIS — Z881 Allergy status to other antibiotic agents status: Secondary | ICD-10-CM | POA: Diagnosis not present

## 2014-06-23 DIAGNOSIS — R0609 Other forms of dyspnea: Secondary | ICD-10-CM

## 2014-06-23 DIAGNOSIS — J439 Emphysema, unspecified: Secondary | ICD-10-CM | POA: Insufficient documentation

## 2014-06-23 DIAGNOSIS — Z880 Allergy status to penicillin: Secondary | ICD-10-CM | POA: Diagnosis not present

## 2014-06-23 DIAGNOSIS — I447 Left bundle-branch block, unspecified: Secondary | ICD-10-CM | POA: Diagnosis not present

## 2014-06-23 DIAGNOSIS — C671 Malignant neoplasm of dome of bladder: Secondary | ICD-10-CM | POA: Insufficient documentation

## 2014-06-23 DIAGNOSIS — I2510 Atherosclerotic heart disease of native coronary artery without angina pectoris: Secondary | ICD-10-CM | POA: Insufficient documentation

## 2014-06-23 DIAGNOSIS — Z87891 Personal history of nicotine dependence: Secondary | ICD-10-CM | POA: Diagnosis not present

## 2014-06-23 DIAGNOSIS — Z7983 Long term (current) use of bisphosphonates: Secondary | ICD-10-CM | POA: Diagnosis not present

## 2014-06-23 DIAGNOSIS — R911 Solitary pulmonary nodule: Secondary | ICD-10-CM | POA: Diagnosis not present

## 2014-06-23 DIAGNOSIS — Z7951 Long term (current) use of inhaled steroids: Secondary | ICD-10-CM | POA: Diagnosis not present

## 2014-06-23 DIAGNOSIS — C678 Malignant neoplasm of overlapping sites of bladder: Secondary | ICD-10-CM | POA: Diagnosis not present

## 2014-06-23 DIAGNOSIS — Z888 Allergy status to other drugs, medicaments and biological substances status: Secondary | ICD-10-CM | POA: Insufficient documentation

## 2014-06-23 DIAGNOSIS — C679 Malignant neoplasm of bladder, unspecified: Secondary | ICD-10-CM | POA: Diagnosis not present

## 2014-06-23 DIAGNOSIS — I429 Cardiomyopathy, unspecified: Secondary | ICD-10-CM | POA: Insufficient documentation

## 2014-06-23 DIAGNOSIS — Z79899 Other long term (current) drug therapy: Secondary | ICD-10-CM | POA: Insufficient documentation

## 2014-06-23 HISTORY — PX: FULGURATION OF BLADDER TUMOR: SHX6261

## 2014-06-23 HISTORY — PX: CYSTOSCOPY: SHX5120

## 2014-06-23 LAB — POCT I-STAT, CHEM 8
BUN: 25 mg/dL — ABNORMAL HIGH (ref 6–23)
CREATININE: 0.8 mg/dL (ref 0.50–1.10)
Calcium, Ion: 1.27 mmol/L (ref 1.13–1.30)
Chloride: 101 mmol/L (ref 96–112)
Glucose, Bld: 100 mg/dL — ABNORMAL HIGH (ref 70–99)
HCT: 45 % (ref 36.0–46.0)
Hemoglobin: 15.3 g/dL — ABNORMAL HIGH (ref 12.0–15.0)
Potassium: 4 mmol/L (ref 3.5–5.1)
Sodium: 140 mmol/L (ref 135–145)
TCO2: 25 mmol/L (ref 0–100)

## 2014-06-23 SURGERY — CYSTOSCOPY
Anesthesia: General | Site: Bladder

## 2014-06-23 MED ORDER — GENTAMICIN SULFATE 40 MG/ML IJ SOLN
5.0000 mg/kg | INTRAVENOUS | Status: AC
  Administered 2014-06-23: 250 mg via INTRAVENOUS
  Filled 2014-06-23: qty 6.25

## 2014-06-23 MED ORDER — FENTANYL CITRATE 0.05 MG/ML IJ SOLN
INTRAMUSCULAR | Status: AC
  Filled 2014-06-23: qty 2

## 2014-06-23 MED ORDER — PHENYLEPHRINE HCL 10 MG/ML IJ SOLN
INTRAMUSCULAR | Status: DC | PRN
  Administered 2014-06-23 (×3): 120 ug via INTRAVENOUS

## 2014-06-23 MED ORDER — LACTATED RINGERS IV SOLN
INTRAVENOUS | Status: DC
  Filled 2014-06-23: qty 1000

## 2014-06-23 MED ORDER — FENTANYL CITRATE 0.05 MG/ML IJ SOLN
25.0000 ug | INTRAMUSCULAR | Status: DC | PRN
  Filled 2014-06-23: qty 1

## 2014-06-23 MED ORDER — ONDANSETRON HCL 4 MG/2ML IJ SOLN
INTRAMUSCULAR | Status: DC | PRN
  Administered 2014-06-23: 4 mg via INTRAVENOUS

## 2014-06-23 MED ORDER — LIDOCAINE HCL (CARDIAC) 20 MG/ML IV SOLN
INTRAVENOUS | Status: DC | PRN
  Administered 2014-06-23: 50 mg via INTRAVENOUS

## 2014-06-23 MED ORDER — DEXAMETHASONE SODIUM PHOSPHATE 4 MG/ML IJ SOLN
INTRAMUSCULAR | Status: DC | PRN
  Administered 2014-06-23: 10 mg via INTRAVENOUS

## 2014-06-23 MED ORDER — EPHEDRINE SULFATE 50 MG/ML IJ SOLN
INTRAMUSCULAR | Status: DC | PRN
  Administered 2014-06-23 (×2): 10 mg via INTRAVENOUS

## 2014-06-23 MED ORDER — STERILE WATER FOR IRRIGATION IR SOLN
Status: DC | PRN
  Administered 2014-06-23: 3000 mL

## 2014-06-23 MED ORDER — PROPOFOL 10 MG/ML IV BOLUS
INTRAVENOUS | Status: DC | PRN
  Administered 2014-06-23: 30 mg via INTRAVENOUS
  Administered 2014-06-23: 20 mg via INTRAVENOUS
  Administered 2014-06-23: 110 mg via INTRAVENOUS

## 2014-06-23 MED ORDER — FENTANYL CITRATE 0.05 MG/ML IJ SOLN
INTRAMUSCULAR | Status: DC | PRN
  Administered 2014-06-23 (×2): 25 ug via INTRAVENOUS
  Administered 2014-06-23: 50 ug via INTRAVENOUS

## 2014-06-23 MED ORDER — LACTATED RINGERS IV SOLN
INTRAVENOUS | Status: DC
  Administered 2014-06-23 (×2): via INTRAVENOUS
  Filled 2014-06-23: qty 1000

## 2014-06-23 SURGICAL SUPPLY — 39 items
BAG DRAIN URO-CYSTO SKYTR STRL (DRAIN) ×5 IMPLANT
BAG DRN ANRFLXCHMBR STRAP LEK (BAG)
BAG DRN UROCATH (DRAIN) ×3
BAG URINE DRAINAGE (UROLOGICAL SUPPLIES) IMPLANT
BAG URINE LEG 19OZ MD ST LTX (BAG) IMPLANT
CANISTER SUCT LVC 12 LTR MEDI- (MISCELLANEOUS) ×3 IMPLANT
CATH FOLEY 2WAY SLVR  5CC 20FR (CATHETERS)
CATH FOLEY 2WAY SLVR  5CC 22FR (CATHETERS)
CATH FOLEY 2WAY SLVR 5CC 20FR (CATHETERS) IMPLANT
CATH FOLEY 2WAY SLVR 5CC 22FR (CATHETERS) IMPLANT
CLOTH BEACON ORANGE TIMEOUT ST (SAFETY) ×5 IMPLANT
ELECT REM PT RETURN 9FT ADLT (ELECTROSURGICAL) ×5
ELECTRODE REM PT RTRN 9FT ADLT (ELECTROSURGICAL) ×3 IMPLANT
EVACUATOR MICROVAS BLADDER (UROLOGICAL SUPPLIES) ×5 IMPLANT
GLOVE BIO SURGEON STRL SZ 6.5 (GLOVE) ×4 IMPLANT
GLOVE BIO SURGEON STRL SZ7 (GLOVE) ×5 IMPLANT
GLOVE BIO SURGEONS STRL SZ 6.5 (GLOVE) ×2
GLOVE BIOGEL PI IND STRL 6.5 (GLOVE) ×3 IMPLANT
GLOVE BIOGEL PI IND STRL 7.0 (GLOVE) ×2 IMPLANT
GLOVE BIOGEL PI IND STRL 7.5 (GLOVE) ×1 IMPLANT
GLOVE BIOGEL PI INDICATOR 6.5 (GLOVE) ×6
GLOVE BIOGEL PI INDICATOR 7.0 (GLOVE) ×4
GLOVE BIOGEL PI INDICATOR 7.5 (GLOVE) ×2
GLOVE SKINSENSE NS SZ7.0 (GLOVE) ×4
GLOVE SKINSENSE STRL SZ7.0 (GLOVE) ×2 IMPLANT
GOWN STRL REUS W/TWL LRG LVL3 (GOWN DISPOSABLE) ×6 IMPLANT
GOWN STRL REUS W/TWL XL LVL3 (GOWN DISPOSABLE) ×6 IMPLANT
HOLDER FOLEY CATH W/STRAP (MISCELLANEOUS) IMPLANT
LOOP CUTTING 24FR OLYMPUS (CUTTING LOOP) IMPLANT
NDL SAFETY ECLIPSE 18X1.5 (NEEDLE) IMPLANT
NEEDLE HYPO 18GX1.5 SHARP (NEEDLE)
NEEDLE HYPO 22GX1.5 SAFETY (NEEDLE) IMPLANT
NS IRRIG 500ML POUR BTL (IV SOLUTION) IMPLANT
PACK CYSTO (CUSTOM PROCEDURE TRAY) ×5 IMPLANT
PLUG CATH AND CAP STER (CATHETERS) IMPLANT
SET ASPIRATION TUBING (TUBING) IMPLANT
SYR 20CC LL (SYRINGE) IMPLANT
SYRINGE IRR TOOMEY STRL 70CC (SYRINGE) IMPLANT
WATER STERILE IRR 3000ML UROMA (IV SOLUTION) ×5 IMPLANT

## 2014-06-23 NOTE — Anesthesia Postprocedure Evaluation (Signed)
  Anesthesia Post-op Note  Patient: Amber Snow  Procedure(s) Performed: Procedure(s) (LRB): CYSTOSCOPY (N/A) FULGURATION OF BLADDER TUMOR  Patient Location: PACU  Anesthesia Type: General  Level of Consciousness: awake and alert   Airway and Oxygen Therapy: Patient Spontanous Breathing  Post-op Pain: mild  Post-op Assessment: Post-op Vital signs reviewed, Patient's Cardiovascular Status Stable, Respiratory Function Stable, Patent Airway and No signs of Nausea or vomiting  Last Vitals:  Filed Vitals:   06/23/14 1403  BP:   Pulse: 72  Temp:   Resp: 14    Post-op Vital Signs: stable   Complications: No apparent anesthesia complications

## 2014-06-23 NOTE — Discharge Instructions (Signed)
°  Post Anesthesia Home Care Instructions  Activity: Get plenty of rest for the remainder of the day. A responsible adult should stay with you for 24 hours following the procedure.  For the next 24 hours, DO NOT: -Drive a car -Paediatric nurse -Drink alcoholic beverages -Take any medication unless instructed by your physician -Make any legal decisions or sign important papers.  Meals: Start with liquid foods such as gelatin or soup. Progress to regular foods as tolerated. Avoid greasy, spicy, heavy foods. If nausea and/or vomiting occur, drink only clear liquids until the nausea and/or vomiting subsides. Call your physician if vomiting continues.  Special Instructions/Symptoms: Your throat may feel dry or sore from the anesthesia or the breathing tube placed in your throat during surgery. If this causes discomfort, gargle with warm salt water. The discomfort should disappear within 24 hours. Call your surgeon if you experience:   1.  Fever over 101.0. 2.  Inability to urinate. 3.  Nausea and/or vomiting. 4.  Extreme swelling or bruising at the surgical site. 5.  Continued bleeding from the incision. 6.  Increased pain, redness or drainage from the incision. 7.  Problems related to your pain medication. 9. Any problems and/or concerns  CYSTOSCOPY HOME CARE INSTRUCTIONS  Activity: Rest for the remainder of the day.  Do not drive or operate equipment today.  You may resume normal activities in one to two days as instructed by your physician.   Meals: Drink plenty of liquids and eat light foods such as gelatin or soup this evening.  You may return to a normal meal plan tomorrow.  Return to Work: You may return to work in one to two days or as instructed by your physician.  Special Instructions / Symptoms: Call your physician if any of these symptoms occur:   -persistent or heavy bleeding  -bleeding which continues after first few urination  -large blood clots that are difficult  to pass  -urine stream diminishes or stops completely  -fever equal to or higher than 101 degrees Farenheit.  -cloudy urine with a strong, foul odor  -severe pain  Females should always wipe from front to back after elimination.  You may feel some burning pain when you urinate.  This should disappear with time.  Applying moist heat to the lower abdomen or a hot tub bath may help relieve the pain. \  Follow-Up / Date of Return Visit to Your Physician:  Call for an appointment to arrange follow-up.  Patient Signature:  ________________________________________________________  Nurse's Signature:  ________________________________________________________

## 2014-06-23 NOTE — Anesthesia Preprocedure Evaluation (Addendum)
Anesthesia Evaluation  Patient identified by MRN, date of birth, ID band Patient awake  General Assessment Comment: Diverticulosis of colon  . Hyperlipidemia  . History of nephrolithiasis  . Osteopenia  . Arthritis HANDS . Chronic systolic CHF (congestive heart failure)  . Renal calculus, bilateral  . Bladder cancer  . Bladder calculus  . COPD, moderate PULMOLOGIST-- DR Melvyn Novas   W/ EMPHYSEMA (COPD GOLD II) . Wears hearing aid    BILATERAL . Nonischemic cardiomyopathy  . LBBB (left bundle branch block)    CHRONIC . PAC (premature atrial contraction)  . CAD (coronary artery disease) CARDIOLOGSIT-- DR Martinique   05-17-2013minimal nonobstructive CAD per cath with EF of 35 to 40% and normal filling pressures and right heart pressures . Dyspnea on exertion  . Nodule of right lung    APICAL 1.0CM (MONITORED BY DR Melvyn Novas) . Myalgia        Reviewed: Allergy & Precautions, NPO status , Patient's Chart, lab work & pertinent test results  Airway Mallampati: II  TM Distance: >3 FB Neck ROM: Full    Dental no notable dental hx. (+) Dental Advisory Given, Teeth Intact,    Pulmonary shortness of breath, pneumonia -, resolved, COPDformer smoker,  01-27-15 Chest x-ray IMPRESSION: 1. Stable appearance of prominent emphysema. 2. Atherosclerosis. breath sounds clear to auscultation  Pulmonary exam normal       Cardiovascular + CAD, + Peripheral Vascular Disease and +CHF + dysrhythmias + Valvular Problems/Murmurs Rhythm:Regular Rate:Normal  LV dysfunction on ECHO and myoview.  H/O LBBB  Last cardiology office visit reviewed, Dr. Martinique, 03-10-14   Neuro/Psych  Neuromuscular disease negative psych ROS   GI/Hepatic negative GI ROS, Neg liver ROS,   Endo/Other  negative endocrine ROS  Renal/GU Renal disease  negative genitourinary   Musculoskeletal  (+) Arthritis -,    Abdominal   Peds negative pediatric ROS (+)  Hematology negative hematology ROS (+)   Anesthesia Other Findings   Reproductive/Obstetrics negative OB ROS                         Anesthesia Physical Anesthesia Plan  ASA: III  Anesthesia Plan: General   Post-op Pain Management:    Induction: Intravenous  Airway Management Planned: LMA  Additional Equipment:   Intra-op Plan:   Post-operative Plan: Extubation in OR  Informed Consent: I have reviewed the patients History and Physical, chart, labs and discussed the procedure including the risks, benefits and alternatives for the proposed anesthesia with the patient or authorized representative who has indicated his/her understanding and acceptance.   Dental advisory given  Plan Discussed with: CRNA  Anesthesia Plan Comments:         Anesthesia Quick Evaluation

## 2014-06-23 NOTE — H&P (Signed)
History of Present Illness Amber Snow had bladder stone removal and right distal ureteral stone extraction on 04/01/13. She has been having left flank pain radiating to the left lower quadrant on and off. She had severe pain last Saturday. She has a history of bladder tumor and had fulguration bladder tumor on 08/20/12. She completed maintenance BCG on 11/03/13. Cystoscopy today shows 2 superficial recurrent tumors at the dome of the bladder, measuring in total 2 cm. She has not had gross hematuria. Urinalysis shows 0-2 WBCs, 0-2 RBCs, trace leukocyte esterase. CT scan scan chest showed an 8 mm lung nodule.  She was seen by Dr Christinia Gully who is going to follow her with repeat CT in 6 months.  She is scheduled for TUR or fulguration of small bladder tumors.  Past Medical History Problems  1. History of Calculus of ureter (N20.1) 2. History of kidney stones (Y78.295)  Surgical History Problems  1. History of Cataract Surgery 2. History of Cystoscopy With Fragmentation Of Bladder Calculus Over 2.5cm 3. History of Cystoscopy With Fulguration Medium Lesion (2-5cm) 4. History of Cystoscopy With Fulguration Minor Lesion (Under 63m) 5. History of Hand Surgery 6. History of Lithotripsy 7. History of Tonsillectomy  Current Meds 1. Albuterol Sulfate TABS;  Therapy: (Recorded:25Mar2015) to Recorded 2. Bisoprolol Fumarate TABS;  Therapy: (Recorded:25Mar2015) to Recorded 3. Ibuprofen 800 MG Oral Tablet;  Therapy: [id number] Recorded 4. Losartan Potassium 50 MG Oral Tablet;  Therapy: (Recorded:01Dec2014) to Recorded 5. Pravastatin Sodium 40 MG Oral Tablet;  Therapy: 09Oct2012 to Recorded 6. ProAir HFA 108 (90 Base) MCG/ACT Inhalation Aerosol Solution;  Therapy: 22Aug2012 to Recorded 7. Reclast 5 MG/100ML Intravenous Solution;  Therapy: (Recorded:25Mar2015) to Recorded 8. Spironolactone 25 MG Oral Tablet;  Therapy: (Recorded:01Dec2014) to Recorded 9. Symbicort 160-4.5 MCG/ACT Inhalation  Aerosol;  Therapy: [id number] Recorded 10. TraZODone HCl - 50 MG Oral Tablet;   Therapy: 21Jan2013 to Recorded 11. Vitamin D TABS;   Therapy: (Recorded:25Mar2015) to Recorded  Allergies Medication  1. Cipro TABS 2. Boniva KIT 3. Penicillins  Family History Problems  1. Family history of Family Health Status Number Of Children   1 son 2. Family history of Father Deceased At Age ____ 3. Family history of Heart Disease : Father 4. Family history of Hypertension : Mother 552 Family history of Mother Deceased At Age ____ 680 Family history of Nephrolithiasis  Social History Problems  1. Denied: History of Alcohol Use 2. Caffeine Use   3 to 4 cups 3. Former smoker [phone] 4. Marital History - Currently Married 5. Tobacco use (Z72.0)  Review of Systems Genitourinary, constitutional, skin, eye, otolaryngeal, hematologic/lymphatic, cardiovascular, pulmonary, endocrine, musculoskeletal, gastrointestinal, neurological and psychiatric system(s) were reviewed and pertinent findings if present are noted and are otherwise negative.    Vitals Vital Signs [Data Includes: Last 1 Day]  Recorded: [id number]:15AM  Blood Pressure: 168 / 80 Heart Rate: 73 Respiration: 18  Physical Exam Constitutional: Well nourished and well developed . No acute distress.  Abdomen: The abdomen is soft and nontender. No masses are palpated. No CVA tenderness. No hernias are palpable. No hepatosplenomegaly noted.  Genitourinary:  Chaperone Present: .  Examination of the external genitalia shows normal female external genitalia and no lesions. The urethra is normal in appearance and not tender. There is no urethral mass. Vaginal exam demonstrates no abnormalities. The adnexa are palpably normal. The bladder is non tender and not distended. The anus is normal on inspection. The perineum is normal on inspection.  Lymphatics: The femoral  and inguinal nodes are not enlarged or tender.  Skin: Normal skin  turgor, no visible rash and no visible skin lesions.  Neuro/Psych:. Mood and affect are appropriate.    Results/Data Urine [Data Includes: Last 1 Day]   [id number]  COLOR YELLOW   APPEARANCE CLEAR   SPECIFIC GRAVITY 1.020   pH 6.0   GLUCOSE NEG mg/dL  BILIRUBIN NEG   KETONE NEG mg/dL  BLOOD NEG   PROTEIN NEG mg/dL  UROBILINOGEN 0.2 mg/dL  NITRITE NEG   LEUKOCYTE ESTERASE TRACE   SQUAMOUS EPITHELIAL/HPF FEW   WBC 0-2 WBC/hpf  RBC 0-2 RBC/hpf  BACTERIA NONE SEEN   CRYSTALS NONE SEEN   CASTS NONE SEEN    Procedure  Procedure: Cystoscopy  Chaperone Present: Marin Roberts.  Indication: History of Urothelial Carcinoma.  Informed Consent: Risks, benefits, and potential adverse events were discussed and informed consent was obtained from the patient . Specific risks including, but not limited to bleeding, infection, pain, allergic reaction etc. were explained.  Prep: The patient was prepped with betadine.  Anesthesia:. Local anesthesia was administered intraurethrally with 2% lidocaine jelly.  Antibiotic prophylaxis: Ciprofloxacin.  Procedure Note:  Urethral meatus:. No abnormalities.  Anterior urethra: No abnormalities.  Bladder: Visulization was clear. The ureteral orifices were in the normal anatomic position bilaterally. Multiple tumors were identified in the bladder. A papillary tumor was seen in the bladder measuring approximately 1 cm in size. Another tumor was seen in the bladder measuring approximately 1 cm in size. There is also erythema at the dome of the bladder.    Assessment Assessed  1. Nephrolithiasis (N20.0) 2. Malignant neoplasm of bladder (C67.9)  Plan Health Maintenance  1. UA With REFLEX; [Do Not Release]; Status:Complete;   Done: [id number] 09:45AM Hematuria  2. BUN & CREATININE; Status:In Progress - Specimen/Data Collected;   Done: [id number] 3. VENIPUNCTURE; Status:Complete;   Done: [id number]   She will need cystoscopy, bladder biopsy under general  anesthesia.

## 2014-06-23 NOTE — Transfer of Care (Signed)
Immediate Anesthesia Transfer of Care Note  Patient: Amber Snow  Procedure(s) Performed: Procedure(s): CYSTOSCOPY (N/A) FULGURATION OF BLADDER TUMOR  Patient Location: PACU  Anesthesia Type:General  Level of Consciousness: awake, alert , oriented and patient cooperative  Airway & Oxygen Therapy: Patient Spontanous Breathing and Patient connected to nasal cannula oxygen  Post-op Assessment: Report given to RN and Post -op Vital signs reviewed and stable  Post vital signs: Reviewed and stable  Last Vitals:  Filed Vitals:   06/23/14 0851  BP: 135/73  Pulse: 73  Temp: 36.4 C  Resp: 14    Complications: No apparent anesthesia complications

## 2014-06-23 NOTE — Anesthesia Procedure Notes (Signed)
Procedure Name: LMA Insertion Date/Time: 06/23/2014 12:18 PM Performed by: [name] Pre-anesthesia Checklist: Patient identified, Timeout performed, Emergency Drugs available, Suction available and Patient being monitored Patient Re-evaluated:Patient Re-evaluated prior to inductionOxygen Delivery Method: Circle system utilized Preoxygenation: Pre-oxygenation with 100% oxygen Intubation Type: IV induction Ventilation: Mask ventilation without difficulty LMA: LMA inserted LMA Size: 4.0 Number of attempts: 1 Placement Confirmation: positive ETCO2 Tube secured with: Tape Dental Injury: Teeth and Oropharynx as per pre-operative assessment

## 2014-06-23 NOTE — Op Note (Signed)
Amber Snow is a 81 y.o.   06/23/2014  General   Pre-op diagnosis: Recurrent superficial bladder tumor  Postop diagnosis: Same  Procedure done: Cystoscopy fulguration of superficial bladder tumors  Surgeon: Charlene Brooke. Amber Snow  Anesthesia: Gen.  Indication: Patient is a 81 years old female who has a history of the bladder tumor. She had a TUR bladder tumors in the past. She had fulguration of bladder tumor on 08/20/2012. She completed maintenance BCG 1 11/03/2013. Cystoscopy revealed 2 superficial recurrent tumors at the dome of the bladder measuring in total 2 cm. She is scheduled today for cystoscopy TUR bladder tumor or fulguration of bladder tumors.  Procedure: Patient was identified by her wrist band and proper timeout was taken.  Under general anesthesia she was prepped and draped and placed in the dorsolithotomy position. A panendoscope was inserted in the bladder. There are 2 superficial tumors at the dome of the bladder. They measure in total 2 cm. I did not attempt to resect those tumors because they are superficial. With the Bugbee electrode the tumors were fulgurated. Several reddened areas of the bladder were also fulgurated. There was no evidence of bleeding at the end of the procedure. The bladder was then emptied and cystoscope removed.  She tolerated the procedure well and left the OR in satisfactory condition to postanesthesia care unit  EBL: Minimal

## 2014-06-26 ENCOUNTER — Encounter (HOSPITAL_BASED_OUTPATIENT_CLINIC_OR_DEPARTMENT_OTHER): Payer: Self-pay | Admitting: Urology

## 2014-06-29 ENCOUNTER — Ambulatory Visit: Payer: Self-pay | Admitting: Internal Medicine

## 2014-06-30 ENCOUNTER — Ambulatory Visit (INDEPENDENT_AMBULATORY_CARE_PROVIDER_SITE_OTHER): Payer: Medicare Other | Admitting: Internal Medicine

## 2014-06-30 ENCOUNTER — Encounter: Payer: Self-pay | Admitting: Internal Medicine

## 2014-06-30 VITALS — BP 130/80 | HR 101 | Temp 97.6°F

## 2014-06-30 DIAGNOSIS — C679 Malignant neoplasm of bladder, unspecified: Secondary | ICD-10-CM | POA: Diagnosis not present

## 2014-06-30 DIAGNOSIS — N390 Urinary tract infection, site not specified: Secondary | ICD-10-CM | POA: Diagnosis not present

## 2014-06-30 DIAGNOSIS — C673 Malignant neoplasm of anterior wall of bladder: Secondary | ICD-10-CM

## 2014-06-30 MED ORDER — ALPRAZOLAM 0.25 MG PO TABS: 0.1250 mg | ORAL_TABLET | Freq: Two times a day (BID) | ORAL | Status: DC | PRN

## 2014-06-30 MED ORDER — BUDESONIDE-FORMOTEROL FUMARATE 160-4.5 MCG/ACT IN AERO: 2.0000 | INHALATION_SPRAY | Freq: Two times a day (BID) | RESPIRATORY_TRACT | Status: DC

## 2014-06-30 NOTE — Progress Notes (Signed)
Pre visit review using our clinic review tool, if applicable. No additional management support is needed unless otherwise documented below in the visit note. 

## 2014-06-30 NOTE — Patient Instructions (Signed)
Start walking 

## 2014-06-30 NOTE — Assessment & Plan Note (Signed)
Cystoscopy is pending

## 2014-06-30 NOTE — Progress Notes (Signed)
Subjective:    HPI  C/o SOB - not better Treated for a UTI by Dr Janice Norrie recently F/u cramps - resolved off Pravachol and a water pill. F/u chronic SOB - it got better off Bisoprolol. Previous problem w/ SOB and tired after small tasks (ie making bed) - is better F/u COPD, CHF, OA  BP Readings from Last 3 Encounters:  06/30/14 130/80  06/23/14 117/68  03/21/14 122/60   Wt Readings from Last 3 Encounters:  06/23/14 112 lb (50.803 kg)  03/21/14 108 lb (48.988 kg)  03/10/14 106 lb 12.8 oz (48.444 kg)    BP 130/80 mmHg  Pulse 101  Temp(Src) 97.6 F (36.4 C) (Oral)  SpO2 95%    Review of Systems  Constitutional: Negative for fever, chills, diaphoresis, activity change, appetite change, fatigue and unexpected weight change.  HENT: Negative for congestion, dental problem, ear pain, hearing loss, mouth sores, postnasal drip, sinus pressure, sneezing, sore throat and voice change.   Eyes: Negative for pain and visual disturbance.  Respiratory: Positive for cough and shortness of breath. Negative for chest tightness, wheezing and stridor.   Cardiovascular: Negative for chest pain, palpitations and leg swelling.  Gastrointestinal: Negative for nausea, vomiting, abdominal pain, blood in stool, abdominal distention and rectal pain.  Genitourinary: Negative for dysuria, hematuria, decreased urine volume, vaginal bleeding, vaginal discharge, difficulty urinating, vaginal pain and menstrual problem.  Musculoskeletal: Positive for back pain. Negative for joint swelling, gait problem and neck pain.  Skin: Negative for color change, rash and wound.  Neurological: Negative for dizziness, tremors, syncope, speech difficulty and light-headedness.  Hematological: Negative for adenopathy.  Psychiatric/Behavioral: Negative for suicidal ideas, hallucinations, behavioral problems, confusion, sleep disturbance, dysphoric mood and decreased concentration. The patient is not nervous/anxious and is not  hyperactive.        Objective:   Physical Exam  Constitutional: She appears well-developed. No distress.  HENT:  Head: Normocephalic.  Right Ear: External ear normal.  Left Ear: External ear normal.  Nose: Nose normal.  Mouth/Throat: Oropharynx is clear and moist.  Eyes: Conjunctivae are normal. Pupils are equal, round, and reactive to light. Right eye exhibits no discharge. Left eye exhibits no discharge.  Neck: Normal range of motion. Neck supple. No JVD present. No tracheal deviation present. No thyromegaly present.  Cardiovascular: Normal rate, regular rhythm and normal heart sounds.   Pulmonary/Chest: No stridor. No respiratory distress. She has no wheezes.  Abdominal: Soft. Bowel sounds are normal. She exhibits no distension and no mass. There is no tenderness. There is no rebound and no guarding.  Musculoskeletal: She exhibits no edema or tenderness.  Lymphadenopathy:    She has no cervical adenopathy.  Neurological: She displays normal reflexes. No cranial nerve deficit. She exhibits normal muscle tone. Coordination normal.  Skin: No rash noted. No erythema.  Psychiatric: She has a normal mood and affect. Her behavior is normal. Judgment and thought content normal.    Lab Results  Component Value Date   WBC 11.8* 01/27/2014   HGB 15.3* 06/23/2014   HCT 45.0 06/23/2014   PLT 313.0 01/27/2014   GLUCOSE 100* 06/23/2014   CHOL 221* 07/06/2013   TRIG 200.0* 07/06/2013   HDL 60.90 07/06/2013   LDLDIRECT 126.5 06/14/2012   LDLCALC 120* 07/06/2013   ALT 23 07/06/2013   AST 32 07/06/2013   NA 140 06/23/2014   K 4.0 06/23/2014   CL 101 06/23/2014   CREATININE 0.80 06/23/2014   BUN 25* 06/23/2014   CO2 27 01/27/2014  TSH 1.17 07/06/2013   INR 1.0 09/04/2011   HGBA1C 5.8* 06/04/2012   Chest CT EKG - no new changes     Assessment & Plan:

## 2014-07-17 ENCOUNTER — Ambulatory Visit (INDEPENDENT_AMBULATORY_CARE_PROVIDER_SITE_OTHER): Payer: Medicare Other | Admitting: Internal Medicine

## 2014-07-17 ENCOUNTER — Encounter: Payer: Self-pay | Admitting: Internal Medicine

## 2014-07-17 VITALS — BP 150/70 | HR 128 | Temp 98.4°F | Wt 112.0 lb

## 2014-07-17 DIAGNOSIS — J441 Chronic obstructive pulmonary disease with (acute) exacerbation: Secondary | ICD-10-CM | POA: Diagnosis not present

## 2014-07-17 DIAGNOSIS — J209 Acute bronchitis, unspecified: Secondary | ICD-10-CM

## 2014-07-17 DIAGNOSIS — R0600 Dyspnea, unspecified: Secondary | ICD-10-CM

## 2014-07-17 MED ORDER — AZITHROMYCIN 250 MG PO TABS: ORAL_TABLET | ORAL | Status: DC

## 2014-07-17 MED ORDER — METHYLPREDNISOLONE ACETATE 80 MG/ML IJ SUSP
80.0000 mg | Freq: Once | INTRAMUSCULAR | Status: AC
  Administered 2014-07-17: 80 mg via INTRAMUSCULAR

## 2014-07-17 NOTE — Assessment & Plan Note (Signed)
Worse due to a URI See Rx O2 sat OK

## 2014-07-17 NOTE — Patient Instructions (Signed)
Use over-the-counter  "cold" medicines  such as "Tylenol cold" , "Advil cold",  "Mucinex" or" Mucinex D"  for cough and congestion.   Avoid decongestants if you have high blood pressure and use "Afrin" nasal spray for nasal congestion as directed instead. Use" Delsym" or" Robitussin" cough syrup varietis for cough.  You can use plain "Tylenol" or "Advil" for fever, chills and achyness. Use Halls or Ricola cough drops.  Please, make an appointment if you are not better or if you're worse.  

## 2014-07-17 NOTE — Assessment & Plan Note (Signed)
   Zpac To ER if worse

## 2014-07-17 NOTE — Progress Notes (Signed)
Subjective:    Cough This is a chronic problem. The problem has been gradually worsening. The cough is productive of purulent sputum. Associated symptoms include shortness of breath. Pertinent negatives include no chest pain, chills, ear pain, fever, postnasal drip, rash, sore throat or wheezing. Her past medical history is significant for COPD.  Shortness of Breath Pertinent negatives include no abdominal pain, chest pain, ear pain, fever, leg swelling, neck pain, rash, sore throat, vomiting or wheezing. Her past medical history is significant for COPD.    C/o SOB - worse Treated for a UTI by Dr Janice Norrie recently F/u cramps - resolved off Pravachol and a water pill. F/u chronic SOB - it got better off Bisoprolol. Previous problem w/ SOB and tired after small tasks (ie making bed) - is better F/u COPD, CHF, OA  BP Readings from Last 3 Encounters:  07/17/14 150/70  06/30/14 130/80  06/23/14 117/68   Wt Readings from Last 3 Encounters:  07/17/14 112 lb (50.803 kg)  06/23/14 112 lb (50.803 kg)  03/21/14 108 lb (48.988 kg)    BP 150/70 mmHg  Pulse 128  Temp(Src) 98.4 F (36.9 C) (Oral)  Wt 112 lb (50.803 kg)  SpO2 90%    Review of Systems  Constitutional: Negative for fever, chills, diaphoresis, activity change, appetite change, fatigue and unexpected weight change.  HENT: Negative for congestion, dental problem, ear pain, hearing loss, mouth sores, postnasal drip, sinus pressure, sneezing, sore throat and voice change.   Eyes: Negative for pain and visual disturbance.  Respiratory: Positive for cough and shortness of breath. Negative for chest tightness, wheezing and stridor.   Cardiovascular: Negative for chest pain, palpitations and leg swelling.  Gastrointestinal: Negative for nausea, vomiting, abdominal pain, blood in stool, abdominal distention and rectal pain.  Genitourinary: Negative for dysuria, hematuria, decreased urine volume, vaginal bleeding, vaginal discharge,  difficulty urinating, vaginal pain and menstrual problem.  Musculoskeletal: Positive for back pain. Negative for joint swelling, gait problem and neck pain.  Skin: Negative for color change, rash and wound.  Neurological: Negative for dizziness, tremors, syncope, speech difficulty and light-headedness.  Hematological: Negative for adenopathy.  Psychiatric/Behavioral: Negative for suicidal ideas, hallucinations, behavioral problems, confusion, sleep disturbance, dysphoric mood and decreased concentration. The patient is not nervous/anxious and is not hyperactive.        Objective:   Physical Exam  Constitutional: She appears well-developed. No distress.  HENT:  Head: Normocephalic.  Right Ear: External ear normal.  Left Ear: External ear normal.  Nose: Nose normal.  Mouth/Throat: Oropharynx is clear and moist.  Eyes: Conjunctivae are normal. Pupils are equal, round, and reactive to light. Right eye exhibits no discharge. Left eye exhibits no discharge.  Neck: Normal range of motion. Neck supple. No JVD present. No tracheal deviation present. No thyromegaly present.  Cardiovascular: Normal rate, regular rhythm and normal heart sounds.   Pulmonary/Chest: No stridor. No respiratory distress. She has no wheezes.  Abdominal: Soft. Bowel sounds are normal. She exhibits no distension and no mass. There is no tenderness. There is no rebound and no guarding.  Musculoskeletal: She exhibits no edema or tenderness.  Lymphadenopathy:    She has no cervical adenopathy.  Neurological: She displays normal reflexes. No cranial nerve deficit. She exhibits normal muscle tone. Coordination normal.  Skin: No rash noted. No erythema.  Psychiatric: She has a normal mood and affect. Her behavior is normal. Judgment and thought content normal.  Looks tired B ronchi  Lab Results  Component Value Date  WBC 11.8* 01/27/2014   HGB 15.3* 06/23/2014   HCT 45.0 06/23/2014   PLT 313.0 01/27/2014   GLUCOSE 100*  06/23/2014   CHOL 221* 07/06/2013   TRIG 200.0* 07/06/2013   HDL 60.90 07/06/2013   LDLDIRECT 126.5 06/14/2012   LDLCALC 120* 07/06/2013   ALT 23 07/06/2013   AST 32 07/06/2013   NA 140 06/23/2014   K 4.0 06/23/2014   CL 101 06/23/2014   CREATININE 0.80 06/23/2014   BUN 25* 06/23/2014   CO2 27 01/27/2014   TSH 1.17 07/06/2013   INR 1.0 09/04/2011   HGBA1C 5.8* 06/04/2012   Chest CT EKG - no new changes     Assessment & Plan:

## 2014-07-17 NOTE — Assessment & Plan Note (Addendum)
Depo-medrol 80 mg IM Symbicort, Proair Zpac To ER if worse

## 2014-07-17 NOTE — Progress Notes (Signed)
Pre visit review using our clinic review tool, if applicable. No additional management support is needed unless otherwise documented below in the visit note. 

## 2014-07-24 ENCOUNTER — Other Ambulatory Visit: Payer: Self-pay | Admitting: *Deleted

## 2014-07-24 MED ORDER — LOSARTAN POTASSIUM 50 MG PO TABS: ORAL_TABLET | ORAL | Status: DC

## 2014-08-29 DIAGNOSIS — H903 Sensorineural hearing loss, bilateral: Secondary | ICD-10-CM | POA: Diagnosis not present

## 2014-08-30 DIAGNOSIS — M542 Cervicalgia: Secondary | ICD-10-CM | POA: Diagnosis not present

## 2014-08-30 DIAGNOSIS — M791 Myalgia: Secondary | ICD-10-CM | POA: Diagnosis not present

## 2014-09-18 ENCOUNTER — Telehealth: Payer: Self-pay | Admitting: Internal Medicine

## 2014-09-18 DIAGNOSIS — R911 Solitary pulmonary nodule: Secondary | ICD-10-CM

## 2014-09-18 NOTE — Telephone Encounter (Signed)
CT and ov due July 2016  Order sent to [facility] for ct and then will set up ov with MW  I spoke with the pt and notified of this and she verbalized understanding  Nothing further needed

## 2014-09-21 ENCOUNTER — Encounter: Payer: Self-pay | Admitting: Cardiology

## 2014-09-21 ENCOUNTER — Ambulatory Visit (INDEPENDENT_AMBULATORY_CARE_PROVIDER_SITE_OTHER): Payer: Medicare Other | Admitting: Cardiology

## 2014-09-21 VITALS — BP 160/90 | HR 84 | Ht 63.0 in | Wt 114.8 lb

## 2014-09-21 DIAGNOSIS — I429 Cardiomyopathy, unspecified: Secondary | ICD-10-CM | POA: Diagnosis not present

## 2014-09-21 DIAGNOSIS — R0600 Dyspnea, unspecified: Secondary | ICD-10-CM

## 2014-09-21 DIAGNOSIS — J441 Chronic obstructive pulmonary disease with (acute) exacerbation: Secondary | ICD-10-CM | POA: Diagnosis not present

## 2014-09-21 DIAGNOSIS — I428 Other cardiomyopathies: Secondary | ICD-10-CM

## 2014-09-21 MED ORDER — PREDNISONE 50 MG PO TABS: ORAL_TABLET | ORAL | Status: DC

## 2014-09-21 NOTE — Progress Notes (Signed)
Amber Snow Date of Birth: 79/25/1934 Medical Record #[id number]  History of Present Illness: [name] is seen back today for a follow up cardiomyopathy. Has a history of nonischemic CM with EF of 35 to 40% by cath and echo from May of 2013. Nuclear at that time showed an EF of 27%. Echo in June of 2014 showed EF was 25 to 30%. She has had minimal nonobstructive disease by cardiac cath in May 2013. Other issues include COPD, past smoker, HTN, PVD, HLD, and bladder tumors.  Repeat Echo in November 2015 showed Normal EF. She had to stop taking aldactone due to leg cramps and bisoprolol due to persistent lightheadedness. These symptoms have resolved. She reports symptoms of increased SOB for the past 2 weeks with some intermittent cough. No fever. No edema. On Symbicort, albuterol and prednisone 5 mg daily. Followed by Dr Melvyn Novas for COPD and pulmonary nodules.   Current Outpatient Prescriptions  Medication Sig Dispense Refill  . albuterol (PROVENTIL HFA;VENTOLIN HFA) 108 (90 BASE) MCG/ACT inhaler Inhale 2 puffs into the lungs every 6 (six) hours as needed. Shortness of breath 8.5 g 5  . ALPRAZolam (XANAX) 0.25 MG tablet Take 0.5-1 tablets (0.125-0.25 mg total) by mouth 2 (two) times daily as needed for anxiety (shortness of breath). 60 tablet 3  . azithromycin (ZITHROMAX) 250 MG tablet As directed 6 tablet 0  . baclofen (LIORESAL) 10 MG tablet Take 10 mg by mouth as needed.     Marland Kitchen BIOTIN PO Take 1 capsule by mouth daily.    . budesonide-formoterol (SYMBICORT) 160-4.5 MCG/ACT inhaler Inhale 2 puffs into the lungs 2 (two) times daily. 1 Inhaler 11  . Cholecalciferol (VITAMIN D) 1000 UNITS capsule Take 1,000 Units by mouth daily.     Marland Kitchen FLUZONE HIGH-DOSE 0.5 ML SUSY   0  . ibuprofen (ADVIL,MOTRIN) 200 MG tablet Take 600 mg by mouth every 6 (six) hours as needed for pain.    Marland Kitchen losartan (COZAAR) 50 MG tablet TAKE 1 TABLET BY MOUTH AT BEDTIME 90 tablet 2  . prednisoLONE 5 MG TABS tablet Take 1 tablet  (5 mg total) by mouth daily. 100 each 5  . traZODone (DESYREL) 50 MG tablet TAKE 1 TABLET BY MOUTH EVERY DAY 90 tablet 2  . zoledronic acid (RECLAST) 5 MG/100ML SOLN Inject 5 mg into the vein once. Every year     No current facility-administered medications for this visit.    Allergies  Allergen Reactions  . Barbiturates Rash  . Penicillins Rash  . Shrimp [Shellfish Allergy] Swelling  . Anoro Ellipta [Umeclidinium-Vilanterol]     cough  . Aspirin Nausea Only  . Beta Adrenergic Blockers     PT STATES TOLD TO AVOID DUE TO COUGH REACTION TO CARDIZEM  . Bisoprolol     SOB and dizzy  . Calcium Channel Blockers     PER PT TOLD TO AVOID DUE TO COUGH REACTION TO CARDIZEM  . Celecoxib Nausea Only  . Ciprofloxacin Other (See Comments)    Red streaks on arm when given IV  . Codeine Other (See Comments)    REACTION: excessive sleep  . Diltiazem Hcl Other (See Comments)    REACTION: cough  . Fluvastatin Sodium   . Ibandronate Sodium Other (See Comments)    REACTION: HAIR LOSS/NAIL CHANGES  . Oxycodone-Acetaminophen Nausea Only  . Plavix [Clopidogrel Bisulfate] Itching  . Rosuvastatin   . Simvastatin     Past Medical History  Diagnosis Date  . Diverticulosis of colon   .  Hyperlipidemia   . History of nephrolithiasis   . Osteopenia   . Arthritis HANDS  . Chronic systolic CHF (congestive heart failure)   . Renal calculus, bilateral   . Bladder cancer   . Bladder calculus   . COPD, moderate PULMOLOGIST-- DR Melvyn Novas    W/ EMPHYSEMA  (COPD  GOLD II)  . Wears hearing aid     BILATERAL  . Nonischemic cardiomyopathy   . LBBB (left bundle branch block)     CHRONIC  . PAC (premature atrial contraction)   . CAD (coronary artery disease) CARDIOLOGSIT-- DR Martinique    05-17-2013minimal nonobstructive CAD per cath with EF of 35 to 40% and normal filling pressures and right heart pressures  . Dyspnea on exertion   . Nodule of right lung     APICAL 1.0CM (MONITORED BY DR Melvyn Novas)  . Myalgia      Past Surgical History  Procedure Laterality Date  . Cosmetic surgery      eyelids  . Left long finger arthroplasty & pulley relase  02-17-2005  . Cysto/ left retrograde pyelogram/ left ureteral stent placement  10-16-2001    STONE  . Pulmonary function analysis  07-31-2009    MODERATE TO SEVERE OBSTRUCTIVE AIRWAY DISEASE/ INSIGNIFICANT RESPONSE TO BRONCHODILATORS/ EMPHYSEMA PATTERN/ MILD DECREASE IN DIFFUSE CAPACITY FEF 25-75 CHANGED BY 8%  . Tubal ligation  AGE 81'S  . Tonsillectomy  81  . Extracorporeal shock wave lithotripsy  2003  . Cataract extraction w/ intraocular lens  implant, bilateral  1998  . Transurethral resection of bladder tumor  07/01/2011    Procedure: TRANSURETHRAL RESECTION OF BLADDER TUMOR (TURBT);  Surgeon: Hanley Ben, MD;  Location: [facility];  Service: Urology;  Laterality: N/A;  CYSTO  . Cystoscopy  07/01/2011    Procedure: CYSTOSCOPY;  Surgeon: Hanley Ben, MD;  Location: [facility];  Service: Urology;  Laterality: N/A;  retrieval l of bladder stone  . Orif finger / thumb fracture    . Cystoscopy N/A 08/20/2012    Procedure: CYSTOSCOPY;  Surgeon: Hanley Ben, MD;  Location: [facility];  Service: Urology;  Laterality: N/A;  fulgeration of bladderm tumors  . Cardiac catheterization  09-12-2011  DR Martinique    MINIMAL NONOBSTRUCTIVE CAD/ MODERATE TO SEVERE LV DYSFUNCTION/  NORMAL RIGHT HEART PRESSURES AND LV FILLING PRESSURES  . Transthoracic echocardiogram  last one 03-15-2014  DR Martinique    GRADE I DIASTOLIC DYSFUNCTION/  confirmed by dr Liane Comber  EF 45% (down from 60-65% 03-12-2013 but up from 25-30% compared to last echo 10-01-2012 /  NORMAL LV SIZE AND WALL THICKNESS/  trivial TR  . Cystoscopy with litholapaxy N/A 04/01/2013    Procedure: CYSTOSCOPY WITH HOLMIUM LASER OF  BLADDER CALCULUS;  Surgeon: Hanley Ben, MD;  Location: [place];  Service: Urology;  Laterality: N/A;  .  Holmium laser application N/A 98/12/2117    Procedure: HOLMIUM LASER APPLICATION;  Surgeon: Hanley Ben, MD;  Location: [place];  Service: Urology;  Laterality: N/A;  . Cystoscopy w/ retrogrades Right 04/01/2013    Procedure: CYSTOSCOPY WITH RETROGRADE PYELOGRAM;  Surgeon: Hanley Ben, MD;  Location: [place];  Service: Urology;  Laterality: Right;  . Left and right heart catheterization with coronary angiogram Bilateral 09/12/2011    Procedure: LEFT AND RIGHT HEART CATHETERIZATION WITH CORONARY ANGIOGRAM;  Surgeon: Kartel Wolbert M Martinique, MD;  Location: [facility] CATH LAB;  Service: Cardiovascular;  Laterality: Bilateral;  . Cystoscopy N/A 06/23/2014  Procedure: CYSTOSCOPY;  Surgeon: Arvil Persons, MD;  Location: [facility];  Service: Urology;  Laterality: N/A;  . Fulguration of bladder tumor  06/23/2014    Procedure: FULGURATION OF BLADDER TUMOR;  Surgeon: Arvil Persons, MD;  Location: [facility];  Service: Urology;;    History  Smoking status  . Former Smoker -- 1.00 packs/day for 50 years  . Types: Cigarettes  . Quit date: 09/27/2011  Smokeless tobacco  . Never Used    History  Alcohol Use No    Family History  Problem Relation Age of Onset  . Heart disease Mother   . Hypertension Mother   . Hypertension Father   . Heart attack Father   . Heart disease Father   . Thyroid disease Sister   . Heart attack Sister   . Stroke Sister   . COPD Sister   . Heart attack Brother   . Cancer Maternal Grandmother     breast cancer  . Diabetes Paternal Grandmother   . Asthma Neg Hx     Review of Systems: The review of systems is per the HPI.  All other systems were reviewed and are negative.  Physical Exam: BP 160/90 mmHg  Pulse 84  Ht 5\' 3"  (1.6 m)  Wt 52.073 kg (114 lb 12.8 oz)  BMI 20.34 kg/m2  SpO2 95% No orthostatic changes. Patient is very pleasant and is in moderate respiratory distress. Skin is warm and dry. Color  is normal.  HEENT is unremarkable.  Neck is supple. No masses. No JVD. Lungs  with diffusely diminished BS. Cardiac exam shows a regular rate and rhythm. Normal S1-2. No gallop or murmur. Abdomen is soft. Extremities are without edema. Gait and ROM are intact. No gross neurologic deficits noted.  LABORATORY DATA:   Lab Results  Component Value Date   WBC 11.8* 01/27/2014   HGB 15.3* 06/23/2014   HCT 45.0 06/23/2014   PLT 313.0 01/27/2014   GLUCOSE 100* 06/23/2014   CHOL 221* 07/06/2013   TRIG 200.0* 07/06/2013   HDL 60.90 07/06/2013   LDLDIRECT 126.5 06/14/2012   LDLCALC 120* 07/06/2013   ALT 23 07/06/2013   AST 32 07/06/2013   NA 140 06/23/2014   K 4.0 06/23/2014   CL 101 06/23/2014   CREATININE 0.80 06/23/2014   BUN 25* 06/23/2014   CO2 27 01/27/2014   TSH 1.17 07/06/2013   INR 1.0 09/04/2011   HGBA1C 5.8* 06/04/2012   Echo Study Conclusions from November 2015  Study Conclusions  - Left ventricle: The cavity size was normal. Wall thickness was normal. Systolic function was moderately reduced. The estimated ejection fraction was in the range of 35% to 40%. Doppler parameters are consistent with abnormal left ventricular relaxation (grade 1 diastolic dysfunction). - Ventricular septum: Septal motion showed abnormal function and dyssynergy.   Ecg today shows NSR with LBBB. I have personally reviewed and interpreted this study.   Assessment / Plan:   1. Nonischemic CM with chronic systolic heart failure - EF normal by Echo in November 2015. - Now just on losartan. Other meds stopped due to side effects. I think her recent symptoms are related to COPD exacerbation.   2. Chronic LBBB   3. CAD - nonobstructive per last cath - managed medically - no symptoms.   4. COPD exacerbation- will give prednisone taper beginning at 50 mg and tapering by 10 mg until back to 5 mg daily. If symptoms persist she will need to followup  With Dr. Melvyn Novas.   I will follow up in  one year.

## 2014-09-21 NOTE — Patient Instructions (Addendum)
We will give you a tapered dose of steroids- prednisone. Begin at 50 mg daily the taper by 10 mg daily until you are back to your current dose.  I will see you in one year

## 2014-09-22 ENCOUNTER — Encounter: Payer: Self-pay | Admitting: Gastroenterology

## 2014-10-02 DIAGNOSIS — C679 Malignant neoplasm of bladder, unspecified: Secondary | ICD-10-CM | POA: Diagnosis not present

## 2014-10-10 DIAGNOSIS — Z961 Presence of intraocular lens: Secondary | ICD-10-CM | POA: Diagnosis not present

## 2014-10-10 DIAGNOSIS — D3132 Benign neoplasm of left choroid: Secondary | ICD-10-CM | POA: Diagnosis not present

## 2014-10-10 DIAGNOSIS — H52203 Unspecified astigmatism, bilateral: Secondary | ICD-10-CM | POA: Diagnosis not present

## 2014-11-01 ENCOUNTER — Ambulatory Visit: Payer: Medicare Other | Admitting: Internal Medicine

## 2014-11-06 ENCOUNTER — Other Ambulatory Visit: Payer: Self-pay | Admitting: Internal Medicine

## 2014-11-08 ENCOUNTER — Ambulatory Visit (INDEPENDENT_AMBULATORY_CARE_PROVIDER_SITE_OTHER)
Admission: RE | Admit: 2014-11-08 | Discharge: 2014-11-08 | Disposition: A | Discharge status: Home / Self Care | Payer: Medicare Other | Source: Ambulatory Visit | Attending: Internal Medicine | Admitting: Internal Medicine

## 2014-11-08 ENCOUNTER — Encounter: Payer: Self-pay | Admitting: Internal Medicine

## 2014-11-08 ENCOUNTER — Other Ambulatory Visit (INDEPENDENT_AMBULATORY_CARE_PROVIDER_SITE_OTHER): Payer: Medicare Other

## 2014-11-08 ENCOUNTER — Ambulatory Visit (INDEPENDENT_AMBULATORY_CARE_PROVIDER_SITE_OTHER): Payer: Medicare Other | Admitting: Internal Medicine

## 2014-11-08 VITALS — BP 130/72 | HR 88 | Wt 114.0 lb

## 2014-11-08 DIAGNOSIS — R609 Edema, unspecified: Secondary | ICD-10-CM | POA: Diagnosis not present

## 2014-11-08 DIAGNOSIS — I2510 Atherosclerotic heart disease of native coronary artery without angina pectoris: Secondary | ICD-10-CM

## 2014-11-08 DIAGNOSIS — M79604 Pain in right leg: Secondary | ICD-10-CM

## 2014-11-08 DIAGNOSIS — E559 Vitamin D deficiency, unspecified: Secondary | ICD-10-CM

## 2014-11-08 DIAGNOSIS — IMO0001 Reserved for inherently not codable concepts without codable children: Secondary | ICD-10-CM

## 2014-11-08 DIAGNOSIS — E785 Hyperlipidemia, unspecified: Secondary | ICD-10-CM

## 2014-11-08 DIAGNOSIS — I429 Cardiomyopathy, unspecified: Secondary | ICD-10-CM

## 2014-11-08 DIAGNOSIS — J449 Chronic obstructive pulmonary disease, unspecified: Secondary | ICD-10-CM

## 2014-11-08 DIAGNOSIS — M545 Low back pain: Secondary | ICD-10-CM

## 2014-11-08 DIAGNOSIS — I2583 Coronary atherosclerosis due to lipid rich plaque: Secondary | ICD-10-CM

## 2014-11-08 DIAGNOSIS — I428 Other cardiomyopathies: Secondary | ICD-10-CM

## 2014-11-08 DIAGNOSIS — J432 Centrilobular emphysema: Secondary | ICD-10-CM | POA: Diagnosis not present

## 2014-11-08 DIAGNOSIS — R911 Solitary pulmonary nodule: Secondary | ICD-10-CM | POA: Diagnosis not present

## 2014-11-08 DIAGNOSIS — C679 Malignant neoplasm of bladder, unspecified: Secondary | ICD-10-CM | POA: Diagnosis not present

## 2014-11-08 LAB — BASIC METABOLIC PANEL
BUN: 17 mg/dL (ref 6–23)
CALCIUM: 9.4 mg/dL (ref 8.4–10.5)
CHLORIDE: 104 meq/L (ref 96–112)
CO2: 30 mEq/L (ref 19–32)
CREATININE: 0.92 mg/dL (ref 0.40–1.20)
GFR: 62.46 mL/min (ref >60.00)
GLUCOSE: 86 mg/dL (ref 70–99)
Potassium: 4.1 mEq/L (ref 3.5–5.1)
Sodium: 140 mEq/L (ref 135–145)

## 2014-11-08 LAB — TSH: TSH: 1.21 u[IU]/mL (ref 0.35–4.50)

## 2014-11-08 LAB — HEPATIC FUNCTION PANEL
ALBUMIN: 4 g/dL (ref 3.5–5.2)
ALT: 16 U/L (ref 0–35)
AST: 24 U/L (ref 0–37)
Alkaline Phosphatase: 63 U/L (ref 39–117)
Bilirubin, Direct: 0.1 mg/dL (ref 0.0–0.3)
TOTAL PROTEIN: 6.7 g/dL (ref 6.0–8.3)
Total Bilirubin: 0.5 mg/dL (ref 0.2–1.2)

## 2014-11-08 NOTE — Assessment & Plan Note (Signed)
On Vit D 

## 2014-11-08 NOTE — Assessment & Plan Note (Signed)
2016 Prednisone 5 mg/d

## 2014-11-08 NOTE — Assessment & Plan Note (Addendum)
Prednisone 5 mg/d Advil prn - reduce dose

## 2014-11-08 NOTE — Progress Notes (Signed)
Pre visit review using our clinic review tool, if applicable. No additional management support is needed unless otherwise documented below in the visit note. 

## 2014-11-08 NOTE — Progress Notes (Signed)
Subjective:  Patient ID: Amber Snow, female    DOB: Sep 19, 1932  Age: 81 y.o. MRN: [id number]  CC: No chief complaint on file.   HPI Amber Snow presents for chronic feet swelling, hot flashes, CAD, COPD, CHF  Outpatient Prescriptions Prior to Visit  Medication Sig Dispense Refill  . albuterol (PROVENTIL HFA;VENTOLIN HFA) 108 (90 BASE) MCG/ACT inhaler Inhale 2 puffs into the lungs every 6 (six) hours as needed. Shortness of breath 8.5 g 5  . ALPRAZolam (XANAX) 0.25 MG tablet Take 0.5-1 tablets (0.125-0.25 mg total) by mouth 2 (two) times daily as needed for anxiety (shortness of breath). 60 tablet 3  . baclofen (LIORESAL) 10 MG tablet Take 10 mg by mouth as needed.     Marland Kitchen BIOTIN PO Take 1 capsule by mouth daily.    . budesonide-formoterol (SYMBICORT) 160-4.5 MCG/ACT inhaler Inhale 2 puffs into the lungs 2 (two) times daily. 1 Inhaler 11  . Cholecalciferol (VITAMIN D) 1000 UNITS capsule Take 1,000 Units by mouth daily.     Marland Kitchen FLUZONE HIGH-DOSE 0.5 ML SUSY   0  . losartan (COZAAR) 50 MG tablet TAKE 1 TABLET BY MOUTH AT BEDTIME 90 tablet 2  . traZODone (DESYREL) 50 MG tablet TAKE 1 TABLET BY MOUTH EVERY DAY 90 tablet 2  . zoledronic acid (RECLAST) 5 MG/100ML SOLN Inject 5 mg into the vein once. Every year    . azithromycin (ZITHROMAX) 250 MG tablet As directed 6 tablet 0  . ibuprofen (ADVIL,MOTRIN) 200 MG tablet Take 600 mg by mouth every 6 (six) hours as needed for pain.    . prednisoLONE 5 MG TABS tablet Take 1 tablet (5 mg total) by mouth daily. 100 each 5  . predniSONE (DELTASONE) 50 MG tablet Start 50 mg daily taper by 10 mg each day until reaches 10 mg then resume previous dose 5 mg daily. 5 tablet 0   No facility-administered medications prior to visit.    ROS Review of Systems  Constitutional: Positive for chills and fatigue. Negative for activity change, appetite change and unexpected weight change.  HENT: Negative for congestion, mouth sores and sinus pressure.     Eyes: Negative for visual disturbance.  Respiratory: Positive for shortness of breath. Negative for cough, chest tightness and wheezing.   Cardiovascular: Positive for leg swelling.  Gastrointestinal: Negative for nausea and abdominal pain.  Genitourinary: Negative for frequency, difficulty urinating and vaginal pain.  Musculoskeletal: Negative for back pain and gait problem.  Skin: Negative for pallor and rash.  Neurological: Negative for dizziness, tremors, weakness, numbness and headaches.  Psychiatric/Behavioral: Negative for confusion and sleep disturbance. The patient is not nervous/anxious.     Objective:  BP 130/72 mmHg  Pulse 88  Wt 114 lb (51.71 kg)  SpO2 95%  BP Readings from Last 3 Encounters:  11/08/14 130/72  09/21/14 160/90  07/17/14 150/70    Wt Readings from Last 3 Encounters:  11/08/14 114 lb (51.71 kg)  09/21/14 114 lb 12.8 oz (52.073 kg)  07/17/14 112 lb (50.803 kg)    Physical Exam  Constitutional: She appears well-developed. No distress.  HENT:  Head: Normocephalic.  Right Ear: External ear normal.  Left Ear: External ear normal.  Nose: Nose normal.  Mouth/Throat: Oropharynx is clear and moist.  Eyes: Conjunctivae are normal. Pupils are equal, round, and reactive to light. Right eye exhibits no discharge. Left eye exhibits no discharge.  Neck: Normal range of motion. Neck supple. No JVD present. No tracheal deviation present. No thyromegaly  present.  Cardiovascular: Normal rate, regular rhythm and normal heart sounds.   Pulmonary/Chest: No stridor. No respiratory distress. She has no wheezes.  Abdominal: Soft. Bowel sounds are normal. She exhibits no distension and no mass. There is no tenderness. There is no rebound and no guarding.  Musculoskeletal: She exhibits edema. She exhibits no tenderness.  Lymphadenopathy:    She has no cervical adenopathy.  Neurological: She displays normal reflexes. No cranial nerve deficit. She exhibits normal muscle  tone. Coordination normal.  Skin: No rash noted. No erythema.  Psychiatric: She has a normal mood and affect. Her behavior is normal. Judgment and thought content normal.  trace edema B feet  Lab Results  Component Value Date   WBC 11.8* 01/27/2014   HGB 15.3* 06/23/2014   HCT 45.0 06/23/2014   PLT 313.0 01/27/2014   GLUCOSE 100* 06/23/2014   CHOL 221* 07/06/2013   TRIG 200.0* 07/06/2013   HDL 60.90 07/06/2013   LDLDIRECT 126.5 06/14/2012   LDLCALC 120* 07/06/2013   ALT 23 07/06/2013   AST 32 07/06/2013   NA 140 06/23/2014   K 4.0 06/23/2014   CL 101 06/23/2014   CREATININE 0.80 06/23/2014   BUN 25* 06/23/2014   CO2 27 01/27/2014   TSH 1.17 07/06/2013   INR 1.0 09/04/2011   HGBA1C 5.8* 06/04/2012    Dg Chest Port 1 View  06/23/2014   CLINICAL DATA:  81 year old female with shortness of breath, cough. Current history of COPD and bladder cancer. Initial encounter.  EXAM: PORTABLE CHEST - 1 VIEW  COMPARISON:  Chest CT 06/13/2014 and earlier.  FINDINGS: Portable AP semi upright view at 1439 hours. Large lung volumes and emphysema. Stable cardiac size and mediastinal contours. Stable lung markings. No pneumothorax or pleural effusion. No acute pulmonary opacity identified.  IMPRESSION: Stable chest with chronic lung disease and no new cardiopulmonary abnormality identified. See also recent chest CT findings 06/13/2014   Electronically Signed   By: Genevie Ann M.D.   On: 06/23/2014 14:56    Assessment & Plan:   Diagnoses and all orders for this visit:  Coronary artery disease due to lipid rich plaque  Vitamin D deficiency  LBP radiating to both legs  COPD bronchitis  Nonischemic cardiomyopathy  Edema  I have discontinued Amber Snow's ibuprofen, prednisoLONE, and azithromycin. I am also having her maintain her Vitamin D, BIOTIN PO, zoledronic acid, albuterol, baclofen, FLUZONE HIGH-DOSE, budesonide-formoterol, ALPRAZolam, losartan, traZODone, and predniSONE.  Meds ordered  this encounter  Medications  . predniSONE (DELTASONE) 5 MG tablet    Sig: Take 5 mg by mouth daily.    Refill:  5     Follow-up: No Follow-up on file.  Walker Kehr, MD

## 2014-11-08 NOTE — Assessment & Plan Note (Signed)
Reduce Advil - and d/c NAD

## 2014-11-08 NOTE — Assessment & Plan Note (Signed)
Dr Martinique ASA allergic

## 2014-11-08 NOTE — Assessment & Plan Note (Addendum)
Reduce Advil - and d/c On Spironolactone - pt is able to use it prn only

## 2014-11-09 NOTE — Progress Notes (Signed)
Quick Note:  Spoke with pt and notified of results per Dr. Wert. Pt verbalized understanding and denied any questions.  ______ 

## 2014-11-10 ENCOUNTER — Ambulatory Visit (INDEPENDENT_AMBULATORY_CARE_PROVIDER_SITE_OTHER): Payer: Medicare Other | Admitting: Internal Medicine

## 2014-11-10 ENCOUNTER — Encounter: Payer: Self-pay | Admitting: Internal Medicine

## 2014-11-10 VITALS — BP 134/82 | HR 73 | Ht 63.0 in | Wt 113.4 lb

## 2014-11-10 DIAGNOSIS — R911 Solitary pulmonary nodule: Secondary | ICD-10-CM

## 2014-11-10 DIAGNOSIS — J449 Chronic obstructive pulmonary disease, unspecified: Secondary | ICD-10-CM | POA: Diagnosis not present

## 2014-11-10 NOTE — Patient Instructions (Signed)
If you are satisfied with your treatment plan,  let your doctor know and he/she can either refill your medications or you can return here when your prescription runs out.     If in any way you are not 100% satisfied,  please tell us.  If 100% better, tell your friends!  Pulmonary follow up is as needed   

## 2014-11-10 NOTE — Progress Notes (Signed)
Subjective:    Patient ID: Amber Snow, female    DOB: 09/30/1932  MRN: [id number]   Brief patient profile:  [id number] with GOLD II copd by pfts 07/2009 quit smoking June 2013  due to progressive doe x 5 years worse since quit referred to pulmonary clinic 06/17/2012 by Dr Ignacia Palma p admit :  Admit date: 06/01/2012  Discharge date: 06/05/2012  Discharge Diagnoses:  Active Problems:  HYPERLIPIDEMIA  LEFT BUNDLE BRANCH BLOCK  COPD  LOW BACK PAIN, CHRONIC  LV dysfunction  Lung nodule > outpt pet rec COPD exacerbation  Nonischemic cardiomyopathy   COPD exacerbation  -CT angiogram of the chest was negative for pulmonary embolus  -Patient was started on Solu-Medrol, Levaquin, and bronchodilators  -Wheezing has improved with Solu-Medrol  -changed to by mouth prednisone without decompensation  -Continue every 6 hour albuterol-And when necessary for shortness of breath  -Continue levofloxacin--adjust Levaquin dose for renal function--final dose to be given on 06/06/2012 to complete 5 days  -I believe that her continuing dyspnea on exertion is a combination of her COPD exacerbation, cardiomyopathy, and deconditioning.  -The patient was subsequently weaned off of oxygen supplementation. Her oxygen saturation was 90-94% with activity on room air.  -She will go home with a prednisone wean starting with 50 mg on day #1 and decreasing by 10 mg daily    06/17/2012 1st pulmonary eval/ [facility] Eriksson s/p hosp in January 2014 cc doe x 50 ft variable improvement with saba =proaire rec Symbicort 2 every 12 hours Spiriva once daily  Only use your albuterol (proaire) as a rescue medication Work on inhaler technique:    03/14/2013 f/u ov/Lylith Bebeau re: copd GOLD II/ SPN LUL Chief Complaint  Patient presents with  . Followup with cxr    Pt states her breathing seems to be doing well. She denies any new co's today. Pt states she uses rescue inhaler 2-3 times per month on average.   freq cough, choking sensation  esp in am assoc with dry mouth and feeling congested but no excess mucus or noct exac- overall much better though and can really tell when misses dose of symbicort Not really limited from desired activities due to sob on smb and spiriva rec Stop spiriva for now to see what difference this makes    07/28/2013 f/u ov/Winn Muehl re: COPD GOLD II on maint symb 160 2bid/ off spiriva as made no difference in doe Chief Complaint  Patient presents with  . Follow-up    Pt reports having increased SOB today. She relates this to her BP med Bisoprolol. She stopped med temporarily and breathing improved, but is worse since she started back on med.    off bisoprolol breathing and energy much better  Off bisoprolol needed saba  maybe once a week, back on it using saba once daily rec Stop bisoprolol No change resp meds    11/11/2014 f/u ov/Lacorey Brusca re: GOLD II copd/ f/u SPN on symbicort 160 2bid/ rare need for saba  Chief Complaint  Patient presents with  . Follow-up    Breathing is doing well still using rescue inhaler prn.    Not limited by breathing from desired activities  But not very active - could not name any activity she wants to do where breathing stops/slows her  No obvious day to day  variabilty or assoc cough cp or chest tightness, subjective wheeze overt sinus or hb symptoms. No unusual exp hx or h/o childhood pna/ asthma or knowledge of premature birth.   Sleeping  ok without nocturnal  or early am exacerbation  of respiratory  c/o's or need for noct saba. Also denies any obvious fluctuation of symptoms with weather or environmental changes or other aggravating or alleviating factors except as outlined above  Current Medications, Allergies, Past Medical History, Past Surgical History, Family History, and Social History were reviewed in [employer] record.  ROS  The following are not active complaints unless bolded sore throat, dysphagia, dental problems, itching, sneezing,   nasal congestion or excess/ purulent secretions, ear ache,   fever, chills, sweats, unintended wt loss, pleuritic or exertional cp, hemoptysis,  orthopnea pnd or leg swelling, presyncope, palpitations, heartburn, abdominal pain, anorexia, nausea, vomiting, diarrhea  or change in bowel or urinary habits, change in stools or urine, dysuria,hematuria,  rash, arthralgias, visual complaints, headache, numbness weakness or ataxia or problems with walking or coordination,  change in mood/affect or memory.            Objective:   Physical Exam  Somber thin wf nad  07/29/2012  105  >102 09/24/2012 > 10/26/2012  99 > 12/10/2012 99 > 104 03/14/2013 > 06/20/2013 107 > 109 07/28/2013 > 11/09/2013  109 > 11/10/2014   113     HEENT mild turbinate edema.  Oropharynx no thrush or excess pnd or cobblestoning.  No JVD or cervical adenopathy. Mild accessory muscle hypertrophy. Trachea midline, nl thryroid. Chest was hyperinflated by percussion with diminished breath sounds and moderate increased exp time without wheeze. Hoover sign positive at mid inspiration. Regular rate and rhythm without murmur gallop or rub or increase P2 or edema.  Abd: no hsm, nl excursion. Ext warm without cyanosis or clubbing.          I personally reviewed images and agree with radiology impression as follows:  CT s contrast 11/08/14 1. The 8 mm right lower lobe lung nodule has resolved and was likely infectious or inflammatory. 2. Advanced centrilobular emphysema with other pulmonary nodules which are similar back to 11/07/2013 and can be considered benign. 3. Atherosclerosis, including within the coronary arteries. 4. Right nephrolithiasis            Assessment & Plan:

## 2014-11-11 ENCOUNTER — Encounter: Payer: Self-pay | Admitting: Internal Medicine

## 2014-11-11 NOTE — Assessment & Plan Note (Signed)
-   Not seen on cxr  03/08/2012 and obvious on cxr 06/01/12 - 06/01/12 CT chest Right apical lung nodule is 1.0 cm in diameter. - CT 12/09/2012    New spiculated nodular density in the left upper lobe. Follow- up CT chest without contrast is recommended in 3 months.  . - CT  06/20/2013 1. Slight interval decrease in overall conspicuity of a nodular lesion in the left upper lobe, as described in detail above. Additional follow-up in 6 months is recommended  >  11/07/2013 CT> Stable 7 mm left upper lobe nodule and other scattered bilateral pulmonary nodules, which are likely benign.   - CT 11/08/2014 resolved    Given the rapid development or R then L nodules I never thought they were likely to be malignant  I had an extended final summary discussion with the patient reviewing all relevant studies completed to date and  lasting 15 to 20 minutes of a 25 minute visit on the following issues:    1) no further ct's needed at this point unless new unexplained resp symptoms develop   2) Each maintenance medication was reviewed in detail including most importantly the difference between maintenance and as needed and under what circumstances the prns are to be used.  Please see instructions for details which were reviewed in writing and the patient given a copy.    3) pulmonary f/u is prn

## 2014-11-11 NOTE — Assessment & Plan Note (Signed)
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

## 2014-11-21 ENCOUNTER — Telehealth: Payer: Self-pay | Admitting: Internal Medicine

## 2014-11-21 NOTE — Telephone Encounter (Signed)
Please call patient regarding lab work

## 2014-11-21 NOTE — Telephone Encounter (Signed)
I called pt- She states she received a letter advising her to check with PCP as to whether she needs to be on statin drugs. Please advise does she need fasting labs or statin drugs?  She c/o constant foot edema. She feels like the spironolactone is not working when she does take it. What can she do?    Copy of most recent labs mailed to pt per her request.

## 2014-11-22 NOTE — Telephone Encounter (Signed)
No. She is allergic to them Thx

## 2014-11-22 NOTE — Telephone Encounter (Signed)
Pt's husband informed about statins.She feels like the spironolactone is not working   What should she do for foot edema?

## 2014-11-22 NOTE — Telephone Encounter (Signed)
I do not have Spironolactone on the med list Thx

## 2014-11-23 NOTE — Telephone Encounter (Signed)
Has she been taking Spironolactone? Thx

## 2014-11-23 NOTE — Telephone Encounter (Signed)
Med history shows you gave her Spironolactone 25 mg 1/2 tab qam # 15 in 08/2013. Does she need OV or new Rx?

## 2014-11-23 NOTE — Telephone Encounter (Signed)
Yes prn, however she can't tell a difference when she does. I am not sure who has been prescribing it.

## 2014-11-24 MED ORDER — FUROSEMIDE 20 MG PO TABS: 20.0000 mg | ORAL_TABLET | Freq: Every day | ORAL | Status: DC | PRN

## 2014-11-24 NOTE — Telephone Encounter (Signed)
Pt informed

## 2014-11-24 NOTE — Telephone Encounter (Signed)
Stop Spironolactone Start Furosemide prn Thx

## 2014-12-14 DIAGNOSIS — M542 Cervicalgia: Secondary | ICD-10-CM | POA: Diagnosis not present

## 2014-12-14 DIAGNOSIS — M791 Myalgia: Secondary | ICD-10-CM | POA: Diagnosis not present

## 2014-12-14 DIAGNOSIS — M47812 Spondylosis without myelopathy or radiculopathy, cervical region: Secondary | ICD-10-CM | POA: Diagnosis not present

## 2014-12-27 DIAGNOSIS — M5010 Cervical disc disorder with radiculopathy, unspecified cervical region: Secondary | ICD-10-CM | POA: Diagnosis not present

## 2014-12-27 DIAGNOSIS — M5412 Radiculopathy, cervical region: Secondary | ICD-10-CM | POA: Diagnosis not present

## 2014-12-27 DIAGNOSIS — M4802 Spinal stenosis, cervical region: Secondary | ICD-10-CM | POA: Diagnosis not present

## 2015-01-10 DIAGNOSIS — M791 Myalgia: Secondary | ICD-10-CM | POA: Diagnosis not present

## 2015-01-10 DIAGNOSIS — M47812 Spondylosis without myelopathy or radiculopathy, cervical region: Secondary | ICD-10-CM | POA: Diagnosis not present

## 2015-01-10 DIAGNOSIS — M542 Cervicalgia: Secondary | ICD-10-CM | POA: Diagnosis not present

## 2015-01-11 DIAGNOSIS — C671 Malignant neoplasm of dome of bladder: Secondary | ICD-10-CM | POA: Diagnosis not present

## 2015-01-17 ENCOUNTER — Other Ambulatory Visit: Payer: Self-pay | Admitting: Urology

## 2015-01-17 HISTORY — PX: TOOTH EXTRACTION: SUR596

## 2015-01-18 ENCOUNTER — Other Ambulatory Visit: Payer: Self-pay | Admitting: Urology

## 2015-01-24 ENCOUNTER — Encounter: Payer: Self-pay | Admitting: Internal Medicine

## 2015-01-24 ENCOUNTER — Ambulatory Visit (INDEPENDENT_AMBULATORY_CARE_PROVIDER_SITE_OTHER): Payer: Medicare Other | Admitting: Internal Medicine

## 2015-01-24 ENCOUNTER — Encounter (HOSPITAL_COMMUNITY): Payer: Self-pay

## 2015-01-24 VITALS — BP 168/90 | HR 110 | Wt 113.0 lb

## 2015-01-24 DIAGNOSIS — I2510 Atherosclerotic heart disease of native coronary artery without angina pectoris: Secondary | ICD-10-CM

## 2015-01-24 DIAGNOSIS — I5022 Chronic systolic (congestive) heart failure: Secondary | ICD-10-CM

## 2015-01-24 DIAGNOSIS — J449 Chronic obstructive pulmonary disease, unspecified: Secondary | ICD-10-CM

## 2015-01-24 DIAGNOSIS — I2583 Coronary atherosclerosis due to lipid rich plaque: Secondary | ICD-10-CM

## 2015-01-24 MED ORDER — PREDNISONE 10 MG PO TABS: ORAL_TABLET | ORAL | Status: DC

## 2015-01-24 MED ORDER — METHYLPREDNISOLONE ACETATE 80 MG/ML IJ SUSP
80.0000 mg | Freq: Once | INTRAMUSCULAR | Status: AC
  Administered 2015-01-24: 80 mg via INTRAMUSCULAR

## 2015-01-24 NOTE — Progress Notes (Signed)
Subjective:  Patient ID: Amber Snow, female    DOB: 20-Apr-1933  Age: 81 y.o. MRN: [id number]  CC: No chief complaint on file.   HPI [facility] presents for SOB - worse x 1 week. No URI sx's  Outpatient Prescriptions Prior to Visit  Medication Sig Dispense Refill  . acetaminophen (TYLENOL) 500 MG tablet Take 1,000 mg by mouth every 4 (four) hours as needed for moderate pain.    Marland Kitchen albuterol (PROVENTIL HFA;VENTOLIN HFA) 108 (90 BASE) MCG/ACT inhaler Inhale 2 puffs into the lungs every 6 (six) hours as needed. Shortness of breath (Patient taking differently: Inhale 2 puffs into the lungs every 6 (six) hours as needed for wheezing or shortness of breath. Shortness of breath) 8.5 g 5  . ALPRAZolam (XANAX) 0.25 MG tablet Take 0.5-1 tablets (0.125-0.25 mg total) by mouth 2 (two) times daily as needed for anxiety (shortness of breath). 60 tablet 3  . baclofen (LIORESAL) 10 MG tablet Take 10 mg by mouth 3 (three) times daily as needed for muscle spasms.     Marland Kitchen BIOTIN PO Take 1 capsule by mouth daily.    . budesonide-formoterol (SYMBICORT) 160-4.5 MCG/ACT inhaler Inhale 2 puffs into the lungs 2 (two) times daily. 1 Inhaler 11  . Cholecalciferol (VITAMIN D) 1000 UNITS capsule Take 1,000 Units by mouth daily.     . furosemide (LASIX) 20 MG tablet Take 1 tablet (20 mg total) by mouth daily as needed for edema. 30 tablet 5  . ibuprofen (ADVIL,MOTRIN) 200 MG tablet Take 600-800 mg by mouth every 6 (six) hours as needed for moderate pain.    Marland Kitchen losartan (COZAAR) 50 MG tablet TAKE 1 TABLET BY MOUTH AT BEDTIME 90 tablet 2  . predniSONE (DELTASONE) 5 MG tablet Take 5 mg by mouth daily.  5  . traZODone (DESYREL) 50 MG tablet TAKE 1 TABLET BY MOUTH EVERY DAY (Patient taking differently: TAKE 1 TABLET BY MOUTH EVERY DAY NIGHTLY.) 90 tablet 2  . zoledronic acid (RECLAST) 5 MG/100ML SOLN Inject 5 mg into the vein once. Every year     No facility-administered medications prior to visit.    ROS Review  of Systems  Constitutional: Negative for chills, activity change, appetite change, fatigue and unexpected weight change.  HENT: Negative for congestion, mouth sores and sinus pressure.   Eyes: Negative for visual disturbance.  Respiratory: Positive for shortness of breath. Negative for cough and chest tightness.   Gastrointestinal: Negative for nausea and abdominal pain.  Genitourinary: Negative for frequency, difficulty urinating and vaginal pain.  Musculoskeletal: Negative for back pain and gait problem.  Skin: Negative for pallor and rash.  Neurological: Negative for dizziness, tremors, weakness, numbness and headaches.  Psychiatric/Behavioral: Negative for confusion and sleep disturbance.    Objective:  BP 168/90 mmHg  Pulse 110  Wt 113 lb (51.256 kg)  SpO2 95%  BP Readings from Last 3 Encounters:  01/24/15 168/90  11/10/14 134/82  11/08/14 130/72    Wt Readings from Last 3 Encounters:  01/24/15 113 lb (51.256 kg)  11/10/14 113 lb 6.4 oz (51.438 kg)  11/08/14 114 lb (51.71 kg)    Physical Exam  Constitutional: She appears well-developed. No distress.  HENT:  Head: Normocephalic.  Right Ear: External ear normal.  Left Ear: External ear normal.  Nose: Nose normal.  Mouth/Throat: Oropharynx is clear and moist.  Eyes: Conjunctivae are normal. Pupils are equal, round, and reactive to light. Right eye exhibits no discharge. Left eye exhibits no discharge.  Neck: Normal range of motion. Neck supple. No JVD present. No tracheal deviation present. No thyromegaly present.  Cardiovascular: Regular rhythm and normal heart sounds.  Exam reveals no gallop.   Pulmonary/Chest: No stridor. She is in respiratory distress. She has no wheezes. She has no rales. She exhibits no tenderness.  Abdominal: Soft. Bowel sounds are normal. She exhibits no distension and no mass. There is no tenderness. There is no rebound and no guarding.  Musculoskeletal: She exhibits edema. She exhibits no  tenderness.  Lymphadenopathy:    She has no cervical adenopathy.  Neurological: She displays normal reflexes. No cranial nerve deficit. She exhibits normal muscle tone. Coordination normal.  Skin: No rash noted. No erythema.  Psychiatric: She has a normal mood and affect. Her behavior is normal. Judgment and thought content normal.  Tachycardic Slight lip pursing Trace ankle edema  Lab Results  Component Value Date   WBC 11.8* 01/27/2014   HGB 15.3* 06/23/2014   HCT 45.0 06/23/2014   PLT 313.0 01/27/2014   GLUCOSE 86 11/08/2014   CHOL 221* 07/06/2013   TRIG 200.0* 07/06/2013   HDL 60.90 07/06/2013   LDLDIRECT 126.5 06/14/2012   LDLCALC 120* 07/06/2013   ALT 16 11/08/2014   AST 24 11/08/2014   NA 140 11/08/2014   K 4.1 11/08/2014   CL 104 11/08/2014   CREATININE 0.92 11/08/2014   BUN 17 11/08/2014   CO2 30 11/08/2014   TSH 1.21 11/08/2014   INR 1.0 09/04/2011   HGBA1C 5.8* 06/04/2012    Ct Chest Wo Contrast  11/08/2014   CLINICAL DATA:  Followup of pulmonary nodule. History of COPD. CHF. Dyspnea on exertion. Bladder cancer.  EXAM: CT CHEST WITHOUT CONTRAST  TECHNIQUE: Multidetector CT imaging of the chest was performed following the standard protocol without IV contrast.  COMPARISON:  06/23/2014 plain film and 06/13/2014 CT.  FINDINGS: Mediastinum/Nodes: Probable accessory breast tissue medially bilaterally. This is not significantly changed. Dense aortic and branch vessel atherosclerosis. Normal heart size, without pericardial effusion. Multivessel coronary artery atherosclerosis. No mediastinal or definite hilar adenopathy, given limitations of unenhanced CT.  Lungs/Pleura: No pleural fluid. Mild biapical pleural parenchymal scarring. Secretions within the left side of the trachea. Lower lobe predominant bronchial wall thickening. Marked bullous type emphysema. 3 mm right middle lobe lung nodule is similar over prior exams and benign.  3 mm right lower lobe pulmonary nodule on  image 52 is similar to on the prior.  The 8 mm more inferior right lower lobe pulmonary nodule has resolved. Probable scarring along the posterior right upper lobe and origin of the right major fissure on image 20.  3 mm left lower lobe pulmonary nodule on image 41 is unchanged.  More medial 3 mm left lower lobe pulmonary nodule is similar on image 43.  2 mm right upper lobe pulmonary nodule on image 23 is similar.  Upper abdomen: Bilateral fat containing Bochdalek's hernias. Normal imaged portions of the liver, spleen, stomach, pancreas, adrenal glands. Suspect punctate right renal collecting system calculi. Normal imaged left kidney. Advanced aortic and branch vessel atherosclerosis throughout the abdomen.  Musculoskeletal: No acute osseous abnormality. Degenerative disc disease is involves the L1-2 level.  IMPRESSION: 1. The 8 mm right lower lobe lung nodule has resolved and was likely infectious or inflammatory. 2. Advanced centrilobular emphysema with other pulmonary nodules which are similar back to 11/07/2013 and can be considered benign. 3.  Atherosclerosis, including within the coronary arteries. 4. Right nephrolithiasis.   Electronically Signed   By:  Abigail Miyamoto M.D.   On: 11/08/2014 15:29    Assessment & Plan:   Diagnoses and all orders for this visit:  COPD GOLD II  -     methylPREDNISolone acetate (DEPO-MEDROL) injection 80 mg; Inject 1 mL (80 mg total) into the muscle once.  Coronary artery disease due to lipid rich plaque  Chronic systolic CHF (congestive heart failure)  Other orders -     predniSONE (DELTASONE) 10 MG tablet; Prednisone 10 mg: take 4 tabs a day x 3 days; then 3 tabs a day x 4 days; then 2 tabs a day x 4 days, then 1 tab a day x 6 days, then go back to 5 mg/day. Take pc.  I am having Ms. Knoebel start on predniSONE. I am also having her maintain her Vitamin D, BIOTIN PO, zoledronic acid, albuterol, baclofen, budesonide-formoterol, ALPRAZolam, losartan, traZODone,  predniSONE, furosemide, ibuprofen, acetaminophen, cephALEXin, and chlorhexidine. We administered methylPREDNISolone acetate.  Meds ordered this encounter  Medications  . cephALEXin (KEFLEX) 500 MG capsule    Sig: TAKE 4 CAPSULES BY MOUTH NOW THEN 1 CAP 4 TIMES A DAY    Refill:  0  . chlorhexidine (PERIDEX) 0.12 % solution    Sig: RINSE WITH 1/2 OZ FOR 1 MINUTE TWICE A DAY IN SURGICAL AREA    Refill:  0  . predniSONE (DELTASONE) 10 MG tablet    Sig: Prednisone 10 mg: take 4 tabs a day x 3 days; then 3 tabs a day x 4 days; then 2 tabs a day x 4 days, then 1 tab a day x 6 days, then go back to 5 mg/day. Take pc.    Dispense:  38 tablet    Refill:  0  . methylPREDNISolone acetate (DEPO-MEDROL) injection 80 mg    Sig:      Follow-up: Return in about 4 weeks (around 02/21/2015) for a follow-up visit.  Walker Kehr, MD

## 2015-01-24 NOTE — Assessment & Plan Note (Addendum)
Worse  Cont MDIs Prednisone 10 mg: take 4 tabs a day x 3 days; then 3 tabs a day x 4 days; then 2 tabs a day x 4 days, then 1 tab a day x 6 days, then go back to 5 mg/day. Take pc. Depomedrol 80 mg IM Go to ER if worse

## 2015-01-24 NOTE — Assessment & Plan Note (Signed)
No angina 

## 2015-01-24 NOTE — Progress Notes (Signed)
Pre visit review using our clinic review tool, if applicable. No additional management support is needed unless otherwise documented below in the visit note. 

## 2015-01-24 NOTE — Assessment & Plan Note (Signed)
No signs of CHF

## 2015-01-29 ENCOUNTER — Other Ambulatory Visit (HOSPITAL_COMMUNITY): Payer: Self-pay | Admitting: *Deleted

## 2015-01-29 NOTE — Progress Notes (Signed)
lov dr wret pulmonary 11-10-14 epic Chest xray 1 view 06-23-14 epic Echo 03-15-14 epic lov cardiology dr [place] 11-113-15 epic

## 2015-01-29 NOTE — Patient Instructions (Addendum)
Amber Snow  01/29/2015   Your procedure is scheduled on: Tuesday February 06, 2015  Report to [facility]  Entrance take [place]  elevators to 3rd floor to  Early at 530 AM.  Call this number if you have problems the morning of surgery [phone]   Remember: ONLY 1 PERSON MAY GO WITH YOU TO SHORT STAY TO GET  READY MORNING OF [name].  Do not eat food or drink liquids :After Midnight.     Take these medicines the morning of surgery with A SIP OF WATER: Albuterol inhaler prn and bring inhaler, Symbicort, Prednisone DO NOT TAKE ANY DIABETIC MEDICATIONS DAY OF YOUR SURGERY                               You may not have any metal on your body including hair pins and              piercings  Do not wear jewelry, make-up, lotions, powders or perfumes, deodorant             Do not wear nail polish.  Do not shave  48 hours prior to surgery.              Men may shave face and neck.   Do not bring valuables to the [facility]. [facility].  Contacts, dentures or bridgework may not be worn into surgery.  Leave suitcase in the car. After surgery it may be brought to your room.     Patients discharged the day of surgery will not be allowed to drive home.  Name and phone number of your driver:SPOUSE [facility] [phone]  Special Instructions: N/A              Please read over the following fact sheets you were given: _____________________________________________________________________             [facility] - Preparing for Surgery Before surgery, you can play an important role.  Because skin is not sterile, your skin needs to be as free of germs as possible.  You can reduce the number of germs on your skin by washing with CHG (chlorahexidine gluconate) soap before surgery.  CHG is an antiseptic cleaner which kills germs and bonds with the skin to continue killing germs even after  washing. Please DO NOT use if you have an allergy to CHG or antibacterial soaps.  If your skin becomes reddened/irritated stop using the CHG and inform your nurse when you arrive at Short Stay. Do not shave (including legs and underarms) for at least 48 hours prior to the first CHG shower.  You may shave your face/neck. Please follow these instructions carefully:  1.  Shower with CHG Soap the night before surgery and the  morning of Surgery.  2.  If you choose to wash your hair, wash your hair first as usual with your  normal  shampoo.  3.  After you shampoo, rinse your hair and body thoroughly to remove the  shampoo.                           4.  Use CHG as you would any other liquid soap.  You can apply chg directly  to the skin and wash                       Gently with a scrungie or clean washcloth.  5.  Apply the CHG Soap to your body ONLY FROM THE NECK DOWN.   Do not use on face/ open                           Wound or open sores. Avoid contact with eyes, ears mouth and genitals (private parts).                       Wash face,  Genitals (private parts) with your normal soap.             6.  Wash thoroughly, paying special attention to the area where your surgery  will be performed.  7.  Thoroughly rinse your body with warm water from the neck down.  8.  DO NOT shower/wash with your normal soap after using and rinsing off  the CHG Soap.                9.  Pat yourself dry with a clean towel.            10.  Wear clean pajamas.            11.  Place clean sheets on your bed the night of your first shower and do not  sleep with pets. Day of Surgery : Do not apply any lotions/deodorants the morning of surgery.  Please wear clean clothes to the Snow/surgery Snow.  FAILURE TO FOLLOW THESE INSTRUCTIONS MAY RESULT IN THE CANCELLATION OF YOUR SURGERY PATIENT SIGNATURE_________________________________  NURSE  SIGNATURE__________________________________  ________________________________________________________________________

## 2015-01-30 ENCOUNTER — Encounter (INDEPENDENT_AMBULATORY_CARE_PROVIDER_SITE_OTHER): Payer: Self-pay

## 2015-01-30 ENCOUNTER — Encounter (HOSPITAL_COMMUNITY): Payer: Self-pay

## 2015-01-30 ENCOUNTER — Encounter (HOSPITAL_COMMUNITY)
Admission: RE | Admit: 2015-01-30 | Discharge: 2015-01-30 | Disposition: A | Discharge status: Home / Self Care | Payer: Medicare Other | Source: Ambulatory Visit | Attending: Urology | Admitting: Urology

## 2015-01-30 DIAGNOSIS — Z01818 Encounter for other preprocedural examination: Secondary | ICD-10-CM | POA: Diagnosis not present

## 2015-01-30 DIAGNOSIS — D494 Neoplasm of unspecified behavior of bladder: Secondary | ICD-10-CM | POA: Diagnosis not present

## 2015-01-30 HISTORY — DX: Pneumonia, unspecified organism: J18.9

## 2015-01-30 HISTORY — DX: Nocturia: R35.1

## 2015-01-30 LAB — CBC
HEMATOCRIT: 42.9 % (ref 36.0–46.0)
Hemoglobin: 14.1 g/dL (ref 12.0–15.0)
MCH: 31.3 pg (ref 26.0–34.0)
MCHC: 32.9 g/dL (ref 30.0–36.0)
MCV: 95.3 fL (ref 78.0–100.0)
PLATELETS: 379 10*3/uL (ref 150–400)
RBC: 4.5 MIL/uL (ref 3.87–5.11)
RDW: 13.2 % (ref 11.5–15.5)
WBC: 14.2 10*3/uL — AB (ref 4.0–10.5)

## 2015-01-30 LAB — BASIC METABOLIC PANEL
Anion gap: 9 (ref 5–15)
BUN: 28 mg/dL — AB (ref 6–20)
CO2: 28 mmol/L (ref 22–32)
CREATININE: 0.89 mg/dL (ref 0.44–1.00)
Calcium: 9.9 mg/dL (ref 8.9–10.3)
Chloride: 103 mmol/L (ref 101–111)
GFR calc Af Amer: >60 mL/min (ref >60)
GFR, EST NON AFRICAN AMERICAN: 60 mL/min — AB (ref >60)
Glucose, Bld: 125 mg/dL — ABNORMAL HIGH (ref 65–99)
POTASSIUM: 5.6 mmol/L — AB (ref 3.5–5.1)
SODIUM: 140 mmol/L (ref 135–145)

## 2015-01-30 NOTE — Progress Notes (Signed)
CBC AND BMET RESULTS FAXED TO MAIN MEDICAL RECORD NUMBER AT [facility] AND TO DR ESKRISGE POD FAX AT [place]

## 2015-02-05 NOTE — H&P (Signed)
History of Present Illness   Amber Snow had cystoscopy, fulguration of superficial bladder tumors on 2/26. She had intravesical BCG.  She is doing well. She voids well. Urinalysis shows 3-10 RBCs, 0-5 WBCs. Cystoscopy today shows multiple small recurrent tumors at the dome and the bladder base. She has a long history of kidney stone. CT scan in February of this year did not show any suspicious renal lesions. She quit smoking 3 years ago.   Past Medical History Problems  1. History of Calculus of ureter (N20.1) 2. History of kidney stones (S97.530)  Surgical History Problems  1. History of Cataract Surgery 2. History of Cystoscopy With Fragmentation Of Bladder Calculus Over 2.5cm 3. History of Cystoscopy With Fulguration Medium Lesion (2-5cm) 4. History of Cystoscopy With Fulguration Minor Lesion (Under 78m) 5. History of Cystoscopy With Fulguration Minor Lesion (Under 560m 6. History of Hand Surgery 7. History of Lithotripsy 8. History of Tonsillectomy  Current Meds 1. Albuterol Sulfate TABS;  Therapy: (Recorded:25Mar2015) to Recorded 2. Ibuprofen 800 MG Oral Tablet;  Therapy: [id number] Recorded 3. Losartan Potassium 50 MG Oral Tablet;  Therapy: (Recorded:01Dec2014) to Recorded 4. PredniSONE 5 MG Oral Tablet; 1 once daily;  Therapy: (Recorded:06Jun2016) to Recorded 5. ProAir HFA 108 (90 Base) MCG/ACT Inhalation Aerosol Solution;  Therapy: 22Aug2012 to Recorded 6. Reclast 5 MG/100ML Intravenous Solution;  Therapy: (Recorded:25Mar2015) to Recorded 7. Symbicort 160-4.5 MCG/ACT Inhalation Aerosol;  Therapy: [id number] Recorded 8. TraZODone HCl - 50 MG Oral Tablet;  Therapy: 21Jan2013 to Recorded 9. Vitamin D TABS;  Therapy: (Recorded:25Mar2015) to Recorded  Allergies Medication  1. Cipro TABS 2. Boniva KIT 3. Penicillins  Family History Problems  1. Family history of Family Health Status Number Of Children   1 son 2. Family history of Father Deceased At Age ____ 3.  Family history of Heart Disease : Father 4. Family history of Hypertension : Mother 5.34Family history of Mother Deceased At Age ____ 6.72Family history of Nephrolithiasis  Social History Problems  1. Denied: History of Alcohol Use 2. Caffeine Use   3 to 4 cups 3. Former smoker ([phone]. Marital History - Currently Married 5. Tobacco use (Z72.0)  Review of Systems Genitourinary, constitutional, skin, eye, otolaryngeal, hematologic/lymphatic, cardiovascular, pulmonary, endocrine, musculoskeletal, gastrointestinal, neurological and psychiatric system(s) were reviewed and pertinent findings if present are noted and are otherwise negative.    Vitals Vital Signs [Data Includes: Last 1 Day]  Recorded: 15Sep2016 11:14AM  Weight: 116 lb  BMI Calculated: 20.55 BSA Calculated: 1.53 Blood Pressure: 170 / 75 Heart Rate: 100 Respiration: 18  Physical Exam Constitutional: Well nourished and well developed . No acute distress.  ENT:. The ears and nose are normal in appearance.  Neck: The appearance of the neck is normal and no neck mass is present.  Pulmonary: No respiratory distress and normal respiratory rhythm and effort.  Cardiovascular: Heart rate and rhythm are normal . No peripheral edema.  Abdomen: The abdomen is soft and nontender. No masses are palpated. No CVA tenderness. No hernias are palpable. No hepatosplenomegaly noted.  Genitourinary:  The bladder is normal on palpation, non tender and not distended. The perineum is normal on inspection.  Lymphatics: The femoral and inguinal nodes are not enlarged or tender.  Skin: Normal skin turgor, no visible rash and no visible skin lesions.  Neuro/Psych:. Mood and affect are appropriate.    Results/Data Urine [Data Includes: Last 1 Day]   [id number] YELLOW   APPEARANCE CLEAR   SPECIFIC GRAVITY 1.010  pH 6.5   GLUCOSE NEGATIVE   BILIRUBIN NEGATIVE   KETONE NEGATIVE   BLOOD 1+   PROTEIN NEGATIVE   NITRITE NEGATIVE    LEUKOCYTE ESTERASE NEGATIVE   SQUAMOUS EPITHELIAL/HPF 0-5 HPF  WBC 0-5 WBC/HPF  RBC 3-10 RBC/HPF  BACTERIA NONE SEEN HPF  CRYSTALS NONE SEEN HPF  CASTS NONE SEEN LPF  Yeast NONE SEEN HPF   Procedure  Procedure: Cystoscopy  Chaperone Present: Marin Roberts, CMA.  Indication: History of Urothelial Carcinoma.  Informed Consent: Risks, benefits, and potential adverse events were discussed and informed consent was obtained from the patient . Specific risks including, but not limited to bleeding, infection, pain, allergic reaction etc. were explained.  Prep: The patient was prepped with betadine.  Anesthesia:. Local anesthesia was administered intraurethrally with 2% lidocaine jelly.  Antibiotic prophylaxis: Ciprofloxacin.  Procedure Note:  Urethral meatus:. No abnormalities.  Anterior urethra: No abnormalities.  Bladder: Visulization was clear. The ureteral orifices were in the normal anatomic position bilaterally. Multiple tumors were identified in the bladder. A papillary tumor was seen in the bladder. This tumor was located near the dome of the bladder. Another papillary tumor was seen in the bladder. This tumor was located at the base of the bladder. The patient tolerated the procedure well.  Complications: None.    Assessment Assessed  1. Malignant tumor of urinary bladder (C67.9)  Plan  Health Maintenance  1. UA With REFLEX; [Do Not Release]; Status:Resulted - Requires Verification;   Done:  [id number] 10:30AM  Needs cystoscopy, bladder biopsy, fulguration bladder tumor, possible TURBT. The procedure, risks, benefits were explained to the patient. The risks include but are not limited to hemorrhage, infection, bladder injury. She understands and wishes to proceed. I asked Dr Junious Silk to perform the procedure since I am going to retire soon. He is agreeable.    Signatures Electronically signed by : Lowella Bandy, M.D.; Jan 11 2015  8:08PM EST  Add: I discussed patient with Dr.  Janice Norrie. Last upper track imaging Feb 2016 negative.

## 2015-02-06 ENCOUNTER — Ambulatory Visit (HOSPITAL_COMMUNITY): Payer: Medicare Other | Admitting: Anesthesiology

## 2015-02-06 ENCOUNTER — Ambulatory Visit (HOSPITAL_COMMUNITY)
Admission: RE | Admit: 2015-02-06 | Discharge: 2015-02-06 | Disposition: A | Discharge status: Home / Self Care | Payer: Medicare Other | Source: Ambulatory Visit | Attending: Urology | Admitting: Urology

## 2015-02-06 ENCOUNTER — Encounter (HOSPITAL_COMMUNITY): Payer: Self-pay | Admitting: *Deleted

## 2015-02-06 ENCOUNTER — Encounter (HOSPITAL_COMMUNITY)
Admission: RE | Disposition: A | Discharge status: Home / Self Care | Payer: Self-pay | Source: Ambulatory Visit | Attending: Urology

## 2015-02-06 DIAGNOSIS — Z841 Family history of disorders of kidney and ureter: Secondary | ICD-10-CM | POA: Diagnosis not present

## 2015-02-06 DIAGNOSIS — Z408 Encounter for other prophylactic surgery: Secondary | ICD-10-CM | POA: Diagnosis not present

## 2015-02-06 DIAGNOSIS — C679 Malignant neoplasm of bladder, unspecified: Secondary | ICD-10-CM | POA: Insufficient documentation

## 2015-02-06 DIAGNOSIS — C678 Malignant neoplasm of overlapping sites of bladder: Secondary | ICD-10-CM | POA: Diagnosis not present

## 2015-02-06 DIAGNOSIS — Z7952 Long term (current) use of systemic steroids: Secondary | ICD-10-CM | POA: Insufficient documentation

## 2015-02-06 DIAGNOSIS — J449 Chronic obstructive pulmonary disease, unspecified: Secondary | ICD-10-CM | POA: Diagnosis not present

## 2015-02-06 DIAGNOSIS — Z7983 Long term (current) use of bisphosphonates: Secondary | ICD-10-CM | POA: Diagnosis not present

## 2015-02-06 DIAGNOSIS — Z79899 Other long term (current) drug therapy: Secondary | ICD-10-CM | POA: Diagnosis not present

## 2015-02-06 DIAGNOSIS — N3289 Other specified disorders of bladder: Secondary | ICD-10-CM | POA: Diagnosis not present

## 2015-02-06 DIAGNOSIS — I2510 Atherosclerotic heart disease of native coronary artery without angina pectoris: Secondary | ICD-10-CM | POA: Insufficient documentation

## 2015-02-06 DIAGNOSIS — Z9849 Cataract extraction status, unspecified eye: Secondary | ICD-10-CM | POA: Insufficient documentation

## 2015-02-06 DIAGNOSIS — Z87442 Personal history of urinary calculi: Secondary | ICD-10-CM | POA: Insufficient documentation

## 2015-02-06 DIAGNOSIS — Z87891 Personal history of nicotine dependence: Secondary | ICD-10-CM | POA: Insufficient documentation

## 2015-02-06 DIAGNOSIS — I509 Heart failure, unspecified: Secondary | ICD-10-CM | POA: Diagnosis not present

## 2015-02-06 DIAGNOSIS — Z791 Long term (current) use of non-steroidal anti-inflammatories (NSAID): Secondary | ICD-10-CM | POA: Diagnosis not present

## 2015-02-06 DIAGNOSIS — Z7951 Long term (current) use of inhaled steroids: Secondary | ICD-10-CM | POA: Insufficient documentation

## 2015-02-06 DIAGNOSIS — N1339 Other hydronephrosis: Secondary | ICD-10-CM | POA: Diagnosis not present

## 2015-02-06 DIAGNOSIS — D494 Neoplasm of unspecified behavior of bladder: Secondary | ICD-10-CM | POA: Diagnosis present

## 2015-02-06 DIAGNOSIS — D090 Carcinoma in situ of bladder: Secondary | ICD-10-CM | POA: Diagnosis not present

## 2015-02-06 HISTORY — PX: CYSTOSCOPY WITH BIOPSY: SHX5122

## 2015-02-06 HISTORY — PX: FULGURATION OF BLADDER TUMOR: SHX6261

## 2015-02-06 HISTORY — PX: TRANSURETHRAL RESECTION OF BLADDER TUMOR: SHX2575

## 2015-02-06 SURGERY — CYSTOSCOPY, WITH BIOPSY
Anesthesia: General

## 2015-02-06 MED ORDER — EPHEDRINE SULFATE 50 MG/ML IJ SOLN
INTRAMUSCULAR | Status: AC
  Filled 2015-02-06: qty 1

## 2015-02-06 MED ORDER — TRAMADOL HCL 50 MG PO TABS: 50.0000 mg | ORAL_TABLET | Freq: Four times a day (QID) | ORAL | Status: DC | PRN

## 2015-02-06 MED ORDER — STERILE WATER FOR IRRIGATION IR SOLN
Status: DC | PRN
  Administered 2015-02-06: 1000 mL

## 2015-02-06 MED ORDER — LACTATED RINGERS IV SOLN
INTRAVENOUS | Status: DC | PRN
  Administered 2015-02-06 (×2): via INTRAVENOUS

## 2015-02-06 MED ORDER — MIDAZOLAM HCL 5 MG/5ML IJ SOLN
INTRAMUSCULAR | Status: DC | PRN
  Administered 2015-02-06 (×2): 1 mg via INTRAVENOUS

## 2015-02-06 MED ORDER — FENTANYL CITRATE (PF) 100 MCG/2ML IJ SOLN
INTRAMUSCULAR | Status: DC | PRN
  Administered 2015-02-06 (×2): 50 ug via INTRAVENOUS
  Administered 2015-02-06 (×6): 25 ug via INTRAVENOUS

## 2015-02-06 MED ORDER — PROMETHAZINE HCL 25 MG/ML IJ SOLN: 6.2500 mg | INTRAMUSCULAR | Status: DC | PRN

## 2015-02-06 MED ORDER — FENTANYL CITRATE (PF) 250 MCG/5ML IJ SOLN
INTRAMUSCULAR | Status: AC
  Filled 2015-02-06: qty 25

## 2015-02-06 MED ORDER — SODIUM CHLORIDE 0.9 % IJ SOLN
INTRAMUSCULAR | Status: AC
  Filled 2015-02-06: qty 10

## 2015-02-06 MED ORDER — PROPOFOL 10 MG/ML IV BOLUS
INTRAVENOUS | Status: DC | PRN
  Administered 2015-02-06: 110 mg via INTRAVENOUS

## 2015-02-06 MED ORDER — LIDOCAINE HCL 1 % IJ SOLN
INTRAMUSCULAR | Status: DC | PRN
  Administered 2015-02-06: 60 mg via INTRADERMAL

## 2015-02-06 MED ORDER — PHENYLEPHRINE 40 MCG/ML (10ML) SYRINGE FOR IV PUSH (FOR BLOOD PRESSURE SUPPORT)
PREFILLED_SYRINGE | INTRAVENOUS | Status: AC
  Filled 2015-02-06: qty 10

## 2015-02-06 MED ORDER — ONDANSETRON HCL 4 MG/2ML IJ SOLN
INTRAMUSCULAR | Status: AC
  Filled 2015-02-06: qty 2

## 2015-02-06 MED ORDER — DEXAMETHASONE SODIUM PHOSPHATE 10 MG/ML IJ SOLN
INTRAMUSCULAR | Status: DC | PRN
  Administered 2015-02-06: 10 mg via INTRAVENOUS

## 2015-02-06 MED ORDER — ONDANSETRON HCL 4 MG/2ML IJ SOLN
INTRAMUSCULAR | Status: DC | PRN
  Administered 2015-02-06: 4 mg via INTRAVENOUS

## 2015-02-06 MED ORDER — PROPOFOL 10 MG/ML IV BOLUS
INTRAVENOUS | Status: AC
  Filled 2015-02-06: qty 20

## 2015-02-06 MED ORDER — LACTATED RINGERS IV SOLN: INTRAVENOUS | Status: DC

## 2015-02-06 MED ORDER — CEFAZOLIN SODIUM 1-5 GM-% IV SOLN
1.0000 g | Freq: Once | INTRAVENOUS | Status: DC
  Administered 2015-02-06: 2 g via INTRAVENOUS

## 2015-02-06 MED ORDER — METHYLENE BLUE 1 % INJ SOLN: 1.0000 mL | Freq: Once | INTRAMUSCULAR | Status: DC

## 2015-02-06 MED ORDER — HYDROMORPHONE HCL 1 MG/ML IJ SOLN: 0.2500 mg | INTRAMUSCULAR | Status: DC | PRN

## 2015-02-06 MED ORDER — EPHEDRINE SULFATE 50 MG/ML IJ SOLN
INTRAMUSCULAR | Status: DC | PRN
  Administered 2015-02-06 (×2): 5 mg via INTRAVENOUS

## 2015-02-06 MED ORDER — CEFAZOLIN SODIUM-DEXTROSE 2-3 GM-% IV SOLR
INTRAVENOUS | Status: AC
  Filled 2015-02-06: qty 50

## 2015-02-06 MED ORDER — SODIUM CHLORIDE 0.9 % IR SOLN
Status: DC | PRN
  Administered 2015-02-06: 12000 mL via INTRAVESICAL

## 2015-02-06 MED ORDER — LIDOCAINE HCL 1 % IJ SOLN
INTRAMUSCULAR | Status: AC
  Filled 2015-02-06: qty 20

## 2015-02-06 MED ORDER — CEFAZOLIN SODIUM-DEXTROSE 2-3 GM-% IV SOLR: 2.0000 g | Freq: Once | INTRAVENOUS | Status: DC

## 2015-02-06 MED ORDER — PHENYLEPHRINE HCL 10 MG/ML IJ SOLN
INTRAMUSCULAR | Status: DC | PRN
  Administered 2015-02-06: 40 ug via INTRAVENOUS

## 2015-02-06 MED ORDER — IOHEXOL 300 MG/ML  SOLN
INTRAMUSCULAR | Status: DC | PRN
  Administered 2015-02-06: 50 mL

## 2015-02-06 MED ORDER — NITROFURANTOIN MONOHYD MACRO 100 MG PO CAPS: 100.0000 mg | ORAL_CAPSULE | Freq: Every day | ORAL | Status: DC

## 2015-02-06 SURGICAL SUPPLY — 18 items
BAG URINE DRAINAGE (UROLOGICAL SUPPLIES) IMPLANT
BAG URO CATCHER STRL LF (DRAPE) ×3 IMPLANT
CATH FOLEY 2WAY SLVR  5CC 16FR (CATHETERS) ×2
CATH FOLEY 2WAY SLVR 5CC 16FR (CATHETERS) IMPLANT
CATH URET 5FR 28IN OPEN ENDED (CATHETERS) ×2 IMPLANT
DRAPE CAMERA CLOSED 9X96 (DRAPES) ×3 IMPLANT
ELECT BIVAP BIPO 22/24 DONUT (ELECTROSURGICAL) ×3
ELECTRD BIVAP BIPO 22/24 DONUT (ELECTROSURGICAL) IMPLANT
GLOVE BIOGEL M STRL SZ7.5 (GLOVE) ×3 IMPLANT
GOWN STRL REUS W/TWL XL LVL3 (GOWN DISPOSABLE) ×3 IMPLANT
GUIDEWIRE STR DUAL SENSOR (WIRE) ×2 IMPLANT
LOOP CUT BIPOLAR 24F LRG (ELECTROSURGICAL) IMPLANT
LOOPS RESECTOSCOPE DISP (ELECTROSURGICAL) ×2 IMPLANT
MANIFOLD NEPTUNE II (INSTRUMENTS) ×3 IMPLANT
PACK CYSTO (CUSTOM PROCEDURE TRAY) ×3 IMPLANT
STENT URET 6FRX24 CONTOUR (STENTS) ×2 IMPLANT
TUBING CONNECTING 10 (TUBING) ×2 IMPLANT
TUBING CONNECTING 10' (TUBING) ×1

## 2015-02-06 NOTE — Anesthesia Procedure Notes (Signed)
Procedure Name: LMA Insertion Date/Time: 02/06/2015 7:40 AM Performed by: Chyrel Masson Pre-anesthesia Checklist: Patient identified, Emergency Drugs available, Suction available and Patient being monitored Patient Re-evaluated:Patient Re-evaluated prior to inductionOxygen Delivery Method: Circle system utilized and Simple face mask Preoxygenation: Pre-oxygenation with 100% oxygen Intubation Type: IV induction LMA: LMA with gastric port inserted LMA Size: 4.0 Number of attempts: 1 Placement Confirmation: positive ETCO2 and breath sounds checked- equal and bilateral Dental Injury: Teeth and Oropharynx as per pre-operative assessment

## 2015-02-06 NOTE — Discharge Instructions (Signed)
Ureteral Stent Implantation, Care After Refer to this sheet in the next few weeks. These instructions provide you with information on caring for yourself after your procedure. Your health care provider may also give you more specific instructions. Your treatment has been planned according to current medical practices, but problems sometimes occur. Call your health care provider if you have any problems or questions after your procedure. WHAT TO EXPECT AFTER THE PROCEDURE You should be back to normal activity within 48 hours after the procedure. Nausea and vomiting may occur and are commonly the result of anesthesia. It is common to experience sharp pain in the back or lower abdomen and penis with voiding. This is caused by movement of the ends of the stent with the act of urinating.It usually goes away within minutes after you have stopped urinating. HOME CARE INSTRUCTIONS Make sure to drink plenty of fluids. You may have small amounts of bleeding, causing your urine to be red. This is normal. Certain movements may trigger pain or a feeling that you need to urinate. You may be given medicines to prevent infection or bladder spasms. Be sure to take all medicines as directed. Only take over-the-counter or prescription medicines for pain, discomfort, or fever as directed by your health care provider. Do not take aspirin, as this can make bleeding worse. Your stent will be left in until the blockage is resolved. This may take 2 weeks or longer, depending on the reason for stent implantation. You may have an X-ray exam to make sure your ureter is open and that the stent has not moved out of position (migrated). The stent can be removed by your health care provider in the office. Medicines may be given for comfort while the stent is being removed. Be sure to keep all follow-up appointments so your health care provider can check that you are healing properly. SEEK MEDICAL CARE IF:  You experience increasing  pain.  Your pain medicine is not working. SEEK IMMEDIATE MEDICAL CARE IF:  Your urine is dark red or has blood clots.  You are leaking urine (incontinent).  You have a fever, chills, feeling sick to your stomach (nausea), or vomiting.  Your pain is not relieved by pain medicine.  The end of the stent comes out of the urethra.  You are unable to urinate.   This information is not intended to replace advice given to you by your health care provider. Make sure you discuss any questions you have with your health care provider.   Document Released: 12/15/2012 Document Revised: 04/19/2013 Document Reviewed: 10/27/2014 Elsevier Interactive Patient Education 2016 [place], Adult A Foley catheter is a soft, flexible tube. This tube is placed into your bladder to drain pee (urine). If you go home with this catheter in place, follow the instructions below. TAKING CARE OF THE CATHETER  Wash your hands with soap and water.  Put soap and water on a clean washcloth.  Clean the skin where the tube goes into your body.  Clean away from the tube site.  Never wipe toward the tube.  Clean the area using a circular motion.  Remove all the soap. Pat the area dry with a clean towel. For males, reposition the skin that covers the end of the penis (foreskin).  Attach the tube to your leg with tape or a leg strap. Do not stretch the tube tight. If you are using tape, remove any stickiness left behind by past tape you used.  Keep the drainage bag below your hips. Keep it off the floor.  Check your tube during the day. Make sure it is working and draining. Make sure the tube does not curl, twist, or bend.  Do not pull on the tube or try to take it out. TAKING CARE OF THE DRAINAGE BAGS You will have a large overnight drainage bag and a small leg bag. You may wear the overnight bag any time. Never wear the small bag at night. Follow the directions below. Emptying the  Drainage Bag Empty your drainage bag when it is  - full or at least 2-3 times a day.  Wash your hands with soap and water.  Keep the drainage bag below your hips.  Hold the dirty bag over the toilet or clean container.  Open the pour spout at the bottom of the bag. Empty the pee into the toilet or container. Do not let the pour spout touch anything.  Clean the pour spout with a gauze pad or cotton ball that has rubbing alcohol on it.  Close the pour spout.  Attach the bag to your leg with tape or a leg strap.  Wash your hands well. Changing the Drainage Bag Change your bag once a month or sooner if it starts to smell or look dirty.  1. Wash your hands with soap and water. 2. Pinch the rubber tube so that pee does not spill out. 3. Disconnect the catheter tube from the drainage tube at the connection valve. Do not let the tubes touch anything. 4. Clean the end of the catheter tube with an alcohol wipe. Clean the end of a the drainage tube with a different alcohol wipe. 5. Connect the catheter tube to the drainage tube of the clean drainage bag. 6. Attach the new bag to the leg with tape or a leg strap. Avoid attaching the new bag too tightly. 7. Wash your hands well. Cleaning the Drainage Bag 1. Wash your hands with soap and water. 2. Wash the bag in warm, soapy water. 3. Rinse the bag with warm water. 4. Fill the bag with a mixture of white vinegar and water (1 cup vinegar to 1 quart warm water [.2 liter vinegar to 1 liter warm water]). Close the bag and soak it for 30 minutes in the solution. 5. Rinse the bag with warm water. 6. Hang the bag to dry with the pour spout open and hanging downward. 7. Store the clean bag (once it is dry) in a clean plastic bag. 8. Wash your hands well. PREVENT INFECTION  Wash your hands before and after touching your tube.  Take showers every day. Wash the skin where the tube enters your body. Do not take baths. Replace wet leg straps with dry  ones, if this applies.  Do not use powders, sprays, or lotions on the genital area. Only use creams, lotions, or ointments as told by your doctor.  For females, wipe from front to back after going to the bathroom.  Drink enough fluids to keep your pee clear or pale yellow unless you are told not to have too much fluid (fluid restriction).  Do not let the drainage bag or tubing touch or lie on the floor.  Wear cotton underwear to keep the area dry. GET HELP IF:  Your pee is cloudy or smells unusually bad.  Your tube becomes clogged.  You are not draining pee into the bag or your bladder feels full.  Your tube starts to leak. GET  HELP RIGHT AWAY IF:  You have pain, puffiness (swelling), redness, or yellowish-white fluid (pus) where the tube enters the body.  You have pain in the belly (abdomen), legs, lower back, or bladder.  You have a fever.  You see blood fill the tube, or your pee is pink or red.  You feel sick to your stomach (nauseous), throw up (vomit), or have chills.  Your tube gets pulled out. MAKE SURE YOU:   Understand these instructions.  Will watch your condition.  Will get help right away if you are not doing well or get worse.   This information is not intended to replace advice given to you by your health care provider. Make sure you discuss any questions you have with your health care provider.   Document Released: 08/09/2012 Document Revised: 05/05/2014 Document Reviewed: 08/09/2012 Elsevier Interactive Patient Education 2016 [place] Anesthesia, Adult, Care After Refer to this sheet in the next few weeks. These instructions provide you with information on caring for yourself after your procedure. Your health care provider may also give you more specific instructions. Your treatment has been planned according to current medical practices, but problems sometimes occur. Call your health care provider if you have any problems or  questions after your procedure. WHAT TO EXPECT AFTER THE PROCEDURE After the procedure, it is typical to experience: Sleepiness. Nausea and vomiting. HOME CARE INSTRUCTIONS For the first 24 hours after general anesthesia: Have a responsible person with you. Do not drive a car. If you are alone, do not take public transportation. Do not drink alcohol. Do not take medicine that has not been prescribed by your health care provider. Do not sign important papers or make important decisions. You may resume a normal diet and activities as directed by your health care provider. Change bandages (dressings) as directed. If you have questions or problems that seem related to general anesthesia, call the hospital and ask for the anesthetist or anesthesiologist on call. SEEK MEDICAL CARE IF: You have nausea and vomiting that continue the day after anesthesia. You develop a rash. SEEK IMMEDIATE MEDICAL CARE IF:  You have difficulty breathing. You have chest pain. You have any allergic problems.   This information is not intended to replace advice given to you by your health care provider. Make sure you discuss any questions you have with your health care provider.   Document Released: 07/21/2000 Document Revised: 05/05/2014 Document Reviewed: 08/13/2011 Elsevier Interactive Patient Education [employer].

## 2015-02-06 NOTE — Interval H&P Note (Signed)
History and Physical Interval Note:  02/06/2015 7:23 AM  Amber Snow  has presented today for surgery, with the diagnosis of RECURRENT BLADDER TUMOR  The various methods of treatment have been discussed with the patient and family. After consideration of risks, benefits and other options for treatment, the patient has consented to  Procedure(s): CYSTO WITH BLADDER BIOPSY (N/A) FULGURATION OF BLADDER TUMORS (N/A) POSSIBLE TRANSURETHRAL RESECTION OF BLADDER TUMOR (TURBT) (N/A) as a surgical intervention .  The patient's history has been reviewed, patient examined, no change in status, stable for surgery.  I have reviewed the patient's chart and labs.  Questions were answered to the patient's satisfaction. She has had no dysuria or fever. She specifically denies any facial/tongue/throat swelling, SOB, severe rash or necrosis of mucous membranes with PCN. She has a "rash" when she was a child with PCN. I discussed the risks with cefazolin and she elected to proceed. Discussed with her risks of bbx vs TURBT including infection, bleeding, bladder perf among others. Discussed use of [place] in PACU and nature, r/b. Discussed possible need for prolonged foley catheter.    Amber Snow

## 2015-02-06 NOTE — Progress Notes (Signed)
Foley connected to leg bag with instructions

## 2015-02-06 NOTE — Transfer of Care (Signed)
Immediate Anesthesia Transfer of Care Note  Patient: Amber Snow  Procedure(s) Performed: Procedure(s): CYSTO WITH BLADDER BIOPSY (N/A) FULGURATION OF BLADDER TUMORS (N/A)  TRANSURETHRAL RESECTION OF BLADDER TUMOR (TURBT) GREATER THAN 5 CM; RIGHT RETROGRADE, RIGHT STENT PLACEMENT (N/A)  Patient Location: PACU  Anesthesia Type:General  Level of Consciousness: awake, alert  and oriented  Airway & Oxygen Therapy: Patient Spontanous Breathing and Patient connected to face mask oxygen  Post-op Assessment: Report given to RN and Post -op Vital signs reviewed and stable  Post vital signs: Reviewed and stable  Last Vitals:  Filed Vitals:   02/06/15 0531  BP: 152/70  Pulse: 105  Temp: 36.4 C  Resp: 18    Complications: No apparent anesthesia complications

## 2015-02-06 NOTE — Anesthesia Preprocedure Evaluation (Signed)
Anesthesia Evaluation  Patient identified by MRN, date of birth, ID band Patient awake    Reviewed: Allergy & Precautions, NPO status , Patient's Chart, lab work & pertinent test results  Airway Mallampati: II  TM Distance: >3 FB Neck ROM: Full    Dental no notable dental hx.    Pulmonary COPD, former smoker,    Pulmonary exam normal breath sounds clear to auscultation       Cardiovascular + CAD and +CHF  Normal cardiovascular exam Rhythm:Regular Rate:Normal     Neuro/Psych negative neurological ROS  negative psych ROS   GI/Hepatic negative GI ROS, Neg liver ROS,   Endo/Other  negative endocrine ROS  Renal/GU negative Renal ROS  negative genitourinary   Musculoskeletal negative musculoskeletal ROS (+)   Abdominal   Peds negative pediatric ROS (+)  Hematology negative hematology ROS (+)   Anesthesia Other Findings   Reproductive/Obstetrics negative OB ROS                             Anesthesia Physical Anesthesia Plan  ASA: III  Anesthesia Plan: General   Post-op Pain Management:    Induction: Intravenous  Airway Management Planned: LMA  Additional Equipment:   Intra-op Plan:   Post-operative Plan: Extubation in OR  Informed Consent: I have reviewed the patients History and Physical, chart, labs and discussed the procedure including the risks, benefits and alternatives for the proposed anesthesia with the patient or authorized representative who has indicated his/her understanding and acceptance.   Dental advisory given  Plan Discussed with: CRNA and Surgeon  Anesthesia Plan Comments:         Anesthesia Quick Evaluation

## 2015-02-06 NOTE — Anesthesia Postprocedure Evaluation (Signed)
  Anesthesia Post-op Note  Patient: Amber Snow  Procedure(s) Performed: Procedure(s) (LRB): CYSTO WITH BLADDER BIOPSY (N/A) FULGURATION OF BLADDER TUMORS (N/A)  TRANSURETHRAL RESECTION OF BLADDER TUMOR (TURBT) GREATER THAN 5 CM; RIGHT RETROGRADE, RIGHT STENT PLACEMENT (N/A)  Patient Location: PACU  Anesthesia Type: General  Level of Consciousness: awake and alert   Airway and Oxygen Therapy: Patient Spontanous Breathing  Post-op Pain: mild  Post-op Assessment: Post-op Vital signs reviewed, Patient's Cardiovascular Status Stable, Respiratory Function Stable, Patent Airway and No signs of Nausea or vomiting  Last Vitals:  Filed Vitals:   02/06/15 1000  BP: 154/65  Pulse: 90  Temp:   Resp: 16    Post-op Vital Signs: stable   Complications: No apparent anesthesia complications

## 2015-02-06 NOTE — Op Note (Signed)
Preoperative diagnosis: Malignant neoplasm of the bladder Postoperative diagnosis: Malignant neoplasm of the bladder overlapping sites  Procedure: TURBT greater than 5 cm Right retrograde pyelogram Right ureteral stent placement Cystogram  Surgeon: Junious Silk  Anesthesia: Gen.  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  Findings: On exam under anesthesia the bulb was normal without mass or lesion, a urethral caruncle was present. The bladder was palpably normal.  On cystoscopy the urethra was normal, the   right side of the trigone in the right ureteral orifice was surrounded by papillary tumor. the left ureteral orifice was clear but tumor was close to it.  There are multiple areas of papillary tumor which appeared superficial. A carpeted the left bladder wall several areas in the dome. An carpeted around the right ureteral orifice and up the right side. There are also 2 or 3 areas along the anterior bladder neck. Representative portions were biopsied from the right left and dome and the rest was ablated nor resected with the loop and the button.  Right retrograde pyelogram-this outlined a single ureter single collecting system unit without filling defect, stricture or dilation.  Cystogram-this outlined a normal bladder contour without extravasation. The bladder was drained and scout imaging revealed no residual contrast in the pelvis. She was filled to about 280 mL.  Description of procedure: After consent was obtained patient brought to the operating room. After adequate anesthesia she was placed in lithotomy position and prepped and draped in the usual sterile fashion. The cystoscope was passed per urethra and the bladder inspected. I then used the cold cup biopsy forceps to biopsy the right side of the bladder and the dome. These biopsies were then fulgurated. With the loop in and ablated all of the  tumor at the dome and the bladder neck. The been approached the right side and ablated and fulgurated most of this tumor. I passed the cold cup biopsy forceps again a biopsied another section on the right side and sent with the right biopsies. This was then fulgurated and all the tumor was ablated. It was encroaching the right bladder neck as well. The left ureteral orifice was not involved and there was excellent clear efflux.  There was an area right superior that was fulgurated.  The right area was then approached. I ablated some of the tissue around the right ureteral orifice. I then went well medial into the cold cup rigid biopsy forceps and biopsied here. This biopsy as well as the last right biopsy were  deep into the muscle but no apparent perforation. then fulgurated this area and came down inferior to it and while doing that the right ureteral orifice cut set into the ablation and could not be readily located. All visible tumor in the bladder had been eradicated but I suspect there are multiple other early areas. The efflux from the left was excellent and faint and not readily located from the right. Therefore used a 5 [place] open-ended catheter and a sensor wire to probe around and was able to locate the right ureteral orifice. This the 5 [place] open-ended catheter was advanced into the proximal ureter and the wire removed. Retrograde injection of contrast was performed confirming proper placement. The wire was coiled in the collecting system and the 5 [place] open-ended catheter removed. The wire was backloaded on the cystoscope and a 6 x 26 cm stent was advanced. There is good coil in the renal pelvis and a good  coil in the bladder.   A 16 French Foley catheter was placed in the bladder was filled to about 280 mL. No extravasation of contrast was noted. The bladder was drained. The urine was clear. She was awakened and taken to cover him in stable condition.   Complications: None   Blood loss:  Minimal   Specimens to pathology: #1 right bladder biopsy  #2 dome biopsy  #3 left biopsy   Drains: 16 French Foley, 6 x 26 and a meter right ureteral stent  Disposition: Patient stable to PACU

## 2015-02-07 ENCOUNTER — Other Ambulatory Visit: Payer: Self-pay | Admitting: Internal Medicine

## 2015-02-09 ENCOUNTER — Ambulatory Visit: Payer: Medicare Other | Admitting: Internal Medicine

## 2015-02-13 DIAGNOSIS — C671 Malignant neoplasm of dome of bladder: Secondary | ICD-10-CM | POA: Diagnosis not present

## 2015-02-16 ENCOUNTER — Ambulatory Visit: Payer: Medicare Other | Admitting: Internal Medicine

## 2015-02-21 ENCOUNTER — Other Ambulatory Visit (INDEPENDENT_AMBULATORY_CARE_PROVIDER_SITE_OTHER): Payer: Medicare Other

## 2015-02-21 ENCOUNTER — Encounter: Payer: Self-pay | Admitting: Internal Medicine

## 2015-02-21 ENCOUNTER — Ambulatory Visit (INDEPENDENT_AMBULATORY_CARE_PROVIDER_SITE_OTHER): Payer: Medicare Other | Admitting: Internal Medicine

## 2015-02-21 VITALS — BP 148/80 | HR 91 | Temp 98.2°F | Wt 111.0 lb

## 2015-02-21 DIAGNOSIS — I5022 Chronic systolic (congestive) heart failure: Secondary | ICD-10-CM | POA: Diagnosis not present

## 2015-02-21 DIAGNOSIS — J449 Chronic obstructive pulmonary disease, unspecified: Secondary | ICD-10-CM

## 2015-02-21 DIAGNOSIS — R531 Weakness: Secondary | ICD-10-CM | POA: Diagnosis not present

## 2015-02-21 LAB — URINALYSIS, ROUTINE W REFLEX MICROSCOPIC
Bilirubin Urine: NEGATIVE
Nitrite: NEGATIVE
PH: 6.5 (ref 5.0–8.0)
SPECIFIC GRAVITY, URINE: 1.025 (ref 1.000–1.030)
TOTAL PROTEIN, URINE-UPE24: 100 — AB
URINE GLUCOSE: NEGATIVE
UROBILINOGEN UA: 0.2 (ref 0.0–1.0)

## 2015-02-21 LAB — BASIC METABOLIC PANEL
BUN: 20 mg/dL (ref 6–23)
CALCIUM: 9.7 mg/dL (ref 8.4–10.5)
CO2: 31 meq/L (ref 19–32)
Chloride: 100 mEq/L (ref 96–112)
Creatinine, Ser: 1.17 mg/dL (ref 0.40–1.20)
GFR: 47.29 mL/min — AB (ref >60.00)
GLUCOSE: 115 mg/dL — AB (ref 70–99)
Potassium: 4.7 mEq/L (ref 3.5–5.1)
SODIUM: 139 meq/L (ref 135–145)

## 2015-02-21 LAB — CBC WITH DIFFERENTIAL/PLATELET
BASOS ABS: 0 10*3/uL (ref 0.0–0.1)
Basophils Relative: 0.4 % (ref 0.0–3.0)
Eosinophils Absolute: 0.1 10*3/uL (ref 0.0–0.7)
Eosinophils Relative: 0.7 % (ref 0.0–5.0)
HCT: 38.5 % (ref 36.0–46.0)
Hemoglobin: 12.8 g/dL (ref 12.0–15.0)
LYMPHS ABS: 0.9 10*3/uL (ref 0.7–4.0)
Lymphocytes Relative: 10.1 % — ABNORMAL LOW (ref 12.0–46.0)
MCHC: 33.4 g/dL (ref 30.0–36.0)
MCV: 94.7 fl (ref 78.0–100.0)
MONO ABS: 0.7 10*3/uL (ref 0.1–1.0)
MONOS PCT: 7.8 % (ref 3.0–12.0)
NEUTROS ABS: 7.6 10*3/uL (ref 1.4–7.7)
NEUTROS PCT: 81 % — AB (ref 43.0–77.0)
PLATELETS: 441 10*3/uL — AB (ref 150.0–400.0)
RBC: 4.06 Mil/uL (ref 3.87–5.11)
RDW: 13.7 % (ref 11.5–15.5)
WBC: 9.4 10*3/uL (ref 4.0–10.5)

## 2015-02-21 LAB — HEPATIC FUNCTION PANEL
ALBUMIN: 3.8 g/dL (ref 3.5–5.2)
ALT: 13 U/L (ref 0–35)
AST: 18 U/L (ref 0–37)
Alkaline Phosphatase: 90 U/L (ref 39–117)
BILIRUBIN DIRECT: 0.1 mg/dL (ref 0.0–0.3)
TOTAL PROTEIN: 6.9 g/dL (ref 6.0–8.3)
Total Bilirubin: 0.4 mg/dL (ref 0.2–1.2)

## 2015-02-21 LAB — TROPONIN I: TNIDX: 0.01 ug/l (ref 0.00–0.06)

## 2015-02-21 LAB — BRAIN NATRIURETIC PEPTIDE: PRO B NATRI PEPTIDE: 127 pg/mL — AB (ref 0.0–100.0)

## 2015-02-21 MED ORDER — ALBUTEROL SULFATE HFA 108 (90 BASE) MCG/ACT IN AERS: 1.0000 | INHALATION_SPRAY | RESPIRATORY_TRACT | Status: DC | PRN

## 2015-02-21 MED ORDER — PREDNISONE 10 MG PO TABS: ORAL_TABLET | ORAL | Status: DC

## 2015-02-21 NOTE — Assessment & Plan Note (Signed)
Worse May need O2 Steroid taper

## 2015-02-21 NOTE — Progress Notes (Signed)
Subjective:  Patient ID: Amber Snow, female    DOB: Nov 20, 1932  Age: 81 y.o. MRN: [id number]  CC: No chief complaint on file.   HPI Amber Snow presents for not feeling well since bladder tumor procedure on 02/06/15. C/o fatigue, SOB, weakness. No chills. There is some upper abd bloating, no pain  Outpatient Prescriptions Prior to Visit  Medication Sig Dispense Refill  . acetaminophen (TYLENOL) 500 MG tablet Take 1,000 mg by mouth every 4 (four) hours as needed for moderate pain.    Marland Kitchen ALPRAZolam (XANAX) 0.25 MG tablet Take 0.5-1 tablets (0.125-0.25 mg total) by mouth 2 (two) times daily as needed for anxiety (shortness of breath). 60 tablet 3  . baclofen (LIORESAL) 10 MG tablet Take 10 mg by mouth 3 (three) times daily as needed for muscle spasms.     Marland Kitchen BIOTIN PO Take 1 capsule by mouth daily.    . budesonide-formoterol (SYMBICORT) 160-4.5 MCG/ACT inhaler Inhale 2 puffs into the lungs 2 (two) times daily. 1 Inhaler 11  . Cholecalciferol (VITAMIN D) 1000 UNITS capsule Take 1,000 Units by mouth daily.     . furosemide (LASIX) 20 MG tablet Take 1 tablet (20 mg total) by mouth daily as needed for edema. 30 tablet 5  . ibuprofen (ADVIL,MOTRIN) 200 MG tablet Take 600-800 mg by mouth every 6 (six) hours as needed for moderate pain.    Marland Kitchen losartan (COZAAR) 50 MG tablet TAKE 1 TABLET BY MOUTH AT BEDTIME 90 tablet 2  . nitrofurantoin, macrocrystal-monohydrate, (MACROBID) 100 MG capsule Take 1 capsule (100 mg total) by mouth at bedtime. 14 capsule 0  . predniSONE (DELTASONE) 5 MG tablet Take 5 mg by mouth daily.  5  . traMADol (ULTRAM) 50 MG tablet Take 1 tablet (50 mg total) by mouth every 6 (six) hours as needed. 20 tablet 0  . traZODone (DESYREL) 50 MG tablet TAKE 1 TABLET BY MOUTH EVERY DAY (Patient taking differently: TAKE 1 TABLET BY MOUTH EVERY DAY NIGHTLY.) 90 tablet 2  . zoledronic acid (RECLAST) 5 MG/100ML SOLN Inject 5 mg into the vein once. Every year    . albuterol (PROVENTIL  HFA;VENTOLIN HFA) 108 (90 BASE) MCG/ACT inhaler Inhale 2 puffs into the lungs every 6 (six) hours as needed. Shortness of breath (Patient taking differently: Inhale 2 puffs into the lungs every 6 (six) hours as needed for wheezing or shortness of breath. Shortness of breath) 8.5 g 5  . predniSONE (DELTASONE) 10 MG tablet 4 FOR 3 DAYS,3 FOR 4 DAYS, 2 FOR 4 DAYS, 1 FOR 6 DAYS, THEN GO BACK TO 5 MG AFTER MEALS 38 tablet 0   No facility-administered medications prior to visit.    ROS Review of Systems  Constitutional: Positive for fatigue. Negative for chills, activity change, appetite change and unexpected weight change.  HENT: Negative for congestion, mouth sores and sinus pressure.   Eyes: Negative for visual disturbance.  Respiratory: Positive for shortness of breath. Negative for cough, chest tightness and wheezing.   Cardiovascular: Negative for chest pain, palpitations and leg swelling.  Gastrointestinal: Negative for nausea, abdominal pain, constipation and abdominal distention.  Genitourinary: Negative for frequency, difficulty urinating and vaginal pain.  Musculoskeletal: Negative for back pain and gait problem.  Skin: Negative for pallor and rash.  Neurological: Positive for weakness. Negative for dizziness, tremors, numbness and headaches.  Psychiatric/Behavioral: Negative for confusion and sleep disturbance.    Objective:  BP 148/80 mmHg  Pulse 91  Temp(Src) 98.2 F (36.8 C) (Oral)  Wt 111 lb (50.349 kg)  SpO2 90%  BP Readings from Last 3 Encounters:  02/21/15 148/80  02/06/15 145/67  01/30/15 167/73    Wt Readings from Last 3 Encounters:  02/21/15 111 lb (50.349 kg)  02/06/15 112 lb 6.4 oz (50.984 kg)  01/30/15 112 lb 6.4 oz (50.984 kg)    Physical Exam  Constitutional: She appears well-developed. No distress.  HENT:  Head: Normocephalic.  Right Ear: External ear normal.  Left Ear: External ear normal.  Nose: Nose normal.  Mouth/Throat: Oropharynx is clear  and moist.  Eyes: Conjunctivae are normal. Pupils are equal, round, and reactive to light. Right eye exhibits no discharge. Left eye exhibits no discharge.  Neck: Normal range of motion. Neck supple. No JVD present. No tracheal deviation present. No thyromegaly present.  Cardiovascular: Normal rate, regular rhythm and normal heart sounds.   Pulmonary/Chest: No stridor. No respiratory distress. She has no wheezes.  Abdominal: Soft. Bowel sounds are normal. She exhibits no distension and no mass. There is no tenderness. There is no rebound and no guarding.  Musculoskeletal: She exhibits no edema or tenderness.  Lymphadenopathy:    She has no cervical adenopathy.  Neurological: She displays normal reflexes. No cranial nerve deficit. She exhibits normal muscle tone. Coordination normal.  Skin: No rash noted. No erythema.  Psychiatric: She has a normal mood and affect. Her behavior is normal. Judgment and thought content normal.  SOB w/activity  Lab Results  Component Value Date   WBC 14.2* 01/30/2015   HGB 14.1 01/30/2015   HCT 42.9 01/30/2015   PLT 379 01/30/2015   GLUCOSE 125* 01/30/2015   CHOL 221* 07/06/2013   TRIG 200.0* 07/06/2013   HDL 60.90 07/06/2013   LDLDIRECT 126.5 06/14/2012   LDLCALC 120* 07/06/2013   ALT 16 11/08/2014   AST 24 11/08/2014   NA 140 01/30/2015   K 5.6* 01/30/2015   CL 103 01/30/2015   CREATININE 0.89 01/30/2015   BUN 28* 01/30/2015   CO2 28 01/30/2015   TSH 1.21 11/08/2014   INR 1.0 09/04/2011   HGBA1C 5.8* 06/04/2012    Author: Festus Aloe, MD Service: Urology Author Type: Physician   Filed: 02/06/2015 9:41 AM Note Time: 02/06/2015 9:34 AM Status: Signed   Editor: Festus Aloe, MD (Physician)     Expand All Collapse All   Preoperative diagnosis: Malignant neoplasm of the bladder Postoperative diagnosis: Malignant neoplasm of the bladder overlapping sites  Procedure: TURBT greater than 5 cm Right retrograde pyelogram Right ureteral  stent placement Cystogram  Surgeon: Junious Silk  Anesthesia: Gen.  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  Findings: On exam under anesthesia the bulb was normal without mass or lesion, a urethral caruncle was present. The bladder was palpably normal.  On cystoscopy the urethra was normal, the right side of the trigone in the right ureteral orifice was surrounded by papillary tumor. the left ureteral orifice was clear but tumor was close to it.  There are multiple areas of papillary tumor which appeared superficial. A carpeted the left bladder wall several areas in the dome. An carpeted around the right ureteral orifice and up the right side. There are also 2 or 3 areas along the anterior bladder neck. Representative portions were biopsied from the right left and dome and the rest was ablated nor resected with the loop and the button.  Right retrograde pyelogram-this outlined a single ureter single collecting system unit without filling defect,  stricture or dilation.  Cystogram-this outlined a normal bladder contour without extravasation. The bladder was drained and scout imaging revealed no residual contrast in the pelvis. She was filled to about 280 mL.  Description of procedure: After consent was obtained patient brought to the operating room. After adequate anesthesia she was placed in lithotomy position and prepped and draped in the usual sterile fashion. The cystoscope was passed per urethra and the bladder inspected. I then used the cold cup biopsy forceps to biopsy the right side of the bladder and the dome. These biopsies were then fulgurated. With the loop in and ablated all of the tumor at the dome and the bladder neck. The been approached the right side and ablated and fulgurated most of this tumor. I passed the cold cup biopsy forceps again a biopsied another section on the right side and sent  with the right biopsies. This was then fulgurated and all the tumor was ablated. It was encroaching the right bladder neck as well. The left ureteral orifice was not involved and there was excellent clear efflux.  There was an area right superior that was fulgurated.  The right area was then approached. I ablated some of the tissue around the right ureteral orifice. I then went well medial into the cold cup rigid biopsy forceps and biopsied here. This biopsy as well as the last right biopsy were deep into the muscle but no apparent perforation. then fulgurated this area and came down inferior to it and while doing that the right ureteral orifice cut set into the ablation and could not be readily located. All visible tumor in the bladder had been eradicated but I suspect there are multiple other early areas. The efflux from the left was excellent and faint and not readily located from the right. Therefore used a 5 [place] open-ended catheter and a sensor wire to probe around and was able to locate the right ureteral orifice. This the 5 [place] open-ended catheter was advanced into the proximal ureter and the wire removed. Retrograde injection of contrast was performed confirming proper placement. The wire was coiled in the collecting system and the 5 [place] open-ended catheter removed. The wire was backloaded on the cystoscope and a 6 x 26 cm stent was advanced. There is good coil in the renal pelvis and a good coil in the bladder.   A 16 French Foley catheter was placed in the bladder was filled to about 280 mL. No extravasation of contrast was noted. The bladder was drained. The urine was clear. She was awakened and taken to cover him in stable condition.   Complications: None   Blood loss: Minimal   Specimens to pathology: #1 right bladder biopsy  #2 dome biopsy  #3 left biopsy   Drains: 16 French Foley, 6 x 26 and a meter right ureteral stent  Disposition: Patient stable to PACU           No results found.  Assessment & Plan:   Diagnoses and all orders for this visit:  Chronic systolic CHF (congestive heart failure) ([place]) -     Basic metabolic panel; Future -     CBC with Differential/Platelet; Future -     Brain natriuretic peptide; Future -     Urinalysis; Future -     Hepatic function panel; Future -     Troponin I; Future  COPD GOLD II  -     Basic metabolic panel; Future -     CBC with Differential/Platelet;  Future -     Brain natriuretic peptide; Future -     Urinalysis; Future -     Hepatic function panel; Future -     Troponin I; Future  Other orders -     predniSONE (DELTASONE) 10 MG tablet; 4 FOR 3 DAYS,3 FOR 4 DAYS, 2 FOR 4 DAYS, 1 FOR 6 DAYS, THEN GO BACK TO 5 MG AFTER MEALS -     albuterol (PROAIR HFA) 108 (90 BASE) MCG/ACT inhaler; Inhale 1-2 puffs into the lungs every 4 (four) hours as needed for wheezing or shortness of breath.  I have discontinued Ms. Junio's albuterol. I am also having her start on albuterol. Additionally, I am having her maintain her Vitamin D, BIOTIN PO, zoledronic acid, baclofen, budesonide-formoterol, ALPRAZolam, losartan, traZODone, predniSONE, furosemide, ibuprofen, acetaminophen, nitrofurantoin (macrocrystal-monohydrate), traMADol, and predniSONE.  Meds ordered this encounter  Medications  . predniSONE (DELTASONE) 10 MG tablet    Sig: 4 FOR 3 DAYS,3 FOR 4 DAYS, 2 FOR 4 DAYS, 1 FOR 6 DAYS, THEN GO BACK TO 5 MG AFTER MEALS    Dispense:  38 tablet    Refill:  0  . albuterol (PROAIR HFA) 108 (90 BASE) MCG/ACT inhaler    Sig: Inhale 1-2 puffs into the lungs every 4 (four) hours as needed for wheezing or shortness of breath.    Dispense:  1 Inhaler    Refill:  10     Follow-up: Return in about 2 weeks (around 03/07/2015) for a follow-up visit.  Walker Kehr, MD

## 2015-02-21 NOTE — Patient Instructions (Signed)
To ER if worse

## 2015-02-21 NOTE — Assessment & Plan Note (Signed)
Worse Labs, UA Steroid taper

## 2015-02-21 NOTE — Assessment & Plan Note (Signed)
UA, other labs Deltasone booster

## 2015-02-21 NOTE — Progress Notes (Signed)
Pre visit review using our clinic review tool, if applicable. No additional management support is needed unless otherwise documented below in the visit note. 

## 2015-02-21 NOTE — Assessment & Plan Note (Addendum)
Worse 10/16 Labs O2 sat w/activity

## 2015-02-21 NOTE — Progress Notes (Signed)
Resting O2 Sat and HR: 90%, HR 94 After  lap 1 of walking:  90%, HR 110          lap 2:  91%, HR 114          lap 3:  92%, HR 118

## 2015-02-23 ENCOUNTER — Telehealth: Payer: Self-pay | Admitting: Internal Medicine

## 2015-02-23 NOTE — Telephone Encounter (Signed)
Pt request phone call concern about antibiotic that Dr. Camila Li was going to send in for urinary problem  Call back # [phone]

## 2015-02-27 ENCOUNTER — Ambulatory Visit: Payer: Medicare Other | Admitting: Internal Medicine

## 2015-02-27 ENCOUNTER — Telehealth: Payer: Self-pay | Admitting: Internal Medicine

## 2015-02-27 NOTE — Telephone Encounter (Signed)
OV in 3 mo if doing well Thx

## 2015-02-27 NOTE — Telephone Encounter (Signed)
OK - do not take Macrobid Thx

## 2015-02-27 NOTE — Telephone Encounter (Signed)
What is the problem? Thx 

## 2015-02-27 NOTE — Telephone Encounter (Signed)
Patient has appointment on the 14th.  States she is doing better and would like to know if she needs to keep appointment.  If she does not need to keep appointment she would like to know when she needs to make her next appointment.

## 2015-02-27 NOTE — Telephone Encounter (Signed)
Pt states she had recently completed Macrobid ( prescribed by Dr. Junious Silk)  2 days prior to her 02/21/15 UA.  She doesn't think she needs that same Rx again as her symptoms are only 10% better after completing 14 day supply of Macrobid. She is coming in this afternoon with Mr. Mesenbrink. She is scheduled to see Dr. Junious Silk this Friday. Please advise. Does she need a different abx?

## 2015-02-27 NOTE — Telephone Encounter (Signed)
Pt informed today while she was in office with her husband. She states she is seeing urology this Friday.

## 2015-02-28 NOTE — Telephone Encounter (Signed)
Notified pt with md response. She states she will call bck the first of next week if she doesn't need to see md. Have appt to see neurologist on Friday...Amber Snow

## 2015-03-02 DIAGNOSIS — C674 Malignant neoplasm of posterior wall of bladder: Secondary | ICD-10-CM | POA: Diagnosis not present

## 2015-03-02 DIAGNOSIS — N390 Urinary tract infection, site not specified: Secondary | ICD-10-CM | POA: Diagnosis not present

## 2015-03-08 DIAGNOSIS — C678 Malignant neoplasm of overlapping sites of bladder: Secondary | ICD-10-CM | POA: Diagnosis not present

## 2015-03-12 ENCOUNTER — Ambulatory Visit: Payer: Medicare Other | Admitting: Internal Medicine

## 2015-03-12 ENCOUNTER — Telehealth: Payer: Self-pay | Admitting: Internal Medicine

## 2015-03-12 NOTE — Telephone Encounter (Signed)
Needs to go to ER

## 2015-03-12 NOTE — Telephone Encounter (Signed)
Patient says that she is very short of breath. Even if she is sitting doing nothing, she is struggling.  Patient has been on prednisone and it doesn't help.  Taking Prednisone 5mg  daily. No cough.  No chest tightness.  Severe fatigue.  Rescue inhaler is not helping.    Allergies  Allergen Reactions  . Barbiturates Rash  . Penicillins Rash    Has patient had a PCN reaction causing immediate rash, facial/tongue/throat swelling, SOB or lightheadedness with hypotension: Yes  Has patient had a PCN reaction causing severe rash involving mucus membranes or skin necrosis: Yes. Itchy Has patient had a PCN reaction that required hospitalization No Has patient had a PCN reaction occurring within the last 10 years: No If all of the above answers are "NO", then may proceed with Cephalosporin use.   . Shrimp [Shellfish Allergy] Swelling  . Anoro Ellipta [Umeclidinium-Vilanterol]     cough  . Aspirin Nausea Only  . Beta Adrenergic Blockers     PT STATES TOLD TO AVOID DUE TO COUGH REACTION TO CARDIZEM  . Bisoprolol     SOB and dizzy  . Calcium Channel Blockers     PER PT TOLD TO AVOID DUE TO COUGH REACTION TO CARDIZEM  . Celecoxib Nausea Only  . Ciprofloxacin Other (See Comments)    Red streaks on arm when given IV  . Codeine Other (See Comments)    REACTION: excessive sleep  . Diltiazem Hcl Other (See Comments)    REACTION: cough  . Fluvastatin Sodium   . Ibandronate Sodium Other (See Comments)    REACTION: HAIR LOSS/NAIL CHANGES  . Oxycodone-Acetaminophen Nausea Only  . Plavix [Clopidogrel Bisulfate] Itching  . Rosuvastatin   . Simvastatin

## 2015-03-12 NOTE — Telephone Encounter (Signed)
Called pt and received VM--LMTCB x1 

## 2015-03-12 NOTE — Telephone Encounter (Signed)
Called and left detailed message for pt to go to ED per Dr. Melvyn Novas.

## 2015-03-13 ENCOUNTER — Encounter: Payer: Self-pay | Admitting: Internal Medicine

## 2015-03-13 ENCOUNTER — Ambulatory Visit (INDEPENDENT_AMBULATORY_CARE_PROVIDER_SITE_OTHER): Payer: Medicare Other | Admitting: Internal Medicine

## 2015-03-13 ENCOUNTER — Ambulatory Visit (INDEPENDENT_AMBULATORY_CARE_PROVIDER_SITE_OTHER)
Admission: RE | Admit: 2015-03-13 | Discharge: 2015-03-13 | Disposition: A | Discharge status: Home / Self Care | Payer: Medicare Other | Source: Ambulatory Visit | Attending: Internal Medicine | Admitting: Internal Medicine

## 2015-03-13 VITALS — BP 130/74 | HR 134 | Ht 63.0 in | Wt 113.0 lb

## 2015-03-13 DIAGNOSIS — J449 Chronic obstructive pulmonary disease, unspecified: Secondary | ICD-10-CM | POA: Diagnosis not present

## 2015-03-13 DIAGNOSIS — R0602 Shortness of breath: Secondary | ICD-10-CM | POA: Diagnosis not present

## 2015-03-13 MED ORDER — PREDNISONE 10 MG PO TABS: ORAL_TABLET | ORAL | Status: DC

## 2015-03-13 MED ORDER — TIOTROPIUM BROMIDE-OLODATEROL 2.5-2.5 MCG/ACT IN AERS: 2.0000 | INHALATION_SPRAY | Freq: Every day | RESPIRATORY_TRACT | Status: DC

## 2015-03-13 NOTE — Progress Notes (Signed)
Subjective:    Patient ID: Amber Snow, female    DOB: December 16, 1932  MRN: [id number]   Brief patient profile:  [id number] with GOLD II copd by pfts 07/2009 quit smoking June 2013  due to progressive doe x 5 years worse since quit referred to pulmonary clinic 06/17/2012 by Dr Ignacia Palma p admit :  Admit date: 06/01/2012  Discharge date: 06/05/2012  Discharge Diagnoses:  Active Problems:  HYPERLIPIDEMIA  LEFT BUNDLE BRANCH BLOCK  COPD  LOW BACK PAIN, CHRONIC  LV dysfunction  Lung nodule > outpt pet rec COPD exacerbation  Nonischemic cardiomyopathy   COPD exacerbation  -CT angiogram of the chest was negative for pulmonary embolus  -Patient was started on Solu-Medrol, Levaquin, and bronchodilators  -Wheezing has improved with Solu-Medrol  -changed to by mouth prednisone without decompensation  -Continue every 6 hour albuterol-And when necessary for shortness of breath  -Continue levofloxacin--adjust Levaquin dose for renal function--final dose to be given on 06/06/2012 to complete 5 days  -I believe that her continuing dyspnea on exertion is a combination of her COPD exacerbation, cardiomyopathy, and deconditioning.  -The patient was subsequently weaned off of oxygen supplementation. Her oxygen saturation was 90-94% with activity on room air.  -She will go home with a prednisone wean starting with 50 mg on day #1 and decreasing by 10 mg daily    06/17/2012 1st pulmonary eval/ Torie Priebe s/p hosp in January 2014 cc doe x 50 ft variable improvement with saba =proaire rec Symbicort 2 every 12 hours Spiriva once daily  Only use your albuterol (proaire) as a rescue medication Work on inhaler technique:    03/14/2013 f/u ov/Alquan Morrish re: copd GOLD II/ SPN LUL Chief Complaint  Patient presents with  . Followup with cxr    Pt states her breathing seems to be doing well. She denies any new co's today. Pt states she uses rescue inhaler 2-3 times per month on average.   freq cough, choking sensation  esp in am assoc with dry mouth and feeling congested but no excess mucus or noct exac- overall much better though and can really tell when misses dose of symbicort Not really limited from desired activities due to sob on smb and spiriva rec Stop spiriva for now to see what difference this makes    07/28/2013 f/u ov/Netta Fodge re: COPD GOLD II on maint symb 160 2bid/ off spiriva as made no difference in doe Chief Complaint  Patient presents with  . Follow-up    Pt reports having increased SOB today. She relates this to her BP med Bisoprolol. She stopped med temporarily and breathing improved, but is worse since she started back on med.    off bisoprolol breathing and energy much better  Off bisoprolol needed saba  maybe once a week, back on it using saba once daily rec Stop bisoprolol No change resp meds    11/11/2014 f/u ov/Shaheim Mahar re: GOLD II copd/ f/u SPN on symbicort 160 2bid/ rare need for saba  Chief Complaint  Patient presents with  . Follow-up    Breathing is doing well still using rescue inhaler prn.   Not limited by breathing from desired activities  But not very active - could not name any activity she wants to do where breathing stops/slows her rec F/u prn    03/13/2015 acute extended ov/Qamar Aughenbaugh re: sob/ GOLD II copd  Chief Complaint  Patient presents with  . Acute Visit    Pt c/o increased DOE with any exertion for the past month.  She was out of breathing walking from lobby to first exam room today. She has minimal cough with clear sputum.  She is using rescue inhaler 2 x per day on average.     Gradually worse since bladder  surgery  02/06/15 esp the last 2 weeks/ comfortable lying flat / maint on macrobid   No obvious day to day  variabilty or assoc excess or purulent sputum   cp or chest tightness, subjective wheeze overt sinus or hb symptoms. No unusual exp hx or h/o childhood pna/ asthma or knowledge of premature birth.   Sleeping ok without nocturnal  or early am  exacerbation  of respiratory  c/o's or need for noct saba. Also denies any obvious fluctuation of symptoms with weather or environmental changes or other aggravating or alleviating factors except as outlined above  Current Medications, Allergies, Past Medical History, Past Surgical History, Family History, and Social History were reviewed in [employer] record.  ROS  The following are not active complaints unless bolded sore throat, dysphagia, dental problems, itching, sneezing,  nasal congestion or excess/ purulent secretions, ear ache,   fever, chills, sweats, unintended wt loss, pleuritic or exertional cp, hemoptysis,  orthopnea pnd or leg swelling, presyncope, palpitations, heartburn, abdominal pain, anorexia, nausea, vomiting, diarrhea  or change in bowel or urinary habits, change in stools or urine, dysuria,hematuria,  rash, arthralgias, visual complaints, headache, numbness weakness or ataxia or problems with walking or coordination,  change in mood/affect or memory.            Objective:   Physical Exam    thin anxious amb wf nad  07/29/2012  105  >102 09/24/2012 > 10/26/2012  99 > 12/10/2012 99 > 104 03/14/2013 > 06/20/2013 107 > 109 07/28/2013 > 11/09/2013  109 > 11/10/2014   113 > 03/13/2015  113    Vital signs reviewed - note tachycardia on arrival but not on exam   HEENT mild turbinate edema.  Oropharynx no thrush or excess pnd or cobblestoning.  No JVD or cervical adenopathy. Mild accessory muscle hypertrophy. Trachea midline, nl thryroid. Chest was hyperinflated by percussion with diminished breath sounds and moderate increased exp time without wheeze. Hoover sign positive at mid inspiration. Regular rate and rhythm without murmur gallop or rub or increase P2 or edema.  Abd: no hsm, nl excursion. Ext warm without cyanosis or clubbing.          CXR PA and Lateral:   03/13/2015 :    I personally reviewed images and agree with radiology impression as follows:    COPD. Chronic interstitial prominence. There is no alveolar pneumonia, pulmonary edema, nor other acute cardiopulmonary abnormality.             Assessment & Plan:

## 2015-03-13 NOTE — Telephone Encounter (Signed)
Can[t treat this over the phone and note she carries dx of chf also so needs to be eval by primary care/ here or UC to sort out - ok to add on

## 2015-03-13 NOTE — Telephone Encounter (Signed)
Pt is aware of MW's reccommendation. She has been added on to [facility] scheduled today at 1:30pm. Nothing further was needed.

## 2015-03-13 NOTE — Progress Notes (Signed)
Quick Note:  Spoke with pt and notified of results per Dr. Wert. Pt verbalized understanding and denied any questions.  ______ 

## 2015-03-13 NOTE — Telephone Encounter (Signed)
Patient did not go to the ED last night as advised and would like a nurse to call her as soon as possible at [phone].

## 2015-03-13 NOTE — Patient Instructions (Addendum)
Try prilosec otc 20mg   Take 30-60 min before first meal of the day and Pepcid ac (famotidine) 20 mg one @  bedtime    Stop symbicort and start stiolto  2 pffs each am   Prednisone 10 mg take  4 each am x 2 days,   2 each am x 2 days,  1 each am x 2 days and stop   Please remember to go to the  x-ray department downstairs for your tests - we will call you with the results when they are available.  Please schedule a follow up office visit in 4 weeks, sooner if needed

## 2015-03-13 NOTE — Telephone Encounter (Signed)
Spoke with pt, states that she did not go to the ED because she did not know if she was "bad enough" to warrant an ED trip.  Pt is still SOB with any exertion but states as long as she lays still she is fine.  Also notes a sometimes prod cough with clear mucus.  Denies chest tightness/pain, sinus congestion.  Pt is wanting further recs from [place].    MW please advise.  Thanks.

## 2015-03-16 ENCOUNTER — Telehealth: Payer: Self-pay | Admitting: Internal Medicine

## 2015-03-16 MED ORDER — TIOTROPIUM BROMIDE-OLODATEROL 2.5-2.5 MCG/ACT IN AERS: 2.0000 | INHALATION_SPRAY | Freq: Every day | RESPIRATORY_TRACT | Status: DC

## 2015-03-16 NOTE — Telephone Encounter (Signed)
Patient says that for the first couple of days that she was using the [facility], it was working great and she really likes it.  She said that now, she cannot twist the inhaler in the direction that it says to twist it.  She said that she twisted it the other direction and it let out a very small amount of a mist, so she thinks that the current sample she has is broken and she is asking to try a new sample of the Stiolto since this one is not working properly.    Sample left at front desk for patient to pick up. Patient notified. Nothing further needed.  Closing encounter

## 2015-03-18 NOTE — Assessment & Plan Note (Signed)
-   PFT's 07/31/09 FEV1  1.36 (74%) ratio 45 and no better p B2,  DLCO 66%  - quit smoking June 2013 - PFT's 07/29/2012 FEV1 1.10 (63%) ratio 46 and no change p B2 and DLCO 57%  - 10/26/2012  Walked RA x 3 laps @ 185 ft each stopped due to end of study, no desats - Trial off spiriva 03/14/2013 > rechallenged 06/20/12 due to worse doe > d/cd around 06/26/13 and no worse off it - 07/28/2013 p extensive coaching HFA effectiveness =    90%    ? Flare of copd p ET for bladder surgery  DDX of  difficult airways management all start with A and  include Adherence, Ace Inhibitors, Acid Reflux, Active Sinus Disease, Alpha 1 Antitripsin deficiency, Anxiety masquerading as Airways dz,  ABPA,  allergy(esp in young), Aspiration (esp in elderly), Adverse effects of meds,  Active smokers, A bunch of PE's (a small clot burden can't cause this syndrome unless there is already severe underlying pulm or vascular dz with poor reserve) plus two Bs  = Bronchiectasis and Beta blocker use..and one C= CHF  Adherence is always the initial "prime suspect" and is a multilayered concern that requires a "trust but verify" approach in every patient - starting with knowing how to use medications, especially inhalers, correctly, keeping up with refills and understanding the fundamental difference between maintenance and prns vs those medications only taken for a very short course and then stopped and not refilled.  - The proper method of use, as well as anticipated side effects, of a metered-dose inhaler are discussed and demonstrated to the patient. Improved effectiveness after extensive coaching during this visit to a level of approximately  90% with respimat so try stiolto 2 pffs each am   ? Acid (or non-acid) GERD > always difficult to exclude as up to 75% of pts in some series report no assoc GI/ Heartburn symptoms> rec max (24h)  acid suppression and diet restrictions/ reviewed and instructions given in writing.   ? Allergy/asthmatic  component > doubt but worth a short course pred only = Prednisone 10 mg take  4 each am x 2 days,   2 each am x 2 days,  1 each am x 2 days and stop   I had an extended discussion with the patient reviewing all relevant studies completed to date and  lasting 15 to 20 minutes of a 25 minute visit    Each maintenance medication was reviewed in detail including most importantly the difference between maintenance and prns and under what circumstances the prns are to be triggered using an action plan format that is not reflected in the computer generated alphabetically organized AVS.    Please see instructions for details which were reviewed in writing and the patient given a copy highlighting the part that I personally wrote and discussed at today's ov.

## 2015-03-26 DIAGNOSIS — C674 Malignant neoplasm of posterior wall of bladder: Secondary | ICD-10-CM | POA: Diagnosis not present

## 2015-03-30 DIAGNOSIS — Z1231 Encounter for screening mammogram for malignant neoplasm of breast: Secondary | ICD-10-CM | POA: Diagnosis not present

## 2015-03-30 DIAGNOSIS — M8580 Other specified disorders of bone density and structure, unspecified site: Secondary | ICD-10-CM | POA: Diagnosis not present

## 2015-04-02 DIAGNOSIS — Z5111 Encounter for antineoplastic chemotherapy: Secondary | ICD-10-CM | POA: Diagnosis not present

## 2015-04-02 DIAGNOSIS — C674 Malignant neoplasm of posterior wall of bladder: Secondary | ICD-10-CM | POA: Diagnosis not present

## 2015-04-02 DIAGNOSIS — C678 Malignant neoplasm of overlapping sites of bladder: Secondary | ICD-10-CM | POA: Diagnosis not present

## 2015-04-09 DIAGNOSIS — C674 Malignant neoplasm of posterior wall of bladder: Secondary | ICD-10-CM | POA: Diagnosis not present

## 2015-04-09 DIAGNOSIS — C678 Malignant neoplasm of overlapping sites of bladder: Secondary | ICD-10-CM | POA: Diagnosis not present

## 2015-04-09 DIAGNOSIS — Z5111 Encounter for antineoplastic chemotherapy: Secondary | ICD-10-CM | POA: Diagnosis not present

## 2015-04-10 ENCOUNTER — Encounter: Payer: Self-pay | Admitting: Internal Medicine

## 2015-04-10 ENCOUNTER — Ambulatory Visit (INDEPENDENT_AMBULATORY_CARE_PROVIDER_SITE_OTHER): Payer: Medicare Other | Admitting: Internal Medicine

## 2015-04-10 VITALS — BP 128/62 | HR 84 | Ht 63.0 in | Wt 113.4 lb

## 2015-04-10 DIAGNOSIS — J449 Chronic obstructive pulmonary disease, unspecified: Secondary | ICD-10-CM | POA: Diagnosis not present

## 2015-04-10 MED ORDER — GLYCOPYRROLATE-FORMOTEROL 9-4.8 MCG/ACT IN AERO: 2.0000 | INHALATION_SPRAY | Freq: Two times a day (BID) | RESPIRATORY_TRACT | Status: DC

## 2015-04-10 NOTE — Progress Notes (Signed)
Subjective:    Patient ID: Amber Snow, female    DOB: [birth year]  MRN: [id number]   Brief patient profile:  51  yowf with GOLD II copd by pfts 07/2009 quit smoking June 2013  due to progressive doe x 5 years worse since quit referred to pulmonary clinic 06/17/2012 by Dr Ignacia Palma p admit :  Admit date: 06/01/2012  Discharge date: 06/05/2012  Discharge Diagnoses:  Active Problems:  HYPERLIPIDEMIA  LEFT BUNDLE BRANCH BLOCK  COPD  LOW BACK PAIN, CHRONIC  LV dysfunction  Lung nodule > outpt pet rec COPD exacerbation  Nonischemic cardiomyopathy   COPD exacerbation  -CT angiogram of the chest was negative for pulmonary embolus  -Patient was started on Solu-Medrol, Levaquin, and bronchodilators  -Wheezing has improved with Solu-Medrol  -changed to by mouth prednisone without decompensation  -Continue every 6 hour albuterol-And when necessary for shortness of breath  -Continue levofloxacin--adjust Levaquin dose for renal function--final dose to be given on 06/06/2012 to complete 5 days  -I believe that her continuing dyspnea on exertion is a combination of her COPD exacerbation, cardiomyopathy, and deconditioning.  -The patient was subsequently weaned off of oxygen supplementation. Her oxygen saturation was 90-94% with activity on room air.  -She will go home with a prednisone wean starting with 50 mg on day #1 and decreasing by 10 mg daily    06/17/2012 1st pulmonary eval/ Hailei Besser s/p hosp in January 2014 cc doe x 50 ft variable improvement with saba =proaire rec Symbicort 2 every 12 hours Spiriva once daily  Only use your albuterol (proaire) as a rescue medication Work on inhaler technique:    03/14/2013 f/u ov/Wyolene Weimann re: copd GOLD II/ SPN LUL Chief Complaint  Patient presents with  . Followup with cxr    Pt states her breathing seems to be doing well. She denies any new co's today. Pt states she uses rescue inhaler 2-3 times per month on average.   freq cough, choking sensation  esp in am assoc with dry mouth and feeling congested but no excess mucus or noct exac- overall much better though and can really tell when misses dose of symbicort Not really limited from desired activities due to sob on smb and spiriva rec Stop spiriva for now to see what difference this makes    03/13/2015 acute extended ov/Meosha Castanon re: sob/ GOLD II copd  Chief Complaint  Patient presents with  . Acute Visit    Pt c/o increased DOE with any exertion for the past month. She was out of breathing walking from lobby to first exam room today. She has minimal cough with clear sputum.  She is using rescue inhaler 2 x per day on average.   Gradually worse since bladder  surgery  02/06/15 esp the last 2 weeks/ comfortable lying flat / maint on macrobid  rec Try prilosec otc 20mg   Take 30-60 min before first meal of the day and Pepcid ac (famotidine) 20 mg one @  bedtime   Stop symbicort and start stiolto  2 pffs each am  Prednisone 10 mg take  4 each am x 2 days,   2 each am x 2 days,  1 each am x 2 days and stop     04/10/2015  f/u ov/Junaid Wurzer re: GOLD II/ maint rx symb 160 2bid and alb at least bid Chief Complaint  Patient presents with  . Follow-up    Pt c/o continued cough. Pt states that she tried [employer] but it did not last and  she was having to use albuterol HFA nightly with improvement in symptoms. She has gone back to using Symbicort twice daily and this has seemed to help more. She does not need the albuterol HFA as much.   MMRC1 = can walk nl pace, flat grade, can't hurry or go uphills or steps s sob  And interested in any additional med that will help her do her housework/ steps    No obvious day to day  variabilty or assoc excess or purulent sputum   cp or chest tightness, subjective wheeze overt sinus or hb symptoms. No unusual exp hx or h/o childhood pna/ asthma or knowledge of premature birth.   Sleeping ok without nocturnal  or early am exacerbation  of respiratory  c/o's or need for  noct saba. Also denies any obvious fluctuation of symptoms with weather or environmental changes or other aggravating or alleviating factors except as outlined above  Current Medications, Allergies, Past Medical History, Past Surgical History, Family History, and Social History were reviewed in [employer] record.  ROS  The following are not active complaints unless bolded sore throat, dysphagia, dental problems, itching, sneezing,  nasal congestion or excess/ purulent secretions, ear ache,   fever, chills, sweats, unintended wt loss, pleuritic or exertional cp, hemoptysis,  orthopnea pnd or leg swelling, presyncope, palpitations, heartburn, abdominal pain, anorexia, nausea, vomiting, diarrhea  or change in bowel or urinary habits, change in stools or urine, dysuria,hematuria,  rash, arthralgias, visual complaints, headache, numbness weakness or ataxia or problems with walking or coordination,  change in mood/affect or memory.            Objective:   Physical Exam    thin anxious amb wf nad  07/29/2012  105  >102 09/24/2012 > 10/26/2012  99 > 12/10/2012 99 > 104 03/14/2013 > 06/20/2013 107 > 109 07/28/2013 > 11/09/2013  109 > 11/10/2014   113 > 03/13/2015  113 > 04/10/2015   113    Vital signs reviewed    HEENT mild turbinate edema.  Oropharynx no thrush or excess pnd or cobblestoning.  No JVD or cervical adenopathy. Mild accessory muscle hypertrophy. Trachea midline, nl thryroid. Chest was hyperinflated by percussion with diminished breath sounds and moderate increased exp time without wheeze. Hoover sign positive at mid inspiration. Regular rate and rhythm without murmur gallop or rub or increase P2 or edema.  Abd: no hsm, nl excursion. Ext warm without cyanosis or clubbing.          CXR PA and Lateral:   03/13/2015 :    I personally reviewed images and agree with radiology impression as follows:   COPD. Chronic interstitial prominence. There is no alveolar pneumonia,  pulmonary edema, nor other acute cardiopulmonary abnormality.             Assessment & Plan:

## 2015-04-10 NOTE — Patient Instructions (Addendum)
Stop symbicort >>  BEVESPI  Take 2 puffs first thing in am and then another 2 puffs about 12 hours later.   Work on maintaining perfect inhaler technique:  relax and gently blow all the way out then take a nice smooth deep breath back in, triggering the inhaler at same time you start breathing in.  Hold for up to 5 seconds if you can. Blow out thru nose. Rinse and gargle with water when done     Please schedule a follow up visit in 3 months but call sooner if needed

## 2015-04-11 ENCOUNTER — Encounter: Payer: Self-pay | Admitting: Internal Medicine

## 2015-04-11 NOTE — Assessment & Plan Note (Addendum)
-   PFT's 07/31/09 FEV1  1.36 (74%) ratio 45 and no better p B2,  DLCO 66%  - quit smoking June 2013 - PFT's 07/29/2012 FEV1 1.10 (63%) ratio 46 and no change p B2 and DLCO 57%  - 10/26/2012  Walked RA x 3 laps @ 185 ft each stopped due to end of study, no desats - Trial off spiriva 03/14/2013 > rechallenged 06/20/12 due to worse doe > d/cd around 06/26/13 and no worse off it  - 03/13/2015 trained on respimat > 90% effecitve > changed to stiolto 2 pffs each am > preferred symbicort so 04/10/2015 rec bevespi trial - 04/10/2015  Walked RA x 3 laps @ 185 ft each stopped due to  End of study, nl pace, no sob or desat/ did c/o leg fatigue bilaterally    - 04/10/2015  extensive coaching HFA effectiveness =    75%   Not clear at all that she will do any better on a LABA/LAMA combination  as she did not respond well to K[employer]  but may well be the reason she likes the Symbicort so much is  Effect of formoterol  not the budesonide component. The test this hypothesis I recommended a trial of Bevespi  2 puffs every 12 hours with a one-month  Free sample.    I had an extended discussion with the patient reviewing all relevant studies completed to date and  lasting 15 to 20 minutes of a 25 minute visit    Each maintenance medication was reviewed in detail including most importantly the difference between maintenance and prns and under what circumstances the prns are to be triggered using an action plan format that is not reflected in the computer generated alphabetically organized AVS.    Please see instructions for details which were reviewed in writing and the patient given a copy highlighting the part that I personally wrote and discussed at today's ov.

## 2015-04-16 DIAGNOSIS — Z5111 Encounter for antineoplastic chemotherapy: Secondary | ICD-10-CM | POA: Diagnosis not present

## 2015-04-16 DIAGNOSIS — C674 Malignant neoplasm of posterior wall of bladder: Secondary | ICD-10-CM | POA: Diagnosis not present

## 2015-04-20 ENCOUNTER — Other Ambulatory Visit: Payer: Self-pay | Admitting: *Deleted

## 2015-04-20 MED ORDER — LOSARTAN POTASSIUM 50 MG PO TABS: ORAL_TABLET | ORAL | Status: DC

## 2015-04-24 DIAGNOSIS — C671 Malignant neoplasm of dome of bladder: Secondary | ICD-10-CM | POA: Diagnosis not present

## 2015-04-24 DIAGNOSIS — Z5111 Encounter for antineoplastic chemotherapy: Secondary | ICD-10-CM | POA: Diagnosis not present

## 2015-04-24 DIAGNOSIS — C674 Malignant neoplasm of posterior wall of bladder: Secondary | ICD-10-CM | POA: Diagnosis not present

## 2015-04-25 ENCOUNTER — Other Ambulatory Visit: Payer: Self-pay

## 2015-04-25 NOTE — Telephone Encounter (Signed)
Approved      Disp Refills Start End    losartan (COZAAR) 50 MG tablet 90 tablet 2 04/20/2015     Sig:  TAKE 1 TABLET BY MOUTH AT BEDTIME    Class:  Normal    DAW:  No    Authorizing Provider:  Peter M Martinique, MD    Ordering User:  Freada Bergeron, CMA

## 2015-05-01 DIAGNOSIS — Z5111 Encounter for antineoplastic chemotherapy: Secondary | ICD-10-CM | POA: Diagnosis not present

## 2015-05-01 DIAGNOSIS — C674 Malignant neoplasm of posterior wall of bladder: Secondary | ICD-10-CM | POA: Diagnosis not present

## 2015-05-10 DIAGNOSIS — M542 Cervicalgia: Secondary | ICD-10-CM | POA: Diagnosis not present

## 2015-05-10 DIAGNOSIS — M791 Myalgia: Secondary | ICD-10-CM | POA: Diagnosis not present

## 2015-05-10 DIAGNOSIS — M47812 Spondylosis without myelopathy or radiculopathy, cervical region: Secondary | ICD-10-CM | POA: Diagnosis not present

## 2015-06-12 ENCOUNTER — Encounter: Payer: Self-pay | Admitting: Internal Medicine

## 2015-06-12 ENCOUNTER — Ambulatory Visit (INDEPENDENT_AMBULATORY_CARE_PROVIDER_SITE_OTHER): Payer: Medicare Other | Admitting: Internal Medicine

## 2015-06-12 VITALS — BP 148/78 | HR 93 | Wt 115.0 lb

## 2015-06-12 DIAGNOSIS — I5022 Chronic systolic (congestive) heart failure: Secondary | ICD-10-CM

## 2015-06-12 DIAGNOSIS — R0600 Dyspnea, unspecified: Secondary | ICD-10-CM

## 2015-06-12 DIAGNOSIS — E559 Vitamin D deficiency, unspecified: Secondary | ICD-10-CM | POA: Diagnosis not present

## 2015-06-12 DIAGNOSIS — J449 Chronic obstructive pulmonary disease, unspecified: Secondary | ICD-10-CM

## 2015-06-12 MED ORDER — PREDNISOLONE 5 MG PO TABS: 5.0000 mg | ORAL_TABLET | Freq: Every day | ORAL | Status: DC

## 2015-06-12 NOTE — Assessment & Plan Note (Addendum)
On Prednisone 5 mg/d - doing fair  Potential benefits of a long term steroid  use as well as potential risks  and complications were explained to the patient and were aknowledged. Breath w/diaphragm

## 2015-06-12 NOTE — Progress Notes (Signed)
Subjective:  Patient ID: Amber Snow, female    DOB: 01-Mar-1933  Age: 82 y.o. MRN: [id number]  CC: No chief complaint on file.   HPI Amber Snow presents for anxiety, COPD, HTN, SOB f/u  Outpatient Prescriptions Prior to Visit  Medication Sig Dispense Refill  . acetaminophen (TYLENOL) 500 MG tablet Take 1,000 mg by mouth every 4 (four) hours as needed for moderate pain.    Marland Kitchen albuterol (PROAIR HFA) 108 (90 BASE) MCG/ACT inhaler Inhale 1-2 puffs into the lungs every 4 (four) hours as needed for wheezing or shortness of breath. 1 Inhaler 10  . ALPRAZolam (XANAX) 0.25 MG tablet Take 0.5-1 tablets (0.125-0.25 mg total) by mouth 2 (two) times daily as needed for anxiety (shortness of breath). 60 tablet 3  . baclofen (LIORESAL) 10 MG tablet Take 10 mg by mouth 3 (three) times daily as needed for muscle spasms.     Marland Kitchen BIOTIN PO Take 1 capsule by mouth daily.    . Cholecalciferol (VITAMIN D) 1000 UNITS capsule Take 1,000 Units by mouth daily.     . furosemide (LASIX) 20 MG tablet Take 1 tablet (20 mg total) by mouth daily as needed for edema. 30 tablet 5  . Glycopyrrolate-Formoterol (BEVESPI AEROSPHERE) 9-4.8 MCG/ACT AERO Inhale 2 puffs into the lungs 2 (two) times daily. 1 Inhaler 11  . ibuprofen (ADVIL,MOTRIN) 200 MG tablet Take 600-800 mg by mouth every 6 (six) hours as needed for moderate pain.    Marland Kitchen losartan (COZAAR) 50 MG tablet TAKE 1 TABLET BY MOUTH AT BEDTIME 90 tablet 2  . predniSONE (DELTASONE) 10 MG tablet Take  4 each am x 2 days,   2 each am x 2 days,  1 each am x 2 days and stop 14 tablet 0  . traZODone (DESYREL) 50 MG tablet Take 1 tablet by mouth at bedtime.    . zoledronic acid (RECLAST) 5 MG/100ML SOLN Inject 5 mg into the vein once. Every year     No facility-administered medications prior to visit.    ROS Review of Systems  Constitutional: Negative for chills, activity change, appetite change, fatigue and unexpected weight change.  HENT: Negative for congestion,  mouth sores and sinus pressure.   Eyes: Negative for visual disturbance.  Respiratory: Positive for shortness of breath and wheezing. Negative for cough and chest tightness.   Cardiovascular: Negative for chest pain.  Gastrointestinal: Negative for nausea and abdominal pain.  Genitourinary: Negative for frequency, difficulty urinating and vaginal pain.  Musculoskeletal: Negative for back pain, arthralgias and gait problem.  Skin: Negative for pallor and rash.  Neurological: Negative for dizziness, tremors, weakness, numbness and headaches.  Psychiatric/Behavioral: Negative for confusion, sleep disturbance and dysphoric mood.    Objective:  BP 148/78 mmHg  Pulse 93  Wt 115 lb (52.164 kg)  SpO2 94%  BP Readings from Last 3 Encounters:  06/12/15 148/78  04/10/15 128/62  03/13/15 130/74    Wt Readings from Last 3 Encounters:  06/12/15 115 lb (52.164 kg)  04/10/15 113 lb 6.4 oz (51.438 kg)  03/13/15 113 lb (51.256 kg)    Physical Exam  Constitutional: She appears well-developed. No distress.  HENT:  Head: Normocephalic.  Right Ear: External ear normal.  Left Ear: External ear normal.  Nose: Nose normal.  Mouth/Throat: Oropharynx is clear and moist.  Eyes: Conjunctivae are normal. Pupils are equal, round, and reactive to light. Right eye exhibits no discharge. Left eye exhibits no discharge.  Neck: Normal range of motion. Neck  supple. No JVD present. No tracheal deviation present. No thyromegaly present.  Cardiovascular: Normal rate, regular rhythm and normal heart sounds.   Pulmonary/Chest: No stridor. No respiratory distress. She has wheezes. She exhibits no tenderness.  Abdominal: Soft. Bowel sounds are normal. She exhibits no distension and no mass. There is no tenderness. There is no rebound and no guarding.  Musculoskeletal: She exhibits no edema or tenderness.  Lymphadenopathy:    She has no cervical adenopathy.  Neurological: She displays normal reflexes. No cranial  nerve deficit. She exhibits normal muscle tone. Coordination normal.  Skin: No rash noted. No erythema.  Psychiatric: She has a normal mood and affect. Her behavior is normal. Judgment and thought content normal.    Lab Results  Component Value Date   WBC 9.4 02/21/2015   HGB 12.8 02/21/2015   HCT 38.5 02/21/2015   PLT 441.0* 02/21/2015   GLUCOSE 115* 02/21/2015   CHOL 221* 07/06/2013   TRIG 200.0* 07/06/2013   HDL 60.90 07/06/2013   LDLDIRECT 126.5 06/14/2012   LDLCALC 120* 07/06/2013   ALT 13 02/21/2015   AST 18 02/21/2015   NA 139 02/21/2015   K 4.7 02/21/2015   CL 100 02/21/2015   CREATININE 1.17 02/21/2015   BUN 20 02/21/2015   CO2 31 02/21/2015   TSH 1.21 11/08/2014   INR 1.0 09/04/2011   HGBA1C 5.8* 06/04/2012    Dg Chest 2 View  03/13/2015  CLINICAL DATA:  Six week history of shortness of breath, history of COPD, coronary artery disease, CHF, and previous episodes of pneumonia, former smoker EXAM: CHEST  2 VIEW COMPARISON:  Portable chest x-ray of June 23, 2014 FINDINGS: The lungs remain hyperinflated with hemidiaphragm flattening and increased AP dimension of the thorax. The heart and pulmonary vascularity are normal. The interstitial markings are both lungs are coarse but stable. There is apical pleural scarring bilaterally. There is calcification in the wall of the aortic arch and descending thoracic aorta. The bony thorax is unremarkable. IMPRESSION: COPD. Chronic interstitial prominence. There is no alveolar pneumonia, pulmonary edema, nor other acute cardiopulmonary abnormality. Electronically Signed   By: David  Martinique M.D.   On: 03/13/2015 16:50    Assessment & Plan:   There are no diagnoses linked to this encounter. I am having Ms. Macfarlane maintain her Vitamin D, BIOTIN PO, zoledronic acid, baclofen, ALPRAZolam, furosemide, ibuprofen, acetaminophen, albuterol, traZODone, predniSONE, Glycopyrrolate-Formoterol, and losartan.  No orders of the defined types  were placed in this encounter.     Follow-up: No Follow-up on file.  Walker Kehr, MD

## 2015-06-12 NOTE — Assessment & Plan Note (Signed)
On Vit D 

## 2015-06-12 NOTE — Assessment & Plan Note (Signed)
Compensated Losartan

## 2015-06-12 NOTE — Progress Notes (Signed)
Pre visit review using our clinic review tool, if applicable. No additional management support is needed unless otherwise documented below in the visit note. 

## 2015-06-12 NOTE — Assessment & Plan Note (Addendum)
2014-2017 COPD, CHF  On Prednisone 5 mg/d - doing fair  Potential benefits of a long term steroid  use as well as potential risks  and complications were explained to the patient and were aknowledged.

## 2015-06-28 ENCOUNTER — Telehealth: Payer: Self-pay | Admitting: Internal Medicine

## 2015-06-28 NOTE — Telephone Encounter (Signed)
Pt was in on 2/14 and Dr. Camila Li prescribed prednisoLONE 5 MG TABS tablet [id number] doesn't cover this and [facility] has been trying to send a request in for regular prednisone for quite some time now without response.  Can you please send her some prednisone asap to [facility] on [address].   She doesn't feel well

## 2015-06-28 NOTE — Telephone Encounter (Signed)
Can you advise on this

## 2015-06-29 MED ORDER — PREDNISONE 20 MG PO TABS: 40.0000 mg | ORAL_TABLET | Freq: Every day | ORAL | Status: DC

## 2015-06-29 NOTE — Telephone Encounter (Signed)
Spoke to pt and she stated that she is still not breathing any better.

## 2015-06-29 NOTE — Telephone Encounter (Signed)
Pt informed and was very appreciative.

## 2015-06-29 NOTE — Telephone Encounter (Signed)
Sent in prednisone. Take 2 pills daily for 5 days, if no improvement needs visit.

## 2015-06-29 NOTE — Telephone Encounter (Signed)
Since it is now 2 weeks later, is her breathing still bad?

## 2015-07-05 DIAGNOSIS — M8580 Other specified disorders of bone density and structure, unspecified site: Secondary | ICD-10-CM | POA: Diagnosis not present

## 2015-07-09 DIAGNOSIS — Z0000 Encounter for general adult medical examination without abnormal findings: Secondary | ICD-10-CM | POA: Diagnosis not present

## 2015-07-09 DIAGNOSIS — C678 Malignant neoplasm of overlapping sites of bladder: Secondary | ICD-10-CM | POA: Diagnosis not present

## 2015-07-10 ENCOUNTER — Encounter: Payer: Self-pay | Admitting: Internal Medicine

## 2015-07-10 ENCOUNTER — Ambulatory Visit (INDEPENDENT_AMBULATORY_CARE_PROVIDER_SITE_OTHER): Payer: Medicare Other | Admitting: Internal Medicine

## 2015-07-10 VITALS — BP 126/74 | HR 90 | Ht 63.0 in | Wt 114.2 lb

## 2015-07-10 DIAGNOSIS — J449 Chronic obstructive pulmonary disease, unspecified: Secondary | ICD-10-CM | POA: Diagnosis not present

## 2015-07-10 MED ORDER — TIOTROPIUM BROMIDE MONOHYDRATE 2.5 MCG/ACT IN AERS: INHALATION_SPRAY | RESPIRATORY_TRACT | Status: DC

## 2015-07-10 MED ORDER — BUDESONIDE-FORMOTEROL FUMARATE 160-4.5 MCG/ACT IN AERO: INHALATION_SPRAY | RESPIRATORY_TRACT | Status: DC

## 2015-07-10 NOTE — Patient Instructions (Addendum)
Plan A = Automatic = Symbicort 160 2 in am and pm and Spiriva is 2 puffs each am   Plan B = Backup Only use your albuterol as a rescue medication to be used if you can't catch your breath by resting or doing a relaxed purse lip breathing pattern.  - The less you use it, the better it will work when you need it. - Ok to use up to 2 puffs  every 4 hours if you must but call for appointment if use goes up over your usual need - Don't leave home without it !!  (think of it like the spare tire for your car)   Pepcid 20 mg after bfast and after supper   GERD (REFLUX)  is an extremely common cause of respiratory symptoms just like yours , many times with no obvious heartburn at all.    It can be treated with medication, but also with lifestyle changes including elevation of the head of your bed (ideally with 6 inch  bed blocks),  Smoking cessation, avoidance of late meals, excessive alcohol, and avoid fatty foods, chocolate, peppermint, colas, red wine, and acidic juices such as orange juice.  NO MINT OR MENTHOL PRODUCTS SO NO COUGH DROPS  USE SUGARLESS CANDY INSTEAD (Jolley ranchers or Stover's or Life Savers) or even ice chips will also do - the key is to swallow to prevent all throat clearing. NO OIL BASED VITAMINS - use powdered substitutes.    Please schedule a follow up office visit in 4 weeks, sooner if needed When return bring your medications in 2 separate bags, the ones you take no matter(automatically)  what vs the as needed (only when you feel you need them)  - late add :  Needs alpha one levels next ov

## 2015-07-10 NOTE — Progress Notes (Signed)
Subjective:    Patient ID: Amber Snow, female    DOB: 31-May-1932  MRN: [id number]   Brief patient profile:  32  yowf with GOLD II copd by pfts 07/2009 quit smoking June 2013  due to progressive doe x 5 years worse since quit referred to pulmonary clinic 06/17/2012 by Dr Ignacia Palma p admit :  Admit date: 06/01/2012  Discharge date: 06/05/2012  Discharge Diagnoses:  Active Problems:  HYPERLIPIDEMIA  LEFT BUNDLE BRANCH BLOCK  COPD  LOW BACK PAIN, CHRONIC  LV dysfunction  Lung nodule > outpt pet rec COPD exacerbation  Nonischemic cardiomyopathy   COPD exacerbation  -CT angiogram of the chest was negative for pulmonary embolus  -Patient was started on Solu-Medrol, Levaquin, and bronchodilators  -Wheezing has improved with Solu-Medrol  -changed to by mouth prednisone without decompensation  -Continue every 6 hour albuterol-And when necessary for shortness of breath  -Continue levofloxacin--adjust Levaquin dose for renal function--final dose to be given on 06/06/2012 to complete 5 days  -I believe that her continuing dyspnea on exertion is a combination of her COPD exacerbation, cardiomyopathy, and deconditioning.  -The patient was subsequently weaned off of oxygen supplementation. Her oxygen saturation was 90-94% with activity on room air.  -She will go home with a prednisone wean starting with 50 mg on day #1 and decreasing by 10 mg daily    06/17/2012 1st pulmonary eval/ Amber Snow s/p hosp in January 2014 cc doe x 50 ft variable improvement with saba =proaire rec Symbicort 2 every 12 hours Spiriva once daily  Only use your albuterol (proaire) as a rescue medication Work on inhaler technique:    03/14/2013 f/u ov/Amber Snow re: copd GOLD II/ SPN LUL Chief Complaint  Patient presents with  . Followup with cxr    Pt states her breathing seems to be doing well. She denies any new co's today. Pt states she uses rescue inhaler 2-3 times per month on average.   freq cough, choking sensation  esp in am assoc with dry mouth and feeling congested but no excess mucus or noct exac- overall much better though and can really tell when misses dose of symbicort Not really limited from desired activities due to sob on smb and spiriva rec Stop spiriva for now to see what difference this makes    03/13/2015 acute extended ov/Amber Snow re: sob/ GOLD II copd  Chief Complaint  Patient presents with  . Acute Visit    Pt c/o increased DOE with any exertion for the past month. She was out of breathing walking from lobby to first exam room today. She has minimal cough with clear sputum.  She is using rescue inhaler 2 x per day on average.   Gradually worse since bladder  surgery  02/06/15 esp the last 2 weeks/ comfortable lying flat / maint on macrobid  rec Try prilosec otc 20mg   Take 30-60 min before first meal of the day and Pepcid ac (famotidine) 20 mg one @  bedtime   Stop symbicort and start stiolto  2 pffs each am  Prednisone 10 mg take  4 each am x 2 days,   2 each am x 2 days,  1 each am x 2 days and stop     04/10/2015  f/u ov/Amber Snow re: GOLD II/ maint rx symb 160 2bid and alb at least bid Chief Complaint  Patient presents with  . Follow-up    Pt c/o continued cough. Pt states that she tried [employer] but it did not last and  she was having to use albuterol HFA nightly with improvement in symptoms. She has gone back to using Symbicort twice daily and this has seemed to help more. She does not need the albuterol HFA as much.   MMRC1 = can walk nl pace, flat grade, can't hurry or go uphills or steps s sob  And interested in any additional med that will help her do her housework/ steps rec Stop symbicort >>  BEVESPI  Take 2 puffs first thing in am and then another 2 puffs about 12 hours later.  Work on maintaining perfect inhaler technique    07/10/2015  f/u ov/Amber Snow re: GOLD II/ could not afford bevespi and not convinced better than symbicort   Chief Complaint  Patient presents with  .  Follow-up    Increased SOB x 2 wks. She has not felt well in general. She also c/o cough- mainly non prod. She is using proair once daily on average.   onset gradually worse x 2 weeks with any activity >  ? If better with albuterol ? Better with prednisone and feels worse off it in terms of energy but not cough/sob which is worse with use of arms.  No obvious day to day  variabilty or assoc excess or purulent sputum or cp or chest tightness, subjective wheeze overt sinus or hb symptoms. No unusual exp hx or h/o childhood pna/ asthma or knowledge of premature birth.   Sleeping ok without nocturnal  or early am exacerbation  of respiratory  c/o's or need for noct saba. Also denies any obvious fluctuation of symptoms with weather or environmental changes or other aggravating or alleviating factors except as outlined above  Current Medications, Allergies, Past Medical History, Past Surgical History, Family History, and Social History were reviewed in [employer] record.  ROS  The following are not active complaints unless bolded sore throat, dysphagia, dental problems, itching, sneezing,  nasal congestion or excess/ purulent secretions, ear ache,   fever, chills, sweats, unintended wt loss, pleuritic or exertional cp, hemoptysis,  orthopnea pnd or leg swelling, presyncope, palpitations, heartburn, abdominal pain, anorexia, nausea, vomiting, diarrhea  or change in bowel or urinary habits, change in stools or urine, dysuria,hematuria,  rash, arthralgias, visual complaints, headache, numbness weakness or ataxia or problems with walking or coordination,  change in mood/affect or memory.            Objective:   Physical Exam    thin anxious amb wf nad  07/29/2012  105  >102 09/24/2012 > 10/26/2012  99 > 12/10/2012 99 > 104 03/14/2013 > 06/20/2013 107 > 109 07/28/2013 > 11/09/2013  109 > 11/10/2014   113 > 03/13/2015  113 > 04/10/2015   113 > 07/10/2015  114    Vital signs reviewed     HEENT mild turbinate edema.  Oropharynx no thrush or excess pnd or cobblestoning.  No JVD or cervical adenopathy. Mild accessory muscle hypertrophy. Trachea midline, nl thryroid. Chest was hyperinflated by percussion with diminished breath sounds and moderate increased exp time without wheeze. Hoover sign positive at mid inspiration. Regular rate and rhythm without murmur gallop or rub or increase P2 or edema.  Abd: no hsm, nl excursion. Ext warm without cyanosis or clubbing.          CXR PA and Lateral:   03/13/2015 :    I personally reviewed images and agree with radiology impression as follows:   COPD. Chronic interstitial prominence. There is no alveolar pneumonia, pulmonary edema, nor  other acute cardiopulmonary abnormality.             Assessment & Plan:

## 2015-07-10 NOTE — Assessment & Plan Note (Addendum)
- PFT's 07/31/09 FEV1  1.36 (74%) ratio 45 and no better p B2,  DLCO 66%  - quit smoking June 2013 - PFT's 07/29/2012 FEV1 1.10 (63%) ratio 46 and no change p B2 and DLCO 57%  - 10/26/2012  Walked RA x 3 laps @ 185 ft each stopped due to end of study, no desats - Trial off spiriva 03/14/2013 > rechallenged 06/20/12 due to worse doe > d/cd around 06/26/13 and no worse off it  - 03/13/2015 trained on respimat > 90% effecitve > changed to stiolto 2 pffs each am > preferred symbicort so 04/10/2015 rec bevespi trial - 04/10/2015  extensive coaching HFA effectiveness =    75%  - 04/10/2015  Walked RA x 3 laps @ 185 ft each stopped due to  End of study, nl pace, no sob or desat/ did c/o leg fatigue bilaterally    DDX of  difficult airways management almost all start with A and  include Adherence, Ace Inhibitors, Acid Reflux, Active Sinus Disease, Alpha 1 Antitripsin deficiency, Anxiety masquerading as Airways dz,  ABPA,  Allergy(esp in young), Aspiration (esp in elderly), Adverse effects of meds,  Active smokers, A bunch of PE's (a small clot burden can't cause this syndrome unless there is already severe underlying pulm or vascular dz with poor reserve) plus two Bs  = Bronchiectasis and Beta blocker use..and one C= CHF  Adherence is always the initial "prime suspect" and is a multilayered concern that requires a "trust but verify" approach in every patient - starting with knowing how to use medications, especially inhalers, correctly, keeping up with refills and understanding the fundamental difference between maintenance and prns vs those medications only taken for a very short course and then stopped and not refilled.  - The proper method of use, as well as anticipated side effects, of a metered-dose inhaler are discussed and demonstrated to the patient. Improved effectiveness after extensive coaching during this visit to a level of approximately 75 % from a baseline of 50 % so add spiriva respimat to the symbicort  as it so similar in terms of delivery and she is familiar with stiolto device  - need a trust but verify approach here   ? Acid (or non-acid) GERD > always difficult to exclude as up to 75% of pts in some series report no assoc GI/ Heartburn symptoms> rec max (24h)  acid suppression and diet restrictions/ reviewed and instructions given in writing.   ? Anxiety > usually at the bottom of this list of usual suspects but should be much higher on this pt's based on H and P and note already on psychotropics  ? Allergy/ asthma > very unlikely s more convincing response to pred/ albuterol   ? Alpha one > need to complete w/u when returns  ? ACEi/ arb effect. For reasons that may related to vascular permability and nitric oxide pathways but not elevated  bradykinin levels (as seen with  ACEi use) losartan in the generic form has been reported now from mulitple sources  to cause a similar pattern of non-specific  upper airway symptoms as seen with acei.   This has not been reported with exposure to the other ARB's to date, so it may be necessary  to try either generic diovan or avapro if ARB needed or use an alternative class altogether.  See:  Lelon Frohlich Allergy Asthma Immunol  2008: 101: p [phone]     I had an extended discussion with the patient reviewing all relevant  studies completed to date and  lasting 15 to 20 minutes of a 25 minute visit    Each maintenance medication was reviewed in detail including most importantly the difference between maintenance and prns and under what circumstances the prns are to be triggered using an action plan format that is not reflected in the computer generated alphabetically organized AVS.    Please see instructions for details which were reviewed in writing and the patient given a copy highlighting the part that I personally wrote and discussed at today's ov.

## 2015-07-11 ENCOUNTER — Other Ambulatory Visit: Payer: Self-pay | Admitting: Urology

## 2015-07-26 DIAGNOSIS — M47816 Spondylosis without myelopathy or radiculopathy, lumbar region: Secondary | ICD-10-CM | POA: Diagnosis not present

## 2015-07-26 DIAGNOSIS — M5416 Radiculopathy, lumbar region: Secondary | ICD-10-CM | POA: Diagnosis not present

## 2015-07-26 DIAGNOSIS — M791 Myalgia: Secondary | ICD-10-CM | POA: Diagnosis not present

## 2015-07-26 DIAGNOSIS — M542 Cervicalgia: Secondary | ICD-10-CM | POA: Diagnosis not present

## 2015-07-26 NOTE — H&P (Signed)
History of Present Illness F/u - PCP Dr. 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   Last BCG: Jan 2017 completed second BCG induction 6/6  Last upper tract: Feb 2016 CT hematuria  Last cysto: Sept 2016 at bx     2- nephrolithiasis - s/p URS. Feb 2016 CT with small bilateral stones        March 2017 interval history  Patient returns and continue management of bladder cancer. She's been well without dysuria or gross hematuria but continues to have some discomfort in her lower back that radiates around to the left lower quadrant. She has some urinary frequency. She completed BCG.   Past Medical History Problems  1. History of Calculus of ureter (N20.1) 2. History of kidney stones (E95.284)  Surgical History Problems  1. History of Cataract Surgery 2. History of Cystoscopy With Biopsy 3. History of Cystoscopy With Fragmentation Of Bladder Calculus Over 2.5cm 4. History of Cystoscopy With Fulguration Medium Lesion (2-5cm) 5. History of Cystoscopy With Fulguration Minor Lesion (Under 85m) 6. History of Cystoscopy With Fulguration Minor Lesion (Under 527m 7. History of Cystoscopy With Fulguration Small Lesion (5-2066m8. History of Cystoscopy With Insertion Of Ureteral Stent Right 9. History of Hand Surgery 10. History of Renal Lithotripsy 11. History of Tonsillectomy  Current Meds 1. Albuterol Sulfate TABS;  Therapy: (Recorded:25Mar2015) to Recorded 2. Ibuprofen 800 MG Oral Tablet;  Therapy: [id number] Recorded 3. Losartan Potassium 50 MG Oral Tablet;  Therapy: (Recorded:01Dec2014) to Recorded 4. ProAir HFA 108 (90 Base) MCG/ACT Inhalation Aerosol Solution;  Therapy: 22Aug2012 to Recorded 5. Reclast 5 MG/100ML Intravenous Solution;  Therapy:  (Recorded:25Mar2015) to Recorded 6. Symbicort 160-4.5 MCG/ACT Inhalation Aerosol;  Therapy: [id number] Recorded 7. TraZODone HCl - 50 MG Oral Tablet;  Therapy: 21Jan2013 to Recorded 8. Vitamin D TABS;  Therapy: (Recorded:25Mar2015) to Recorded  Allergies Medication  1. Cipro TABS 2. Boniva KIT 3. Penicillins  Family History Problems  1. Family history of Family Health Status Number Of Children   1 son 2. Family history of Father Deceased At Age ___ 3. Family history of Heart Disease : Father 4. Family history of Hypertension : Mother 5. 58amily history of Mother Deceased At Age ___ 6. 18amily history of Nephrolithiasis  Social History Problems  1. Denied: History of Alcohol Use (History) 2. Caffeine Use   3 to 4 cups 3. Former smoker ([phone]. Marital History - Currently Married 5. Tobacco use (Z72.0)  Vitals Vital Signs [Data Includes: Last 1 Day]  Recorded: 59M95GLO7564:01PM  Blood Pressure: 154 / 78 Temperature: 98.4 F Heart Rate: 99  Physical Exam Abdomen: The abdomen is soft and nontender. No masses are palpated. No CVA tenderness. No hernias are palpable. No hepatosplenomegaly noted.  Genitourinary:  Chaperone Present: .  Examination of the external genitalia shows normal female external genitalia and no lesions. The urethra is normal in appearance and not tender. There is no urethral mass. Vaginal exam demonstrates no abnormalities. The adnexa are palpably normal. The bladder is non tender and not distended. The anus is normal on inspection. The perineum is normal on inspection.    Results/Data Urine [Data Includes: Last 1 Day]   [id number] YELLOW   APPEARANCE CLEAR   SPECIFIC GRAVITY 1.010   pH 6.0  GLUCOSE NEGATIVE   BILIRUBIN NEGATIVE   KETONE NEGATIVE   BLOOD TRACE   PROTEIN NEGATIVE   NITRITE NEGATIVE   LEUKOCYTE ESTERASE TRACE   SQUAMOUS EPITHELIAL/HPF NONE SEEN HPF  WBC 0-5 WBC/HPF  RBC 3-10 RBC/HPF  BACTERIA NONE SEEN HPF   CRYSTALS NONE SEEN HPF  CASTS NONE SEEN LPF  Yeast NONE SEEN HPF   Procedure  Procedure: Cystoscopy  Chaperone Present: Erica.  Indication: History of Urothelial Carcinoma.  Informed Consent: Risks, benefits, and potential adverse events were discussed and informed consent was obtained from the patient.  Prep: The patient was prepped with betadine.  Antibiotic prophylaxis: Trimethoprim/Sulfamethoxazole.  Procedure Note:  Urethral meatus:. No abnormalities.  Anterior urethra: No abnormalities.  Bladder: Visulization was clear. The ureteral orifices were in the normal anatomic position bilaterally and had clear efflux of urine. A systematic survey of the bladder demonstrated no bladder tumors or stones. The mucosa was smooth without abnormalities. Examination of the bladder demonstrated erythematous mucosa. Multiple stones were identified in the bladder. I believe she has some dystrophic calcification on the right and the left side of the bladder from her prior biopsies. I tried to scrape some of the necrotic tissue and calcium off the bladder wall with the scope at I could not and the patient was somewhat uncomfortable. Some scattered areas of erythema. The patient tolerated the procedure well.  Complications: None.    Assessment Assessed  1. Primary malignant neoplasm of overlapping sites of bladder (C67.8)  Plan Health Maintenance  1. UA With REFLEX; [Do Not Release]; Status:Complete;   Done: 65KPT4656 02:35PM Primary malignant neoplasm of overlapping sites of bladder  2. Follow-up Schedule Surgery Office  Follow-up  Status: Hold For - Appointment   Requested for: [id number] 3. Cysto; Status:Complete;   Done: [id number]  Discussion/Summary Multifocal high-grade and low-grade TA-believe she has some dystrophic calcification from her biopsies. This could be causing some bladder discomfort and frequency. We discussed the nature risk and benefits of cystoscopy with bladder biopsy  fulguration, removal of the bladder stone like material, possible TURBT as well as retrograde pyelograms. All questions answered and she elects to proceed. We'll plan to repeat cystoscopy in about 3 months after the procedure and then resume BCG Jul 2017.      Signatures Electronically signed by : Festus Aloe, M.D.; Jul 09 2015  4:35PM EST

## 2015-07-27 NOTE — Patient Instructions (Addendum)
Amber Snow  07/27/2015   Your procedure is scheduled on:  07-31-15  Report to [facility] Main  Entrance take [facility]  elevators to 3rd floor to  [place] at 0800  AM.  Call this number if you have problems the morning of surgery [phone]   Remember: ONLY 1 PERSON MAY GO WITH YOU TO SHORT STAY TO GET  READY MORNING OF [place].  Do not eat food or drink liquids :After Midnight.     Take these medicines the morning of surgery with A SIP OF WATER: Use/ bring Inhalers DO NOT TAKE ANY DIABETIC MEDICATIONS DAY OF YOUR SURGERY                               You may not have any metal on your body including hair pins and              piercings  Do not wear jewelry, make-up, lotions, powders or perfumes, deodorant             Do not wear nail polish.  Do not shave  48 hours prior to surgery.              Men may shave face and neck.   Do not bring valuables to the hospital. [place].  Contacts, dentures or bridgework may not be worn into surgery.  Leave suitcase in the car. After surgery it may be brought to your room.     Patients discharged the day of surgery will not be allowed to drive home.  Name and phone number of your driver:Amber Snow , spouse [phone]  Special Instructions: N/A              Please read over the following fact sheets you were given: _____________________________________________________________________             [facility] - Preparing for Surgery Before surgery, you can play an important role.  Because skin is not sterile, your skin needs to be as free of germs as possible.  You can reduce the number of germs on your skin by washing with CHG (chlorahexidine gluconate) soap before surgery.  CHG is an antiseptic cleaner which kills germs and bonds with the skin to continue killing germs even after washing. Please DO NOT use if you have an allergy to CHG or  antibacterial soaps.  If your skin becomes reddened/irritated stop using the CHG and inform your nurse when you arrive at Short Stay. Do not shave (including legs and underarms) for at least 48 hours prior to the first CHG shower.  You may shave your face/neck. Please follow these instructions carefully:  1.  Shower with CHG Soap the night before surgery and the  morning of Surgery.  2.  If you choose to wash your hair, wash your hair first as usual with your  normal  shampoo.  3.  After you shampoo, rinse your hair and body thoroughly to remove the  shampoo.                           4.  Use CHG as you would any other liquid soap.  You can apply chg  directly  to the skin and wash                       Gently with a scrungie or clean washcloth.  5.  Apply the CHG Soap to your body ONLY FROM THE NECK DOWN.   Do not use on face/ open                           Wound or open sores. Avoid contact with eyes, ears mouth and genitals (private parts).                       Wash face,  Genitals (private parts) with your normal soap.             6.  Wash thoroughly, paying special attention to the area where your surgery  will be performed.  7.  Thoroughly rinse your body with warm water from the neck down.  8.  DO NOT shower/wash with your normal soap after using and rinsing off  the CHG Soap.                9.  Pat yourself dry with a clean towel.            10.  Wear clean pajamas.            11.  Place clean sheets on your bed the night of your first shower and do not  sleep with pets. Day of Surgery : Do not apply any lotions/deodorants the morning of surgery.  Please wear clean clothes to the hospital/surgery center.  FAILURE TO FOLLOW THESE INSTRUCTIONS MAY RESULT IN THE CANCELLATION OF YOUR SURGERY PATIENT SIGNATURE_________________________________  NURSE SIGNATURE__________________________________  ________________________________________________________________________

## 2015-07-28 ENCOUNTER — Other Ambulatory Visit: Payer: Self-pay | Admitting: Internal Medicine

## 2015-07-30 ENCOUNTER — Encounter (HOSPITAL_COMMUNITY)
Admission: RE | Admit: 2015-07-30 | Discharge: 2015-07-30 | Disposition: A | Discharge status: Home / Self Care | Payer: Medicare Other | Source: Ambulatory Visit | Attending: Urology | Admitting: Urology

## 2015-07-30 ENCOUNTER — Encounter (HOSPITAL_COMMUNITY): Payer: Self-pay

## 2015-07-30 ENCOUNTER — Ambulatory Visit (HOSPITAL_COMMUNITY)
Admission: RE | Admit: 2015-07-30 | Discharge: 2015-07-30 | Disposition: A | Discharge status: Home / Self Care | Payer: Medicare Other | Source: Ambulatory Visit | Attending: Obstetrics | Admitting: Obstetrics

## 2015-07-30 DIAGNOSIS — M949 Disorder of cartilage, unspecified: Secondary | ICD-10-CM | POA: Insufficient documentation

## 2015-07-30 DIAGNOSIS — C679 Malignant neoplasm of bladder, unspecified: Secondary | ICD-10-CM | POA: Diagnosis not present

## 2015-07-30 DIAGNOSIS — Z01812 Encounter for preprocedural laboratory examination: Secondary | ICD-10-CM | POA: Diagnosis not present

## 2015-07-30 DIAGNOSIS — M899 Disorder of bone, unspecified: Secondary | ICD-10-CM | POA: Diagnosis not present

## 2015-07-30 HISTORY — DX: Cervicalgia: M54.2

## 2015-07-30 LAB — CBC
HCT: 40.4 % (ref 36.0–46.0)
Hemoglobin: 13.5 g/dL (ref 12.0–15.0)
MCH: 29.9 pg (ref 26.0–34.0)
MCHC: 33.4 g/dL (ref 30.0–36.0)
MCV: 89.6 fL (ref 78.0–100.0)
PLATELETS: 360 10*3/uL (ref 150–400)
RBC: 4.51 MIL/uL (ref 3.87–5.11)
RDW: 13.2 % (ref 11.5–15.5)
WBC: 10.5 10*3/uL (ref 4.0–10.5)

## 2015-07-30 LAB — BASIC METABOLIC PANEL
Anion gap: 9 (ref 5–15)
BUN: 19 mg/dL (ref 6–20)
CO2: 25 mmol/L (ref 22–32)
CREATININE: 0.94 mg/dL (ref 0.44–1.00)
Calcium: 9.8 mg/dL (ref 8.9–10.3)
Chloride: 107 mmol/L (ref 101–111)
GFR calc Af Amer: >60 mL/min (ref >60)
GFR, EST NON AFRICAN AMERICAN: 56 mL/min — AB (ref >60)
GLUCOSE: 95 mg/dL (ref 65–99)
Potassium: 4 mmol/L (ref 3.5–5.1)
SODIUM: 141 mmol/L (ref 135–145)

## 2015-07-30 MED ORDER — SODIUM CHLORIDE 0.9 % IV SOLN
Freq: Once | INTRAVENOUS | Status: AC
  Administered 2015-07-30: 12:00:00 via INTRAVENOUS

## 2015-07-30 MED ORDER — ZOLEDRONIC ACID 5 MG/100ML IV SOLN
5.0000 mg | Freq: Once | INTRAVENOUS | Status: AC
  Administered 2015-07-30: 5 mg via INTRAVENOUS
  Filled 2015-07-30: qty 100

## 2015-07-30 NOTE — Discharge Instructions (Signed)
Drink  Fluids/water as tolerated over the next 72 hours Tylenol  as directed  Continue Calcium and Vit D as directed by your MD      Reclast Zoledronic Acid injection (Paget's Disease, Osteoporosis) What is this medicine? ZOLEDRONIC ACID (ZOE le dron ik AS id) lowers the amount of calcium loss from bone. It is used to treat Paget's disease and osteoporosis in women. This medicine may be used for other purposes; ask your health care provider or pharmacist if you have questions. What should I tell my health care provider before I take this medicine? They need to know if you have any of these conditions: -aspirin-sensitive asthma -cancer, especially if you are receiving medicines used to treat cancer -dental disease or wear dentures -infection -kidney disease -low levels of calcium in the blood -past surgery on the parathyroid gland or intestines -receiving corticosteroids like dexamethasone or prednisone -an unusual or allergic reaction to zoledronic acid, other medicines, foods, dyes, or preservatives -pregnant or trying to get pregnant -breast-feeding How should I use this medicine? This medicine is for infusion into a vein. It is given by a health care professional in a hospital or clinic setting. Talk to your pediatrician regarding the use of this medicine in children. This medicine is not approved for use in children. Overdosage: If you think you have taken too much of this medicine contact a poison control center or emergency room at once. NOTE: This medicine is only for you. Do not share this medicine with others. What if I miss a dose? It is important not to miss your dose. Call your doctor or health care professional if you are unable to keep an appointment. What may interact with this medicine? -certain antibiotics given by injection -NSAIDs, medicines for pain and inflammation, like ibuprofen or naproxen -some diuretics like bumetanide,  furosemide -teriparatide This list may not describe all possible interactions. Give your health care provider a list of all the medicines, herbs, non-prescription drugs, or dietary supplements you use. Also tell them if you smoke, drink alcohol, or use illegal drugs. Some items may interact with your medicine. What should I watch for while using this medicine? Visit your doctor or health care professional for regular checkups. It may be some time before you see the benefit from this medicine. Do not stop taking your medicine unless your doctor tells you to. Your doctor may order blood tests or other tests to see how you are doing. Women should inform their doctor if they wish to become pregnant or think they might be pregnant. There is a potential for serious side effects to an unborn child. Talk to your health care professional or pharmacist for more information. You should make sure that you get enough calcium and vitamin D while you are taking this medicine. Discuss the foods you eat and the vitamins you take with your health care professional. Some people who take this medicine have severe bone, joint, and/or muscle pain. This medicine may also increase your risk for jaw problems or a broken thigh bone. Tell your doctor right away if you have severe pain in your jaw, bones, joints, or muscles. Tell your doctor if you have any pain that does not go away or that gets worse. Tell your dentist and dental surgeon that you are taking this medicine. You should not have major dental surgery while on this medicine. See your dentist to have a dental exam and fix any dental problems before starting this medicine. Take  good care of your teeth while on this medicine. Make sure you see your dentist for regular follow-up appointments. What side effects may I notice from receiving this medicine? Side effects that you should report to your doctor or health care professional as soon as possible: -allergic reactions  like skin rash, itching or hives, swelling of the face, lips, or tongue -anxiety, confusion, or depression -breathing problems -changes in vision -eye pain -feeling faint or lightheaded, falls -jaw pain, especially after dental work -mouth sores -muscle cramps, stiffness, or weakness -redness, blistering, peeling or loosening of the skin, including inside the mouth -trouble passing urine or change in the amount of urine Side effects that usually do not require medical attention (report to your doctor or health care professional if they continue or are bothersome): -bone, joint, or muscle pain -constipation -diarrhea -fever -hair loss -irritation at site where injected -loss of appetite -nausea, vomiting -stomach upset -trouble sleeping -trouble swallowing -weak or tired This list may not describe all possible side effects. Call your doctor for medical advice about side effects. You may report side effects to FDA at 1-800-FDA-1088. Where should I keep my medicine? This drug is given in a hospital or clinic and will not be stored at home. NOTE: This sheet is a summary. It may not cover all possible information. If you have questions about this medicine, talk to your doctor, pharmacist, or health care provider.    2016, Elsevier/Gold Standard. (2013-09-10 14:19:57)

## 2015-07-30 NOTE — Pre-Procedure Instructions (Addendum)
Ekg 5'16 Epic. CXR 11'16 Epic.

## 2015-07-31 ENCOUNTER — Encounter (HOSPITAL_COMMUNITY): Payer: Self-pay | Admitting: *Deleted

## 2015-07-31 ENCOUNTER — Encounter (HOSPITAL_COMMUNITY)
Admission: RE | Disposition: A | Discharge status: Home / Self Care | Payer: Self-pay | Source: Ambulatory Visit | Attending: Urology

## 2015-07-31 ENCOUNTER — Ambulatory Visit (HOSPITAL_COMMUNITY): Payer: Medicare Other | Admitting: Certified Registered Nurse Anesthetist

## 2015-07-31 ENCOUNTER — Ambulatory Visit (HOSPITAL_COMMUNITY)
Admission: RE | Admit: 2015-07-31 | Discharge: 2015-07-31 | Disposition: A | Discharge status: Home / Self Care | Payer: Medicare Other | Source: Ambulatory Visit | Attending: Urology | Admitting: Urology

## 2015-07-31 DIAGNOSIS — Z7951 Long term (current) use of inhaled steroids: Secondary | ICD-10-CM | POA: Insufficient documentation

## 2015-07-31 DIAGNOSIS — Z87442 Personal history of urinary calculi: Secondary | ICD-10-CM | POA: Insufficient documentation

## 2015-07-31 DIAGNOSIS — C679 Malignant neoplasm of bladder, unspecified: Secondary | ICD-10-CM | POA: Diagnosis not present

## 2015-07-31 DIAGNOSIS — Z7983 Long term (current) use of bisphosphonates: Secondary | ICD-10-CM | POA: Insufficient documentation

## 2015-07-31 DIAGNOSIS — Z791 Long term (current) use of non-steroidal anti-inflammatories (NSAID): Secondary | ICD-10-CM | POA: Insufficient documentation

## 2015-07-31 DIAGNOSIS — I2510 Atherosclerotic heart disease of native coronary artery without angina pectoris: Secondary | ICD-10-CM | POA: Diagnosis not present

## 2015-07-31 DIAGNOSIS — Z87891 Personal history of nicotine dependence: Secondary | ICD-10-CM | POA: Diagnosis not present

## 2015-07-31 DIAGNOSIS — I509 Heart failure, unspecified: Secondary | ICD-10-CM | POA: Diagnosis not present

## 2015-07-31 DIAGNOSIS — Z79899 Other long term (current) drug therapy: Secondary | ICD-10-CM | POA: Diagnosis not present

## 2015-07-31 DIAGNOSIS — N3289 Other specified disorders of bladder: Secondary | ICD-10-CM

## 2015-07-31 DIAGNOSIS — C678 Malignant neoplasm of overlapping sites of bladder: Secondary | ICD-10-CM | POA: Diagnosis not present

## 2015-07-31 DIAGNOSIS — J449 Chronic obstructive pulmonary disease, unspecified: Secondary | ICD-10-CM | POA: Diagnosis not present

## 2015-07-31 DIAGNOSIS — Z08 Encounter for follow-up examination after completed treatment for malignant neoplasm: Secondary | ICD-10-CM | POA: Diagnosis present

## 2015-07-31 DIAGNOSIS — Z8551 Personal history of malignant neoplasm of bladder: Secondary | ICD-10-CM | POA: Insufficient documentation

## 2015-07-31 HISTORY — PX: TRANSURETHRAL RESECTION OF BLADDER TUMOR: SHX2575

## 2015-07-31 HISTORY — PX: CYSTOSCOPY WITH RETROGRADE PYELOGRAM, URETEROSCOPY AND STENT PLACEMENT: SHX5789

## 2015-07-31 SURGERY — CYSTOURETEROSCOPY, WITH RETROGRADE PYELOGRAM AND STENT INSERTION
Anesthesia: General

## 2015-07-31 MED ORDER — CEFAZOLIN SODIUM-DEXTROSE 2-4 GM/100ML-% IV SOLN
2.0000 g | INTRAVENOUS | Status: AC
Start: 2015-07-31 — End: 2015-07-31
  Administered 2015-07-31: 2 g via INTRAVENOUS
  Filled 2015-07-31: qty 100

## 2015-07-31 MED ORDER — CEFAZOLIN SODIUM-DEXTROSE 2-3 GM-% IV SOLR
INTRAVENOUS | Status: AC
  Filled 2015-07-31: qty 50

## 2015-07-31 MED ORDER — PHENYLEPHRINE HCL 10 MG/ML IJ SOLN
INTRAMUSCULAR | Status: DC | PRN
  Administered 2015-07-31 (×2): 40 ug via INTRAVENOUS

## 2015-07-31 MED ORDER — IOPAMIDOL (ISOVUE-300) INJECTION 61%
INTRAVENOUS | Status: DC | PRN
  Administered 2015-07-31: 13 mL

## 2015-07-31 MED ORDER — FENTANYL CITRATE (PF) 100 MCG/2ML IJ SOLN
INTRAMUSCULAR | Status: AC
  Filled 2015-07-31: qty 2

## 2015-07-31 MED ORDER — FENTANYL CITRATE (PF) 100 MCG/2ML IJ SOLN: 25.0000 ug | INTRAMUSCULAR | Status: DC | PRN

## 2015-07-31 MED ORDER — LIDOCAINE HCL (CARDIAC) 20 MG/ML IV SOLN
INTRAVENOUS | Status: DC | PRN
  Administered 2015-07-31: 60 mg via INTRAVENOUS

## 2015-07-31 MED ORDER — METOPROLOL TARTRATE 1 MG/ML IV SOLN
INTRAVENOUS | Status: AC
  Filled 2015-07-31: qty 5

## 2015-07-31 MED ORDER — LIDOCAINE HCL (CARDIAC) 20 MG/ML IV SOLN
INTRAVENOUS | Status: AC
  Filled 2015-07-31: qty 5

## 2015-07-31 MED ORDER — PROPOFOL 10 MG/ML IV BOLUS
INTRAVENOUS | Status: AC
  Filled 2015-07-31: qty 20

## 2015-07-31 MED ORDER — LACTATED RINGERS IV SOLN: INTRAVENOUS | Status: DC

## 2015-07-31 MED ORDER — METOPROLOL TARTRATE 1 MG/ML IV SOLN
2.5000 mg | INTRAVENOUS | Status: DC
  Administered 2015-07-31: 2.5 mg via INTRAVENOUS

## 2015-07-31 MED ORDER — 0.9 % SODIUM CHLORIDE (POUR BTL) OPTIME
TOPICAL | Status: DC | PRN
  Administered 2015-07-31: 1000 mL

## 2015-07-31 MED ORDER — IOPAMIDOL (ISOVUE-300) INJECTION 61%
INTRAVENOUS | Status: AC
  Filled 2015-07-31: qty 50

## 2015-07-31 MED ORDER — LIDOCAINE HCL 2 % EX GEL
CUTANEOUS | Status: AC
  Filled 2015-07-31: qty 5

## 2015-07-31 MED ORDER — ONDANSETRON HCL 4 MG/2ML IJ SOLN
INTRAMUSCULAR | Status: AC
  Filled 2015-07-31: qty 2

## 2015-07-31 MED ORDER — SODIUM CHLORIDE 0.9 % IR SOLN
Status: DC | PRN
  Administered 2015-07-31: 12000 mL via INTRAVESICAL

## 2015-07-31 MED ORDER — PROPOFOL 10 MG/ML IV BOLUS
INTRAVENOUS | Status: DC | PRN
  Administered 2015-07-31: 80 mg via INTRAVENOUS

## 2015-07-31 MED ORDER — PHENAZOPYRIDINE HCL 95 MG PO TABS: 95.0000 mg | ORAL_TABLET | Freq: Three times a day (TID) | ORAL | Status: DC | PRN

## 2015-07-31 MED ORDER — FENTANYL CITRATE (PF) 100 MCG/2ML IJ SOLN
INTRAMUSCULAR | Status: DC | PRN
  Administered 2015-07-31 (×6): 25 ug via INTRAVENOUS

## 2015-07-31 MED ORDER — LIDOCAINE HCL 2 % EX GEL
CUTANEOUS | Status: DC | PRN
  Administered 2015-07-31: 1

## 2015-07-31 MED ORDER — ONDANSETRON HCL 4 MG/2ML IJ SOLN
INTRAMUSCULAR | Status: DC | PRN
  Administered 2015-07-31: 4 mg via INTRAVENOUS

## 2015-07-31 MED ORDER — LACTATED RINGERS IV SOLN
INTRAVENOUS | Status: DC | PRN
  Administered 2015-07-31: 10:00:00 via INTRAVENOUS

## 2015-07-31 MED ORDER — IBUPROFEN 200 MG PO TABS
400.0000 mg | ORAL_TABLET | Freq: Once | ORAL | Status: AC
  Administered 2015-07-31: 400 mg via ORAL
  Filled 2015-07-31: qty 1
  Filled 2015-07-31: qty 2

## 2015-07-31 MED ORDER — DEXTROSE 5 % IV SOLN
1.0000 g | INTRAVENOUS | Status: DC
  Filled 2015-07-31: qty 10

## 2015-07-31 SURGICAL SUPPLY — 22 items
BAG URINE DRAINAGE (UROLOGICAL SUPPLIES) IMPLANT
BAG URO CATCHER STRL LF (MISCELLANEOUS) ×4 IMPLANT
BASKET ZERO TIP NITINOL 2.4FR (BASKET) ×2 IMPLANT
BSKT STON RTRVL ZERO TP 2.4FR (BASKET)
CATH INTERMIT  6FR 70CM (CATHETERS) ×4 IMPLANT
CATH URET 5FR 28IN CONE TIP (BALLOONS) ×2
CATH URET 5FR 70CM CONE TIP (BALLOONS) ×2 IMPLANT
CATH URET WHISTLE 6FR (CATHETERS) ×2 IMPLANT
CLOTH BEACON ORANGE TIMEOUT ST (SAFETY) ×4 IMPLANT
GLOVE BIO SURGEON STRL SZ7.5 (GLOVE) ×4 IMPLANT
GLOVE BIOGEL M STRL SZ7.5 (GLOVE) ×4 IMPLANT
GOWN STRL REUS W/TWL XL LVL3 (GOWN DISPOSABLE) ×4 IMPLANT
GUIDEWIRE STR DUAL SENSOR (WIRE) ×4 IMPLANT
LOOP CUT BIPOLAR 24F LRG (ELECTROSURGICAL) ×2 IMPLANT
LOOP CUTTING 26FR OLYMPUS (CUTTING LOOP) ×2 IMPLANT
MANIFOLD NEPTUNE II (INSTRUMENTS) ×4 IMPLANT
PACK CYSTO (CUSTOM PROCEDURE TRAY) ×4 IMPLANT
SHEATH ACCESS URETERAL 24CM (SHEATH) ×4 IMPLANT
SHEATH ACCESS URETERAL 38CM (SHEATH) ×4 IMPLANT
TUBING CONNECTING 10 (TUBING) ×3 IMPLANT
TUBING CONNECTING 10' (TUBING) ×1
WIRE COONS/BENSON .038X145CM (WIRE) ×2 IMPLANT

## 2015-07-31 NOTE — Transfer of Care (Signed)
Immediate Anesthesia Transfer of Care Note  Patient: Amber Snow  Procedure(s) Performed: Procedure(s): CYSTOSCOPY WITH FULGERATION BLADDER BIOPSY, CYSTOSCOPY WITH BILATERAL RETROGRADE PYELOGRAMS (Bilateral)  TRANSURETHRAL RESECTION OF BLADDER TUMOR (TURBT); REMOVAL OF DYSTROPHIC CALCIFICATIONS (N/A)  Patient Location: PACU  Anesthesia Type:General  Level of Consciousness:  sedated, patient cooperative and responds to stimulation  Airway & Oxygen Therapy:Patient Spontanous Breathing and Patient connected to face mask oxgen  Post-op Assessment:  Report given to PACU RN and Post -op Vital signs reviewed and stable  Post vital signs:  Reviewed and stable  Last Vitals:  Filed Vitals:   07/31/15 0815 07/31/15 1202  BP: 125/70 159/92  Pulse: 115 97  Resp: 22     Complications: No apparent anesthesia complications

## 2015-07-31 NOTE — Anesthesia Postprocedure Evaluation (Signed)
Anesthesia Post Note  Patient: Amber Snow  Procedure(s) Performed: Procedure(s) (LRB): CYSTOSCOPY WITH FULGERATION BLADDER BIOPSY, CYSTOSCOPY WITH BILATERAL RETROGRADE PYELOGRAMS (Bilateral)  TRANSURETHRAL RESECTION OF BLADDER TUMOR (TURBT); REMOVAL OF DYSTROPHIC CALCIFICATIONS (N/A)  Patient location during evaluation: PACU Anesthesia Type: General Level of consciousness: awake and alert Pain management: pain level controlled Vital Signs Assessment: post-procedure vital signs reviewed and stable Respiratory status: spontaneous breathing, nonlabored ventilation, respiratory function stable and patient connected to nasal cannula oxygen Cardiovascular status: blood pressure returned to baseline and stable (Pt fluctuating btw sinus rhythm at rate of 70-80 and sinus tach at rate of 100-120. Pt with chronic neck pain that she states causes her HR to be elevated. Mildly improved in rate with IV metoprolol, pt asymptomatic. Likely chronic daily issue. ) Postop Assessment: no signs of nausea or vomiting Anesthetic complications: no    Last Vitals:  Filed Vitals:   07/31/15 1330 07/31/15 1400  BP: 115/90 120/45  Pulse: 74 77  Temp: 36.5 C 36.4 C  Resp: 20 16    Last Pain:  Filed Vitals:   07/31/15 1402  PainSc: 0-No pain                 Tiajuana Amass

## 2015-07-31 NOTE — Op Note (Signed)
Preoperative diagnosis: Malignant neoplasm of bladder overlapping sites, dystrophic calcifications Postoperative diagnosis: Same  Procedure: Cystoscopy bladder biopsy and fulguration, bilateral retrograde pyelogram, removal of dystrophic bladder calcifications.  Surgeon: Junious Silk  Anesthesia: Gen.  Indication for procedure: 82 year old with history of high-grade urothelial carcinoma overlapping sites with extensive disease last fall and underwent resection and biopsies. She developed dystrophic calcifications were 2 large areas in the bladder. She was brought today for cystoscopy under anesthesia, removal of the dystrophic calcifications, retrograde pyelograms.  Findings: On exam under anesthesia the patient had some blisterlike lesions upon the suprapubic area or clustered may be more toward the left side. They seem to be in various stages of healing. There was no cellulitis or erythema around them.  The urethra had a small urethral call her local otherwise appeared normal, on cystoscopy the urethra appeared normal, the bladder contained some very fine patches of early recurrence on the right left in dome. The right was biopsied in the other areas fulgurated. There were 2 large areas of dystrophic calcification 1 just medial to the right ureteral orifice and one just lateral to the left ureteral orifice. Using the loop was able to scrape this calcium off down to the mucosa.  Left retrograde pyelogram-this outlined a single ureter single collecting system unit without filling defect, stricture or dilation. There was brisk washout.  Right retrograde pyelogram-this outlined a single ureter single collecting system unit without filling defect, stricture or dilation. There was brisk washout.  Description of procedure: After consent was obtained patient brought to the operating room. After adequate anesthesia she was placed in lithotomy position and prepped and draped in the usual sterile fashion. A  timeout was performed to confirm the patient and procedure. The cystoscope was passed per urethra and the bladder inspected. The bladder and the urethra were palpably normal. The bladder was inspected with a 70 and 30 scope. The rigid biopsy forceps were then passed and the right early bladder tumor recurrence was biopsied in the area lightly fulgurated with the loop. Similar area right at the bladder neck, left anterior and dome are also fulgurated. These were small early areas. It appeared superficial.  I then used a loop to scrape off the large dystrophic areas a calcification down to a hard base and was able to scrape this off the bladder mucosa in both areas. The calcifications were removed down to mucosa which appeared normal under both areas. Hemostasis was good at low-pressure, the scope was removed and lidocaine jelly infiltrated per urethra. She was awakened and taken to the recovery room in stable condition.   Complications: None  Blood loss: Minimal  Specimens to pathology: Right bladder biopsy  Drains: None  Disposition: Patient stable to PACU

## 2015-07-31 NOTE — Anesthesia Preprocedure Evaluation (Addendum)
Anesthesia Evaluation  Patient identified by MRN, date of birth, ID band Patient awake    Reviewed: Allergy & Precautions, NPO status , Patient's Chart, lab work & pertinent test results  Airway Mallampati: II  TM Distance: >3 FB Neck ROM: Full    Dental no notable dental hx.    Pulmonary COPD, former smoker,    Pulmonary exam normal breath sounds clear to auscultation       Cardiovascular + CAD and +CHF  Normal cardiovascular exam Rhythm:Regular Rate:Normal  02/2013 Echo: Left ventricle: The cavity size was normal. Wall thickness wasnormal. Systolic function was moderately reduced. The estimatedejection fraction was in the range of 35% to 40%. Dopplerparameters are consistent with abnormal left ventricularrelaxation (grade 1 diastolic dysfunction). - Ventricular septum: Septal motion showed abnormal function anddyssynergy.    Neuro/Psych negative neurological ROS  negative psych ROS   GI/Hepatic negative GI ROS, Neg liver ROS,   Endo/Other  negative endocrine ROS  Renal/GU negative Renal ROS  negative genitourinary   Musculoskeletal negative musculoskeletal ROS (+)   Abdominal   Peds negative pediatric ROS (+)  Hematology negative hematology ROS (+)   Anesthesia Other Findings   Reproductive/Obstetrics negative OB ROS                            Lab Results  Component Value Date   WBC 10.5 07/30/2015   HGB 13.5 07/30/2015   HCT 40.4 07/30/2015   MCV 89.6 07/30/2015   PLT 360 07/30/2015   Lab Results  Component Value Date   CREATININE 0.94 07/30/2015   BUN 19 07/30/2015   NA 141 07/30/2015   K 4.0 07/30/2015   CL 107 07/30/2015   CO2 25 07/30/2015    Anesthesia Physical  Anesthesia Plan  ASA: III  Anesthesia Plan: General   Post-op Pain Management:    Induction: Intravenous  Airway Management Planned: LMA  Additional Equipment:   Intra-op Plan:    Post-operative Plan: Extubation in OR  Informed Consent: I have reviewed the patients History and Physical, chart, labs and discussed the procedure including the risks, benefits and alternatives for the proposed anesthesia with the patient or authorized representative who has indicated his/her understanding and acceptance.   Dental advisory given  Plan Discussed with: CRNA and Surgeon  Anesthesia Plan Comments:         Anesthesia Quick Evaluation

## 2015-07-31 NOTE — Interval H&P Note (Signed)
History and Physical Interval Note:  07/31/2015 10:24 AM  Amber Snow  has presented today for surgery, with the diagnosis of BLADDER CANCER  The various methods of treatment have been discussed with the patient and family. After consideration of risks, benefits and other options for treatment, the patient has consented to  Procedure(s): CYSTOSCOPY WITH FULGERATION BLADDER BIOPSY, CYSTOSCOPY WITH BILATERAL RETROGRADE PYELOGRAMS (Bilateral) POSSIBLE TRANSURETHRAL RESECTION OF BLADDER TUMOR (TURBT) (N/A) as a surgical intervention .  The patient's history has been reviewed, patient examined, no change in status, stable for surgery.  I have reviewed the patient's chart and labs.  Questions were answered to the patient's satisfaction.  She's passed some bladder "stones", but has no dysuria or gross hematuria. No fever. Discussed plan biopsy/stone removal; RGP's. She elects to proceed.     Chantell Kunkler

## 2015-07-31 NOTE — Discharge Instructions (Signed)
Cystoscopy, Care After  Refer to this sheet in the next few weeks. These instructions provide you with information on caring for yourself after your procedure. Your caregiver may also give you more specific instructions. Your treatment has been planned according to current medical practices, but problems sometimes occur. Call your caregiver if you have any problems or questions after your procedure.  HOME CARE INSTRUCTIONS    Things you can do to ease any discomfort after your procedure include:  · Drinking enough water and fluids to keep your urine clear or pale yellow.  · Taking a warm bath to relieve any burning feelings.  SEEK IMMEDIATE MEDICAL CARE IF:    · You have an increase in blood in your urine.  · You notice blood clots in your urine.  · You have difficulty passing urine.  · You have the chills.  · You have abdominal pain.  · You have a fever or persistent symptoms for more than 2-3 days.  · You have a fever and your symptoms suddenly get worse.  MAKE SURE YOU:    · Understand these instructions.  · Will watch your condition.  · Will get help right away if you are not doing well or get worse.     This information is not intended to replace advice given to you by your health care provider. Make sure you discuss any questions you have with your health care provider.     Document Released: 11/01/2004 Document Revised: 05/05/2014 Document Reviewed: 10/06/2011  Elsevier Interactive Patient Education ©2016 Elsevier Inc.   

## 2015-07-31 NOTE — Anesthesia Procedure Notes (Signed)
Procedure Name: LMA Insertion Date/Time: 07/31/2015 10:34 AM Performed by: Christell Faith L Pre-anesthesia Checklist: Patient identified, Emergency Drugs available, Suction available, Patient being monitored and Timeout performed Patient Re-evaluated:Patient Re-evaluated prior to inductionOxygen Delivery Method: Circle system utilized Preoxygenation: Pre-oxygenation with 100% oxygen Intubation Type: IV induction Ventilation: Mask ventilation without difficulty LMA: LMA inserted LMA Size: 4.0 Number of attempts: 1 Tube secured with: Tape Dental Injury: Teeth and Oropharynx as per pre-operative assessment

## 2015-08-06 ENCOUNTER — Other Ambulatory Visit: Payer: Self-pay | Admitting: Internal Medicine

## 2015-08-09 ENCOUNTER — Encounter: Payer: Self-pay | Admitting: Adult Health

## 2015-08-09 ENCOUNTER — Ambulatory Visit (INDEPENDENT_AMBULATORY_CARE_PROVIDER_SITE_OTHER): Payer: Medicare Other | Admitting: Adult Health

## 2015-08-09 VITALS — BP 132/74 | HR 81 | Ht 63.0 in | Wt 109.0 lb

## 2015-08-09 DIAGNOSIS — J449 Chronic obstructive pulmonary disease, unspecified: Secondary | ICD-10-CM

## 2015-08-09 DIAGNOSIS — B370 Candidal stomatitis: Secondary | ICD-10-CM | POA: Diagnosis not present

## 2015-08-09 MED ORDER — DOXYCYCLINE HYCLATE 100 MG PO TABS: 100.0000 mg | ORAL_TABLET | Freq: Two times a day (BID) | ORAL | Status: DC

## 2015-08-09 MED ORDER — CLOTRIMAZOLE 10 MG MT TROC: 10.0000 mg | Freq: Every day | OROMUCOSAL | Status: DC

## 2015-08-09 NOTE — Progress Notes (Signed)
Subjective:    Patient ID: Amber Snow, female    DOB: [birth year]  MRN: [id number]   Brief patient profile:  83  yowf with GOLD II copd by pfts 07/2009 quit smoking June 2013  due to progressive doe x 5 years worse since quit referred to pulmonary clinic 06/17/2012 by Dr Ignacia Palma p admit :  Admit date: 06/01/2012  Discharge date: 06/05/2012  Discharge Diagnoses:  Active Problems:  HYPERLIPIDEMIA  LEFT BUNDLE BRANCH BLOCK  COPD  LOW BACK PAIN, CHRONIC  LV dysfunction  Lung nodule > outpt pet rec COPD exacerbation  Nonischemic cardiomyopathy   COPD exacerbation  -CT angiogram of the chest was negative for pulmonary embolus  -Patient was started on Solu-Medrol, Levaquin, and bronchodilators  -Wheezing has improved with Solu-Medrol  -changed to by mouth prednisone without decompensation  -Continue every 6 hour albuterol-And when necessary for shortness of breath  -Continue levofloxacin--adjust Levaquin dose for renal function--final dose to be given on 06/06/2012 to complete 5 days  -I believe that her continuing dyspnea on exertion is a combination of her COPD exacerbation, cardiomyopathy, and deconditioning.  -The patient was subsequently weaned off of oxygen supplementation. Her oxygen saturation was 90-94% with activity on room air.  -She will go home with a prednisone wean starting with 50 mg on day #1 and decreasing by 10 mg daily    06/17/2012 1st pulmonary eval/ Wert s/p hosp in January 2014 cc doe x 50 ft variable improvement with saba =proaire rec Symbicort 2 every 12 hours Spiriva once daily  Only use your albuterol (proaire) as a rescue medication Work on inhaler technique:    03/14/2013 f/u ov/Wert re: copd GOLD II/ SPN LUL Chief Complaint  Patient presents with  . Followup with cxr    Pt states her breathing seems to be doing well. She denies any new co's today. Pt states she uses rescue inhaler 2-3 times per month on average.   freq cough, choking sensation  esp in am assoc with dry mouth and feeling congested but no excess mucus or noct exac- overall much better though and can really tell when misses dose of symbicort Not really limited from desired activities due to sob on smb and spiriva rec Stop spiriva for now to see what difference this makes    03/13/2015 acute extended ov/Wert re: sob/ GOLD II copd  Chief Complaint  Patient presents with  . Acute Visit    Pt c/o increased DOE with any exertion for the past month. She was out of breathing walking from lobby to first exam room today. She has minimal cough with clear sputum.  She is using rescue inhaler 2 x per day on average.   Gradually worse since bladder  surgery  02/06/15 esp the last 2 weeks/ comfortable lying flat / maint on macrobid  rec Try prilosec otc 20mg   Take 30-60 min before first meal of the day and Pepcid ac (famotidine) 20 mg one @  bedtime   Stop symbicort and start stiolto  2 pffs each am  Prednisone 10 mg take  4 each am x 2 days,   2 each am x 2 days,  1 each am x 2 days and stop     04/10/2015  f/u ov/Wert re: GOLD II/ maint rx symb 160 2bid and alb at least bid Chief Complaint  Patient presents with  . Follow-up    Pt c/o continued cough. Pt states that she tried [employer] but it did not last and  she was having to use albuterol HFA nightly with improvement in symptoms. She has gone back to using Symbicort twice daily and this has seemed to help more. She does not need the albuterol HFA as much.   MMRC1 = can walk nl pace, flat grade, can't hurry or go uphills or steps s sob  And interested in any additional med that will help her do her housework/ steps rec Stop symbicort >>  BEVESPI  Take 2 puffs first thing in am and then another 2 puffs about 12 hours later.  Work on maintaining perfect inhaler technique    07/10/2015  f/u ov/Wert re: GOLD II/ could not afford bevespi and not convinced better than symbicort   Chief Complaint  Patient presents with  .  Follow-up    Increased SOB x 2 wks. She has not felt well in general. She also c/o cough- mainly non prod. She is using proair once daily on average.   onset gradually worse x 2 weeks with any activity >  ? If better with albuterol ? Better with prednisone and feels worse off it in terms of energy but not cough/sob which is worse with use of arms. >>cont on Symbicort and Spiriva   08/09/2015 Follow up : [name]/COPD GOLD II  Pt returns for 1 month follow up and med review  Did not bring her meds today. She was upset that she is not seeing Dr. Melvyn Novas  Today.  Offered to reschedule x 2 but pt declined.   Complains of that her mouth is very sore.  Says can not tolerate Spiriva . Dries her out way too much , causes her tongue to be too sore.  Is taking Symbicort Twice daily  , says she rinses well after use.   Complains of 1 week of increased cough, congestion with green mucus. More sob than usual. No wheezing , chest pain, hemoptysis or fever.   Followed by Urology for bladder cancer . Had cystoscopy 07/31/15 .  Current Medications, Allergies, Past Medical History, Past Surgical History, Family History, and Social History were reviewed in [employer] record.  ROS  The following are not active complaints unless bolded sore throat, dysphagia, dental problems, itching, sneezing,  nasal congestion or excess/ purulent secretions, ear ache,   fever, chills, sweats, unintended wt loss, pleuritic or exertional cp, hemoptysis,  orthopnea pnd or leg swelling, presyncope, palpitations, heartburn, abdominal pain, anorexia, nausea, vomiting, diarrhea  or change in bowel or urinary habits, change in stools or urine, dysuria,hematuria,  rash, arthralgias, visual complaints, headache, numbness weakness or ataxia or problems with walking or coordination,  change in mood/affect or memory.            Objective:   Physical Exam    thin  amb wf nad  07/29/2012  105  >102 09/24/2012 > 10/26/2012   99 > 12/10/2012 99 > 104 03/14/2013 > 06/20/2013 107 > 109 07/28/2013 > 11/09/2013  109 > 11/10/2014   113 > 03/13/2015  113 > 04/10/2015   113 > 07/10/2015  114    Filed Vitals:   08/09/15 1411  BP: 132/74  Pulse: 81  Height: 5\' 3"  (1.6 m)  Weight: 109 lb (49.442 kg)  SpO2: 97%    Vital signs reviewed    HEENT mild turbinate edema.  Oropharynx with dryness noted, post pharynx with few white scattered patches c/w thrush. .  No JVD or cervical adenopathy. Mild accessory muscle hypertrophy. Trachea midline, nl thryroid. Chest was hyperinflated by  percussion with diminished breath sounds and moderate increased exp time without wheeze. Hoover sign positive at mid inspiration. Regular rate and rhythm without murmur gallop or rub or increase P2 or edema.  Abd: no hsm, nl excursion. Ext warm without cyanosis or clubbing.          CXR PA and Lateral:   03/13/2015 :     COPD. Chronic interstitial prominence. There is no alveolar pneumonia, pulmonary edema, nor other acute cardiopulmonary abnormality.     Tammy [name] NP-C  [facility]  08/09/2015         Assessment & Plan:

## 2015-08-09 NOTE — Patient Instructions (Signed)
Doxycycline 100 mg twice daily for 1 week, take with food. Use sunscreen. If outside as this antibiotic may cause sunburn Mucinex DM twice daily as needed for cough and congestion. Mycelex lozenges 5 times daily for 1 week Continue on Symbicort 2 puffs twice daily, rinse after each use Follow-up with Dr. Melvyn Novas in 3 months and as needed Please contact office for sooner follow up if symptoms do not improve or worsen or seek emergency care

## 2015-08-09 NOTE — Assessment & Plan Note (Signed)
Inhaler care discussed  Mycelex lozenges 5 times daily for 1 week Continue on Symbicort 2 puffs twice daily, rinse after each use Follow-up with Dr. Melvyn Novas in 3 months and as needed Please contact office for sooner follow up if symptoms do not improve or worsen or seek emergency care

## 2015-08-09 NOTE — Assessment & Plan Note (Signed)
Mild flare with bronchitis  Intolerant to Spiriva  Thrush noted on exam .   Plan  Doxycycline 100 mg twice daily for 1 week, take with food. Use sunscreen. If outside as this antibiotic may cause sunburn Mucinex DM twice daily as needed for cough and congestion. Mycelex lozenges 5 times daily for 1 week Continue on Symbicort 2 puffs twice daily, rinse after each use Follow-up with Dr. Melvyn Novas in 3 months and as needed Please contact office for sooner follow up if symptoms do not improve or worsen or seek emergency care

## 2015-08-13 ENCOUNTER — Other Ambulatory Visit: Payer: Self-pay | Admitting: Physical Medicine and Rehabilitation

## 2015-08-13 DIAGNOSIS — M542 Cervicalgia: Secondary | ICD-10-CM

## 2015-08-16 DIAGNOSIS — M791 Myalgia: Secondary | ICD-10-CM | POA: Diagnosis not present

## 2015-08-19 ENCOUNTER — Ambulatory Visit
Admission: RE | Admit: 2015-08-19 | Discharge: 2015-08-19 | Disposition: A | Discharge status: Home / Self Care | Payer: Medicare Other | Source: Ambulatory Visit | Attending: Physical Medicine and Rehabilitation | Admitting: Physical Medicine and Rehabilitation

## 2015-08-19 DIAGNOSIS — M542 Cervicalgia: Secondary | ICD-10-CM

## 2015-08-19 DIAGNOSIS — M4802 Spinal stenosis, cervical region: Secondary | ICD-10-CM | POA: Diagnosis not present

## 2015-08-30 DIAGNOSIS — M5412 Radiculopathy, cervical region: Secondary | ICD-10-CM | POA: Diagnosis not present

## 2015-08-30 DIAGNOSIS — M5010 Cervical disc disorder with radiculopathy, unspecified cervical region: Secondary | ICD-10-CM | POA: Diagnosis not present

## 2015-09-03 DIAGNOSIS — H903 Sensorineural hearing loss, bilateral: Secondary | ICD-10-CM | POA: Diagnosis not present

## 2015-09-05 DIAGNOSIS — M5412 Radiculopathy, cervical region: Secondary | ICD-10-CM | POA: Diagnosis not present

## 2015-09-05 DIAGNOSIS — M5010 Cervical disc disorder with radiculopathy, unspecified cervical region: Secondary | ICD-10-CM | POA: Diagnosis not present

## 2015-09-07 DIAGNOSIS — Z0000 Encounter for general adult medical examination without abnormal findings: Secondary | ICD-10-CM | POA: Diagnosis not present

## 2015-09-07 DIAGNOSIS — C678 Malignant neoplasm of overlapping sites of bladder: Secondary | ICD-10-CM | POA: Diagnosis not present

## 2015-09-10 ENCOUNTER — Encounter: Payer: Self-pay | Admitting: Internal Medicine

## 2015-09-10 ENCOUNTER — Ambulatory Visit (INDEPENDENT_AMBULATORY_CARE_PROVIDER_SITE_OTHER): Payer: Medicare Other | Admitting: Internal Medicine

## 2015-09-10 VITALS — BP 136/70 | HR 96 | Temp 98.1°F | Wt 104.0 lb

## 2015-09-10 DIAGNOSIS — C673 Malignant neoplasm of anterior wall of bladder: Secondary | ICD-10-CM | POA: Diagnosis not present

## 2015-09-10 DIAGNOSIS — J209 Acute bronchitis, unspecified: Secondary | ICD-10-CM | POA: Diagnosis not present

## 2015-09-10 DIAGNOSIS — R5382 Chronic fatigue, unspecified: Secondary | ICD-10-CM

## 2015-09-10 DIAGNOSIS — R531 Weakness: Secondary | ICD-10-CM

## 2015-09-10 DIAGNOSIS — J449 Chronic obstructive pulmonary disease, unspecified: Secondary | ICD-10-CM

## 2015-09-10 MED ORDER — METHYLPREDNISOLONE ACETATE 80 MG/ML IJ SUSP
80.0000 mg | Freq: Once | INTRAMUSCULAR | Status: AC
  Administered 2015-09-10: 80 mg via INTRAMUSCULAR

## 2015-09-10 NOTE — Assessment & Plan Note (Signed)
worse x1 mo post cystoscopy in 4/17 UA was ok 1 wk ago

## 2015-09-10 NOTE — Progress Notes (Signed)
Subjective:  Patient ID: Amber Snow, female    DOB: 09/08/1932  Age: 82 y.o. MRN: [id number]  CC: No chief complaint on file.   HPI Nitasha Guia Marple presents for COPD, bladder ca, HTN f/u. C/o weakness after cystoscopy in 4/17  Outpatient Prescriptions Prior to Visit  Medication Sig Dispense Refill  . acetaminophen (TYLENOL) 500 MG tablet Take 1,000 mg by mouth every 4 (four) hours as needed for moderate pain.    Marland Kitchen albuterol (PROAIR HFA) 108 (90 BASE) MCG/ACT inhaler Inhale 1-2 puffs into the lungs every 4 (four) hours as needed for wheezing or shortness of breath. 1 Inhaler 10  . ALPRAZolam (XANAX) 0.25 MG tablet Take 0.5-1 tablets (0.125-0.25 mg total) by mouth 2 (two) times daily as needed for anxiety (shortness of breath). 60 tablet 3  . baclofen (LIORESAL) 10 MG tablet Take 10 mg by mouth 3 (three) times daily as needed for muscle spasms.     Marland Kitchen BIOTIN PO Take 1 capsule by mouth daily.    . Cholecalciferol (VITAMIN D) 1000 UNITS capsule Take 1,000 Units by mouth daily.     . clotrimazole (MYCELEX) 10 MG troche Take 1 tablet (10 mg total) by mouth 5 (five) times daily. 35 tablet 0  . famotidine (PEPCID) 20 MG tablet Take 20 mg by mouth 2 (two) times daily.    . furosemide (LASIX) 20 MG tablet Take 1 tablet (20 mg total) by mouth daily as needed for edema. 30 tablet 5  . ibuprofen (ADVIL,MOTRIN) 200 MG tablet Take 600-800 mg by mouth every 6 (six) hours as needed for moderate pain.    Marland Kitchen losartan (COZAAR) 50 MG tablet TAKE 1 TABLET BY MOUTH AT BEDTIME 90 tablet 2  . SYMBICORT 160-4.5 MCG/ACT inhaler INHALE 2 PUFFS INTO THE LUNGS TWICE A DAY 10.2 Inhaler 9  . traZODone (DESYREL) 50 MG tablet Take 50 mg by mouth at bedtime.     . traZODone (DESYREL) 50 MG tablet TAKE 1 TABLET BY MOUTH EVERY DAY 90 tablet 2  . zoledronic acid (RECLAST) 5 MG/100ML SOLN Inject 5 mg into the vein once. Every year    . doxycycline (VIBRA-TABS) 100 MG tablet Take 1 tablet (100 mg total) by mouth 2 (two)  times daily. 14 tablet 0   No facility-administered medications prior to visit.    ROS Review of Systems  Constitutional: Positive for fatigue and unexpected weight change. Negative for chills, activity change and appetite change.  HENT: Negative for congestion, mouth sores and sinus pressure.   Eyes: Negative for visual disturbance.  Respiratory: Positive for shortness of breath. Negative for cough and chest tightness.   Gastrointestinal: Positive for abdominal pain and abdominal distention. Negative for nausea.  Genitourinary: Negative for frequency, difficulty urinating and vaginal pain.  Musculoskeletal: Positive for arthralgias. Negative for back pain and gait problem.  Skin: Negative for pallor and rash.  Neurological: Negative for dizziness, tremors, weakness, numbness and headaches.  Psychiatric/Behavioral: Positive for sleep disturbance. Negative for suicidal ideas, confusion, dysphoric mood and decreased concentration.    Objective:  BP 136/70 mmHg  Pulse 96  Temp(Src) 98.1 F (36.7 C) (Oral)  Wt 104 lb (47.174 kg)  SpO2 97%  BP Readings from Last 3 Encounters:  09/10/15 136/70  08/09/15 132/74  07/31/15 112/52    Wt Readings from Last 3 Encounters:  09/10/15 104 lb (47.174 kg)  08/09/15 109 lb (49.442 kg)  07/31/15 112 lb 3.2 oz (50.894 kg)    Physical Exam  Constitutional: She  appears well-developed. No distress.  HENT:  Head: Normocephalic.  Right Ear: External ear normal.  Left Ear: External ear normal.  Nose: Nose normal.  Mouth/Throat: Oropharynx is clear and moist.  Eyes: Conjunctivae are normal. Pupils are equal, round, and reactive to light. Right eye exhibits no discharge. Left eye exhibits no discharge.  Neck: Normal range of motion. Neck supple. No JVD present. No tracheal deviation present. No thyromegaly present.  Cardiovascular: Regular rhythm and normal heart sounds.  Exam reveals no gallop.   Pulmonary/Chest: No stridor. No respiratory  distress. She has no wheezes.  Abdominal: Soft. Bowel sounds are normal. She exhibits no distension and no mass. There is no tenderness. There is no rebound and no guarding.  Musculoskeletal: She exhibits no edema or tenderness.  Lymphadenopathy:    She has no cervical adenopathy.  Neurological: She displays normal reflexes. No cranial nerve deficit. She exhibits normal muscle tone. Coordination normal.  Skin: No rash noted. No erythema.  Psychiatric: She has a normal mood and affect. Her behavior is normal. Judgment and thought content normal.   NAD, thin Tachycardic  Lab Results  Component Value Date   WBC 10.5 07/30/2015   HGB 13.5 07/30/2015   HCT 40.4 07/30/2015   PLT 360 07/30/2015   GLUCOSE 95 07/30/2015   CHOL 221* 07/06/2013   TRIG 200.0* 07/06/2013   HDL 60.90 07/06/2013   LDLDIRECT 126.5 06/14/2012   LDLCALC 120* 07/06/2013   ALT 13 02/21/2015   AST 18 02/21/2015   NA 141 07/30/2015   K 4.0 07/30/2015   CL 107 07/30/2015   CREATININE 0.94 07/30/2015   BUN 19 07/30/2015   CO2 25 07/30/2015   TSH 1.21 11/08/2014   INR 1.0 09/04/2011   HGBA1C 5.8* 06/04/2012    Mr Cervical Spine Wo Contrast  08/19/2015  CLINICAL DATA:  82 year old female with cervical neck pain. Subsequent encounter. EXAM: MRI CERVICAL SPINE WITHOUT CONTRAST TECHNIQUE: Multiplanar, multisequence MR imaging of the cervical spine was performed. No intravenous contrast was administered. COMPARISON:  [facility] cervical spine MRI 05/14/2012. FINDINGS: Mild straightening of cervical lordosis today. Since 2014 mild retrolisthesis of C5 on C6 is more apparent. Otherwise stable vertebral height and alignment. No marrow edema or evidence of acute osseous abnormality. Negative visualized posterior fossa structures. Cervicomedullary junction is within normal limits. Spinal cord signal is within normal limits at all visualized levels. Negative paraspinal soft tissues. C2-C3:  Negative.  C3-C4: Chronic circumferential disc bulge with increased broad-based posterior disc extrusion since 2014 (series 5, image 6). Mild ligament flavum hypertrophy again noted. Still, borderline to mild spinal stenosis is increased. No spinal cord mass effect. Moderate facet hypertrophy greater on the left combined with disc and uncovertebral hypertrophy moderate to severe bilateral C4 foraminal stenosis there is which is stable. C4-C5: Negative disc. Chronic moderate facet hypertrophy on the left. Chronic moderate to severe left C5 foraminal stenosis is stable. C5-C6: Chronic but mildly increased retrolisthesis. Circumferential disc bulge is stable while ligament flavum hypertrophy has regressed. Mild facet hypertrophy. Mild to moderate left C6 foraminal stenosis has increased. C6-C7: Chronic but increased right paracentral broad-based disc protrusion best seen on series 5, image 5 and series 7 image 17 (compare to series 6, image 20 in 2014). Stable ligament flavum hypertrophy. Effaced ventral CSF space, but without significant spinal stenosis. Mild facet hypertrophy. No foraminal stenosis. C7-T1: Small chronic central right paracentral disc protrusion appears stable. No spinal stenosis. Mild to moderate facet hypertrophy greater on the right. No foraminal stenosis.  T1-T2: New central to right paracentral disc extrusion (series 5, image 6 and series 8, image 24). Narrowing of the ventral CSF space and borderline to mild spinal stenosis. No significant cord mass effect. No foraminal involvement. No other visualized upper thoracic spinal stenosis. IMPRESSION: 1. Progressed retrolisthesis at C5-C6 and disc degeneration at C3-C4 and C6-C7 since 2014. Subsequent increased mild spinal stenosis at C3-C4 with no spinal cord mass effect, and increased mild to moderate left C6 neural foraminal stenosis. 2. New or increased T1-T2 disc herniation with new mild spinal stenosis. 3. Stable degenerative moderate to severe neural  foraminal stenosis at the bilateral C4 and C5 nerve levels. Electronically Signed   By: Genevie Ann M.D.   On: 08/19/2015 15:30    Assessment & Plan:   Diagnoses and all orders for this visit:  Chronic fatigue  Malignant neoplasm of anterior wall of urinary bladder (HCC)  COPD GOLD II   Acute bronchitis, unspecified organism   I have discontinued Ms. Labat's doxycycline. I am also having her maintain her Vitamin D, BIOTIN PO, zoledronic acid, baclofen, ALPRAZolam, furosemide, ibuprofen, acetaminophen, albuterol, traZODone, losartan, famotidine, SYMBICORT, traZODone, and clotrimazole.  No orders of the defined types were placed in this encounter.     Follow-up: No Follow-up on file.  Walker Kehr, MD

## 2015-09-10 NOTE — Progress Notes (Signed)
Pre visit review using our clinic review tool, if applicable. No additional management support is needed unless otherwise documented below in the visit note. 

## 2015-09-10 NOTE — Assessment & Plan Note (Signed)
Depo-medrol 80 mg IM 

## 2015-09-10 NOTE — Assessment & Plan Note (Signed)
Pt saw Dr Junious Silk 1 wk ago Failed chemotherapy with BCG instillations 2017 worse x1 mo post cystoscopy in 4/17 - chemo/XRT were discussed w/Pt

## 2015-09-10 NOTE — Addendum Note (Signed)
Addended by: Cresenciano Lick on: 09/10/2015 03:13 PM   Modules accepted: Orders

## 2015-09-10 NOTE — Assessment & Plan Note (Addendum)
Chronic SOB Just finished an abx in 4/17 5/17 On Proair, Symbicort. Dr Melvyn Novas has stopped Spiriva due to tongue swelling (per pt) Depo-medrol 80 mg IM

## 2015-09-10 NOTE — Assessment & Plan Note (Signed)
Just finished an abx in 4/17

## 2015-09-28 ENCOUNTER — Ambulatory Visit (INDEPENDENT_AMBULATORY_CARE_PROVIDER_SITE_OTHER): Payer: Medicare Other | Admitting: Cardiology

## 2015-09-28 ENCOUNTER — Encounter: Payer: Self-pay | Admitting: Cardiology

## 2015-09-28 VITALS — BP 144/72 | HR 94 | Ht 63.0 in | Wt 102.0 lb

## 2015-09-28 DIAGNOSIS — I5022 Chronic systolic (congestive) heart failure: Secondary | ICD-10-CM

## 2015-09-28 DIAGNOSIS — I2510 Atherosclerotic heart disease of native coronary artery without angina pectoris: Secondary | ICD-10-CM

## 2015-09-28 DIAGNOSIS — J449 Chronic obstructive pulmonary disease, unspecified: Secondary | ICD-10-CM

## 2015-09-28 NOTE — Patient Instructions (Signed)
Continue your current therapy  I will see you in one year   

## 2015-09-28 NOTE — Progress Notes (Signed)
Amber Snow Date of Birth: 1933-01-11 Medical Record [id number]  History of Present Illness: Amber Snow is seen back today for a follow up cardiomyopathy. Has a history of nonischemic CM with EF of 35 to 40% by cath and echo from May of 2013. Nuclear at that time showed an EF of 27%. Echo in November of 2015 showed EF was 235-40%. She has had minimal nonobstructive disease by cardiac cath in May 2013. Other issues include COPD, past smoker, HTN, PVD, HLD, and bladder CA.   She had to stop taking aldactone due to leg cramps and bisoprolol due to persistent lightheadedness and COPD. These symptoms have resolved. She still has some dyspnea and chest tightness related to COPD. She developed oral thrush on steroids and Spiriva. This resulted in a 10 lb weight loss. Today this is resolved.   Current Outpatient Prescriptions  Medication Sig Dispense Refill  . acetaminophen (TYLENOL) 500 MG tablet Take 1,000 mg by mouth every 4 (four) hours as needed for moderate pain.    Marland Kitchen albuterol (PROAIR HFA) 108 (90 BASE) MCG/ACT inhaler Inhale 1-2 puffs into the lungs every 4 (four) hours as needed for wheezing or shortness of breath. 1 Inhaler 10  . ALPRAZolam (XANAX) 0.25 MG tablet Take 0.5-1 tablets (0.125-0.25 mg total) by mouth 2 (two) times daily as needed for anxiety (shortness of breath). 60 tablet 3  . baclofen (LIORESAL) 10 MG tablet Take 10 mg by mouth 3 (three) times daily as needed for muscle spasms.     Marland Kitchen BIOTIN PO Take 1 capsule by mouth daily.    . Cholecalciferol (VITAMIN D) 1000 UNITS capsule Take 1,000 Units by mouth daily.     . famotidine (PEPCID) 20 MG tablet Take 20 mg by mouth 2 (two) times daily.    . furosemide (LASIX) 20 MG tablet Take 1 tablet (20 mg total) by mouth daily as needed for edema. 30 tablet 5  . ibuprofen (ADVIL,MOTRIN) 200 MG tablet Take 600-800 mg by mouth every 6 (six) hours as needed for moderate pain.    Marland Kitchen losartan (COZAAR) 50 MG tablet TAKE 1 TABLET BY MOUTH AT  BEDTIME 90 tablet 2  . SYMBICORT 160-4.5 MCG/ACT inhaler INHALE 2 PUFFS INTO THE LUNGS TWICE A DAY 10.2 Inhaler 9  . traZODone (DESYREL) 50 MG tablet Take 50 mg by mouth at bedtime.     . traZODone (DESYREL) 50 MG tablet TAKE 1 TABLET BY MOUTH EVERY DAY 90 tablet 2  . zoledronic acid (RECLAST) 5 MG/100ML SOLN Inject 5 mg into the vein once. Every year     No current facility-administered medications for this visit.    Allergies  Allergen Reactions  . Barbiturates Rash  . Penicillins Rash    Has patient had a PCN reaction causing immediate rash, facial/tongue/throat swelling, SOB or lightheadedness with hypotension: Yes  Has patient had a PCN reaction causing severe rash involving mucus membranes or skin necrosis: Yes. Itchy Has patient had a PCN reaction that required hospitalization No Has patient had a PCN reaction occurring within the last 10 years: No If all of the above answers are "NO", then may proceed with Cephalosporin use.   . Shrimp [Shellfish Allergy] Swelling  . Anoro Ellipta [Umeclidinium-Vilanterol]     cough  . Aspirin Nausea Only  . Beta Adrenergic Blockers     PT STATES TOLD TO AVOID DUE TO COUGH REACTION TO CARDIZEM  . Bisoprolol     SOB and dizzy  . Calcium Channel Blockers  PER PT TOLD TO AVOID DUE TO COUGH REACTION TO CARDIZEM  . Celecoxib Nausea Only  . Ciprofloxacin Other (See Comments)    Red streaks on arm when given IV  . Codeine Other (See Comments)    REACTION: excessive sleep  . Diltiazem Hcl Other (See Comments)    REACTION: cough  . Fluvastatin Sodium     Hair fell out, finger nails fell off   . Ibandronate Sodium Other (See Comments)    REACTION: HAIR LOSS/NAIL CHANGES  . Oxycodone-Acetaminophen Nausea Only  . Plavix [Clopidogrel Bisulfate] Itching  . Rosuvastatin     Hair fell out, finger nails fell off   . Simvastatin     Hair fell out, finger nails fell off   . Tramadol Nausea And Vomiting    Past Medical History  Diagnosis Date   . Diverticulosis of colon   . Hyperlipidemia   . History of nephrolithiasis   . Osteopenia   . Arthritis HANDS  . Chronic systolic CHF (congestive heart failure) ([place])   . Renal calculus, bilateral   . Bladder cancer ([place])   . Bladder calculus   . Wears hearing aid     BILATERAL  . Nonischemic cardiomyopathy ([place])   . PAC (premature atrial contraction)   . Dyspnea on exertion   . Nodule of right lung     APICAL 1.0CM (MONITORED BY DR Melvyn Novas)  . Myalgia   . LBBB (left bundle branch block)     CHRONIC  . Pneumonia 1-2 YEARS AGO  . Nocturia   . Neck pain     07-30-15 at present and flares periodically"? stenosis"  . COPD, moderate ([place]) PULMOLOGIST-- DR Melvyn Novas    W/ EMPHYSEMA  (COPD  GOLD II)- Dr. Melvyn Novas follows  . Acute exacerbation of chronic obstructive pulmonary disease (COPD) ([place])     ON PREDNISONE INCREASED DOSE  FOR LAST WEEK  . CAD (coronary artery disease) CARDIOLOGSIT-- DR Martinique    05-17-2013minimal nonobstructive CAD per cath with EF of 35 to 40% and normal filling pressures and right heart pressures-Dr. Martinique follows    Past Surgical History  Procedure Laterality Date  . Cosmetic surgery      eyelids  . Left long finger arthroplasty & pulley relase  02-17-2005  . Cysto/ left retrograde pyelogram/ left ureteral stent placement  10-16-2001    STONE  . Pulmonary function analysis  07-31-2009    MODERATE TO SEVERE OBSTRUCTIVE AIRWAY DISEASE/ INSIGNIFICANT RESPONSE TO BRONCHODILATORS/ EMPHYSEMA PATTERN/ MILD DECREASE IN DIFFUSE CAPACITY FEF 25-75 CHANGED BY 8%  . Tubal ligation  AGE 78'S  . Tonsillectomy  CHILD  . Extracorporeal shock wave lithotripsy  2003  . Cataract extraction w/ intraocular lens  implant, bilateral  1998  . Transurethral resection of bladder tumor  07/01/2011    Procedure: TRANSURETHRAL RESECTION OF BLADDER TUMOR (TURBT);  Surgeon: Hanley Ben, MD;  Location: [facility];  Service: Urology;  Laterality: N/A;  CYSTO  . Cystoscopy   07/01/2011    Procedure: CYSTOSCOPY;  Surgeon: Hanley Ben, MD;  Location: [facility];  Service: Urology;  Laterality: N/A;  retrieval l of bladder stone  . Orif finger / thumb fracture    . Cystoscopy N/A 08/20/2012    Procedure: CYSTOSCOPY;  Surgeon: Hanley Ben, MD;  Location: [facility];  Service: Urology;  Laterality: N/A;  fulgeration of bladderm tumors  . Cardiac catheterization  09-12-2011  DR Martinique    MINIMAL NONOBSTRUCTIVE CAD/ MODERATE TO SEVERE LV  DYSFUNCTION/  NORMAL RIGHT HEART PRESSURES AND LV FILLING PRESSURES  . Transthoracic echocardiogram  last one 03-15-2014  DR Martinique    GRADE I DIASTOLIC DYSFUNCTION/  confirmed by dr Liane Comber  EF 45% (down from 60-65% 03-12-2013 but up from 25-30% compared to last echo 10-01-2012 /  NORMAL LV SIZE AND WALL THICKNESS/  trivial TR  . Cystoscopy with litholapaxy N/A 04/01/2013    Procedure: CYSTOSCOPY WITH HOLMIUM LASER OF  BLADDER CALCULUS;  Surgeon: Hanley Ben, MD;  Location: [place];  Service: Urology;  Laterality: N/A;  . Holmium laser application N/A [id number]    Procedure: HOLMIUM LASER APPLICATION;  Surgeon: Hanley Ben, MD;  Location: [place];  Service: Urology;  Laterality: N/A;  . Cystoscopy w/ retrogrades Right 04/01/2013    Procedure: CYSTOSCOPY WITH RETROGRADE PYELOGRAM;  Surgeon: Hanley Ben, MD;  Location: [place];  Service: Urology;  Laterality: Right;  . Left and right heart catheterization with coronary angiogram Bilateral 09/12/2011    Procedure: LEFT AND RIGHT HEART CATHETERIZATION WITH CORONARY ANGIOGRAM;  Surgeon: Jackalyn Haith M Martinique, MD;  Location: [facility];  Service: Cardiovascular;  Laterality: Bilateral;  . Cystoscopy N/A 06/23/2014    Procedure: CYSTOSCOPY;  Surgeon: Arvil Persons, MD;  Location: [facility];  Service: Urology;  Laterality: N/A;  . Fulguration of bladder tumor  06/23/2014    Procedure:  FULGURATION OF BLADDER TUMOR;  Surgeon: Arvil Persons, MD;  Location: [facility];  Service: Urology;;  . Tooth extraction  01-17-15    LOWER TOOTH CROWN  BROKE OFF AND WAS EXTRACTED  . Cystoscopy with biopsy N/A 02/06/2015    Procedure: CYSTO WITH BLADDER BIOPSY;  Surgeon: Festus Aloe, MD;  Location: [facility];  Service: Urology;  Laterality: N/A;  . Fulguration of bladder tumor N/A 02/06/2015    Procedure: FULGURATION OF BLADDER TUMORS;  Surgeon: Festus Aloe, MD;  Location: [facility];  Service: Urology;  Laterality: N/A;  . Transurethral resection of bladder tumor N/A 02/06/2015    Procedure:  TRANSURETHRAL RESECTION OF BLADDER TUMOR (TURBT) GREATER THAN 5 CM; RIGHT RETROGRADE, RIGHT STENT PLACEMENT;  Surgeon: Festus Aloe, MD;  Location: [facility];  Service: Urology;  Laterality: N/A;  . Cystoscopy with retrograde pyelogram, ureteroscopy and stent placement Bilateral 07/31/2015    Procedure: CYSTOSCOPY WITH FULGERATION BLADDER BIOPSY, CYSTOSCOPY WITH BILATERAL RETROGRADE PYELOGRAMS;  Surgeon: Festus Aloe, MD;  Location: [facility];  Service: Urology;  Laterality: Bilateral;  . Transurethral resection of bladder tumor N/A 07/31/2015    Procedure:  TRANSURETHRAL RESECTION OF BLADDER TUMOR (TURBT); REMOVAL OF DYSTROPHIC CALCIFICATIONS;  Surgeon: Festus Aloe, MD;  Location: [facility];  Service: Urology;  Laterality: N/A;    History  Smoking status  . Former Smoker -- 1.00 packs/day for 50 years  . Types: Cigarettes  . Quit date: 09/27/2011  Smokeless tobacco  . Never Used    History  Alcohol Use No    Family History  Problem Relation Age of Onset  . Heart disease Mother   . Hypertension Mother   . Hypertension Father   . Heart attack Father   . Heart disease Father   . Thyroid disease Sister   . Heart attack Sister   . Stroke Sister   . COPD Sister   . Heart attack Brother   . Cancer Maternal Grandmother     breast cancer  . Diabetes Paternal Grandmother   .  Asthma Neg Hx     Review of  Systems: The review of systems is per the HPI.  All other systems were reviewed and are negative.  Physical Exam: BP 144/72 mmHg  Pulse 94  Ht 5\' 3"  (1.6 m)  Wt 46.267 kg (102 lb)  BMI 18.07 kg/m2 No orthostatic changes. Patient is very pleasant and is in moderate respiratory distress. Skin is warm and dry. Color is normal.  HEENT is unremarkable.  Neck is supple. No masses. No JVD. Lungs  with diffusely diminished BS. Cardiac exam shows a regular rate and rhythm. Normal S1-2. No gallop or murmur. Abdomen is soft. Extremities are without edema. Gait and ROM are intact. No gross neurologic deficits noted.  LABORATORY DATA:   Lab Results  Component Value Date   WBC 10.5 07/30/2015   HGB 13.5 07/30/2015   HCT 40.4 07/30/2015   PLT 360 07/30/2015   GLUCOSE 95 07/30/2015   CHOL 221* 07/06/2013   TRIG 200.0* 07/06/2013   HDL 60.90 07/06/2013   LDLDIRECT 126.5 06/14/2012   LDLCALC 120* 07/06/2013   ALT 13 02/21/2015   AST 18 02/21/2015   NA 141 07/30/2015   K 4.0 07/30/2015   CL 107 07/30/2015   CREATININE 0.94 07/30/2015   BUN 19 07/30/2015   CO2 25 07/30/2015   TSH 1.21 11/08/2014   INR 1.0 09/04/2011   HGBA1C 5.8* 06/04/2012   Echo Study Conclusions from November 2015  Study Conclusions  - Left ventricle: The cavity size was normal. Wall thickness was normal. Systolic function was moderately reduced. The estimated ejection fraction was in the range of 35% to 40%. Doppler parameters are consistent with abnormal left ventricular relaxation (grade 1 diastolic dysfunction). - Ventricular septum: Septal motion showed abnormal function and dyssynergy.   Assessment / Plan:   1. Nonischemic CM with chronic systolic heart failure - EF 35-40% by Echo in November 2015. - Now just on losartan. Other meds stopped due to side effects. I think her current symptoms are related more to COPD.  2. Chronic LBBB   3. CAD - nonobstructive per  last cath - managed medically - no symptoms.   4. COPD followed by Dr. Melvyn Novas.   I will follow up in one year.

## 2015-10-09 ENCOUNTER — Ambulatory Visit (INDEPENDENT_AMBULATORY_CARE_PROVIDER_SITE_OTHER): Payer: Medicare Other | Admitting: Internal Medicine

## 2015-10-09 ENCOUNTER — Encounter: Payer: Self-pay | Admitting: Internal Medicine

## 2015-10-09 VITALS — BP 140/72 | HR 91 | Wt 101.0 lb

## 2015-10-09 DIAGNOSIS — I2510 Atherosclerotic heart disease of native coronary artery without angina pectoris: Secondary | ICD-10-CM | POA: Diagnosis not present

## 2015-10-09 DIAGNOSIS — M5442 Lumbago with sciatica, left side: Secondary | ICD-10-CM | POA: Diagnosis not present

## 2015-10-09 DIAGNOSIS — J449 Chronic obstructive pulmonary disease, unspecified: Secondary | ICD-10-CM | POA: Diagnosis not present

## 2015-10-09 DIAGNOSIS — I5022 Chronic systolic (congestive) heart failure: Secondary | ICD-10-CM | POA: Diagnosis not present

## 2015-10-09 DIAGNOSIS — G8929 Other chronic pain: Secondary | ICD-10-CM

## 2015-10-09 DIAGNOSIS — Z23 Encounter for immunization: Secondary | ICD-10-CM

## 2015-10-09 DIAGNOSIS — C673 Malignant neoplasm of anterior wall of bladder: Secondary | ICD-10-CM

## 2015-10-09 DIAGNOSIS — M5441 Lumbago with sciatica, right side: Secondary | ICD-10-CM

## 2015-10-09 NOTE — Assessment & Plan Note (Signed)
F/u w/Dr Junious Silk

## 2015-10-09 NOTE — Assessment & Plan Note (Signed)
F/u w/Dr Ernestina Patches  Advil prn

## 2015-10-09 NOTE — Assessment & Plan Note (Signed)
5/17 On Proair, Symbicort

## 2015-10-09 NOTE — Assessment & Plan Note (Signed)
S/p recent visit w/Dr Martinique

## 2015-10-09 NOTE — Assessment & Plan Note (Signed)
Losartan 

## 2015-10-09 NOTE — Progress Notes (Signed)
Subjective:  Patient ID: Amber Snow, female    DOB: 1932/09/17  Age: 82 y.o. MRN: [id number]  CC: No chief complaint on file.   HPI Amber Snow presents for DOE, COPD, CHF f/u. She is worried about her bladder cancer  Outpatient Prescriptions Prior to Visit  Medication Sig Dispense Refill  . acetaminophen (TYLENOL) 500 MG tablet Take 1,000 mg by mouth every 4 (four) hours as needed for moderate pain.    Marland Kitchen albuterol (PROAIR HFA) 108 (90 BASE) MCG/ACT inhaler Inhale 1-2 puffs into the lungs every 4 (four) hours as needed for wheezing or shortness of breath. 1 Inhaler 10  . ALPRAZolam (XANAX) 0.25 MG tablet Take 0.5-1 tablets (0.125-0.25 mg total) by mouth 2 (two) times daily as needed for anxiety (shortness of breath). 60 tablet 3  . baclofen (LIORESAL) 10 MG tablet Take 10 mg by mouth 3 (three) times daily as needed for muscle spasms.     Marland Kitchen BIOTIN PO Take 1 capsule by mouth daily.    . Cholecalciferol (VITAMIN D) 1000 UNITS capsule Take 1,000 Units by mouth daily.     . famotidine (PEPCID) 20 MG tablet Take 20 mg by mouth 2 (two) times daily.    . furosemide (LASIX) 20 MG tablet Take 1 tablet (20 mg total) by mouth daily as needed for edema. 30 tablet 5  . ibuprofen (ADVIL,MOTRIN) 200 MG tablet Take 600-800 mg by mouth every 6 (six) hours as needed for moderate pain.    Marland Kitchen losartan (COZAAR) 50 MG tablet TAKE 1 TABLET BY MOUTH AT BEDTIME 90 tablet 2  . SYMBICORT 160-4.5 MCG/ACT inhaler INHALE 2 PUFFS INTO THE LUNGS TWICE A DAY 10.2 Inhaler 9  . traZODone (DESYREL) 50 MG tablet Take 50 mg by mouth at bedtime.     . traZODone (DESYREL) 50 MG tablet TAKE 1 TABLET BY MOUTH EVERY DAY 90 tablet 2  . zoledronic acid (RECLAST) 5 MG/100ML SOLN Inject 5 mg into the vein once. Every year     No facility-administered medications prior to visit.    ROS Review of Systems  Constitutional: Positive for fatigue. Negative for chills, activity change, appetite change and unexpected weight  change.  HENT: Negative for congestion, mouth sores and sinus pressure.   Eyes: Negative for visual disturbance.  Respiratory: Positive for shortness of breath. Negative for cough and chest tightness.   Cardiovascular: Negative for chest pain.  Gastrointestinal: Negative for nausea and abdominal pain.  Genitourinary: Negative for frequency, difficulty urinating and vaginal pain.  Musculoskeletal: Negative for back pain and gait problem.  Skin: Negative for pallor and rash.  Neurological: Negative for dizziness, tremors, weakness, numbness and headaches.  Psychiatric/Behavioral: Negative for confusion and sleep disturbance.    Objective:  BP 140/72 mmHg  Pulse 91  Wt 101 lb (45.813 kg)  SpO2 97%  BP Readings from Last 3 Encounters:  10/09/15 140/72  09/28/15 144/72  09/10/15 136/70    Wt Readings from Last 3 Encounters:  10/09/15 101 lb (45.813 kg)  09/28/15 102 lb (46.267 kg)  09/10/15 104 lb (47.174 kg)    Physical Exam  Constitutional: She appears well-developed. No distress.  HENT:  Head: Normocephalic.  Right Ear: External ear normal.  Left Ear: External ear normal.  Nose: Nose normal.  Mouth/Throat: Oropharynx is clear and moist.  Eyes: Conjunctivae are normal. Pupils are equal, round, and reactive to light. Right eye exhibits no discharge. Left eye exhibits no discharge.  Neck: Normal range of motion. Neck supple.  No JVD present. No tracheal deviation present. No thyromegaly present.  Cardiovascular: Normal rate, regular rhythm and normal heart sounds.   Pulmonary/Chest: No stridor. No respiratory distress. She has no wheezes.  Abdominal: Soft. Bowel sounds are normal. She exhibits no distension and no mass. There is no tenderness. There is no rebound and no guarding.  Musculoskeletal: She exhibits no edema or tenderness.  Lymphadenopathy:    She has no cervical adenopathy.  Neurological: She displays normal reflexes. No cranial nerve deficit. She exhibits normal  muscle tone. Coordination normal.  Skin: No rash noted. No erythema.  Psychiatric: She has a normal mood and affect. Her behavior is normal. Judgment and thought content normal.    Lab Results  Component Value Date   WBC 10.5 07/30/2015   HGB 13.5 07/30/2015   HCT 40.4 07/30/2015   PLT 360 07/30/2015   GLUCOSE 95 07/30/2015   CHOL 221* 07/06/2013   TRIG 200.0* 07/06/2013   HDL 60.90 07/06/2013   LDLDIRECT 126.5 06/14/2012   LDLCALC 120* 07/06/2013   ALT 13 02/21/2015   AST 18 02/21/2015   NA 141 07/30/2015   K 4.0 07/30/2015   CL 107 07/30/2015   CREATININE 0.94 07/30/2015   BUN 19 07/30/2015   CO2 25 07/30/2015   TSH 1.21 11/08/2014   INR 1.0 09/04/2011   HGBA1C 5.8* 06/04/2012    Mr Cervical Spine Wo Contrast  08/19/2015  CLINICAL DATA:  82 year old female with cervical neck pain. Subsequent encounter. EXAM: MRI CERVICAL SPINE WITHOUT CONTRAST TECHNIQUE: Multiplanar, multisequence MR imaging of the cervical spine was performed. No intravenous contrast was administered. COMPARISON:  [facility] cervical spine MRI 05/14/2012. FINDINGS: Mild straightening of cervical lordosis today. Since 2014 mild retrolisthesis of C5 on C6 is more apparent. Otherwise stable vertebral height and alignment. No marrow edema or evidence of acute osseous abnormality. Negative visualized posterior fossa structures. Cervicomedullary junction is within normal limits. Spinal cord signal is within normal limits at all visualized levels. Negative paraspinal soft tissues. C2-C3:  Negative. C3-C4: Chronic circumferential disc bulge with increased broad-based posterior disc extrusion since 2014 (series 5, image 6). Mild ligament flavum hypertrophy again noted. Still, borderline to mild spinal stenosis is increased. No spinal cord mass effect. Moderate facet hypertrophy greater on the left combined with disc and uncovertebral hypertrophy moderate to severe bilateral C4 foraminal stenosis  there is which is stable. C4-C5: Negative disc. Chronic moderate facet hypertrophy on the left. Chronic moderate to severe left C5 foraminal stenosis is stable. C5-C6: Chronic but mildly increased retrolisthesis. Circumferential disc bulge is stable while ligament flavum hypertrophy has regressed. Mild facet hypertrophy. Mild to moderate left C6 foraminal stenosis has increased. C6-C7: Chronic but increased right paracentral broad-based disc protrusion best seen on series 5, image 5 and series 7 image 17 (compare to series 6, image 20 in 2014). Stable ligament flavum hypertrophy. Effaced ventral CSF space, but without significant spinal stenosis. Mild facet hypertrophy. No foraminal stenosis. C7-T1: Small chronic central right paracentral disc protrusion appears stable. No spinal stenosis. Mild to moderate facet hypertrophy greater on the right. No foraminal stenosis. T1-T2: New central to right paracentral disc extrusion (series 5, image 6 and series 8, image 24). Narrowing of the ventral CSF space and borderline to mild spinal stenosis. No significant cord mass effect. No foraminal involvement. No other visualized upper thoracic spinal stenosis. IMPRESSION: 1. Progressed retrolisthesis at C5-C6 and disc degeneration at C3-C4 and C6-C7 since 2014. Subsequent increased mild spinal stenosis at C3-C4 with no spinal  cord mass effect, and increased mild to moderate left C6 neural foraminal stenosis. 2. New or increased T1-T2 disc herniation with new mild spinal stenosis. 3. Stable degenerative moderate to severe neural foraminal stenosis at the bilateral C4 and C5 nerve levels. Electronically Signed   By: Genevie Ann M.D.   On: 08/19/2015 15:30    Assessment & Plan:   There are no diagnoses linked to this encounter. I am having Ms. Dearmas maintain her Vitamin D, BIOTIN PO, zoledronic acid, baclofen, ALPRAZolam, furosemide, ibuprofen, acetaminophen, albuterol, traZODone, losartan, famotidine, SYMBICORT, and  traZODone.  No orders of the defined types were placed in this encounter.     Follow-up: No Follow-up on file.  Walker Kehr, MD

## 2015-10-09 NOTE — Progress Notes (Signed)
Pre visit review using our clinic review tool, if applicable. No additional management support is needed unless otherwise documented below in the visit note. 

## 2015-10-16 DIAGNOSIS — D3132 Benign neoplasm of left choroid: Secondary | ICD-10-CM | POA: Diagnosis not present

## 2015-10-16 DIAGNOSIS — H52203 Unspecified astigmatism, bilateral: Secondary | ICD-10-CM | POA: Diagnosis not present

## 2015-10-16 DIAGNOSIS — Z961 Presence of intraocular lens: Secondary | ICD-10-CM | POA: Diagnosis not present

## 2015-10-18 DIAGNOSIS — M47816 Spondylosis without myelopathy or radiculopathy, lumbar region: Secondary | ICD-10-CM | POA: Diagnosis not present

## 2015-10-18 DIAGNOSIS — M5415 Radiculopathy, thoracolumbar region: Secondary | ICD-10-CM | POA: Diagnosis not present

## 2015-10-18 DIAGNOSIS — M419 Scoliosis, unspecified: Secondary | ICD-10-CM | POA: Diagnosis not present

## 2015-11-01 DIAGNOSIS — H0012 Chalazion right lower eyelid: Secondary | ICD-10-CM | POA: Diagnosis not present

## 2015-11-08 ENCOUNTER — Ambulatory Visit (INDEPENDENT_AMBULATORY_CARE_PROVIDER_SITE_OTHER): Payer: Medicare Other | Admitting: Internal Medicine

## 2015-11-08 ENCOUNTER — Encounter: Payer: Self-pay | Admitting: Internal Medicine

## 2015-11-08 VITALS — BP 124/70 | HR 83 | Ht 63.0 in | Wt 103.0 lb

## 2015-11-08 DIAGNOSIS — J449 Chronic obstructive pulmonary disease, unspecified: Secondary | ICD-10-CM | POA: Diagnosis not present

## 2015-11-08 DIAGNOSIS — I10 Essential (primary) hypertension: Secondary | ICD-10-CM | POA: Diagnosis not present

## 2015-11-08 NOTE — Progress Notes (Signed)
Subjective:    Patient ID: Amber Snow, female    DOB: June 27, 1932  MRN: [id number]   Brief patient profile:  54  yowf with GOLD II copd by pfts 07/2009 quit smoking June 2013  due to progressive doe x 5 years worse since quit referred to pulmonary clinic 06/17/2012 by Dr Ignacia Palma    History of Present Illness  07/10/2015  f/u ov/[name] re: GOLD II/ could not afford bevespi and not convinced better than symbicort   Chief Complaint  Patient presents with  . Follow-up    Increased SOB x 2 wks. She has not felt well in general. She also c/o cough- mainly non prod. She is using proair once daily on average.   onset gradually worse x 2 weeks with any activity >  ? If better with albuterol ? Better with prednisone and feels worse off it in terms of energy but not cough/sob which is worse with use of arms. >>cont on Symbicort and Spiriva   08/09/2015 Follow up : [name]/COPD GOLD II  Pt returns for 1 month follow up and med review  Did not bring her meds today. She was upset that she is not seeing Dr. Melvyn Novas  Today.  Offered to reschedule x 2 but pt declined.  Complains of that her mouth is very sore.  Says can not tolerate Spiriva . Dries her out way too much , causes her tongue to be too sore.  Is taking Symbicort Twice daily  , says she rinses well after use.  Complains of 1 week of increased cough, congestion with green mucus. More sob than usual. No wheezing , chest pain, hemoptysis or fever.  Followed by Urology for bladder cancer . Had cystoscopy 07/31/15  Rec Doxycycline 100 mg twice daily for 1 week, take with food. Use sunscreen. If outside as this antibiotic may cause sunburn Mucinex DM twice daily as needed for cough and congestion. Mycelex lozenges 5 times daily for 1 week Continue on Symbicort 2 puffs twice daily, rinse after each use   11/08/2015  f/u ov/[name] re: GOLD II copd/ symbicort 160 2bid / no med calendar  Chief Complaint  Patient presents with  . Follow-up   Breathing is some worse- relates to humid weather. Cough is prod with white sputum. She has noticed increased wheezing. She is using albuterol once per wk on average.   sleeping ok/ note aecopd @ last ov and responded back to baseline  Ok walking indoors = MMRC2 = can't walk a nl pace on a flat grade s sob but does fine slow and flat eg [employer] but not mall  C/o swelling in legs/ dependent daytime despite cozaar as her bp med and using lasix 20 mg prn   No obvious day to day or daytime variability or assoc excess/ purulent sputum or mucus plugs or hemoptysis or cp or chest tightness, subjective wheeze or overt sinus or hb symptoms. No unusual exp hx or h/o childhood pna/ asthma or knowledge of premature birth.  Sleeping ok without nocturnal  or early am exacerbation  of respiratory  c/o's or need for noct saba. Also denies any obvious fluctuation of symptoms with weather or environmental changes or other aggravating or alleviating factors except as outlined above   Current Medications, Allergies, Complete Past Medical History, Past Surgical History, Family History, and Social History were reviewed in [employer] record.  ROS  The following are not active complaints unless bolded sore throat, dysphagia, dental problems, itching, sneezing,  nasal congestion or excess/ purulent secretions, ear ache,   fever, chills, sweats, unintended wt loss, classically pleuritic or exertional cp,  orthopnea pnd or leg swelling, presyncope, palpitations, abdominal pain, anorexia, nausea, vomiting, diarrhea  or change in bowel or bladder habits, change in stools or urine, dysuria,hematuria,  rash, arthralgias, visual complaints, headache, numbness, weakness or ataxia or problems with walking or coordination,  change in mood/affect or memory.                       Objective:   Physical Exam  Thin  amb anxious  wf nad  07/29/2012  105  >102 09/24/2012 > 10/26/2012  99 > 12/10/2012 99 > 104  03/14/2013 > 06/20/2013 107 > 109 07/28/2013 > 11/09/2013  109 > 11/10/2014   113 > 03/13/2015  113 > 04/10/2015   113 > 07/10/2015  114 > 11/08/2015   103      Vital signs reviewed    HEENT mild turbinate edema.  Oropharynx with dryness noted, post pharynx with few white scattered patches c/w thrush. .  No JVD or cervical adenopathy. Mild accessory muscle hypertrophy. Trachea midline, nl thryroid. Chest was hyperinflated by percussion with diminished breath sounds and moderate increased exp time without wheeze. Hoover sign positive at mid inspiration. Regular rate and rhythm without murmur gallop or rub or increase P2 - trace pitting edema both feet    Abd: no hsm, nl excursion. Ext warm without cyanosis or clubbing.                Assessment & Plan:

## 2015-11-08 NOTE — Patient Instructions (Signed)
Discuss with Dr Alain Marion add hctz to your cozar   No change in pulmonary medications   Please schedule a follow up visit in 6 months but call sooner if needed

## 2015-11-10 ENCOUNTER — Encounter: Payer: Self-pay | Admitting: Internal Medicine

## 2015-11-10 DIAGNOSIS — J441 Chronic obstructive pulmonary disease with (acute) exacerbation: Secondary | ICD-10-CM | POA: Insufficient documentation

## 2015-11-10 DIAGNOSIS — I10 Essential (primary) hypertension: Secondary | ICD-10-CM | POA: Insufficient documentation

## 2015-11-10 NOTE — Assessment & Plan Note (Addendum)
-   PFT's 07/31/09 FEV1  1.36 (74%) ratio 45 and no better p B2,  DLCO 66%  - quit smoking June 2013 - PFT's 07/29/2012 FEV1 1.10 (63%) ratio 46 and no change p B2 and DLCO 57%  - 10/26/2012  Walked RA x 3 laps @ 185 ft each stopped due to end of study, no desats - Trial off spiriva 03/14/2013 > rechallenged 06/20/12 due to worse doe > d/cd around 06/26/13 and no worse off it  - 03/13/2015 trained on respimat > 90% effecitve > changed to stiolto 2 pffs each am > preferred symbicort so 04/10/2015 rec bevespi trial - 04/10/2015  extensive coaching HFA effectiveness =    75%  - 04/10/2015  Walked RA x 3 laps @ 185 ft each stopped due to  End of study, nl pace, no sob or desat/ did c/o leg fatigue bilaterally  - 07/10/2015  extensive coaching HFA effectiveness =    75% try spiriva respimat 2 each am > unable to tolerate mouth effects so d/c'd 08/2015   Actually well compensated with most of her symptoms occuring when she overdoes it out in the heat so no need to change rx/ just needs to learn to use/ follow the med calendar she was provided but forgot how to use or bring to the clinic for review as requested  I had an extended discussion with the patient reviewing all relevant studies completed to date and  lasting 15 to 20 minutes of a 25 minute visit    Each maintenance medication was reviewed in detail including most importantly the difference between maintenance and prns and under what circumstances the prns are to be triggered using an action plan format that is not reflected in the computer generated alphabetically organized AVS but trather by a customized med calendar that reflects the AVS meds with confirmed 100% correlation.   Please see instructions for details which were reviewed in writing and the patient given a copy highlighting the part that I personally wrote and discussed at today's ov.

## 2015-11-10 NOTE — Assessment & Plan Note (Signed)
This is well controlled but she has mild dep edema > rec add hctz to her losartan

## 2015-11-15 DIAGNOSIS — C672 Malignant neoplasm of lateral wall of bladder: Secondary | ICD-10-CM | POA: Diagnosis not present

## 2015-11-16 ENCOUNTER — Other Ambulatory Visit: Payer: Self-pay | Admitting: Urology

## 2015-11-21 ENCOUNTER — Telehealth: Payer: Self-pay

## 2015-11-21 NOTE — Progress Notes (Signed)
REVIEWED PT CHART, NOTED PT EF 35-40%.  PER ANESTHESIA GUIDELINE PT WILL NEED TO BE DONE AT MAIN OR.  CALLED AND LM WITH CHASITY , OR SCHEDULER FOR DR [facility].

## 2015-11-21 NOTE — Telephone Encounter (Signed)
Returning call to Amber Snow to discuss the AWV. Does not feel this would be of benefit to her at this time. No overdue screens; Very proactive regarding her health. Educated on the AWV but declines at this time

## 2015-11-22 ENCOUNTER — Encounter (HOSPITAL_COMMUNITY): Payer: Self-pay

## 2015-11-22 NOTE — Patient Instructions (Addendum)
CHRISS JANISH  11/22/2015   Your procedure is scheduled on: 11/27/2015    Report to [facility] Main  Entrance take [place]  elevators to 3rd floor to  [place] at   0830 AM.  Call this number if you have problems the morning of surgery [phone]   Remember: ONLY 1 PERSON MAY GO WITH YOU TO SHORT STAY TO GET  READY MORNING OF [place].   Do not eat food or drink liquids :After Midnight.     Take these medicines the morning of surgery with A SIP OF WATER: Pepcid, use Symbicort inhaler, use Albuterol inhaler if needed                                You may not have any metal on your body including hair pins and              piercings  Do not wear jewelry, make-up, lotions, powders or perfumes, deodorant             Do not wear nail polish.  Do not shave  48 hours prior to surgery.                 Do not bring valuables to the hospital. [place].  Contacts, dentures or bridgework may not be worn into surgery.      Patients discharged the day of surgery will not be allowed to drive home.  Name and phone number of your driver: spouse- Herbie [place]  Special Instructions: coughing and deep breathing exercises, leg exercises              Please read over the following fact sheets you were given: _____________________________________________________________________             [facility] - Preparing for Surgery Before surgery, you can play an important role.  Because skin is not sterile, your skin needs to be as free of germs as possible.  You can reduce the number of germs on your skin by washing with CHG (chlorahexidine gluconate) soap before surgery.  CHG is an antiseptic cleaner which kills germs and bonds with the skin to continue killing germs even after washing. Please DO NOT use if you have an allergy to CHG or antibacterial soaps.  If your skin becomes reddened/irritated stop using the CHG and  inform your nurse when you arrive at Short Stay. Do not shave (including legs and underarms) for at least 48 hours prior to the first CHG shower.  You may shave your face/neck. Please follow these instructions carefully:  1.  Shower with CHG Soap the night before surgery and the  morning of Surgery.  2.  If you choose to wash your hair, wash your hair first as usual with your  normal  shampoo.  3.  After you shampoo, rinse your hair and body thoroughly to remove the  shampoo.                           4.  Use CHG as you would any other liquid soap.  You can apply chg directly  to the skin and wash  Gently with a scrungie or clean washcloth.  5.  Apply the CHG Soap to your body ONLY FROM THE NECK DOWN.   Do not use on face/ open                           Wound or open sores. Avoid contact with eyes, ears mouth and genitals (private parts).                       Wash face,  Genitals (private parts) with your normal soap.             6.  Wash thoroughly, paying special attention to the area where your surgery  will be performed.  7.  Thoroughly rinse your body with warm water from the neck down.  8.  DO NOT shower/wash with your normal soap after using and rinsing off  the CHG Soap.                9.  Pat yourself dry with a clean towel.            10.  Wear clean pajamas.            11.  Place clean sheets on your bed the night of your first shower and do not  sleep with pets. Day of Surgery : Do not apply any lotions/deodorants the morning of surgery.  Please wear clean clothes to the hospital/surgery center.  FAILURE TO FOLLOW THESE INSTRUCTIONS MAY RESULT IN THE CANCELLATION OF YOUR SURGERY PATIENT SIGNATURE_________________________________  NURSE SIGNATURE__________________________________  ________________________________________________________________________

## 2015-11-23 ENCOUNTER — Encounter (HOSPITAL_COMMUNITY)
Admission: RE | Admit: 2015-11-23 | Discharge: 2015-11-23 | Disposition: A | Discharge status: Home / Self Care | Payer: Medicare Other | Source: Ambulatory Visit | Attending: Urology | Admitting: Urology

## 2015-11-23 ENCOUNTER — Encounter (HOSPITAL_COMMUNITY): Payer: Self-pay

## 2015-11-23 ENCOUNTER — Other Ambulatory Visit: Payer: Self-pay | Admitting: Internal Medicine

## 2015-11-23 DIAGNOSIS — C679 Malignant neoplasm of bladder, unspecified: Secondary | ICD-10-CM | POA: Insufficient documentation

## 2015-11-23 DIAGNOSIS — Z01812 Encounter for preprocedural laboratory examination: Secondary | ICD-10-CM | POA: Diagnosis not present

## 2015-11-23 LAB — CBC
HCT: 40 % (ref 36.0–46.0)
Hemoglobin: 13.3 g/dL (ref 12.0–15.0)
MCH: 31.2 pg (ref 26.0–34.0)
MCHC: 33.3 g/dL (ref 30.0–36.0)
MCV: 93.9 fL (ref 78.0–100.0)
PLATELETS: 302 10*3/uL (ref 150–400)
RBC: 4.26 MIL/uL (ref 3.87–5.11)
RDW: 12.7 % (ref 11.5–15.5)
WBC: 8.8 10*3/uL (ref 4.0–10.5)

## 2015-11-23 LAB — BASIC METABOLIC PANEL
Anion gap: 7 (ref 5–15)
BUN: 23 mg/dL — AB (ref 6–20)
CALCIUM: 9.7 mg/dL (ref 8.9–10.3)
CO2: 27 mmol/L (ref 22–32)
CREATININE: 1 mg/dL (ref 0.44–1.00)
Chloride: 108 mmol/L (ref 101–111)
GFR calc Af Amer: >60 mL/min (ref >60)
GFR, EST NON AFRICAN AMERICAN: 52 mL/min — AB (ref >60)
Glucose, Bld: 100 mg/dL — ABNORMAL HIGH (ref 65–99)
Potassium: 5 mmol/L (ref 3.5–5.1)
SODIUM: 142 mmol/L (ref 135–145)

## 2015-11-26 DIAGNOSIS — M791 Myalgia: Secondary | ICD-10-CM | POA: Diagnosis not present

## 2015-11-26 DIAGNOSIS — M542 Cervicalgia: Secondary | ICD-10-CM | POA: Diagnosis not present

## 2015-11-26 NOTE — H&P (Signed)
Office Visit Report     11/15/2015   --------------------------------------------------------------------------------   Colan Snow. Amber  MRN: [id number]  PRIMARY CARE:  Amber Snow. Plotnikov, MD  DOB: [birth year], 82 year old Female  REFERRING:    SSN: [id number]  PROVIDER:  Festus Aloe, M.D.    LOCATION:  [facility] [phone]   --------------------------------------------------------------------------------   CC: I have bladder cancer that has been treated.  HPI: Amber Snow is a 82 year-old female established patient who is here for follow-up of bladder cancer treatment.  Her bladder cancer was superficial and limitied to the bladder lining. Her bladder cancer was not muscle invasive. She had treatment with the following intravesical agents: bcg. Patient denies mitomycin, interferon, and adriamycin.   She did have a TURBT. Her last bladder tumor was resected 07/31/2015. She has had the following number of bladder resections: 3.   She has not had blood in her urine recently. She is not having pain in new locations.   Her last cysto was 07/31/2015. Her last radiologic test to evaluate the kidneys was 07/31/2015.   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   Last BCG: Jan 2017 second induction  Last upper tract: Apr 2017 - RGP's normal     ALLERGIES: Boniva KIT Cipro TABS - Swelling Penicillins - rash    MEDICATIONS: Albuterol Sulfate TABS Oral  Ibuprofen 800 MG Oral Tablet Oral  Losartan Potassium 50 MG Oral Tablet Oral  ProAir HFA 108 (90 Base) MCG/ACT Inhalation Aerosol Solution Inhalation  Reclast 5 MG/100ML Intravenous Solution Intravenous  Symbicort 160-4.5 MCG/ACT Inhalation Aerosol Inhalation  TraZODone HCl - 50 MG Oral Tablet Oral  Vitamin D TABS Oral     GU PSH: Cysto Bladder Stone >2.5cm - 2014 Cysto Bladder Ureth Biopsy -  02/20/2015 Cysto Fulgurate < 0.5 cm - 08/13/2015, 07/04/2014, 2014 Cystoscopy Insert Stent - 02/20/2015 Cystoscopy TURBT <2 cm - 02/20/2015 Cystoscopy TURBT 2-5 cm - 2013 Renal ESWL - 2009      PSH Notes: Cystoscopy With Fulguration Minor Lesion (Under 10m), Cystoscopy With Fulguration Small Lesion (5-262m, Cystoscopy With Biopsy, Cystoscopy With Insertion Of Ureteral Stent Right, Cystoscopy With Fulguration Minor Lesion (Under 45m68m Cystoscopy With Fragmentation Of Bladder Calculus Over 2.5cm, Cystoscopy With Fulguration Minor Lesion (Under 45mm645mCystoscopy With Fulguration Medium Lesion (2-5cm), Lithotripsy, Hand Surgery, Tonsillectomy, Cataract Surgery   NON-GU PSH: Remove Tonsils - 2009    GU PMH: Bladder Cancer, overlapping sites, Primary malignant neoplasm of overlapping sites of bladder - 09/07/2015 Bladder Cancer, Unspec, Malignant tumor of urinary bladder - 07/06/2015 Bladder Cancer Posterior, Malignant neoplasm of posterior wall of urinary bladder - 04/02/2015 Acute pyelonephritis, Pyelonephritis, acute - 03/04/2015 Urinary Tract Inf, Unspec site, Acute UTI - 03/04/2015, Urinary tract infection, - 06/30/2014 Crossing vessel and stricture of ureter w/o hydronephrosis, Ureteral obstruction - 02/13/2015 Kidney Stone, Nephrolithiasis - 10/02/2014, Kidney stone on right side, - 2015, Kidney stone on left side, - 2015 Bladder Stone, Bladder calculus - 2014 Calculus Ureter, Calculus of distal right ureter - 2014, Ureteral Stone, - 2014, Calculus of left ureter, - 2014 Hematuria, Unspec, Hematuria - 2014 Personal Hx urinary calculi, Nephrolithiasis - 2014    NON-GU PMH: Encounter for general adult medical examination without abnormal findings, Encounter for preventive health examination - 07/09/2015 Solitary pulmonary nodule, Lung nodule - 06/12/2014    FAMILY HISTORY: Family Health  Status Number - Runs In Family Father Deceased At Age84 ___ - Runs In Family Heart Disease - Father Hypertension  - Mother Mother Deceased At Age 103 from diabetic complicati - Runs In Family nephrolithiasis - Runs In Family   SOCIAL HISTORY: Marital Status: Married Current Smoking Status: Patient does not smoke anymore.  Uses smokeless tobacco. Drinks 2 caffeinated drinks per day. Patient's occupation is/was retired.     Notes: Former smoker, Tobacco use, Alcohol Use, Caffeine Use, Marital History - Currently Married   REVIEW OF SYSTEMS:    GU Review Female:   Patient denies frequent urination, hard to postpone urination, burning /pain with urination, get up at night to urinate, leakage of urine, stream starts and stops, trouble starting your stream, have to strain to urinate, and currently pregnant.  Gastrointestinal (Upper):   Patient denies nausea, vomiting, and indigestion/ heartburn.  Gastrointestinal (Lower):   Patient denies diarrhea and constipation.  Constitutional:   Patient denies fever, night sweats, weight loss, and fatigue.  Skin:   Patient denies skin rash/ lesion and itching.  Eyes:   Patient denies blurred vision and double vision.  Ears/ Nose/ Throat:   Patient denies sore throat and sinus problems.  Hematologic/Lymphatic:   Patient denies swollen glands and easy bruising.  Cardiovascular:   Patient denies leg swelling and chest pains.  Respiratory:   Patient denies cough and shortness of breath.  Endocrine:   Patient denies excessive thirst.  Musculoskeletal:   Patient denies back pain and joint pain.  Neurological:   Patient denies headaches and dizziness.  Psychologic:   Patient denies depression and anxiety.   VITAL SIGNS:      11/15/2015 10:27 AM  Weight 105 lb / 47.63 kg  BP 148/70 mmHg  Heart Rate 75 /min   GU PHYSICAL EXAMINATION:    External Genitalia: No hirsutism, no rash, no scarring, no cyst, no erythematous lesion, no papular lesion, no blanched lesion, no warty lesion. No edema.  Urethral Meatus: Normal size. Normal position. No discharge.  Urethra: No  tenderness, no mass, no scarring. No hypermobility. No leakage.  Bladder: Normal to palpation, no tenderness, no mass, normal size.  Vagina: Moderate vaginal atrophy. No stenosis. No rectocele. No cystocele. No enterocele.    MULTI-SYSTEM PHYSICAL EXAMINATION:    Constitutional: Well-nourished. No physical deformities. Normally developed. Good grooming.  Neck: Neck symmetrical, not swollen. Normal tracheal position.  Respiratory: No labored breathing, no use of accessory muscles.   Cardiovascular: Normal temperature, normal extremity pulses, no swelling, no varicosities.  Neurologic / Psychiatric: Oriented to time, oriented to place, oriented to person. No depression, no anxiety, no agitation.  Gastrointestinal: No mass, no tenderness, no rigidity, non obese abdomen.     PAST DATA REVIEWED:  Source Of History:  Patient   PROCEDURES:         Flexible Cystoscopy - 52000  Risks, benefits, and some of the potential complications of the procedure were discussed at length with the patient including infection, bleeding, voiding discomfort, urinary retention, fever, chills, sepsis, and others. All questions were answered. Informed consent was obtained. Antibiotic prophylaxis was given. Sterile technique and intraurethral analgesia were used. Chaperone: Erica  Meatus:  Normal size. Normal location. Normal condition.  Urethra:  No hypermobility. No leakage.  Ureteral Orifices:  Normal location. Normal size. Normal shape. Effluxed clear urine.  Bladder:  Erythematous mucosa and papillary formations left, dome and right - multifocal. No trabeculation. No tumors. No stones.      The lower urinary tract  was carefully examined. The procedure was well-tolerated and without complications. Antibiotic instructions were given. Instructions were given to call the office immediately for bloody urine, difficulty urinating, urinary retention, painful or frequent urination, fever, chills, nausea, vomiting or other  illness. The patient stated that she understood these instructions and would comply with them.         Urinalysis w/Scope - 81001 Dipstick Dipstick Cont'd Micro  Specimen: Voided Bilirubin: Neg WBC/hpf: 0-5/hpf  Color: Yellow Ketones: Neg RBC/hpf: 0-2/hpf  Appearance: Clear Blood: Neg Bacteria: Rare  Specific Gravity: 1.010 Protein: Neg Cystals: NS (Not Seen)  pH: 6.0 Urobilinogen: 0.2 Casts: NS (Not Seen)  Glucose: Neg Nitrites: Neg Trichomonas: Not Present    Leukocyte Esterase: Trace Mucous: Not Present      Epithelial Cells: 0-5/hpf      Yeast: NS (Not Seen)      Sperm: Not Present         Notes:   covered with Cipro.   ASSESSMENT:      ICD-10 Details  1 GU:   Bladder Cancer Lateral - C67.2    PLAN:           Schedule Return Visit: Next Available Appointment - Schedule Surgery          Document Letter(s):  Created for Patient: Clinical Summary         Notes:   recurrent bladder cancer-multifocal tumors and bladder. Discussed findings and nature r/b of cystoscopy bladder biopsy, fulguration, TURBT with postop mitomycin and then have her follow-up for mitomycin instillations at [facility]. If this fails again we'll need to consider cystectomy.   cc: Dr. Alain Marion     * Signed by Festus Aloe, M.D. on 11/15/15 at 4:43 PM (EDT)*     The information contained in this medical record document is considered private and confidential patient information. This information can only be used for the medical diagnosis and/or medical services that are being provided by the patient's selected caregivers. This information can only be distributed outside of the patient's care if the patient agrees and signs waivers of authorization for this information to be sent to an outside source or route.

## 2015-11-27 ENCOUNTER — Encounter (HOSPITAL_COMMUNITY): Payer: Self-pay | Admitting: *Deleted

## 2015-11-27 ENCOUNTER — Ambulatory Visit (HOSPITAL_COMMUNITY): Payer: Medicare Other | Admitting: Anesthesiology

## 2015-11-27 ENCOUNTER — Encounter (HOSPITAL_COMMUNITY)
Admission: RE | Disposition: A | Discharge status: Home / Self Care | Payer: Self-pay | Source: Ambulatory Visit | Attending: Urology

## 2015-11-27 ENCOUNTER — Ambulatory Visit (HOSPITAL_COMMUNITY)
Admission: RE | Admit: 2015-11-27 | Discharge: 2015-11-27 | Disposition: A | Discharge status: Home / Self Care | Payer: Medicare Other | Source: Ambulatory Visit | Attending: Urology | Admitting: Urology

## 2015-11-27 DIAGNOSIS — J449 Chronic obstructive pulmonary disease, unspecified: Secondary | ICD-10-CM | POA: Diagnosis not present

## 2015-11-27 DIAGNOSIS — Z87891 Personal history of nicotine dependence: Secondary | ICD-10-CM | POA: Diagnosis not present

## 2015-11-27 DIAGNOSIS — C678 Malignant neoplasm of overlapping sites of bladder: Secondary | ICD-10-CM

## 2015-11-27 DIAGNOSIS — Z7951 Long term (current) use of inhaled steroids: Secondary | ICD-10-CM | POA: Insufficient documentation

## 2015-11-27 DIAGNOSIS — Z08 Encounter for follow-up examination after completed treatment for malignant neoplasm: Secondary | ICD-10-CM | POA: Diagnosis present

## 2015-11-27 DIAGNOSIS — C679 Malignant neoplasm of bladder, unspecified: Secondary | ICD-10-CM | POA: Diagnosis not present

## 2015-11-27 DIAGNOSIS — I2510 Atherosclerotic heart disease of native coronary artery without angina pectoris: Secondary | ICD-10-CM | POA: Insufficient documentation

## 2015-11-27 DIAGNOSIS — Z791 Long term (current) use of non-steroidal anti-inflammatories (NSAID): Secondary | ICD-10-CM | POA: Diagnosis not present

## 2015-11-27 DIAGNOSIS — C672 Malignant neoplasm of lateral wall of bladder: Secondary | ICD-10-CM | POA: Diagnosis not present

## 2015-11-27 DIAGNOSIS — Z79899 Other long term (current) drug therapy: Secondary | ICD-10-CM | POA: Insufficient documentation

## 2015-11-27 DIAGNOSIS — C674 Malignant neoplasm of posterior wall of bladder: Secondary | ICD-10-CM | POA: Diagnosis not present

## 2015-11-27 HISTORY — PX: CYSTOSCOPY WITH FULGERATION: SHX6638

## 2015-11-27 HISTORY — PX: TRANSURETHRAL RESECTION OF BLADDER TUMOR: SHX2575

## 2015-11-27 SURGERY — CYSTOSCOPY, WITH BLADDER FULGURATION
Anesthesia: General

## 2015-11-27 MED ORDER — DEXAMETHASONE SODIUM PHOSPHATE 10 MG/ML IJ SOLN
INTRAMUSCULAR | Status: AC
  Filled 2015-11-27: qty 1

## 2015-11-27 MED ORDER — LACTATED RINGERS IV SOLN
INTRAVENOUS | Status: DC
  Administered 2015-11-27: 10:00:00 via INTRAVENOUS

## 2015-11-27 MED ORDER — HYDROMORPHONE HCL 1 MG/ML IJ SOLN: 0.2500 mg | INTRAMUSCULAR | Status: DC | PRN

## 2015-11-27 MED ORDER — FENTANYL CITRATE (PF) 100 MCG/2ML IJ SOLN
INTRAMUSCULAR | Status: AC
  Filled 2015-11-27: qty 2

## 2015-11-27 MED ORDER — FENTANYL CITRATE (PF) 100 MCG/2ML IJ SOLN
INTRAMUSCULAR | Status: DC | PRN
  Administered 2015-11-27: 50 ug via INTRAVENOUS

## 2015-11-27 MED ORDER — DEXAMETHASONE SODIUM PHOSPHATE 10 MG/ML IJ SOLN
INTRAMUSCULAR | Status: DC | PRN
  Administered 2015-11-27: 10 mg via INTRAVENOUS

## 2015-11-27 MED ORDER — BELLADONNA ALKALOIDS-OPIUM 16.2-60 MG RE SUPP
RECTAL | Status: AC
  Filled 2015-11-27: qty 1

## 2015-11-27 MED ORDER — PROPOFOL 10 MG/ML IV BOLUS
INTRAVENOUS | Status: DC | PRN
  Administered 2015-11-27: 50 mg via INTRAVENOUS
  Administered 2015-11-27: 100 mg via INTRAVENOUS
  Administered 2015-11-27: 50 mg via INTRAVENOUS

## 2015-11-27 MED ORDER — LIDOCAINE HCL (CARDIAC) 20 MG/ML IV SOLN
INTRAVENOUS | Status: DC | PRN
  Administered 2015-11-27: 100 mg via INTRATRACHEAL

## 2015-11-27 MED ORDER — CEFAZOLIN SODIUM-DEXTROSE 2-4 GM/100ML-% IV SOLN
2.0000 g | INTRAVENOUS | Status: AC
  Administered 2015-11-27: 2 g via INTRAVENOUS

## 2015-11-27 MED ORDER — ONDANSETRON HCL 4 MG/2ML IJ SOLN
INTRAMUSCULAR | Status: DC | PRN
  Administered 2015-11-27: 4 mg via INTRAVENOUS

## 2015-11-27 MED ORDER — MEPERIDINE HCL 50 MG/ML IJ SOLN: 6.2500 mg | INTRAMUSCULAR | Status: DC | PRN

## 2015-11-27 MED ORDER — CEFAZOLIN SODIUM-DEXTROSE 2-4 GM/100ML-% IV SOLN
INTRAVENOUS | Status: AC
  Filled 2015-11-27: qty 100

## 2015-11-27 MED ORDER — LIDOCAINE HCL (CARDIAC) 20 MG/ML IV SOLN
INTRAVENOUS | Status: AC
  Filled 2015-11-27: qty 5

## 2015-11-27 MED ORDER — PROPOFOL 10 MG/ML IV BOLUS
INTRAVENOUS | Status: AC
  Filled 2015-11-27: qty 20

## 2015-11-27 MED ORDER — EPHEDRINE SULFATE 50 MG/ML IJ SOLN
INTRAMUSCULAR | Status: DC | PRN
  Administered 2015-11-27 (×2): 10 mg via INTRAVENOUS

## 2015-11-27 MED ORDER — ONDANSETRON HCL 4 MG/2ML IJ SOLN: 4.0000 mg | Freq: Once | INTRAMUSCULAR | Status: DC | PRN

## 2015-11-27 MED ORDER — SODIUM CHLORIDE 0.9 % IR SOLN
Status: DC | PRN
  Administered 2015-11-27: 7000 mL via INTRAVESICAL

## 2015-11-27 MED ORDER — EPHEDRINE SULFATE 50 MG/ML IJ SOLN
INTRAMUSCULAR | Status: AC
  Filled 2015-11-27: qty 1

## 2015-11-27 MED ORDER — ONDANSETRON HCL 4 MG/2ML IJ SOLN
INTRAMUSCULAR | Status: AC
  Filled 2015-11-27: qty 2

## 2015-11-27 MED ORDER — SODIUM CHLORIDE 0.9 % IJ SOLN
INTRAMUSCULAR | Status: AC
  Filled 2015-11-27: qty 10

## 2015-11-27 SURGICAL SUPPLY — 13 items
BAG URINE DRAINAGE (UROLOGICAL SUPPLIES) IMPLANT
BAG URO CATCHER STRL LF (MISCELLANEOUS) ×3 IMPLANT
CATH FOLEY 2WAY SLVR  5CC 16FR (CATHETERS) ×2
CATH FOLEY 2WAY SLVR 5CC 16FR (CATHETERS) IMPLANT
GLOVE BIOGEL M STRL SZ7.5 (GLOVE) ×3 IMPLANT
GOWN STRL REUS W/TWL XL LVL3 (GOWN DISPOSABLE) ×3 IMPLANT
LOOP CUT BIPOLAR 24F LRG (ELECTROSURGICAL) ×2 IMPLANT
MANIFOLD NEPTUNE II (INSTRUMENTS) ×3 IMPLANT
NS IRRIG 1000ML POUR BTL (IV SOLUTION) ×2 IMPLANT
PACK CYSTO (CUSTOM PROCEDURE TRAY) ×3 IMPLANT
TUBING CONNECTING 10 (TUBING) ×2 IMPLANT
TUBING CONNECTING 10' (TUBING) ×1
WATER STERILE IRR 500ML POUR (IV SOLUTION) ×2 IMPLANT

## 2015-11-27 NOTE — Anesthesia Procedure Notes (Signed)
Procedure Name: LMA Insertion Date/Time: 11/27/2015 10:49 AM Performed by: Dione Booze Pre-anesthesia Checklist: Patient identified, Emergency Drugs available, Suction available and Patient being monitored Patient Re-evaluated:Patient Re-evaluated prior to inductionOxygen Delivery Method: Circle system utilized Preoxygenation: Pre-oxygenation with 100% oxygen Intubation Type: IV induction LMA: LMA inserted LMA Size: 4.0 Number of attempts: 2 Placement Confirmation: positive ETCO2 and breath sounds checked- equal and bilateral Tube secured with: Tape Dental Injury: Teeth and Oropharynx as per pre-operative assessment

## 2015-11-27 NOTE — Anesthesia Preprocedure Evaluation (Signed)
Anesthesia Evaluation  Patient identified by MRN, date of birth, ID band Patient awake    Reviewed: Allergy & Precautions, NPO status , Patient's Chart, lab work & pertinent test results  Airway Mallampati: I  TM Distance: >3 FB Neck ROM: Full    Dental   Pulmonary COPD, former smoker,    Pulmonary exam normal        Cardiovascular + CAD  Normal cardiovascular exam     Neuro/Psych    GI/Hepatic   Endo/Other    Renal/GU      Musculoskeletal   Abdominal   Peds  Hematology   Anesthesia Other Findings   Reproductive/Obstetrics                             Anesthesia Physical Anesthesia Plan  ASA: III  Anesthesia Plan: General   Post-op Pain Management:    Induction: Intravenous  Airway Management Planned: LMA  Additional Equipment:   Intra-op Plan:   Post-operative Plan: Extubation in OR  Informed Consent: I have reviewed the patients History and Physical, chart, labs and discussed the procedure including the risks, benefits and alternatives for the proposed anesthesia with the patient or authorized representative who has indicated his/her understanding and acceptance.     Plan Discussed with: CRNA and Surgeon  Anesthesia Plan Comments:         Anesthesia Quick Evaluation

## 2015-11-27 NOTE — Discharge Instructions (Signed)
General Anesthesia, Adult, Care After Refer to this sheet in the next few weeks. These instructions provide you with information on caring for yourself after your procedure. Your health care provider may also give you more specific instructions. Your treatment has been planned according to current medical practices, but problems sometimes occur. Call your health care provider if you have any problems or questions after your procedure. WHAT TO EXPECT AFTER THE PROCEDURE After the procedure, it is typical to experience:  Sleepiness.  Nausea and vomiting. HOME CARE INSTRUCTIONS  For the first 24 hours after general anesthesia:  Have a responsible person with you.  Do not drive a car. If you are alone, do not take public transportation.  Do not drink alcohol.  Do not take medicine that has not been prescribed by your health care provider.  Do not sign important papers or make important decisions.  You may resume a normal diet and activities as directed by your health care provider.  If you have questions or problems that seem related to general anesthesia, call the hospital and ask for the anesthetist or anesthesiologist on call. SEEK MEDICAL CARE IF:  You have nausea and vomiting that continue the day after anesthesia.  You develop a rash. SEEK IMMEDIATE MEDICAL CARE IF:   You have difficulty breathing.  You have chest pain.  You have any allergic problems.   This information is not intended to replace advice given to you by your health care provider. Make sure you discuss any questions you have with your health care provider.   Document Released: 07/21/2000 Document Revised: 05/05/2014 Document Reviewed: 08/13/2011 Elsevier Interactive Patient Education [employer].

## 2015-11-27 NOTE — Transfer of Care (Signed)
Immediate Anesthesia Transfer of Care Note  Patient: Amber Snow  Procedure(s) Performed: Procedure(s) with comments: CYSTOSCOPY WITH FULGERATION BLADDER BIOPSY  (N/A) - 1 HOUR TRANSURETHRAL RESECTION OF BLADDER TUMOR (TURBT) (N/A)  Patient Location: PACU  Anesthesia Type:General  Level of Consciousness: awake and patient cooperative  Airway & Oxygen Therapy: Patient Spontanous Breathing and Patient connected to face mask oxygen  Post-op Assessment: Report given to RN and Post -op Vital signs reviewed and stable  Post vital signs: Reviewed and stable  Last Vitals:  Vitals:   11/27/15 0826  BP: (!) 153/72  Pulse: 99  Resp: 18  Temp: 36.6 C    Last Pain:  Vitals:   11/27/15 0826  TempSrc: Oral      Patients Stated Pain Goal: 4 ([id number])  Complications: No apparent anesthesia complications

## 2015-11-27 NOTE — Interval H&P Note (Signed)
History and Physical Interval Note:  11/27/2015 10:29 AM  Amber Snow  has presented today for surgery, with the diagnosis of BLADDER CANCER  The various methods of treatment have been discussed with the patient and family. After consideration of risks, benefits and other options for treatment, the patient has consented to  Procedure(s) with comments: [place]  (N/A) - 1 HOUR TRANSURETHRAL RESECTION OF BLADDER TUMOR (TURBT) (N/A) as a surgical intervention .  The patient's history has been reviewed, patient examined, no change in status, stable for surgery.  I have reviewed the patient's chart and labs. I went through the penicillin allergy questions with the patient. She recalls a rash as a child. No immediate rash, severe rash, facial swelling, skin necrosis, SOB, etc. I discussed with the patient concern for PCN and cephalosporin cross reactivity and she elected to proceed with cephalexin. Questions were answered to the patient's satisfaction.  She has been well. No dysuria or gross hematuria.    [name]

## 2015-11-27 NOTE — Anesthesia Postprocedure Evaluation (Signed)
Anesthesia Post Note  Patient: Amber Snow  Procedure(s) Performed: Procedure(s) (LRB): CYSTOSCOPY WITH FULGERATION BLADDER BIOPSY  (N/A) TRANSURETHRAL RESECTION OF BLADDER TUMOR (TURBT) (N/A)  Patient location during evaluation: PACU Anesthesia Type: General Level of consciousness: awake and alert Pain management: pain level controlled Vital Signs Assessment: post-procedure vital signs reviewed and stable Respiratory status: spontaneous breathing, nonlabored ventilation, respiratory function stable and patient connected to nasal cannula oxygen Cardiovascular status: blood pressure returned to baseline and stable Postop Assessment: no signs of nausea or vomiting Anesthetic complications: no    Last Vitals:  Vitals:   11/27/15 1230 11/27/15 1350  BP: (!) 136/59 (!) 145/69  Pulse: 79 71  Resp: 14 18  Temp: 36.6 C 37.3 C    Last Pain:  Vitals:   11/27/15 1350  TempSrc: Axillary  PainSc:                  Amber Snow

## 2015-11-27 NOTE — Op Note (Signed)
Preoperative diagnosis: Urothelial carcinoma Postoperative diagnosis: Same  Procedure: Cystoscopy with bladder biopsy and fulguration, 0.5-2 cm  Surgeon: Junious Silk  Anesthesia: Gen.  Indication for procedure: 82 year old with recurrent high-grade TA bladder tumors. She also had prior CIS. We reinitiated BCG but she quickly developed multiple papillary tumors. She is brought today for biopsy and fulguration. She also develops dystrophic calcifications.  Findings: On exam under anesthesia the abdomen and bladder were palpably normal. The bladder and urethra were palpably normal on bimanual exam.  On cystoscopy there were multiple papillary tumors mainly centered toward the dome and anterior as well as left posterior. There was a patch at the left trigone. In some scattered on the right. There were superficial and sheared or scraped right off the mucosa. The prior areas of dystrophic calcification healed well with some calcium remaining on the right posterior and left lateral sites and I was able to remove much of that today.  Description of procedure: After consent was obtained patient brought to the operating room. After adequate anesthesia she was placed in lithotomy position and prepped and draped in the usual sterile fashion. A timeout was performed to confirm the patient and procedure. An exam under anesthesia was performed. The cystoscope was passed per urethra and the bladder neck inspected in the entirety. The left posterior lesions were representative of one found throughout the bladder and easy to biopsy and these were biopsied and fulgurated. This was about 2 cm. More anteriorly there is a 2 cm area that was fulgurated to destroy tumor. There were other areas on the right and left trigone where the abnormal cells were brushed off and the underneath lightly fulgurated. The loop was used to remove some of the calcium in the right posterior a small area right bladder neck and the left lateral  site. Hemostasis was insured at low-pressure. The bladder was filled and the scope removed. A 16 French Foley was placed and left gravity drainage for wake up.  Complications: None  Blood loss: Minimal  Specimens to pathology: Left posterior bladder biopsy  Drains: 16 French Foley

## 2015-11-28 NOTE — Telephone Encounter (Signed)
Called refill into [facility] had to leave on pharmacy vm../lmb 

## 2015-12-04 MED ORDER — LOSARTAN POTASSIUM-HCTZ 50-12.5 MG PO TABS: 1.0000 | ORAL_TABLET | Freq: Every day | ORAL | 3 refills | Status: DC

## 2015-12-04 NOTE — Telephone Encounter (Signed)
Change Cozaar to Hyzaar due to swelling

## 2015-12-05 NOTE — Telephone Encounter (Signed)
Pt informed

## 2015-12-14 DIAGNOSIS — C672 Malignant neoplasm of lateral wall of bladder: Secondary | ICD-10-CM | POA: Diagnosis not present

## 2015-12-15 ENCOUNTER — Encounter (INDEPENDENT_AMBULATORY_CARE_PROVIDER_SITE_OTHER): Payer: Self-pay

## 2015-12-15 DIAGNOSIS — M4712 Other spondylosis with myelopathy, cervical region: Secondary | ICD-10-CM

## 2015-12-15 DIAGNOSIS — M5010 Cervical disc disorder with radiculopathy, unspecified cervical region: Secondary | ICD-10-CM | POA: Insufficient documentation

## 2015-12-18 ENCOUNTER — Telehealth: Payer: Self-pay | Admitting: *Deleted

## 2015-12-18 NOTE — Telephone Encounter (Signed)
Patient advised of appt with Dr. Alen Blew.

## 2015-12-19 DIAGNOSIS — N200 Calculus of kidney: Secondary | ICD-10-CM | POA: Diagnosis not present

## 2015-12-19 DIAGNOSIS — C672 Malignant neoplasm of lateral wall of bladder: Secondary | ICD-10-CM | POA: Diagnosis not present

## 2015-12-19 NOTE — Telephone Encounter (Signed)
Called after "missed call from Clarise Cruz in regards to appointment with Dr. Alen Blew and call for more information.  I will be home the rest of the afternoon."

## 2015-12-20 ENCOUNTER — Encounter: Payer: Self-pay | Admitting: Oncology

## 2015-12-20 ENCOUNTER — Telehealth: Payer: Self-pay | Admitting: Oncology

## 2015-12-20 NOTE — Telephone Encounter (Signed)
Appt reschedule w/Shadad for 9/8 at 2pm. Letter mailed to the pt. Demographics verified.

## 2015-12-25 ENCOUNTER — Ambulatory Visit: Payer: Medicare Other | Admitting: Oncology

## 2016-01-04 ENCOUNTER — Ambulatory Visit (HOSPITAL_BASED_OUTPATIENT_CLINIC_OR_DEPARTMENT_OTHER): Payer: Medicare Other | Admitting: Oncology

## 2016-01-04 ENCOUNTER — Telehealth: Payer: Self-pay | Admitting: Oncology

## 2016-01-04 VITALS — BP 132/68 | HR 83 | Temp 98.3°F | Resp 18 | Wt 96.0 lb

## 2016-01-04 DIAGNOSIS — C679 Malignant neoplasm of bladder, unspecified: Secondary | ICD-10-CM | POA: Diagnosis not present

## 2016-01-04 NOTE — Telephone Encounter (Signed)
GAVE PATIENT AVS REPORT APPOINTENTMENTS FOR September AND October

## 2016-01-04 NOTE — Progress Notes (Signed)
Reason for Referral: Bladder tumor.   HPI: 82 year old woman currently of [place] where she lived for the last 20 years. She has a history of coronary artery disease, COPD and a previous smoking history. She presented with hematuria back in 2013 and was evaluated for possible nephrolithiasis. Her hematuria persisted and subsequently underwent cystoscopy by Dr. Janice Norrie and found to have a high grade 2 fatigue a multifocal disease. She underwent multiple TURBTs but had multiple BCG treatment since that time. Her most recent cystoscopy done in August 2017 under the care of Dr. Junious Silk which showed high-grade urothelial carcinoma without invasion. Given the fact that she has failed BCG treatment, she was referred to me for evaluation for possible mitomycin treatment. Clinically, she reports no symptoms at this time. She does report some fatigue and tiredness associated with her previous surgery but this have resolved. She denied any hematuria, dysuria or major changes in her performance status. She remains active and attends to activities of daily living.  She does not report any headaches, blurry vision, syncope or seizures. She does not report any fevers or chills or sweats. She did report close to 20 pound weight loss last few years. She does not report any but does report occasional cough and wheezing. She does not report any chest pain, palpitation, orthopnea or leg edema. She does not report any nausea, vomiting or abdominal pain. She does not report any frequency urgency or hesitancy. She does not report any skeletal complaints. Remaining review of systems unremarkable.  Past Medical History:  Diagnosis Date  . Acute exacerbation of chronic obstructive pulmonary disease (COPD) (HCC)    ON PREDNISONE INCREASED DOSE  FOR LAST WEEK  . Arthritis HANDS  . Bladder calculus   . Bladder cancer ([place])   . CAD (coronary artery disease) CARDIOLOGSIT-- DR Martinique   05-17-2013minimal nonobstructive CAD per cath  with EF of 35 to 40% and normal filling pressures and right heart pressures-Dr. Martinique follows  . Chronic systolic CHF (congestive heart failure) ([place])   . COPD, moderate ([place]) PULMOLOGIST-- DR Melvyn Novas   W/ EMPHYSEMA  (COPD  GOLD II)- Dr. Melvyn Novas follows  . Diverticulosis of colon   . Dyspnea on exertion   . History of nephrolithiasis   . Hyperlipidemia   . LBBB (left bundle branch block)    CHRONIC  . Myalgia   . Neck pain    07-30-15 at present and flares periodically"? stenosis"  . Nocturia   . Nodule of right lung    APICAL 1.0CM (MONITORED BY DR Melvyn Novas)  . Nonischemic cardiomyopathy ([place])   . Osteopenia   . PAC (premature atrial contraction)   . Pneumonia 1-2 YEARS AGO  . Renal calculus, bilateral   . Wears hearing aid    BILATERAL  :  Past Surgical History:  Procedure Laterality Date  . CARDIAC CATHETERIZATION  09-12-2011  DR Martinique   MINIMAL NONOBSTRUCTIVE CAD/ MODERATE TO SEVERE LV DYSFUNCTION/  NORMAL RIGHT HEART PRESSURES AND LV FILLING PRESSURES  . CATARACT EXTRACTION W/ INTRAOCULAR LENS  IMPLANT, BILATERAL  1998  . COSMETIC SURGERY     eyelids  . CYSTO/ LEFT RETROGRADE PYELOGRAM/ LEFT URETERAL STENT PLACEMENT  10-16-2001   STONE  . CYSTOSCOPY  07/01/2011   Procedure: CYSTOSCOPY;  Surgeon: Hanley Ben, MD;  Location: [facility];  Service: Urology;  Laterality: N/A;  retrieval l of bladder stone  . CYSTOSCOPY N/A 08/20/2012   Procedure: CYSTOSCOPY;  Surgeon: Hanley Ben, MD;  Location[facility];  Service: Urology;  Laterality: N/A;  fulgeration of bladderm tumors  . CYSTOSCOPY N/A 06/23/2014   Procedure: CYSTOSCOPY;  Surgeon: Arvil Persons, MD;  Location: [facility];  Service: Urology;  Laterality: N/A;  . CYSTOSCOPY W/ RETROGRADES Right 04/01/2013   Procedure: CYSTOSCOPY WITH RETROGRADE PYELOGRAM;  Surgeon: Hanley Ben, MD;  Location: [facility];  Service: Urology;  Laterality: Right;  . CYSTOSCOPY WITH  BIOPSY N/A 02/06/2015   Procedure: CYSTO WITH BLADDER BIOPSY;  Surgeon: Festus Aloe, MD;  Location: [facility];  Service: Urology;  Laterality: N/A;  . CYSTOSCOPY WITH FULGERATION N/A 11/27/2015   Procedure: CYSTOSCOPY WITH FULGERATION BLADDER BIOPSY ;  Surgeon: Festus Aloe, MD;  Location: [facility];  Service: Urology;  Laterality: N/A;  . CYSTOSCOPY WITH LITHOLAPAXY N/A 04/01/2013   Procedure: CYSTOSCOPY WITH HOLMIUM LASER OF  BLADDER CALCULUS;  Surgeon: Hanley Ben, MD;  Location: [facility];  Service: Urology;  Laterality: N/A;  . CYSTOSCOPY WITH RETROGRADE PYELOGRAM, URETEROSCOPY AND STENT PLACEMENT Bilateral 07/31/2015   Procedure: CYSTOSCOPY WITH FULGERATION BLADDER BIOPSY, CYSTOSCOPY WITH BILATERAL RETROGRADE PYELOGRAMS;  Surgeon: Festus Aloe, MD;  Location: [facility];  Service: Urology;  Laterality: Bilateral;  . EXTRACORPOREAL SHOCK WAVE LITHOTRIPSY  2003  . FULGURATION OF BLADDER TUMOR  06/23/2014   Procedure: FULGURATION OF BLADDER TUMOR;  Surgeon: Arvil Persons, MD;  Location: [facility];  Service: Urology;;  . FULGURATION OF BLADDER TUMOR N/A 02/06/2015   Procedure: FULGURATION OF BLADDER TUMORS;  Surgeon: Festus Aloe, MD;  Location: [facility];  Service: Urology;  Laterality: N/A;  . HOLMIUM LASER APPLICATION N/A [id number]   Procedure: HOLMIUM LASER APPLICATION;  Surgeon: Hanley Ben, MD;  Location: [facility];  Service: Urology;  Laterality: N/A;  . LEFT AND RIGHT HEART CATHETERIZATION WITH CORONARY ANGIOGRAM Bilateral 09/12/2011   Procedure: LEFT AND RIGHT HEART CATHETERIZATION WITH CORONARY ANGIOGRAM;  Surgeon: Peter M Martinique, MD;  Location: [facility];  Service: Cardiovascular;  Laterality: Bilateral;  . LEFT LONG FINGER ARTHROPLASTY & PULLEY RELASE  02-17-2005  . ORIF FINGER / THUMB FRACTURE    . PULMONARY FUNCTION ANALYSIS  07-31-2009   MODERATE TO SEVERE OBSTRUCTIVE AIRWAY DISEASE/ INSIGNIFICANT RESPONSE TO  BRONCHODILATORS/ EMPHYSEMA PATTERN/ MILD DECREASE IN DIFFUSE CAPACITY FEF 25-75 CHANGED BY 8%  . TONSILLECTOMY  CHILD  . TOOTH EXTRACTION  01-17-15   LOWER TOOTH CROWN  BROKE OFF AND WAS EXTRACTED  . TRANSTHORACIC ECHOCARDIOGRAM  last one 03-15-2014  DR Martinique   GRADE I DIASTOLIC DYSFUNCTION/  confirmed by dr Liane Comber  EF 45% (down from 60-65% 03-12-2013 but up from 25-30% compared to last echo 10-01-2012 /  NORMAL LV SIZE AND WALL THICKNESS/  trivial TR  . TRANSURETHRAL RESECTION OF BLADDER TUMOR  07/01/2011   Procedure: TRANSURETHRAL RESECTION OF BLADDER TUMOR (TURBT);  Surgeon: Hanley Ben, MD;  Location: [facility];  Service: Urology;  Laterality: N/A;  CYSTO  . TRANSURETHRAL RESECTION OF BLADDER TUMOR N/A 02/06/2015   Procedure:  TRANSURETHRAL RESECTION OF BLADDER TUMOR (TURBT) GREATER THAN 5 CM; RIGHT RETROGRADE, RIGHT STENT PLACEMENT;  Surgeon: Festus Aloe, MD;  [facility];  Service: Urology;  Laterality: N/A;  . TRANSURETHRAL RESECTION OF BLADDER TUMOR N/A 07/31/2015   Procedure:  TRANSURETHRAL RESECTION OF BLADDER TUMOR (TURBT); REMOVAL OF DYSTROPHIC CALCIFICATIONS;  Surgeon: Festus Aloe, MD;  Location[facility];  Service: Urology;  Laterality: N/A;  . TRANSURETHRAL RESECTION OF BLADDER TUMOR N/A 11/27/2015   Procedure: TRANSURETHRAL RESECTION OF BLADDER TUMOR (  TURBT);  Surgeon: Festus Aloe, MD;  Location[facility];  Service: Urology;  Laterality: N/A;  . TUBAL LIGATION  AGE 40'S  :   Current Outpatient Prescriptions:  .  albuterol (PROAIR HFA) 108 (90 BASE) MCG/ACT inhaler, Inhale 1-2 puffs into the lungs every 4 (four) hours as needed for wheezing or shortness of breath., Disp: 1 Inhaler, Rfl: 10 .  ALPRAZolam (XANAX) 0.25 MG tablet, TAKE 1/2 TO 1 TABLET BY MOUTH TWICE A DAY AS NEEDED FOR ANXIETY (SHORTNESS OF BREATH), Disp: 60 tablet, Rfl: 2 .  baclofen (LIORESAL) 10 MG tablet, Take 10 mg by mouth 3 (three) times daily as needed for muscle spasms. ,  Disp: , Rfl:  .  BIOTIN PO, Take 1 capsule by mouth daily., Disp: , Rfl:  .  Cholecalciferol (VITAMIN D) 1000 UNITS capsule, Take 1,000 Units by mouth daily. , Disp: , Rfl:  .  famotidine (PEPCID) 20 MG tablet, Take 20 mg by mouth 2 (two) times daily., Disp: , Rfl:  .  furosemide (LASIX) 20 MG tablet, Take 1 tablet (20 mg total) by mouth daily as needed for edema., Disp: 30 tablet, Rfl: 5 .  ibuprofen (ADVIL,MOTRIN) 200 MG tablet, Take 600-800 mg by mouth every 6 (six) hours as needed for moderate pain., Disp: , Rfl:  .  lidocaine (LIDODERM) 5 %, Place 1 patch onto the skin daily as needed (NECK PAIN). Remove & Discard patch within 12 hours or as directed by MD, Disp: , Rfl:  .  losartan-hydrochlorothiazide (HYZAAR) 50-12.5 MG tablet, Take 1 tablet by mouth daily., Disp: 90 tablet, Rfl: 3 .  SYMBICORT 160-4.5 MCG/ACT inhaler, INHALE 2 PUFFS INTO THE LUNGS TWICE A DAY, Disp: 10.2 Inhaler, Rfl: 9 .  traZODone (DESYREL) 50 MG tablet, TAKE 1 TABLET BY MOUTH EVERY DAY (Patient taking differently: TAKE 1 TABLET BY MOUTH EVERY DAY AT BEDTIME), Disp: 90 tablet, Rfl: 2 .  zoledronic acid (RECLAST) 5 MG/100ML SOLN, Inject 5 mg into the vein once. Every year, Disp: , Rfl: :  Allergies  Allergen Reactions  . Barbiturates Rash  . Penicillins Rash    TOLERATES CEPHALOSPORINS Has patient had a PCN reaction causing immediate rash, facial/tongue/throat swelling, SOB or lightheadedness with hypotension: Yes  Has patient had a PCN reaction causing severe rash involving mucus membranes or skin necrosis: Yes. Itchy Has patient had a PCN reaction that required hospitalization No Has patient had a PCN reaction occurring within the last 10 years: No If all of the above answers are "NO", then may proceed with Cephalosporin use.   . Shrimp [Shellfish Allergy] Swelling  . Anoro Ellipta [Umeclidinium-Vilanterol]     cough  . Aspirin Nausea Only  . Beta Adrenergic Blockers     PT STATES TOLD TO AVOID DUE TO COUGH  REACTION TO CARDIZEM  . Bisoprolol     SOB and dizzy  . Calcium Channel Blockers     PER PT TOLD TO AVOID DUE TO COUGH REACTION TO CARDIZEM  . Celecoxib Nausea Only  . Ciprofloxacin Other (See Comments)    Red streaks on arm when given IV  . Codeine Other (See Comments)    REACTION: excessive sleep  . Diltiazem Hcl Other (See Comments)    REACTION: cough  . Fluvastatin Sodium     Hair fell out, finger nails fell off   . Ibandronate Sodium Other (See Comments)    REACTION: HAIR LOSS/NAIL CHANGES  . Oxycodone-Acetaminophen Nausea Only  . Rosuvastatin     Hair fell out, finger  nails fell off   . Simvastatin     Hair fell out, finger nails fell off   . Tramadol Nausea And Vomiting  :  Family History  Problem Relation Age of Onset  . Heart disease Mother   . Hypertension Mother   . Hypertension Father   . Heart attack Father   . Heart disease Father   . Thyroid disease Sister   . Heart attack Sister   . Stroke Sister   . COPD Sister   . Heart attack Brother   . Cancer Maternal Grandmother     breast cancer  . Diabetes Paternal Grandmother   . Asthma Neg Hx   :  Social History   Social History  . Marital status: Married    Spouse name: N/A  . Number of children: 1  . Years of education: N/A   Occupational History  . Retired     Government social research officer Asst   Social History Main Topics  . Smoking status: Former Smoker    Packs/day: 1.00    Years: 50.00    Types: Cigarettes    Quit date: 09/27/2011  . Smokeless tobacco: Never Used  . Alcohol use Yes     Comment: winw rarely  . Drug use: No  . Sexual activity: Not on file   Other Topics Concern  . Not on file   Social History Narrative  . No narrative on file  :  Pertinent items are noted in HPI.  Exam: Blood pressure 132/68, pulse 83, temperature 98.3 F (36.8 C), temperature source Oral, resp. rate 18, SpO2 100 %. General appearance: alert and cooperative Head: Normocephalic, without obvious abnormality Throat:  lips, mucosa, and tongue normal; teeth and gums normal Neck: no adenopathy Back: negative Resp: clear to auscultation bilaterally Chest wall: no tenderness Cardio: regular rate and rhythm, S1, S2 normal, no murmur, click, rub or gallop GI: soft, non-tender; bowel sounds normal; no masses,  no organomegaly Extremities: extremities normal, atraumatic, no cyanosis or edema Skin: Skin color, texture, turgor normal. No rashes or lesions Lymph nodes: Cervical, supraclavicular, and axillary nodes normal.  CBC    Component Value Date/Time   WBC 8.8 11/23/2015 1120   RBC 4.26 11/23/2015 1120   HGB 13.3 11/23/2015 1120   HCT 40.0 11/23/2015 1120   PLT 302 11/23/2015 1120   MCV 93.9 11/23/2015 1120   MCH 31.2 11/23/2015 1120   MCHC 33.3 11/23/2015 1120   RDW 12.7 11/23/2015 1120   LYMPHSABS 0.9 02/21/2015 1409   MONOABS 0.7 02/21/2015 1409   EOSABS 0.1 02/21/2015 1409   BASOSABS 0.0 02/21/2015 1409     Chemistry      Component Value Date/Time   NA 142 11/23/2015 1120   K 5.0 11/23/2015 1120   CL 108 11/23/2015 1120   CO2 27 11/23/2015 1120   BUN 23 (H) 11/23/2015 1120   CREATININE 1.00 11/23/2015 1120      Component Value Date/Time   CALCIUM 9.7 11/23/2015 1120   ALKPHOS 90 02/21/2015 1409   AST 18 02/21/2015 1409   ALT 13 02/21/2015 1409   BILITOT 0.4 02/21/2015 1409      Assessment and Plan:    82 year old woman with the following issues:  1. Superficial bladder tumor that has been diagnosed dating back to 2013. 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 She is status post multiple TURBT and BCG treatment.  Her most recent cystoscopy on 11/27/2015 showed high-grade urothelial carcinoma without invasion indicating high-grade  TA multifocal disease. She had imaging studies in 2016 without evidence to suggest metastatic disease.  The natural course of this disease was reviewed as well as different treatment options.  Intravesicular mitomycin-C administered at 40 mg weekly for total 6 treatments is an option. Complications associated with this treatment were reviewed including cystitis, dysuria and rarely bladder damage. Very little to no systemic mitomycin-C absorption is anticipated and do not anticipate any stomach complications related to mitomycin. She understands if she progresses on this treatment, salvage cystectomy might be needed.  After discussion today she is agreeable to proceed with this treatment after chemotherapy education class. She is scheduled to have a repeat cystoscopy in November 2017.  2. Weight loss: Unclear etiology and I do not think is related to her bladder tumor. This will be watched moving forward.  3. Follow-up: Will be in 6 weeks to follow her progress.

## 2016-01-08 ENCOUNTER — Other Ambulatory Visit: Payer: Medicare Other

## 2016-01-08 ENCOUNTER — Encounter: Payer: Self-pay | Admitting: *Deleted

## 2016-01-10 ENCOUNTER — Other Ambulatory Visit: Payer: Self-pay | Admitting: *Deleted

## 2016-01-10 DIAGNOSIS — C675 Malignant neoplasm of bladder neck: Secondary | ICD-10-CM

## 2016-01-11 ENCOUNTER — Ambulatory Visit (HOSPITAL_BASED_OUTPATIENT_CLINIC_OR_DEPARTMENT_OTHER): Payer: Medicare Other

## 2016-01-11 ENCOUNTER — Other Ambulatory Visit (HOSPITAL_BASED_OUTPATIENT_CLINIC_OR_DEPARTMENT_OTHER): Payer: Medicare Other

## 2016-01-11 VITALS — BP 130/63 | HR 94 | Temp 97.9°F | Resp 18

## 2016-01-11 DIAGNOSIS — Z5111 Encounter for antineoplastic chemotherapy: Secondary | ICD-10-CM | POA: Diagnosis not present

## 2016-01-11 DIAGNOSIS — C675 Malignant neoplasm of bladder neck: Secondary | ICD-10-CM

## 2016-01-11 DIAGNOSIS — C679 Malignant neoplasm of bladder, unspecified: Secondary | ICD-10-CM

## 2016-01-11 LAB — CBC & DIFF AND RETIC
BASO%: 0.8 % (ref 0.0–2.0)
BASOS ABS: 0.1 10*3/uL (ref 0.0–0.1)
EOS ABS: 0.1 10*3/uL (ref 0.0–0.5)
EOS%: 0.8 % (ref 0.0–7.0)
HEMATOCRIT: 40.2 % (ref 34.8–46.6)
HEMOGLOBIN: 13.6 g/dL (ref 11.6–15.9)
IMMATURE RETIC FRACT: 5 % (ref 1.60–10.00)
LYMPH#: 1.7 10*3/uL (ref 0.9–3.3)
LYMPH%: 22.2 % (ref 14.0–49.7)
MCH: 31.1 pg (ref 25.1–34.0)
MCHC: 33.8 g/dL (ref 31.5–36.0)
MCV: 91.8 fL (ref 79.5–101.0)
MONO#: 0.6 10*3/uL (ref 0.1–0.9)
MONO%: 8 % (ref 0.0–14.0)
NEUT#: 5.3 10*3/uL (ref 1.5–6.5)
NEUT%: 68.2 % (ref 38.4–76.8)
Platelets: 266 10*3/uL (ref 145–400)
RBC: 4.38 10*6/uL (ref 3.70–5.45)
RDW: 13.2 % (ref 11.2–14.5)
RETIC %: 1.21 % (ref 0.70–2.10)
Retic Ct Abs: 53 10*3/uL (ref 33.70–90.70)
WBC: 7.8 10*3/uL (ref 3.9–10.3)

## 2016-01-11 LAB — COMPREHENSIVE METABOLIC PANEL
ALBUMIN: 3.6 g/dL (ref 3.5–5.0)
ALK PHOS: 76 U/L (ref 40–150)
ALT: 15 U/L (ref 0–55)
AST: 22 U/L (ref 5–34)
Anion Gap: 11 mEq/L (ref 3–11)
BILIRUBIN TOTAL: 0.54 mg/dL (ref 0.20–1.20)
BUN: 16.5 mg/dL (ref 7.0–26.0)
CALCIUM: 9.9 mg/dL (ref 8.4–10.4)
CO2: 26 mEq/L (ref 22–29)
CREATININE: 0.9 mg/dL (ref 0.6–1.1)
Chloride: 103 mEq/L (ref 98–109)
EGFR: 62 mL/min/{1.73_m2} — ABNORMAL LOW (ref >90)
GLUCOSE: 96 mg/dL (ref 70–140)
POTASSIUM: 3.9 meq/L (ref 3.5–5.1)
SODIUM: 140 meq/L (ref 136–145)
TOTAL PROTEIN: 6.8 g/dL (ref 6.4–8.3)

## 2016-01-11 MED ORDER — LIDOCAINE HCL 2 % EX GEL
Freq: Once | CUTANEOUS | Status: DC
  Filled 2016-01-11: qty 20

## 2016-01-11 MED ORDER — PROCHLORPERAZINE MALEATE 10 MG PO TABS
10.0000 mg | ORAL_TABLET | Freq: Once | ORAL | Status: AC
  Administered 2016-01-11: 10 mg via ORAL

## 2016-01-11 MED ORDER — MITOMYCIN CHEMO FOR BLADDER INSTILLATION 40 MG
40.0000 mg | Freq: Once | INTRAVENOUS | Status: AC
  Administered 2016-01-11: 40 mg via INTRAVESICAL
  Filled 2016-01-11: qty 40

## 2016-01-11 MED ORDER — PROCHLORPERAZINE MALEATE 10 MG PO TABS
ORAL_TABLET | ORAL | Status: AC
  Filled 2016-01-11: qty 1

## 2016-01-11 NOTE — Patient Instructions (Signed)
[facility] Discharge Instructions for Patients Receiving Chemotherapy  Today you received the following chemotherapy agents: Mitomycin   To help prevent nausea and vomiting after your treatment, we encourage you to take your nausea medication as directed.    If you develop nausea and vomiting that is not controlled by your nausea medication, call the clinic.   BELOW ARE SYMPTOMS THAT SHOULD BE REPORTED IMMEDIATELY:  *FEVER GREATER THAN 100.5 F  *CHILLS WITH OR WITHOUT FEVER  NAUSEA AND VOMITING THAT IS NOT CONTROLLED WITH YOUR NAUSEA MEDICATION  *UNUSUAL SHORTNESS OF BREATH  *UNUSUAL BRUISING OR BLEEDING  TENDERNESS IN MOUTH AND THROAT WITH OR WITHOUT PRESENCE OF ULCERS  *URINARY PROBLEMS  *BOWEL PROBLEMS  UNUSUAL RASH Items with * indicate a potential emergency and should be followed up as soon as possible.  Feel free to call the clinic you have any questions or concerns. The clinic phone number is [phone].  Please show the [facility] at check-in to the Emergency Department and triage nurse.

## 2016-01-11 NOTE — Progress Notes (Signed)
Per Dr. Alen Blew, do not have to wait on labs to begin treatment. And per Dr. Alen Blew pt does not need PIV or NS infusion this treatment of further treatments. Pharmacy aware and NS removed from plan per Dr. Alen Blew.  1115: Foley catheter placed with positive clear yellow output. Pt verbalize having some discomfort during procedure, otherwise pt tolerated procedure well. NO discomfort noted after foley in place.  1124: Foley clamped and Mitomycin push started via foley.  1130: Mitomycin instilled into bladder via foley catheter. Pt tolerated treatment well. Pt educated to turn from side to side and front to back every 15 minutes. Pt verbalizes understanding.  1304: Pt reports having the "urge to go to the bathroom". Foley unclamped and draining to gravity.  1319: 400cc light brownish urine drained from foley. Foley Catheter removed and disposed of properly. Pt tolerated procedure well. Discharge instructions reviewed with pt, pt verbalizes understanding.

## 2016-01-14 ENCOUNTER — Telehealth: Payer: Self-pay | Admitting: *Deleted

## 2016-01-14 NOTE — Telephone Encounter (Signed)
-----   Message from Egbert Garibaldi, RN sent at 01/11/2016  1:48 PM EDT ----- Regarding: Dr. Pablo Ledger f/u call  Pt of Dr. Alen Blew. First time Mitomycin (Bladder Instillation). Pt tolerated well.

## 2016-01-14 NOTE — Telephone Encounter (Signed)
Called patient to follow up after first chemotherapy treatment. Left message on answering machine for her to call [facility] and let this office know how she did over the weekend.

## 2016-01-17 ENCOUNTER — Other Ambulatory Visit: Payer: Medicare Other

## 2016-01-17 ENCOUNTER — Ambulatory Visit (HOSPITAL_BASED_OUTPATIENT_CLINIC_OR_DEPARTMENT_OTHER): Payer: Medicare Other

## 2016-01-17 VITALS — BP 146/73 | HR 75 | Temp 97.9°F | Resp 18

## 2016-01-17 DIAGNOSIS — C679 Malignant neoplasm of bladder, unspecified: Secondary | ICD-10-CM

## 2016-01-17 DIAGNOSIS — Z5111 Encounter for antineoplastic chemotherapy: Secondary | ICD-10-CM

## 2016-01-17 MED ORDER — PROCHLORPERAZINE MALEATE 10 MG PO TABS
ORAL_TABLET | ORAL | Status: AC
  Filled 2016-01-17: qty 1

## 2016-01-17 MED ORDER — PROCHLORPERAZINE MALEATE 10 MG PO TABS
10.0000 mg | ORAL_TABLET | Freq: Once | ORAL | Status: AC
  Administered 2016-01-17: 10 mg via ORAL

## 2016-01-17 MED ORDER — MITOMYCIN CHEMO FOR BLADDER INSTILLATION 40 MG
40.0000 mg | Freq: Once | INTRAVENOUS | Status: AC
  Administered 2016-01-17: 40 mg via INTRAVESICAL
  Filled 2016-01-17: qty 40

## 2016-01-17 MED ORDER — LIDOCAINE HCL 2 % EX GEL
Freq: Once | CUTANEOUS | Status: AC
  Administered 2016-01-17: 14:00:00 via URETHRAL
  Filled 2016-01-17: qty 20

## 2016-01-17 NOTE — Progress Notes (Signed)
1355: Mitomycin instilled via Foley Catheter. Pt educated to turn side to side and on belly and back every 15 minutes. Pt verbalizes understanding.

## 2016-01-17 NOTE — Progress Notes (Signed)
Pt receiving mitomycin via 35F foley catheter from 1355 to 1535. Foley d/c. Pt not complaining of any S/E. AVS printed.

## 2016-01-17 NOTE — Patient Instructions (Signed)
[facility] Discharge Instructions for Patients Receiving Chemotherapy  Today you received the following chemotherapy agents: Mitomycin   To help prevent nausea and vomiting after your treatment, we encourage you to take your nausea medication as directed.    If you develop nausea and vomiting that is not controlled by your nausea medication, call the clinic.   BELOW ARE SYMPTOMS THAT SHOULD BE REPORTED IMMEDIATELY:  *FEVER GREATER THAN 100.5 F  *CHILLS WITH OR WITHOUT FEVER  NAUSEA AND VOMITING THAT IS NOT CONTROLLED WITH YOUR NAUSEA MEDICATION  *UNUSUAL SHORTNESS OF BREATH  *UNUSUAL BRUISING OR BLEEDING  TENDERNESS IN MOUTH AND THROAT WITH OR WITHOUT PRESENCE OF ULCERS  *URINARY PROBLEMS  *BOWEL PROBLEMS  UNUSUAL RASH Items with * indicate a potential emergency and should be followed up as soon as possible.  Feel free to call the clinic you have any questions or concerns. The clinic phone number is [phone].  Please show the [facility] at check-in to the Emergency Department and triage nurse.

## 2016-01-22 ENCOUNTER — Ambulatory Visit (INDEPENDENT_AMBULATORY_CARE_PROVIDER_SITE_OTHER): Payer: Medicare Other | Admitting: Internal Medicine

## 2016-01-22 ENCOUNTER — Encounter: Payer: Self-pay | Admitting: Internal Medicine

## 2016-01-22 VITALS — BP 112/60 | HR 87 | Ht 63.0 in | Wt 95.6 lb

## 2016-01-22 DIAGNOSIS — J449 Chronic obstructive pulmonary disease, unspecified: Secondary | ICD-10-CM

## 2016-01-22 MED ORDER — BUDESONIDE-FORMOTEROL FUMARATE 160-4.5 MCG/ACT IN AERO: 2.0000 | INHALATION_SPRAY | Freq: Two times a day (BID) | RESPIRATORY_TRACT | 0 refills | Status: DC

## 2016-01-22 NOTE — Progress Notes (Signed)
Subjective:    Patient ID: Amber Snow, female    DOB: [birth year]  MRN: [id number]   Brief patient profile:  15  yowf with GOLD II copd by pfts 07/2009 quit smoking June 2013  due to progressive doe x 5 years worse since quit referred to pulmonary clinic 06/17/2012 by Dr Ignacia Palma    History of Present Illness  07/10/2015  f/u ov/[name] re: GOLD II/ could not afford bevespi and not convinced better than symbicort   Chief Complaint  Patient presents with  . Follow-up    Increased SOB x 2 wks. She has not felt well in general. She also c/o cough- mainly non prod. She is using proair once daily on average.   onset gradually worse x 2 weeks with any activity >  ? If better with albuterol ? Better with prednisone and feels worse off it in terms of energy but not cough/sob which is worse with use of arms. >>cont on Symbicort and Spiriva   08/09/2015 Follow up : [name]/COPD GOLD II  Pt returns for 1 month follow up and med review  Did not bring her meds today. She was upset that she is not seeing Dr. Melvyn Novas  Today.  Offered to reschedule x 2 but pt declined.  Complains of that her mouth is very sore.  Says can not tolerate Spiriva . Dries her out way too much , causes her tongue to be too sore.  Is taking Symbicort Twice daily  , says she rinses well after use.  Complains of 1 week of increased cough, congestion with green mucus. More sob than usual. No wheezing , chest pain, hemoptysis or fever.  Followed by Urology for bladder cancer . Had cystoscopy 07/31/15  Rec Doxycycline 100 mg twice daily for 1 week, take with food. Use sunscreen. If outside as this antibiotic may cause sunburn Mucinex DM twice daily as needed for cough and congestion. Mycelex lozenges 5 times daily for 1 week Continue on Symbicort 2 puffs twice daily, rinse after each use   11/08/2015  f/u ov/[name] re: GOLD II copd/ symbicort 160 2bid / no med calendar  Chief Complaint  Patient presents with  . Follow-up   Breathing is some worse- relates to humid weather. Cough is prod with white sputum. She has noticed increased wheezing. She is using albuterol once per wk on average.   sleeping ok/ note aecopd @ last ov and responded back to baseline  Ok walking indoors = MMRC2 = can't walk a nl pace on a flat grade s sob but does fine slow and flat eg [employer] but not mall  C/o swelling in legs/ dependent daytime despite cozaar as her bp med and using lasix 20 mg prn  rec Discuss with Dr Alain Marion add hctz to your cozar  No change in pulmonary medications     01/22/2016  f/u ov/[name] re: GOLD II  copd/ symbicort 160 2bid  Chief Complaint  Patient presents with  . Follow-up    c/o sob worse 3-4 days ago,was using Albuterol 1x day, little better now,cough-thick white,chest tightness,denies cp,  no change ex tol Never took more than one proair daily and improved since flare of cough  Doe still  = mmrc 2     No obvious day to day or daytime variability or assoc   mucus plugs or hemoptysis or cp or  subjective wheeze or overt sinus or hb symptoms. No unusual exp hx or h/o childhood pna/ asthma or knowledge of premature  birth.  Sleeping ok without nocturnal  or early am exacerbation  of respiratory  c/o's or need for noct saba. Also denies any obvious fluctuation of symptoms with weather or environmental changes or other aggravating or alleviating factors except as outlined above   Current Medications, Allergies, Complete Past Medical History, Past Surgical History, Family History, and Social History were reviewed in [employer] record.  ROS  The following are not active complaints unless bolded sore throat, dysphagia, dental problems, itching, sneezing,  nasal congestion or excess/ purulent secretions, ear ache,   fever, chills, sweats, unintended wt loss, classically pleuritic or exertional cp,  orthopnea pnd or leg swelling better , presyncope, palpitations, abdominal pain, anorexia,  nausea, vomiting, diarrhea  or change in bowel or bladder habits, change in stools or urine, dysuria,hematuria,  rash, arthralgias, visual complaints, headache, numbness, weakness or ataxia or problems with walking or coordination,  change in mood/affect or memory.                       Objective:   Physical Exam  Thin  amb anxious  wf nad  07/29/2012  105  >102 09/24/2012 > 10/26/2012  99 > 12/10/2012 99 > 104 03/14/2013 > 06/20/2013 107 > 109 07/28/2013 > 11/09/2013  109 > 11/10/2014   113 > 03/13/2015  113 > 04/10/2015   113 > 07/10/2015  114 > 11/08/2015   103 > 01/22/2016 96      Vital signs reviewed   - 02 sats 95% on RA on arrival   HEENT mild turbinate edema.  Oropharynx with dryness noted, post pharynx with few white scattered patches c/w thrush. .  No JVD or cervical adenopathy. Mild accessory muscle hypertrophy. Trachea midline, nl thryroid. Chest was hyperinflated by percussion with diminished breath sounds and moderate increased exp time without wheeze. Hoover sign positive at mid inspiration. Regular rate and rhythm without murmur gallop or rub or increase P2 -  No LE pitting edema    Abd: no hsm, nl excursion. Ext warm without cyanosis or clubbing.                Assessment & Plan:

## 2016-01-22 NOTE — Patient Instructions (Addendum)
Plan A = Automatic = Symbicort 160 Take 2 puffs first thing in am and then another 2 puffs about 12 hours later.     Plan B = Backup Only use your albuterol as a rescue medication to be used if you can't catch your breath by resting or doing a relaxed purse lip breathing pattern.  - The less you use it, the better it will work when you need it. - Ok to use the inhaler up to 2 puffs  every 4 hours if you must but call for appointment if use goes up over your usual need - Don't leave home without it !!  (think of it like the spare tire for your car)   Keep your previous follow up appt with me - call sooner if needed

## 2016-01-22 NOTE — Assessment & Plan Note (Signed)
-   PFT's 07/31/09 FEV1  1.36 (74%) ratio 45 and no better p B2,  DLCO 66%  - quit smoking June 2013 - PFT's 07/29/2012 FEV1 1.10 (63%) ratio 46 and no change p B2 and DLCO 57%  - 10/26/2012  Walked RA x 3 laps @ 185 ft each stopped due to end of study, no desats - Trial off spiriva 03/14/2013 > rechallenged 06/20/12 due to worse doe > d/cd around 06/26/13 and no worse off it  - 03/13/2015 trained on respimat > 90% effecitve > changed to stiolto 2 pffs each am > preferred symbicort so 04/10/2015 rec bevespi trial - 04/10/2015  extensive coaching HFA effectiveness =    75%  - 04/10/2015  Walked RA x 3 laps @ 185 ft each stopped due to  End of study, nl pace, no sob or desat/ did c/o leg fatigue bilaterally  - 07/10/2015   try spiriva respimat 2 each am > unable to tolerate mouth effects so d/c'd 08/2015   Despite severity and inability to use lama she's done well including self managing recent flare with minimal saba   01/22/2016  After extensive coaching HFA effectiveness =    75% so continue symbicort 160 and prn saba  I had an extended discussion with the patient reviewing all relevant studies completed to date and  lasting 15 to 20 minutes of a 25 minute visit    Each maintenance medication was reviewed in detail including most importantly the difference between maintenance and prns and under what circumstances the prns are to be triggered using an action plan format that is not reflected in the computer generated alphabetically organized AVS.    Please see instructions for details which were reviewed in writing and the patient given a copy highlighting the part that I personally wrote and discussed at today's ov.

## 2016-01-23 ENCOUNTER — Other Ambulatory Visit: Payer: Self-pay | Admitting: Cardiology

## 2016-01-24 ENCOUNTER — Other Ambulatory Visit: Payer: Medicare Other

## 2016-01-24 ENCOUNTER — Ambulatory Visit (HOSPITAL_BASED_OUTPATIENT_CLINIC_OR_DEPARTMENT_OTHER): Payer: Medicare Other

## 2016-01-24 VITALS — BP 138/69 | HR 69 | Temp 98.9°F | Resp 18

## 2016-01-24 DIAGNOSIS — Z5111 Encounter for antineoplastic chemotherapy: Secondary | ICD-10-CM

## 2016-01-24 DIAGNOSIS — C679 Malignant neoplasm of bladder, unspecified: Secondary | ICD-10-CM | POA: Diagnosis not present

## 2016-01-24 MED ORDER — LIDOCAINE HCL 2 % EX GEL
Freq: Once | CUTANEOUS | Status: DC
  Filled 2016-01-24: qty 20

## 2016-01-24 MED ORDER — PROCHLORPERAZINE MALEATE 10 MG PO TABS
ORAL_TABLET | ORAL | Status: AC
  Filled 2016-01-24: qty 1

## 2016-01-24 MED ORDER — MITOMYCIN CHEMO FOR BLADDER INSTILLATION 40 MG
40.0000 mg | Freq: Once | INTRAVENOUS | Status: AC
  Administered 2016-01-24: 40 mg via INTRAVESICAL
  Filled 2016-01-24: qty 40

## 2016-01-24 MED ORDER — PROCHLORPERAZINE MALEATE 10 MG PO TABS
10.0000 mg | ORAL_TABLET | Freq: Once | ORAL | Status: AC
  Administered 2016-01-24: 10 mg via ORAL

## 2016-01-24 NOTE — Progress Notes (Signed)
At 14:30 foley cath inserted #14 french, pt tolerated well.

## 2016-01-24 NOTE — Patient Instructions (Signed)
[facility] Discharge Instructions for Patients Receiving Chemotherapy  Today you received the following chemotherapy agents Mitomycin  To help prevent nausea and vomiting after your treatment, we encourage you to take your nausea medication as directed.    If you develop nausea and vomiting that is not controlled by your nausea medication, call the clinic.   BELOW ARE SYMPTOMS THAT SHOULD BE REPORTED IMMEDIATELY:  *FEVER GREATER THAN 100.5 F  *CHILLS WITH OR WITHOUT FEVER  NAUSEA AND VOMITING THAT IS NOT CONTROLLED WITH YOUR NAUSEA MEDICATION  *UNUSUAL SHORTNESS OF BREATH  *UNUSUAL BRUISING OR BLEEDING  TENDERNESS IN MOUTH AND THROAT WITH OR WITHOUT PRESENCE OF ULCERS  *URINARY PROBLEMS  *BOWEL PROBLEMS  UNUSUAL RASH Items with * indicate a potential emergency and should be followed up as soon as possible.  Feel free to call the clinic you have any questions or concerns. The clinic phone number is [phone].  Please show the [place] at check-in to the Emergency Department and triage nurse.

## 2016-01-24 NOTE — Progress Notes (Signed)
1645 foley catheter unclamped.  Approximately 400cc urine drained into foley.  Foley balloon drained in 10cc syringe.  Foley removed without difficulty.  No c/o from patient after removal of foley.  Pt d/c'd

## 2016-01-24 NOTE — Progress Notes (Signed)
Mitomycin administered through 14Fr Foley at 1445. See MAR. Patient tolerated well with no complaints. Patient instructed to turn side to side every 15 minutes. Patient verbalized understanding.

## 2016-01-28 ENCOUNTER — Telehealth: Payer: Self-pay | Admitting: *Deleted

## 2016-01-28 NOTE — Telephone Encounter (Signed)
Call from pt reporting L flank pain. Pain began Saturday and has been constant since 10/1. Pt also reports a general unwell feeling. Denies fever. No nausea. She is tolerating POs though she reports decreased appetite due to pain. Pt reports a history of kidney stones.  Message forwarded to collaborative RN.

## 2016-01-28 NOTE — Telephone Encounter (Signed)
This RN spoke with patient regarding left, flank pain. Patient stated,"I have a history of kidney stones. It sort of feels like that kind of pain." Asked patient who is her urologist and she said, "Dr. Junious Silk.".  I asked if she called [facility] and she said, "no". Patient stated,"I thought Dr. Alen Blew would take care of all my problems, since I'm under his care." Informed her that Dr. Alen Blew was out of the office today. Patient did say that the pain had "gotten better" and she did not want to come in to [facility]. Informed patient if the pain got severe to go to the ER and be evaluated. Patient verbalized understanding.

## 2016-01-29 DIAGNOSIS — R1084 Generalized abdominal pain: Secondary | ICD-10-CM | POA: Diagnosis not present

## 2016-01-29 DIAGNOSIS — C672 Malignant neoplasm of lateral wall of bladder: Secondary | ICD-10-CM | POA: Diagnosis not present

## 2016-01-29 DIAGNOSIS — N200 Calculus of kidney: Secondary | ICD-10-CM | POA: Diagnosis not present

## 2016-01-31 ENCOUNTER — Other Ambulatory Visit: Payer: Medicare Other

## 2016-01-31 ENCOUNTER — Ambulatory Visit (HOSPITAL_BASED_OUTPATIENT_CLINIC_OR_DEPARTMENT_OTHER): Payer: Medicare Other

## 2016-01-31 VITALS — BP 130/82 | HR 73 | Temp 98.9°F | Resp 18

## 2016-01-31 DIAGNOSIS — C679 Malignant neoplasm of bladder, unspecified: Secondary | ICD-10-CM | POA: Diagnosis not present

## 2016-01-31 DIAGNOSIS — Z5111 Encounter for antineoplastic chemotherapy: Secondary | ICD-10-CM | POA: Diagnosis not present

## 2016-01-31 MED ORDER — MITOMYCIN CHEMO FOR BLADDER INSTILLATION 40 MG
40.0000 mg | Freq: Once | INTRAVENOUS | Status: AC
  Administered 2016-01-31: 40 mg via INTRAVESICAL
  Filled 2016-01-31: qty 40

## 2016-01-31 MED ORDER — PROCHLORPERAZINE MALEATE 10 MG PO TABS
10.0000 mg | ORAL_TABLET | Freq: Once | ORAL | Status: AC
  Administered 2016-01-31: 10 mg via ORAL

## 2016-01-31 MED ORDER — PROCHLORPERAZINE MALEATE 10 MG PO TABS
ORAL_TABLET | ORAL | Status: AC
  Filled 2016-01-31: qty 1

## 2016-01-31 NOTE — Progress Notes (Signed)
Patient declined flu vaccination she states that she already took it this year.

## 2016-01-31 NOTE — Progress Notes (Signed)
14 Fr Foley catheter placed, urine return present.  Foley emptied, and clamped. Mitomycin instilled at 1340, patient tolerated well. Was about to change positions every 15 minutes, unable to lay prone.  Mitomycin was removed at 1550, total 500 output returned. Patient tolerated procedure well.

## 2016-01-31 NOTE — Patient Instructions (Signed)
[facility] Discharge Instructions for Patients Receiving Chemotherapy  Today you received the following chemotherapy agents Mitomycin  To help prevent nausea and vomiting after your treatment, we encourage you to take your nausea medication as directed.    If you develop nausea and vomiting that is not controlled by your nausea medication, call the clinic.   BELOW ARE SYMPTOMS THAT SHOULD BE REPORTED IMMEDIATELY:  *FEVER GREATER THAN 100.5 F  *CHILLS WITH OR WITHOUT FEVER  NAUSEA AND VOMITING THAT IS NOT CONTROLLED WITH YOUR NAUSEA MEDICATION  *UNUSUAL SHORTNESS OF BREATH  *UNUSUAL BRUISING OR BLEEDING  TENDERNESS IN MOUTH AND THROAT WITH OR WITHOUT PRESENCE OF ULCERS  *URINARY PROBLEMS  *BOWEL PROBLEMS  UNUSUAL RASH Items with * indicate a potential emergency and should be followed up as soon as possible.  Feel free to call the clinic you have any questions or concerns. The clinic phone number is [phone].  Please show the [place] at check-in to the Emergency Department and triage nurse.

## 2016-02-06 ENCOUNTER — Encounter (INDEPENDENT_AMBULATORY_CARE_PROVIDER_SITE_OTHER): Payer: Medicare Other | Admitting: Physical Medicine and Rehabilitation

## 2016-02-06 ENCOUNTER — Encounter: Payer: Self-pay | Admitting: Internal Medicine

## 2016-02-06 ENCOUNTER — Ambulatory Visit (INDEPENDENT_AMBULATORY_CARE_PROVIDER_SITE_OTHER): Payer: Medicare Other | Admitting: Internal Medicine

## 2016-02-06 ENCOUNTER — Telehealth: Payer: Self-pay | Admitting: Internal Medicine

## 2016-02-06 DIAGNOSIS — M79604 Pain in right leg: Secondary | ICD-10-CM

## 2016-02-06 DIAGNOSIS — I2510 Atherosclerotic heart disease of native coronary artery without angina pectoris: Secondary | ICD-10-CM

## 2016-02-06 DIAGNOSIS — I428 Other cardiomyopathies: Secondary | ICD-10-CM

## 2016-02-06 DIAGNOSIS — I5022 Chronic systolic (congestive) heart failure: Secondary | ICD-10-CM

## 2016-02-06 DIAGNOSIS — M5010 Cervical disc disorder with radiculopathy, unspecified cervical region: Secondary | ICD-10-CM

## 2016-02-06 DIAGNOSIS — C679 Malignant neoplasm of bladder, unspecified: Secondary | ICD-10-CM

## 2016-02-06 DIAGNOSIS — M545 Low back pain: Secondary | ICD-10-CM

## 2016-02-06 DIAGNOSIS — M5415 Radiculopathy, thoracolumbar region: Secondary | ICD-10-CM

## 2016-02-06 DIAGNOSIS — J449 Chronic obstructive pulmonary disease, unspecified: Secondary | ICD-10-CM

## 2016-02-06 MED ORDER — BUDESONIDE-FORMOTEROL FUMARATE 160-4.5 MCG/ACT IN AERO: INHALATION_SPRAY | RESPIRATORY_TRACT | 0 refills | Status: DC

## 2016-02-06 NOTE — Progress Notes (Signed)
Subjective:  Patient ID: Amber Snow, female    DOB: 12/10/1932  Age: 82 y.o. MRN: [id number]  CC: No chief complaint on file.   HPI Amber Snow presents for COPD, CHF, bladder Ca, anxiety f/u. SOB w/stress  Outpatient Medications Prior to Visit  Medication Sig Dispense Refill  . albuterol (PROAIR HFA) 108 (90 BASE) MCG/ACT inhaler Inhale 1-2 puffs into the lungs every 4 (four) hours as needed for wheezing or shortness of breath. 1 Inhaler 10  . ALPRAZolam (XANAX) 0.25 MG tablet TAKE 1/2 TO 1 TABLET BY MOUTH TWICE A DAY AS NEEDED FOR ANXIETY (SHORTNESS OF BREATH) 60 tablet 2  . baclofen (LIORESAL) 10 MG tablet Take 10 mg by mouth 3 (three) times daily as needed for muscle spasms.     Marland Kitchen BIOTIN PO Take 1 capsule by mouth daily.    . Cholecalciferol (VITAMIN D) 1000 UNITS capsule Take 1,000 Units by mouth daily.     . famotidine (PEPCID) 20 MG tablet Take 20 mg by mouth 2 (two) times daily.    Marland Kitchen ibuprofen (ADVIL,MOTRIN) 200 MG tablet Take 600-800 mg by mouth every 6 (six) hours as needed for moderate pain.    Marland Kitchen lidocaine (LIDODERM) 5 % Place 1 patch onto the skin daily as needed (NECK PAIN). Remove & Discard patch within 12 hours or as directed by MD    . losartan-hydrochlorothiazide (HYZAAR) 50-12.5 MG tablet Take 1 tablet by mouth daily. 90 tablet 3  . traZODone (DESYREL) 50 MG tablet TAKE 1 TABLET BY MOUTH EVERY DAY (Patient taking differently: TAKE 1 TABLET BY MOUTH EVERY DAY AT BEDTIME) 90 tablet 2  . zoledronic acid (RECLAST) 5 MG/100ML SOLN Inject 5 mg into the vein once. Every year    . budesonide-formoterol (SYMBICORT) 160-4.5 MCG/ACT inhaler INHALE 2 PUFFS INTO THE LUNGS TWICE A DAY 1 Inhaler 0  . budesonide-formoterol (SYMBICORT) 160-4.5 MCG/ACT inhaler Inhale 2 puffs into the lungs 2 (two) times daily. 1 Inhaler 0   No facility-administered medications prior to visit.     ROS Review of Systems  Constitutional: Positive for fever. Negative for activity change,  appetite change, chills, fatigue and unexpected weight change.  HENT: Negative for congestion, mouth sores and sinus pressure.   Eyes: Negative for visual disturbance.  Respiratory: Positive for shortness of breath. Negative for cough and chest tightness.   Gastrointestinal: Negative for abdominal pain and nausea.  Genitourinary: Negative for difficulty urinating, frequency and vaginal pain.  Musculoskeletal: Negative for back pain and gait problem.  Skin: Negative for pallor and rash.  Neurological: Negative for dizziness, tremors, weakness, numbness and headaches.  Psychiatric/Behavioral: Negative for confusion and sleep disturbance.    Objective:  BP 136/72   Pulse 87   Wt 95 lb (43.1 kg)   SpO2 95%   BMI 16.83 kg/m   BP Readings from Last 3 Encounters:  02/06/16 136/72  01/31/16 130/82  01/24/16 138/69    Wt Readings from Last 3 Encounters:  02/06/16 95 lb (43.1 kg)  01/22/16 95 lb 9.6 oz (43.4 kg)  01/04/16 96 lb (43.5 kg)    Physical Exam  Constitutional: She appears well-developed. No distress.  HENT:  Head: Normocephalic.  Right Ear: External ear normal.  Left Ear: External ear normal.  Nose: Nose normal.  Mouth/Throat: Oropharynx is clear and moist.  Eyes: Conjunctivae are normal. Pupils are equal, round, and reactive to light. Right eye exhibits no discharge. Left eye exhibits no discharge.  Neck: Normal range of motion. Neck  supple. No JVD present. No tracheal deviation present. No thyromegaly present.  Cardiovascular: Normal rate, regular rhythm and normal heart sounds.   Pulmonary/Chest: No stridor. No respiratory distress. She has no wheezes.  Abdominal: Soft. Bowel sounds are normal. She exhibits no distension and no mass. There is no tenderness. There is no rebound and no guarding.  Musculoskeletal: She exhibits no edema or tenderness.  Lymphadenopathy:    She has no cervical adenopathy.  Neurological: She displays normal reflexes. No cranial nerve  deficit. She exhibits normal muscle tone. Coordination normal.  Skin: No rash noted. No erythema.  Psychiatric: She has a normal mood and affect. Her behavior is normal. Judgment and thought content normal.  Thin  Lab Results  Component Value Date   WBC 7.8 01/11/2016   HGB 13.6 01/11/2016   HCT 40.2 01/11/2016   PLT 266 01/11/2016   GLUCOSE 96 01/11/2016   CHOL 221 (H) 07/06/2013   TRIG 200.0 (H) 07/06/2013   HDL 60.90 07/06/2013   LDLDIRECT 126.5 06/14/2012   LDLCALC 120 (H) 07/06/2013   ALT 15 01/11/2016   AST 22 01/11/2016   NA 140 01/11/2016   K 3.9 01/11/2016   CL 108 11/23/2015   CREATININE 0.9 01/11/2016   BUN 16.5 01/11/2016   CO2 26 01/11/2016   TSH 1.21 11/08/2014   INR 1.0 09/04/2011   HGBA1C 5.8 (H) 06/04/2012    No results found.  Assessment & Plan:   There are no diagnoses linked to this encounter. I am having [name] maintain her Vitamin D, BIOTIN PO, zoledronic acid, baclofen, ibuprofen, albuterol, famotidine, traZODone, lidocaine, ALPRAZolam, losartan-hydrochlorothiazide, and budesonide-formoterol.  No orders of the defined types were placed in this encounter.    Follow-up: No Follow-up on file.  Walker Kehr, MD

## 2016-02-06 NOTE — Assessment & Plan Note (Signed)
-  F/u w/Dr Melvyn Novas  Symbicort MDI

## 2016-02-06 NOTE — Telephone Encounter (Signed)
Sample of Symbicort was given to pt  Nothing further needed

## 2016-02-06 NOTE — Assessment & Plan Note (Signed)
Losartan 

## 2016-02-06 NOTE — Assessment & Plan Note (Signed)
In therapy

## 2016-02-06 NOTE — Assessment & Plan Note (Signed)
NAS diet Losartan

## 2016-02-06 NOTE — Assessment & Plan Note (Signed)
No CP 

## 2016-02-06 NOTE — Progress Notes (Signed)
Pre visit review using our clinic review tool, if applicable. No additional management support is needed unless otherwise documented below in the visit note. 

## 2016-02-06 NOTE — Assessment & Plan Note (Signed)
F/u w/Dr Lucia Gaskins - thoracic spine inj Not on steroids

## 2016-02-07 ENCOUNTER — Ambulatory Visit (HOSPITAL_BASED_OUTPATIENT_CLINIC_OR_DEPARTMENT_OTHER): Payer: Medicare Other

## 2016-02-07 ENCOUNTER — Other Ambulatory Visit: Payer: Medicare Other

## 2016-02-07 VITALS — BP 144/65 | HR 84 | Temp 98.1°F | Resp 18

## 2016-02-07 DIAGNOSIS — C679 Malignant neoplasm of bladder, unspecified: Secondary | ICD-10-CM

## 2016-02-07 DIAGNOSIS — Z5111 Encounter for antineoplastic chemotherapy: Secondary | ICD-10-CM

## 2016-02-07 MED ORDER — PROCHLORPERAZINE MALEATE 10 MG PO TABS
ORAL_TABLET | ORAL | Status: AC
  Filled 2016-02-07: qty 1

## 2016-02-07 MED ORDER — MITOMYCIN CHEMO FOR BLADDER INSTILLATION 40 MG
40.0000 mg | Freq: Once | INTRAVENOUS | Status: AC
  Administered 2016-02-07: 40 mg via INTRAVESICAL
  Filled 2016-02-07: qty 40

## 2016-02-07 MED ORDER — PROCHLORPERAZINE MALEATE 10 MG PO TABS
10.0000 mg | ORAL_TABLET | Freq: Once | ORAL | Status: AC
  Administered 2016-02-07: 10 mg via ORAL

## 2016-02-07 NOTE — Progress Notes (Signed)
Patient received intervesicular mitomycin. She retained the instillation of chemotherapy in her bladder for 2 hours and tolerated the procedure without any complications or complaints. About 500 ml of lavender urine removed via drainage into foley bag.

## 2016-02-14 ENCOUNTER — Ambulatory Visit (HOSPITAL_BASED_OUTPATIENT_CLINIC_OR_DEPARTMENT_OTHER): Payer: Medicare Other | Admitting: Oncology

## 2016-02-14 ENCOUNTER — Other Ambulatory Visit: Payer: Medicare Other

## 2016-02-14 ENCOUNTER — Ambulatory Visit (HOSPITAL_BASED_OUTPATIENT_CLINIC_OR_DEPARTMENT_OTHER): Payer: Medicare Other

## 2016-02-14 VITALS — BP 114/87 | HR 65 | Temp 98.0°F | Resp 17 | Ht 63.0 in | Wt 92.9 lb

## 2016-02-14 DIAGNOSIS — C679 Malignant neoplasm of bladder, unspecified: Secondary | ICD-10-CM | POA: Diagnosis not present

## 2016-02-14 DIAGNOSIS — Z5111 Encounter for antineoplastic chemotherapy: Secondary | ICD-10-CM

## 2016-02-14 MED ORDER — PROCHLORPERAZINE MALEATE 10 MG PO TABS
10.0000 mg | ORAL_TABLET | Freq: Once | ORAL | Status: AC
  Administered 2016-02-14: 10 mg via ORAL

## 2016-02-14 MED ORDER — PROCHLORPERAZINE MALEATE 10 MG PO TABS
ORAL_TABLET | ORAL | Status: AC
  Filled 2016-02-14: qty 1

## 2016-02-14 MED ORDER — MITOMYCIN CHEMO FOR BLADDER INSTILLATION 40 MG
40.0000 mg | Freq: Once | INTRAVENOUS | Status: AC
  Administered 2016-02-14: 40 mg via INTRAVESICAL
  Filled 2016-02-14: qty 40

## 2016-02-14 NOTE — Patient Instructions (Signed)
[facility] Discharge Instructions for Patients Receiving Chemotherapy  Today you received the following chemotherapy agents Mitomycin  To help prevent nausea and vomiting after your treatment, we encourage you to take your nausea medication as directed.    If you develop nausea and vomiting that is not controlled by your nausea medication, call the clinic.   BELOW ARE SYMPTOMS THAT SHOULD BE REPORTED IMMEDIATELY:  *FEVER GREATER THAN 100.5 F  *CHILLS WITH OR WITHOUT FEVER  NAUSEA AND VOMITING THAT IS NOT CONTROLLED WITH YOUR NAUSEA MEDICATION  *UNUSUAL SHORTNESS OF BREATH  *UNUSUAL BRUISING OR BLEEDING  TENDERNESS IN MOUTH AND THROAT WITH OR WITHOUT PRESENCE OF ULCERS  *URINARY PROBLEMS  *BOWEL PROBLEMS  UNUSUAL RASH Items with * indicate a potential emergency and should be followed up as soon as possible.  Feel free to call the clinic you have any questions or concerns. The clinic phone number is [phone].  Please show the [place] at check-in to the Emergency Department and triage nurse.

## 2016-02-14 NOTE — Progress Notes (Signed)
Hematology and Oncology Follow Up Visit  DEVONDRA NAKANO [id number] [birth year] 82 y.o. 02/14/2016 8:48 AM Walker Kehr, MDPlotnikov, Evie Lacks, MD   Principle Diagnosis: 82 year old with recurrent superficial bladder tumor dating back to 2013. She had recurrent disease in September 2017.   Prior Therapy: Multiple TURBT and BCG treatments and have progressed despite that.   Current therapy: Intravesical mitomycin-C weekly for a total of 6 weeks today is week 6 of therapy.  Interim History: Mrs. Bourdon presents today for a follow-up visit. Since the last visit, she has been receiving intravesical mitomycin-C on a weekly basis and have tolerated it well. She does report occasional abdominal distention and irritation but resolves spontaneously. She denied any dysuria, hematuria or change in her bowel habits. Her respiratory status remained tenuous at times but unchanged. She does report occasional dyspnea and wheezing but does not require oxygen. Her by mouth intake has not dramatically changed but her weight is down a few pounds since last visit.  She does not report any headaches, blurry vision, syncope or seizures. She does not report any fevers or chills or sweats. She does not report any chest pain, palpitation, orthopnea or leg edema. She does not report any nausea, vomiting or abdominal pain. She does not report any frequency urgency or hesitancy. She does not report any skeletal complaints. Remaining review of systems unremarkable.  Medications: I have reviewed the patient's current medications.  Current Outpatient Prescriptions  Medication Sig Dispense Refill  . albuterol (PROAIR HFA) 108 (90 BASE) MCG/ACT inhaler Inhale 1-2 puffs into the lungs every 4 (four) hours as needed for wheezing or shortness of breath. 1 Inhaler 10  . ALPRAZolam (XANAX) 0.25 MG tablet TAKE 1/2 TO 1 TABLET BY MOUTH TWICE A DAY AS NEEDED FOR ANXIETY (SHORTNESS OF BREATH) 60 tablet 2  . baclofen (LIORESAL) 10 MG  tablet Take 10 mg by mouth 3 (three) times daily as needed for muscle spasms.     Marland Kitchen BIOTIN PO Take 1 capsule by mouth daily.    . Cholecalciferol (VITAMIN D) 1000 UNITS capsule Take 1,000 Units by mouth daily.     . famotidine (PEPCID) 20 MG tablet Take 20 mg by mouth 2 (two) times daily.    Marland Kitchen ibuprofen (ADVIL,MOTRIN) 200 MG tablet Take 600-800 mg by mouth every 6 (six) hours as needed for moderate pain.    Marland Kitchen lidocaine (LIDODERM) 5 % Place 1 patch onto the skin daily as needed (NECK PAIN). Remove & Discard patch within 12 hours or as directed by MD    . losartan-hydrochlorothiazide (HYZAAR) 50-12.5 MG tablet Take 1 tablet by mouth daily. 90 tablet 3  . traZODone (DESYREL) 50 MG tablet TAKE 1 TABLET BY MOUTH EVERY DAY (Patient taking differently: TAKE 1 TABLET BY MOUTH EVERY DAY AT BEDTIME) 90 tablet 2  . zoledronic acid (RECLAST) 5 MG/100ML SOLN Inject 5 mg into the vein once. Every year    . budesonide-formoterol (SYMBICORT) 160-4.5 MCG/ACT inhaler Inhale 2 puffs into the lungs 2 (two) times daily. 1 Inhaler 0   No current facility-administered medications for this visit.      Allergies:  Allergies  Allergen Reactions  . Barbiturates Rash  . Penicillins Rash    TOLERATES CEPHALOSPORINS Has patient had a PCN reaction causing immediate rash, facial/tongue/throat swelling, SOB or lightheadedness with hypotension: Yes  Has patient had a PCN reaction causing severe rash involving mucus membranes or skin necrosis: Yes. Itchy Has patient had a PCN reaction that required hospitalization No Has patient  had a PCN reaction occurring within the last 10 years: No If all of the above answers are "NO", then may proceed with Cephalosporin use.   . Shrimp [Shellfish Allergy] Swelling  . Anoro Ellipta [Umeclidinium-Vilanterol]     cough  . Aspirin Nausea Only  . Beta Adrenergic Blockers     PT STATES TOLD TO AVOID DUE TO COUGH REACTION TO CARDIZEM  . Bisoprolol     SOB and dizzy  . Calcium Channel  Blockers     PER PT TOLD TO AVOID DUE TO COUGH REACTION TO CARDIZEM  . Celecoxib Nausea Only  . Ciprofloxacin Other (See Comments)    Red streaks on arm when given IV  . Codeine Other (See Comments)    REACTION: excessive sleep  . Diltiazem Hcl Other (See Comments)    REACTION: cough  . Fluvastatin Sodium     Hair fell out, finger nails fell off   . Ibandronate Sodium Other (See Comments)    REACTION: HAIR LOSS/NAIL CHANGES  . Oxycodone-Acetaminophen Nausea Only  . Rosuvastatin     Hair fell out, finger nails fell off   . Simvastatin     Hair fell out, finger nails fell off   . Tramadol Nausea And Vomiting    Past Medical History, Surgical history, Social history, and Family History were reviewed and updated.   Physical Exam: Blood pressure 114/87, pulse 65, temperature 98 F (36.7 C), temperature source Oral, resp. rate 17, height 5\' 3"  (1.6 m), weight 92 lb 14.4 oz (42.1 kg), SpO2 96 %. ECOG: 1 General appearance: alert and cooperative Head: Normocephalic, without obvious abnormality Neck: no adenopathy Lymph nodes: Cervical, supraclavicular, and axillary nodes normal. Heart:regular rate and rhythm, S1, S2 normal, no murmur, click, rub or gallop Lung:chest clear, no wheezing, rales, normal symmetric air entry, Heart exam - S1, S2 normal, no murmur, no gallop, rate regular Abdomin: soft, non-tender, without masses or organomegaly EXT:no erythema, induration, or nodules   Lab Results: Lab Results  Component Value Date   WBC 7.8 01/11/2016   HGB 13.6 01/11/2016   HCT 40.2 01/11/2016   MCV 91.8 01/11/2016   PLT 266 01/11/2016     Chemistry      Component Value Date/Time   NA 140 01/11/2016 1002   K 3.9 01/11/2016 1002   CL 108 11/23/2015 1120   CO2 26 01/11/2016 1002   BUN 16.5 01/11/2016 1002   CREATININE 0.9 01/11/2016 1002      Component Value Date/Time   CALCIUM 9.9 01/11/2016 1002   ALKPHOS 76 01/11/2016 1002   AST 22 01/11/2016 1002   ALT 15 01/11/2016  1002   BILITOT 0.54 01/11/2016 1002       Impression and Plan:  82 year old woman with the following issues:  1. Superficial bladder tumor that has been diagnosed dating back to 2013. 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 She is status post multiple TURBT and BCG treatment.  Cystoscopy on 11/27/2015 showed high-grade urothelial carcinoma without invasion indicating high-grade TA multifocal disease. She had imaging studies in 2016 without evidence to suggest metastatic disease.  She is currently receiving mitomycin-C intravesically and has tolerated it well. She will complete her therapy today and she has a follow-up cystoscopy in one month with Dr. Junious Silk. The plan is to proceed with today's treatment without any delay.  2. Follow-up: I'm happy to see her in the future as needed.   [facility], MD 10/19/20178:48 AM

## 2016-02-14 NOTE — Progress Notes (Signed)
Patient received intervesicular mitomycin. She retained the instillation of chemotherapy in her bladder for 1.75 hours and tolerated the procedure without any complications or complaints. About 300 ml of lavender urine removed via drainage into foley bag.

## 2016-02-20 DIAGNOSIS — B078 Other viral warts: Secondary | ICD-10-CM | POA: Diagnosis not present

## 2016-02-20 DIAGNOSIS — L821 Other seborrheic keratosis: Secondary | ICD-10-CM | POA: Diagnosis not present

## 2016-02-20 DIAGNOSIS — D224 Melanocytic nevi of scalp and neck: Secondary | ICD-10-CM | POA: Diagnosis not present

## 2016-02-20 DIAGNOSIS — L565 Disseminated superficial actinic porokeratosis (DSAP): Secondary | ICD-10-CM | POA: Diagnosis not present

## 2016-02-20 DIAGNOSIS — D692 Other nonthrombocytopenic purpura: Secondary | ICD-10-CM | POA: Diagnosis not present

## 2016-02-22 ENCOUNTER — Other Ambulatory Visit: Payer: Self-pay | Admitting: Internal Medicine

## 2016-02-26 ENCOUNTER — Other Ambulatory Visit: Payer: Self-pay | Admitting: Obstetrics

## 2016-02-26 DIAGNOSIS — M8580 Other specified disorders of bone density and structure, unspecified site: Secondary | ICD-10-CM

## 2016-03-14 DIAGNOSIS — R911 Solitary pulmonary nodule: Secondary | ICD-10-CM | POA: Diagnosis not present

## 2016-03-14 DIAGNOSIS — C679 Malignant neoplasm of bladder, unspecified: Secondary | ICD-10-CM | POA: Diagnosis not present

## 2016-03-14 DIAGNOSIS — R918 Other nonspecific abnormal finding of lung field: Secondary | ICD-10-CM | POA: Diagnosis not present

## 2016-03-14 DIAGNOSIS — C672 Malignant neoplasm of lateral wall of bladder: Secondary | ICD-10-CM | POA: Diagnosis not present

## 2016-03-17 ENCOUNTER — Telehealth (INDEPENDENT_AMBULATORY_CARE_PROVIDER_SITE_OTHER): Payer: Self-pay | Admitting: Physical Medicine and Rehabilitation

## 2016-03-17 NOTE — Telephone Encounter (Signed)
Ok repeat C7-T1 interlam then

## 2016-03-17 NOTE — Telephone Encounter (Signed)
On 10/18/2015 I did review with her her low back and hip pain. She did have a small listhesis on x-ray but no MRI. If her biggest problem is her low back and hip pain is in that note on SRS than I would look at a one-time epidural injection. Otherwise if her neck pain is actually worse we did complete an injection back in October which was an epidural injection. If it's not do any better from that then we could look at trigger point injections which have helped her in the past. In other words pick whichever areas hurting her worse to we can go from there.

## 2016-03-18 NOTE — Telephone Encounter (Signed)
I called the patient and left a message for her to call back for scheduling.

## 2016-03-18 NOTE — Telephone Encounter (Signed)
[employer] active and no precert required

## 2016-03-19 DIAGNOSIS — C672 Malignant neoplasm of lateral wall of bladder: Secondary | ICD-10-CM | POA: Diagnosis not present

## 2016-03-25 ENCOUNTER — Other Ambulatory Visit: Payer: Self-pay | Admitting: Urology

## 2016-03-31 ENCOUNTER — Ambulatory Visit (INDEPENDENT_AMBULATORY_CARE_PROVIDER_SITE_OTHER): Payer: Medicare Other | Admitting: Physical Medicine and Rehabilitation

## 2016-03-31 ENCOUNTER — Encounter (INDEPENDENT_AMBULATORY_CARE_PROVIDER_SITE_OTHER): Payer: Self-pay | Admitting: Physical Medicine and Rehabilitation

## 2016-03-31 VITALS — BP 106/54 | HR 69

## 2016-03-31 DIAGNOSIS — M5010 Cervical disc disorder with radiculopathy, unspecified cervical region: Secondary | ICD-10-CM

## 2016-03-31 DIAGNOSIS — M5412 Radiculopathy, cervical region: Secondary | ICD-10-CM | POA: Diagnosis not present

## 2016-03-31 DIAGNOSIS — Z1231 Encounter for screening mammogram for malignant neoplasm of breast: Secondary | ICD-10-CM | POA: Diagnosis not present

## 2016-03-31 DIAGNOSIS — M8580 Other specified disorders of bone density and structure, unspecified site: Secondary | ICD-10-CM | POA: Diagnosis not present

## 2016-03-31 MED ORDER — LIDOCAINE HCL (PF) 1 % IJ SOLN
0.3300 mL | Freq: Once | INTRAMUSCULAR | Status: AC
  Administered 2016-03-31: 0.3 mL

## 2016-03-31 MED ORDER — METHYLPREDNISOLONE ACETATE 80 MG/ML IJ SUSP
80.0000 mg | Freq: Once | INTRAMUSCULAR | Status: AC
  Administered 2016-03-31: 80 mg

## 2016-03-31 NOTE — Progress Notes (Signed)
SKAI HARTLINE - 82 y.o. female MRN [id number]  Date of birth: [birth year]  Office Visit Note: Visit Date: 03/31/2016 PCP: Walker Kehr, MD Referred by: Cassandria Anger, MD  Subjective: Chief Complaint  Patient presents with  . Neck - Pain   HPI: Mrs. Amber Snow is an 81 year old female that is well known to our service with chronic neck pain which is a combination of myofascial pain and cervical spondylosis and then the latest was a disc extrusion at T1-2 that was seen on MRI from May of this year. She went on to have 2 epidural injections with the last being in October that did help her. She's had increasing symptoms just recently. Spend about 2 months since the last injection. Increased right sided neck pain in the last couple of weeks. Radiating into right shoulder. Denies arm pain. Constant pain. Relief with laying and sitting with arm propped up on pillow. She does have a history of malignant bladder cancer and is undergoing a outpatient procedure for tumor removal on Friday. She has had this before and we have done epidural injection around it should be okay. There is not a issue of open wound care we are using a small amount of steroid medication. She has just completed a course of chemotherapy.     ROS Otherwise per HPI.  Assessment & Plan: Visit Diagnoses:  1. Cervical disc disorder with radiculopathy   2. Cervical radiculopathy     Plan: Findings:  Right C7-T1 intralaminar epidural steroid injection for T1-2 herniated disc extrusion with chronic worsening severe neck and shoulder blade pain.    Meds & Orders:  Meds ordered this encounter  Medications  . lidocaine (PF) (XYLOCAINE) 1 % injection 0.3 mL  . methylPREDNISolone acetate (DEPO-MEDROL) injection 80 mg    Orders Placed This Encounter  Procedures  . Epidural Steroid injection    Follow-up: Return if symptoms worsen or fail to improve.   Procedures: No procedures performed  Cervical Epidural Steroid  Injection - Interlaminar Approach with Fluoroscopic Guidance  Patient: Amber Snow      Date of Birth: February 24, 1933 MRN: [id number] PCP: Walker Kehr, MD      Visit Date: 03/31/2016   Universal Protocol:    Date/Time: 12/04/175:07 PM  Consent Given By: the patient  Position: PRONE  Additional Comments: Vital signs were monitored before and after the procedure. Patient was prepped and draped in the usual sterile fashion. The correct patient, procedure, and site was verified.   Injection Procedure Details:  Procedure Site One Meds Administered:  Meds ordered this encounter  Medications  . lidocaine (PF) (XYLOCAINE) 1 % injection 0.3 mL  . methylPREDNISolone acetate (DEPO-MEDROL) injection 80 mg     Laterality: Right  Location/Site:  C7-T1  Needle size: 20 G  Needle type: Touhy  Needle Placement: Paramedian epidural space  Findings:  -Contrast Used: 1 mL iohexol 180 mg iodine/mL   -Comments: Excellent flow of contrast into the epidural space.  Procedure Details: Using a paramedian approach from the side mentioned above, the region overlying the inferior lamina was localized under fluoroscopic visualization and the soft tissues overlying this structure were infiltrated with 4 ml. of 1% Lidocaine without Epinephrine. A # 20 gauge, Tuohy needle was inserted into the epidural space using a paramedian approach.  The epidural space was localized using loss of resistance along with lateral and contralateral oblique bi-planar fluoroscopic views.  After negative aspirate for air, blood, and CSF, a 2 ml. volume of Isovue-250 was injected  into the epidural space and the flow of contrast was observed. Radiographs were obtained for documentation purposes.   The injectate was administered into the level noted above.  Additional Comments:  The patient tolerated the procedure well Dressing: Band-Aid    Post-procedure details: Patient was observed during the  procedure. Post-procedure instructions were reviewed.  Patient left the clinic in stable condition.       Clinical History: No specialty comments available.  She reports that she quit smoking about 4 years ago. Her smoking use included Cigarettes. She has a 50.00 pack-year smoking history. She has never used smokeless tobacco. No results for input(s): HGBA1C, LABURIC in the last 8760 hours.  Objective:  VS:  HT:    WT:   BMI:     BP:(!) 106/54  HR:69bpm  TEMP: ( )  RESP:95 % Physical Exam  Musculoskeletal:  Cervical range of motion is limited with rotation right more than left. She does have some focal trigger points in the levator scapula and rhomboids. She has no shoulder impingement signs. She has good upper body strength side to side although she is weak overall.    Ortho Exam Imaging: No results found.  Past Medical/Family/Surgical/Social History: Medications & Allergies reviewed per EMR Patient Active Problem List   Diagnosis Date Noted  . Cervical disc disorder with radiculopathy 12/15/2015  . Cervical spondylosis with myelopathy 12/15/2015  . COPD exacerbation ([place]) 11/10/2015  . Essential hypertension 11/10/2015  . Oral candidiasis 08/09/2015  . Weakness 02/21/2015  . Edema 11/08/2014  . Acute bronchitis 07/17/2014  . Cellulitis of leg, left 12/28/2013  . Laceration of leg 12/15/2013  . CAD (coronary artery disease) 09/02/2013  . Well adult exam 07/06/2013  . Dizzy 05/10/2013  . Chronic systolic CHF (congestive heart failure) ([place]) 12/30/2012  . Sepsis ([place]) 09/16/2012  . CAP (community acquired pneumonia) 09/16/2012  . Nonischemic cardiomyopathy ([place]) 06/02/2012  . Lung nodule 06/01/2012  . Bladder cancer ([place]) 12/17/2011  . LV dysfunction 09/26/2011  . Dyspnea 08/13/2011  . PAD (peripheral artery disease) ([place]) 08/13/2011  . Murmur 08/13/2011  . Other dysphagia 09/09/2010  . LOW BACK PAIN, CHRONIC 10/17/2009  . Low back pain radiating to both legs  10/17/2009  . THYROID FUNCTION TEST, ABNORMAL 07/19/2009  . LEUKOCYTOSIS 07/02/2009  . Nonspecific (abnormal) findings on radiological and other examination of body structure 07/02/2009  . Fatigue 06/29/2009  . Vitamin D deficiency 11/20/2008  . ARTHRALGIA 11/20/2008  . UNSPECIFIED MYALGIA AND MYOSITIS 11/20/2008  . OTH SYMPTOMS INVLV NERV&MUSCULOSKELETAL SYSTEMS 03/15/2008  . LBBB (left bundle branch block) 02/14/2008  . OSTEOPENIA 11/12/2007  . RENAL CALCULUS 08/16/2007  . DIVERTICULOSIS, COLON 12/25/2006  . HYPERLIPIDEMIA 10/02/2006  . COPD GOLD II  10/02/2006   Past Medical History:  Diagnosis Date  . Acute exacerbation of chronic obstructive pulmonary disease (COPD) (HCC)    ON PREDNISONE INCREASED DOSE  FOR LAST WEEK  . Arthritis HANDS  . Bladder calculus   . Bladder cancer ([place])   . CAD (coronary artery disease) CARDIOLOGSIT-- DR Martinique   05-17-2013minimal nonobstructive CAD per cath with EF of 35 to 40% and normal filling pressures and right heart pressures-Dr. Martinique follows  . Chronic systolic CHF (congestive heart failure) ([place])   . COPD, moderate ([place]) PULMOLOGIST-- DR Melvyn Novas   W/ EMPHYSEMA  (COPD  GOLD II)- Dr. Melvyn Novas follows  . Diverticulosis of colon   . Dyspnea on exertion   . History of nephrolithiasis   . Hyperlipidemia   . LBBB (left bundle  branch block)    CHRONIC  . Myalgia   . Neck pain    07-30-15 at present and flares periodically"? stenosis"  . Nocturia   . Nodule of right lung    APICAL 1.0CM (MONITORED BY DR Melvyn Novas)  . Nonischemic cardiomyopathy ([place])   . Osteopenia   . PAC (premature atrial contraction)   . Pneumonia 1-2 YEARS AGO  . Renal calculus, bilateral   . Wears hearing aid    BILATERAL   Family History  Problem Relation Age of Onset  . Heart disease Mother   . Hypertension Mother   . Hypertension Father   . Heart attack Father   . Heart disease Father   . Thyroid disease Sister   . Heart attack Sister   . Stroke Sister   . COPD  Sister   . Heart attack Brother   . Cancer Maternal Grandmother     breast cancer  . Diabetes Paternal Grandmother   . Asthma Neg Hx    Past Surgical History:  Procedure Laterality Date  . CARDIAC CATHETERIZATION  09-12-2011  DR Martinique   MINIMAL NONOBSTRUCTIVE CAD/ MODERATE TO SEVERE LV DYSFUNCTION/  NORMAL RIGHT HEART PRESSURES AND LV FILLING PRESSURES  . CATARACT EXTRACTION W/ INTRAOCULAR LENS  IMPLANT, BILATERAL  1998  . COSMETIC SURGERY     eyelids  . CYSTO/ LEFT RETROGRADE PYELOGRAM/ LEFT URETERAL STENT PLACEMENT  10-16-2001   STONE  . CYSTOSCOPY  07/01/2011   Procedure: CYSTOSCOPY;  Surgeon: Hanley Ben, MD;  Location[facility];  Service: Urology;  Laterality: N/A;  retrieval l of bladder stone  . CYSTOSCOPY N/A 08/20/2012   Procedure: CYSTOSCOPY;  Surgeon: Hanley Ben, MD;  Location[facility];  Service: Urology;  Laterality: N/A;  fulgeration of bladderm tumors  . CYSTOSCOPY N/A 06/23/2014   Procedure: CYSTOSCOPY;  Surgeon: Arvil Persons, MD;  Location[facility];  Service: Urology;  Laterality: N/A;  . CYSTOSCOPY W/ RETROGRADES Right 04/01/2013   Procedure: CYSTOSCOPY WITH RETROGRADE PYELOGRAM;  Surgeon: Hanley Ben, MD;  Location: [place];  Service: Urology;  Laterality: Right;  . CYSTOSCOPY WITH BIOPSY N/A 02/06/2015   Procedure: CYSTO WITH BLADDER BIOPSY;  Surgeon: Festus Aloe, MD;  Location: [facility];  Service: Urology;  Laterality: N/A;  . CYSTOSCOPY WITH FULGERATION N/A 11/27/2015   Procedure: CYSTOSCOPY WITH FULGERATION BLADDER BIOPSY ;  Surgeon: Festus Aloe, MD;  Location: [facility];  Service: Urology;  Laterality: N/A;  . CYSTOSCOPY WITH LITHOLAPAXY N/A 04/01/2013   Procedure: CYSTOSCOPY WITH HOLMIUM LASER OF  BLADDER CALCULUS;  Surgeon: Hanley Ben, MD;  Location: [place];  Service: Urology;  Laterality: N/A;  . CYSTOSCOPY WITH RETROGRADE PYELOGRAM, URETEROSCOPY AND  STENT PLACEMENT Bilateral 07/31/2015   Procedure: CYSTOSCOPY WITH FULGERATION BLADDER BIOPSY, CYSTOSCOPY WITH BILATERAL RETROGRADE PYELOGRAMS;  Surgeon: Festus Aloe, MD;  Location: [facility];  Service: Urology;  Laterality: Bilateral;  . EXTRACORPOREAL SHOCK WAVE LITHOTRIPSY  2003  . FULGURATION OF BLADDER TUMOR  06/23/2014   Procedure: FULGURATION OF BLADDER TUMOR;  Surgeon: Arvil Persons, MD;  Location: [facility];  Service: Urology;;  . FULGURATION OF BLADDER TUMOR N/A 02/06/2015   Procedure: FULGURATION OF BLADDER TUMORS;  Surgeon: Festus Aloe, MD;  Location: [facility];  Service: Urology;  Laterality: N/A;  . HOLMIUM LASER APPLICATION N/A [id number]   Procedure: HOLMIUM LASER APPLICATION;  Surgeon: Hanley Ben, MD;  Location: [place];  Service: Urology;  Laterality: N/A;  .  LEFT AND RIGHT HEART CATHETERIZATION WITH CORONARY ANGIOGRAM Bilateral 09/12/2011   Procedure: LEFT AND RIGHT HEART CATHETERIZATION WITH CORONARY ANGIOGRAM;  Surgeon: Peter M Martinique, MD;  Location: [facility];  Service: Cardiovascular;  Laterality: Bilateral;  . LEFT LONG FINGER ARTHROPLASTY & PULLEY RELASE  02-17-2005  . ORIF FINGER / THUMB FRACTURE    . PULMONARY FUNCTION ANALYSIS  07-31-2009   MODERATE TO SEVERE OBSTRUCTIVE AIRWAY DISEASE/ INSIGNIFICANT RESPONSE TO BRONCHODILATORS/ EMPHYSEMA PATTERN/ MILD DECREASE IN DIFFUSE CAPACITY FEF 25-75 CHANGED BY 8%  . TONSILLECTOMY  CHILD  . TOOTH EXTRACTION  01-17-15   LOWER TOOTH CROWN  BROKE OFF AND WAS EXTRACTED  . TRANSTHORACIC ECHOCARDIOGRAM  last one 03-15-2014  DR Martinique   GRADE I DIASTOLIC DYSFUNCTION/  confirmed by dr Liane Comber  EF 45% (down from 60-65% 03-12-2013 but up from 25-30% compared to last echo 10-01-2012 /  NORMAL LV SIZE AND WALL THICKNESS/  trivial TR  . TRANSURETHRAL RESECTION OF BLADDER TUMOR  07/01/2011   Procedure: TRANSURETHRAL RESECTION OF BLADDER TUMOR (TURBT);  Surgeon: Hanley Ben, MD;  Location: [facility];  Service: Urology;  Laterality: N/A;  CYSTO  . TRANSURETHRAL RESECTION OF BLADDER TUMOR N/A 02/06/2015   Procedure:  TRANSURETHRAL RESECTION OF BLADDER TUMOR (TURBT) GREATER THAN 5 CM; RIGHT RETROGRADE, RIGHT STENT PLACEMENT;  Surgeon: Festus Aloe, MD;  Location: [facility];  Service: Urology;  Laterality: N/A;  . TRANSURETHRAL RESECTION OF BLADDER TUMOR N/A 07/31/2015   Procedure:  TRANSURETHRAL RESECTION OF BLADDER TUMOR (TURBT); REMOVAL OF DYSTROPHIC CALCIFICATIONS;  Surgeon: Festus Aloe, MD;  Location: [facility];  Service: Urology;  Laterality: N/A;  . TRANSURETHRAL RESECTION OF BLADDER TUMOR N/A 11/27/2015   Procedure: TRANSURETHRAL RESECTION OF BLADDER TUMOR (TURBT);  Surgeon: Festus Aloe, MD;  Location: [facility];  Service: Urology;  Laterality: N/A;  . TUBAL LIGATION  AGE 54'S   Social History   Occupational History  . Retired     Government social research officer Asst   Social History Main Topics  . Smoking status: Former Smoker    Packs/day: 1.00    Years: 50.00    Types: Cigarettes    Quit date: 09/27/2011  . Smokeless tobacco: Never Used  . Alcohol use Yes     Comment: winw rarely  . Drug use: No  . Sexual activity: Not on file

## 2016-03-31 NOTE — Patient Instructions (Signed)

## 2016-03-31 NOTE — Procedures (Signed)
Cervical Epidural Steroid Injection - Interlaminar Approach with Fluoroscopic Guidance  Patient: Amber Snow      Date of Birth: [birth year] MRN: [id number] PCP: Walker Kehr, MD      Visit Date: 03/31/2016   Universal Protocol:    Date/Time: 12/04/175:07 PM  Consent Given By: the patient  Position: PRONE  Additional Comments: Vital signs were monitored before and after the procedure. Patient was prepped and draped in the usual sterile fashion. The correct patient, procedure, and site was verified.   Injection Procedure Details:  Procedure Site One Meds Administered:  Meds ordered this encounter  Medications  . lidocaine (PF) (XYLOCAINE) 1 % injection 0.3 mL  . methylPREDNISolone acetate (DEPO-MEDROL) injection 80 mg     Laterality: Right  Location/Site:  C7-T1  Needle size: 20 G  Needle type: Touhy  Needle Placement: Paramedian epidural space  Findings:  -Contrast Used: 1 mL iohexol 180 mg iodine/mL   -Comments: Excellent flow of contrast into the epidural space.  Procedure Details: Using a paramedian approach from the side mentioned above, the region overlying the inferior lamina was localized under fluoroscopic visualization and the soft tissues overlying this structure were infiltrated with 4 ml. of 1% Lidocaine without Epinephrine. A # 20 gauge, Tuohy needle was inserted into the epidural space using a paramedian approach.  The epidural space was localized using loss of resistance along with lateral and contralateral oblique bi-planar fluoroscopic views.  After negative aspirate for air, blood, and CSF, a 2 ml. volume of Isovue-250 was injected into the epidural space and the flow of contrast was observed. Radiographs were obtained for documentation purposes.   The injectate was administered into the level noted above.  Additional Comments:  The patient tolerated the procedure well Dressing: Band-Aid    Post-procedure details: Patient was observed  during the procedure. Post-procedure instructions were reviewed.  Patient left the clinic in stable condition.

## 2016-04-01 ENCOUNTER — Encounter (HOSPITAL_COMMUNITY)
Admission: RE | Admit: 2016-04-01 | Discharge: 2016-04-01 | Disposition: A | Discharge status: Home / Self Care | Payer: Medicare Other | Source: Ambulatory Visit | Attending: Urology | Admitting: Urology

## 2016-04-01 ENCOUNTER — Other Ambulatory Visit: Payer: Medicare Other

## 2016-04-01 ENCOUNTER — Encounter (HOSPITAL_COMMUNITY): Payer: Self-pay

## 2016-04-01 DIAGNOSIS — Z881 Allergy status to other antibiotic agents status: Secondary | ICD-10-CM | POA: Diagnosis not present

## 2016-04-01 DIAGNOSIS — J189 Pneumonia, unspecified organism: Secondary | ICD-10-CM

## 2016-04-01 DIAGNOSIS — C678 Malignant neoplasm of overlapping sites of bladder: Secondary | ICD-10-CM | POA: Diagnosis not present

## 2016-04-01 DIAGNOSIS — C679 Malignant neoplasm of bladder, unspecified: Secondary | ICD-10-CM

## 2016-04-01 DIAGNOSIS — Z01812 Encounter for preprocedural laboratory examination: Secondary | ICD-10-CM | POA: Insufficient documentation

## 2016-04-01 DIAGNOSIS — I428 Other cardiomyopathies: Secondary | ICD-10-CM

## 2016-04-01 DIAGNOSIS — I2510 Atherosclerotic heart disease of native coronary artery without angina pectoris: Secondary | ICD-10-CM | POA: Diagnosis not present

## 2016-04-01 DIAGNOSIS — E785 Hyperlipidemia, unspecified: Secondary | ICD-10-CM | POA: Insufficient documentation

## 2016-04-01 DIAGNOSIS — M899 Disorder of bone, unspecified: Secondary | ICD-10-CM

## 2016-04-01 DIAGNOSIS — E559 Vitamin D deficiency, unspecified: Secondary | ICD-10-CM

## 2016-04-01 DIAGNOSIS — R911 Solitary pulmonary nodule: Secondary | ICD-10-CM

## 2016-04-01 DIAGNOSIS — A419 Sepsis, unspecified organism: Secondary | ICD-10-CM

## 2016-04-01 DIAGNOSIS — R1319 Other dysphagia: Secondary | ICD-10-CM

## 2016-04-01 DIAGNOSIS — M545 Low back pain: Secondary | ICD-10-CM

## 2016-04-01 DIAGNOSIS — R011 Cardiac murmur, unspecified: Secondary | ICD-10-CM | POA: Insufficient documentation

## 2016-04-01 DIAGNOSIS — J449 Chronic obstructive pulmonary disease, unspecified: Secondary | ICD-10-CM

## 2016-04-01 DIAGNOSIS — I519 Heart disease, unspecified: Secondary | ICD-10-CM

## 2016-04-01 DIAGNOSIS — I739 Peripheral vascular disease, unspecified: Secondary | ICD-10-CM | POA: Insufficient documentation

## 2016-04-01 DIAGNOSIS — D72829 Elevated white blood cell count, unspecified: Secondary | ICD-10-CM

## 2016-04-01 DIAGNOSIS — Z888 Allergy status to other drugs, medicaments and biological substances status: Secondary | ICD-10-CM | POA: Diagnosis not present

## 2016-04-01 DIAGNOSIS — I110 Hypertensive heart disease with heart failure: Secondary | ICD-10-CM | POA: Diagnosis not present

## 2016-04-01 DIAGNOSIS — Z886 Allergy status to analgesic agent status: Secondary | ICD-10-CM | POA: Diagnosis not present

## 2016-04-01 DIAGNOSIS — I509 Heart failure, unspecified: Secondary | ICD-10-CM | POA: Diagnosis not present

## 2016-04-01 DIAGNOSIS — Z720 Tobacco use: Secondary | ICD-10-CM | POA: Diagnosis not present

## 2016-04-01 DIAGNOSIS — I5022 Chronic systolic (congestive) heart failure: Secondary | ICD-10-CM

## 2016-04-01 DIAGNOSIS — Z91013 Allergy to seafood: Secondary | ICD-10-CM | POA: Diagnosis not present

## 2016-04-01 DIAGNOSIS — Z885 Allergy status to narcotic agent status: Secondary | ICD-10-CM | POA: Diagnosis not present

## 2016-04-01 DIAGNOSIS — Z880 Allergy status to penicillin: Secondary | ICD-10-CM | POA: Diagnosis not present

## 2016-04-01 HISTORY — DX: Unspecified hearing loss, bilateral: H91.93

## 2016-04-01 HISTORY — DX: Dyspnea, unspecified: R06.00

## 2016-04-01 LAB — CBC
HCT: 38.8 % (ref 36.0–46.0)
HEMOGLOBIN: 12.9 g/dL (ref 12.0–15.0)
MCH: 31.2 pg (ref 26.0–34.0)
MCHC: 33.2 g/dL (ref 30.0–36.0)
MCV: 93.9 fL (ref 78.0–100.0)
PLATELETS: 302 10*3/uL (ref 150–400)
RBC: 4.13 MIL/uL (ref 3.87–5.11)
RDW: 13.1 % (ref 11.5–15.5)
WBC: 13 10*3/uL — ABNORMAL HIGH (ref 4.0–10.5)

## 2016-04-01 LAB — BASIC METABOLIC PANEL
Anion gap: 10 (ref 5–15)
BUN: 30 mg/dL — AB (ref 6–20)
CALCIUM: 9.9 mg/dL (ref 8.9–10.3)
CHLORIDE: 101 mmol/L (ref 101–111)
CO2: 28 mmol/L (ref 22–32)
CREATININE: 0.95 mg/dL (ref 0.44–1.00)
GFR, EST NON AFRICAN AMERICAN: 55 mL/min — AB (ref >60)
Glucose, Bld: 118 mg/dL — ABNORMAL HIGH (ref 65–99)
Potassium: 4.3 mmol/L (ref 3.5–5.1)
SODIUM: 139 mmol/L (ref 135–145)

## 2016-04-01 NOTE — Patient Instructions (Addendum)
Amber Snow  04/01/2016   Your procedure is scheduled on: 04-04-16  Report to [facility] Main  Entrance take [facility]  elevators to 3rd floor to  [place] at 0930 AM.  Call this number if you have problems the morning of surgery [phone]   Remember: ONLY 1 PERSON MAY GO WITH YOU TO SHORT STAY TO GET  READY MORNING OF [place].  Do not eat food or drink liquids :After Midnight.     Take these medicines the morning of surgery with A SIP OF WATER: Famotidine. Inhalers-usual DO NOT TAKE ANY DIABETIC MEDICATIONS DAY OF YOUR SURGERY                               You may not have any metal on your body including hair pins and              piercings  Do not wear jewelry, make-up, lotions, powders or perfumes, deodorant             Do not wear nail polish.  Do not shave  48 hours prior to surgery.              Men may shave face and neck.   Do not bring valuables to the hospital. [place].  Contacts, dentures or bridgework may not be worn into surgery.  Leave suitcase in the car. After surgery it may be brought to your room.     Patients discharged the day of surgery will not be allowed to drive home.  Name and phone number of your driver: Sallee Lange -friend- will provide  Special Instructions: N/A              Please read over the following fact sheets you were given: _____________________________________________________________________             [facility] - Preparing for Surgery Before surgery, you can play an important role.  Because skin is not sterile, your skin needs to be as free of germs as possible.  You can reduce the number of germs on your skin by washing with CHG (chlorahexidine gluconate) soap before surgery.  CHG is an antiseptic cleaner which kills germs and bonds with the skin to continue killing germs even after washing. Please DO NOT use if you have an allergy to CHG or  antibacterial soaps.  If your skin becomes reddened/irritated stop using the CHG and inform your nurse when you arrive at Short Stay. Do not shave (including legs and underarms) for at least 48 hours prior to the first CHG shower.  You may shave your face/neck. Please follow these instructions carefully:  1.  Shower with CHG Soap the night before surgery and the  morning of Surgery.  2.  If you choose to wash your hair, wash your hair first as usual with your  normal  shampoo.  3.  After you shampoo, rinse your hair and body thoroughly to remove the  shampoo.                           4.  Use CHG as you would any other liquid soap.  You can apply chg directly  to  the skin and wash                       Gently with a scrungie or clean washcloth.  5.  Apply the CHG Soap to your body ONLY FROM THE NECK DOWN.   Do not use on face/ open                           Wound or open sores. Avoid contact with eyes, ears mouth and genitals (private parts).                       Wash face,  Genitals (private parts) with your normal soap.             6.  Wash thoroughly, paying special attention to the area where your surgery  will be performed.  7.  Thoroughly rinse your body with warm water from the neck down.  8.  DO NOT shower/wash with your normal soap after using and rinsing off  the CHG Soap.                9.  Pat yourself dry with a clean towel.            10.  Wear clean pajamas.            11.  Place clean sheets on your bed the night of your first shower and do not  sleep with pets. Day of Surgery : Do not apply any lotions/deodorants the morning of surgery.  Please wear clean clothes to the hospital/surgery center.  FAILURE TO FOLLOW THESE INSTRUCTIONS MAY RESULT IN THE CANCELLATION OF YOUR SURGERY PATIENT SIGNATURE_________________________________  NURSE SIGNATURE__________________________________  ________________________________________________________________________

## 2016-04-03 NOTE — H&P (Signed)
Office Visit Report     03/19/2016   --------------------------------------------------------------------------------   Amber Snow  MRN: [id number]  PRIMARY CARE:  Evie Lacks. Plotnikov, MD  DOB: [birth year], 82 year old Female  REFERRING:  Georgette Dover, MD  SSN: -[id number]  PROVIDER:  Festus Aloe, M.D.    LOCATION:  [facility] [phone]   --------------------------------------------------------------------------------   CC: I have bladder cancer that has been treated.  HPI: Amber Snow is a 82 year-old female established patient who is here for follow-up of bladder cancer treatment.  Her bladder cancer was superficial and limitied to the bladder lining. Her bladder cancer was not muscle invasive. She had treatment with the following intravesical agents: bcg. Patient denies mitomycin, interferon, and adriamycin.   She did have a TURBT. Her last bladder tumor was resected approximately 11/30/2015. She has had the following number of bladder resections: 4.   She has not had blood in her urine recently. She is not having pain in new locations.   Her last cysto was 07/31/2015. Her last radiologic test to evaluate the kidneys was 07/31/2015.   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   Last upper tract: Apr 2017 RGP's nl; CT Feb 2016  Last intravesical: [facility] Oct 2017l; BCG Jan 2017 second induction  Last upper tract: Aug 2017 CT benign; Apr 2017 - RGP's normal   She complains of about a 20 pound weight loss over the past 4-6 months, so I scanned her. An 12/19/2015 CT was benign apart from some small pulm nodules. Repeat CT Chest benign Nov 2017. She has been well. She completed [facility] bladder instillation with Dr. Alen Blew Oct 2017.     ALLERGIES: Boniva KIT Cipro TABS - Swelling Penicillins - rash     MEDICATIONS: Tamsulosin Hcl 0.4 mg capsule, ext release 24 hr 1 capsule PO Q HS PRN  Albuterol Sulfate TABS Oral  Ibuprofen 800 MG Oral Tablet Oral  Losartan Potassium 50 MG Oral Tablet Oral  ProAir HFA 108 (90 Base) MCG/ACT Inhalation Aerosol Solution Inhalation  Reclast 5 MG/100ML Intravenous Solution Intravenous  Symbicort 160-4.5 MCG/ACT Inhalation Aerosol Inhalation  TraZODone HCl - 50 MG Oral Tablet Oral  Vitamin D TABS Oral     GU PSH: Cysto Bladder Stone >2.5cm - 2014 Cysto Bladder Ureth Biopsy - 02/20/2015 Cysto Fulgurate < 0.5 cm - 08/13/2015, 2016, 2014 Cystoscopy - 11/15/2015 Cystoscopy Insert Stent - 02/20/2015 Cystoscopy TURBT <2 cm - 11/27/2015, 02/20/2015 Cystoscopy TURBT 2-5 cm - 2013 Locm 300-399Mg/Ml Iodine,1Ml - 03/14/2016, 12/19/2015 Renal ESWL - 2009      PSH Notes: Cystoscopy With Fulguration Minor Lesion (Under 36m), Cystoscopy With Fulguration Small Lesion (5-21m, Cystoscopy With Biopsy, Cystoscopy With Insertion Of Ureteral Stent Right, Cystoscopy With Fulguration Minor Lesion (Under 27m20m Cystoscopy With Fragmentation Of Bladder Calculus Over 2.5cm, Cystoscopy With Fulguration Minor Lesion (Under 27mm51mCystoscopy With Fulguration Medium Lesion (2-5cm), Lithotripsy, Hand Surgery, Tonsillectomy, Cataract Surgery   NON-GU PSH: Remove Tonsils - 2009    GU PMH: Bladder Cancer Lateral - 01/29/2016, - 12/14/2015, - 11/15/2015 Flank Pain - 01/29/2016 Bladder Cancer, overlapping sites, Primary malignant neoplasm of overlapping sites of bladder - 09/07/2015 Bladder Cancer, Unspec, Malignant tumor of urinary bladder - 07/06/2015 Bladder Cancer Posterior, Malignant neoplasm of posterior wall of urinary bladder - 04/02/2015 Acute pyelonephritis, Pyelonephritis,  acute - 03/04/2015 Urinary Tract Inf, Unspec site, Acute UTI - 03/04/2015, Urinary tract infection, - 2016 Crossing vessel and stricture of ureter w/o hydronephrosis, Ureteral obstruction - 02/13/2015 Kidney Stone,  Nephrolithiasis - 2016, Kidney stone on left side, - 2015, Kidney stone on right side, - 2015 Bladder Stone, Bladder calculus - 2014 Calculus Ureter, Calculus of distal right ureter - 2014, Calculus of left ureter, - 2014, Ureteral Stone, - 2014 Hematuria, Unspec, Hematuria - 2014 Personal Hx urinary calculi, Nephrolithiasis - 2014    NON-GU PMH: Encounter for general adult medical examination without abnormal findings, Encounter for preventive health examination - 07/09/2015 Solitary pulmonary nodule, Lung nodule - 2016    FAMILY HISTORY: Family Health Status Number - Runs In Family Father Deceased At [place] ___ - Runs In Family Heart Disease - Father Hypertension - Mother Mother Deceased At Age 21 from diabetic complicati - Runs In Family nephrolithiasis - Runs In Family   SOCIAL HISTORY: Marital Status: Married Current Smoking Status: Patient does not smoke anymore.  Uses smokeless tobacco. Drinks 2 caffeinated drinks per day. Patient's occupation is/was retired.     Notes: Former smoker, Tobacco use, Alcohol Use, Caffeine Use, Marital History - Currently Married   REVIEW OF SYSTEMS:    GU Review Female:   Patient reports get up at night to urinate. Patient denies frequent urination, hard to postpone urination, burning /pain with urination, leakage of urine, stream starts and stops, trouble starting your stream, have to strain to urinate, and currently pregnant.  Gastrointestinal (Upper):   Patient denies nausea, vomiting, and indigestion/ heartburn.  Gastrointestinal (Lower):   Patient denies diarrhea and constipation.  Constitutional:   Patient reports fatigue. Patient denies fever, night sweats, and weight loss.  Skin:   Patient denies skin rash/ lesion and itching.  Eyes:   Patient denies blurred vision and double vision.  Ears/ Nose/ Throat:   Patient denies sore throat and sinus problems.  Hematologic/Lymphatic:   Patient denies swollen glands and easy bruising.   Cardiovascular:   Patient denies leg swelling and chest pains.  Respiratory:   Patient reports shortness of breath. Patient denies cough.  Endocrine:   Patient denies excessive thirst.  Musculoskeletal:   Patient reports back pain. Patient denies joint pain.  Neurological:   Patient denies headaches and dizziness.  Psychologic:   Patient denies depression and anxiety.   VITAL SIGNS:      03/19/2016 10:25 AM  Weight 90 lb / 40.82 kg  BP 122/64 mmHg  Heart Rate 82 /min   MULTI-SYSTEM PHYSICAL EXAMINATION:    Constitutional: Well-nourished. No physical deformities. Normally developed. Good grooming.  Neck: Neck symmetrical, not swollen. Normal tracheal position.  Respiratory: No labored breathing, no use of accessory muscles.   Cardiovascular: Normal temperature, normal extremity pulses, no swelling, no varicosities.  Neurologic / Psychiatric: Oriented to time, oriented to place, oriented to person. No depression, no anxiety, no agitation.     PAST DATA REVIEWED:  Source Of History:  Patient  X-Ray Review: C.T. Chest: Reviewed Films.  C.T. Abdomen/Pelvis: Reviewed Films.     PROCEDURES:         Flexible Cystoscopy - 52000  Risks, benefits, and some of the potential complications of the procedure were discussed at length with the patient including infection, bleeding, voiding discomfort, urinary retention, fever, chills, sepsis, and others. All questions were answered. Informed consent was obtained. Antibiotic prophylaxis was given. Sterile technique and intraurethral analgesia were used.  Meatus:  Normal size. Normal location. Normal condition.  Urethra:  No hypermobility. No leakage.  Ureteral Orifices:  Normal location. Normal size. Normal shape. Effluxed clear urine.  Bladder:  Multifocal tumors - left and right BN, left posterior. No trabeculation. Normal mucosa. No stones.      The lower urinary tract was carefully examined. The procedure was well-tolerated and without  complications. Antibiotic instructions were given. Instructions were given to call the office immediately for bloody urine, difficulty urinating, urinary retention, painful or frequent urination, fever, chills, nausea, vomiting or other illness. The patient stated that she understood these instructions and would comply with them.         Urinalysis w/Scope - 81001 Dipstick Dipstick Cont'd Micro  Color: Yellow Bilirubin: Neg WBC/hpf: 0 - 5/hpf  Appearance: Clear Ketones: Neg RBC/hpf: 0 - 2/hpf  Specific Gravity: 1.025 Blood: Trace Bacteria: NS (Not Seen)  pH: 6.0 Protein: Trace Cystals: NS (Not Seen)  Glucose: Neg Urobilinogen: 0.2 Casts: NS (Not Seen)    Nitrites: Neg Trichomonas: Not Present    Leukocyte Esterase: Neg Mucous: Not Present      Epithelial Cells: 0 - 5/hpf      Yeast: NS (Not Seen)      Sperm: Not Present    Notes: unconcentrated microscopic    ASSESSMENT:      ICD-10 Details  1 GU:   Bladder Cancer Lateral - C67.2    PLAN:           Schedule Return Visit: Next Available Appointment - Office Visit, Schedule Surgery          Document Letter(s):  Created for Patient: Clinical Summary         Notes:   bladder tumors - multifocal recurrence today but much smaller and less numerous than prior to [facility]. Discussed nature r/b/a to cysto, bbx and fulguration and she elects to proceed.     * Signed by Festus Aloe, M.D. on 04/03/16 at 11:56 AM (EST)*     The information contained in this medical record document is considered private and confidential patient information. This information can only be used for the medical diagnosis and/or medical services that are being provided by the patient's selected caregivers. This information can only be distributed outside of the patient's care if the patient agrees and signs waivers of authorization for this information to be sent to an outside source or route.

## 2016-04-04 ENCOUNTER — Encounter (HOSPITAL_COMMUNITY): Payer: Self-pay | Admitting: *Deleted

## 2016-04-04 ENCOUNTER — Encounter (HOSPITAL_COMMUNITY)
Admission: RE | Disposition: A | Discharge status: Home / Self Care | Payer: Self-pay | Source: Ambulatory Visit | Attending: Urology

## 2016-04-04 ENCOUNTER — Ambulatory Visit (HOSPITAL_COMMUNITY)
Admission: RE | Admit: 2016-04-04 | Discharge: 2016-04-04 | Disposition: A | Discharge status: Home / Self Care | Payer: Medicare Other | Source: Ambulatory Visit | Attending: Urology | Admitting: Urology

## 2016-04-04 ENCOUNTER — Ambulatory Visit (HOSPITAL_COMMUNITY): Payer: Medicare Other | Admitting: Anesthesiology

## 2016-04-04 DIAGNOSIS — Z888 Allergy status to other drugs, medicaments and biological substances status: Secondary | ICD-10-CM | POA: Insufficient documentation

## 2016-04-04 DIAGNOSIS — Z91013 Allergy to seafood: Secondary | ICD-10-CM | POA: Insufficient documentation

## 2016-04-04 DIAGNOSIS — Z881 Allergy status to other antibiotic agents status: Secondary | ICD-10-CM | POA: Insufficient documentation

## 2016-04-04 DIAGNOSIS — D494 Neoplasm of unspecified behavior of bladder: Secondary | ICD-10-CM | POA: Diagnosis not present

## 2016-04-04 DIAGNOSIS — I509 Heart failure, unspecified: Secondary | ICD-10-CM | POA: Insufficient documentation

## 2016-04-04 DIAGNOSIS — Z720 Tobacco use: Secondary | ICD-10-CM | POA: Diagnosis not present

## 2016-04-04 DIAGNOSIS — I2510 Atherosclerotic heart disease of native coronary artery without angina pectoris: Secondary | ICD-10-CM | POA: Diagnosis not present

## 2016-04-04 DIAGNOSIS — I110 Hypertensive heart disease with heart failure: Secondary | ICD-10-CM | POA: Diagnosis not present

## 2016-04-04 DIAGNOSIS — Z886 Allergy status to analgesic agent status: Secondary | ICD-10-CM | POA: Diagnosis not present

## 2016-04-04 DIAGNOSIS — Z880 Allergy status to penicillin: Secondary | ICD-10-CM | POA: Diagnosis not present

## 2016-04-04 DIAGNOSIS — J449 Chronic obstructive pulmonary disease, unspecified: Secondary | ICD-10-CM | POA: Insufficient documentation

## 2016-04-04 DIAGNOSIS — Z885 Allergy status to narcotic agent status: Secondary | ICD-10-CM | POA: Diagnosis not present

## 2016-04-04 DIAGNOSIS — C678 Malignant neoplasm of overlapping sites of bladder: Secondary | ICD-10-CM | POA: Insufficient documentation

## 2016-04-04 DIAGNOSIS — C679 Malignant neoplasm of bladder, unspecified: Secondary | ICD-10-CM | POA: Diagnosis not present

## 2016-04-04 DIAGNOSIS — I5022 Chronic systolic (congestive) heart failure: Secondary | ICD-10-CM | POA: Diagnosis not present

## 2016-04-04 HISTORY — PX: CYSTOSCOPY WITH BIOPSY: SHX5122

## 2016-04-04 SURGERY — CYSTOSCOPY, WITH BIOPSY
Anesthesia: General

## 2016-04-04 MED ORDER — CEFAZOLIN SODIUM-DEXTROSE 2-4 GM/100ML-% IV SOLN
INTRAVENOUS | Status: AC
  Filled 2016-04-04: qty 100

## 2016-04-04 MED ORDER — HYDROMORPHONE HCL 1 MG/ML IJ SOLN: 0.2500 mg | INTRAMUSCULAR | Status: DC | PRN

## 2016-04-04 MED ORDER — SODIUM CHLORIDE 0.9 % IR SOLN
Status: DC | PRN
  Administered 2016-04-04: 6000 mL via INTRAVESICAL

## 2016-04-04 MED ORDER — SODIUM CHLORIDE 0.9 % IV SOLN
50.0000 mg | Freq: Once | INTRAVENOUS | Status: DC
  Filled 2016-04-04: qty 25

## 2016-04-04 MED ORDER — PROPOFOL 10 MG/ML IV BOLUS
INTRAVENOUS | Status: DC | PRN
  Administered 2016-04-04: 100 mg via INTRAVENOUS

## 2016-04-04 MED ORDER — PROMETHAZINE HCL 25 MG/ML IJ SOLN: 6.2500 mg | INTRAMUSCULAR | Status: DC | PRN

## 2016-04-04 MED ORDER — ONDANSETRON HCL 4 MG/2ML IJ SOLN
INTRAMUSCULAR | Status: AC
  Filled 2016-04-04: qty 2

## 2016-04-04 MED ORDER — NITROFURANTOIN MACROCRYSTAL 50 MG PO CAPS: 50.0000 mg | ORAL_CAPSULE | Freq: Every day | ORAL | 0 refills | Status: DC

## 2016-04-04 MED ORDER — PHENYLEPHRINE 40 MCG/ML (10ML) SYRINGE FOR IV PUSH (FOR BLOOD PRESSURE SUPPORT)
PREFILLED_SYRINGE | INTRAVENOUS | Status: AC
  Filled 2016-04-04: qty 10

## 2016-04-04 MED ORDER — FENTANYL CITRATE (PF) 100 MCG/2ML IJ SOLN
INTRAMUSCULAR | Status: DC | PRN
  Administered 2016-04-04: 25 ug via INTRAVENOUS
  Administered 2016-04-04: 50 ug via INTRAVENOUS
  Administered 2016-04-04: 25 ug via INTRAVENOUS

## 2016-04-04 MED ORDER — PHENYLEPHRINE HCL 10 MG/ML IJ SOLN
INTRAMUSCULAR | Status: DC | PRN
  Administered 2016-04-04 (×4): 80 ug via INTRAVENOUS

## 2016-04-04 MED ORDER — FENTANYL CITRATE (PF) 100 MCG/2ML IJ SOLN
INTRAMUSCULAR | Status: AC
  Filled 2016-04-04: qty 2

## 2016-04-04 MED ORDER — ONDANSETRON HCL 4 MG/2ML IJ SOLN
INTRAMUSCULAR | Status: DC | PRN
  Administered 2016-04-04: 4 mg via INTRAVENOUS

## 2016-04-04 MED ORDER — STERILE WATER FOR IRRIGATION IR SOLN
Status: DC | PRN
  Administered 2016-04-04: 3000 mL

## 2016-04-04 MED ORDER — CEFAZOLIN SODIUM-DEXTROSE 2-4 GM/100ML-% IV SOLN: 2.0000 g | INTRAVENOUS | Status: DC

## 2016-04-04 MED ORDER — LACTATED RINGERS IV SOLN
INTRAVENOUS | Status: DC
  Administered 2016-04-04 (×2): via INTRAVENOUS

## 2016-04-04 MED ORDER — LIDOCAINE HCL (CARDIAC) 20 MG/ML IV SOLN
INTRAVENOUS | Status: DC | PRN
  Administered 2016-04-04: 50 mg via INTRAVENOUS

## 2016-04-04 SURGICAL SUPPLY — 11 items
BAG URINE DRAINAGE (UROLOGICAL SUPPLIES) ×2 IMPLANT
BAG URO CATCHER STRL LF (MISCELLANEOUS) ×3 IMPLANT
CATH FOLEY 2WAY SLVR  5CC 16FR (CATHETERS) ×2
CATH FOLEY 2WAY SLVR 5CC 16FR (CATHETERS) IMPLANT
GLOVE BIOGEL M STRL SZ7.5 (GLOVE) ×3 IMPLANT
GOWN STRL REUS W/TWL XL LVL3 (GOWN DISPOSABLE) ×3 IMPLANT
LOOP CUT BIPOLAR 24F LRG (ELECTROSURGICAL) ×2 IMPLANT
MANIFOLD NEPTUNE II (INSTRUMENTS) ×3 IMPLANT
PACK CYSTO (CUSTOM PROCEDURE TRAY) ×3 IMPLANT
TUBING CONNECTING 10 (TUBING) ×2 IMPLANT
TUBING CONNECTING 10' (TUBING) ×1

## 2016-04-04 NOTE — Discharge Instructions (Signed)
Foley Catheter Care, Adult °A Foley catheter is a soft, flexible tube. This tube is placed into your bladder to drain pee (urine). If you go home with this catheter in place, follow the instructions below. °TAKING CARE OF THE CATHETER °1. Wash your hands with soap and water. °2. Put soap and water on a clean washcloth. °¨ Clean the skin where the tube goes into your body. °§ Clean away from the tube site. °§ Never wipe toward the tube. °§ Clean the area using a circular motion. °¨ Remove all the soap. Pat the area dry with a clean towel. For males, reposition the skin that covers the end of the penis (foreskin). °3. Attach the tube to your leg with tape or a leg strap. Do not stretch the tube tight. If you are using tape, remove any stickiness left behind by past tape you used. °4. Keep the drainage bag below your hips. Keep it off the floor. °5. Check your tube during the day. Make sure it is working and draining. Make sure the tube does not curl, twist, or bend. °6. Do not pull on the tube or try to take it out. °TAKING CARE OF THE DRAINAGE BAGS °You will have a large overnight drainage bag and a small leg bag. You may wear the overnight bag any time. Never wear the small bag at night. Follow the directions below. °Emptying the Drainage Bag  °Empty your drainage bag when it is ?-½ full or at least 2-3 times a day. °1. Wash your hands with soap and water. °2. Keep the drainage bag below your hips. °3. Hold the dirty bag over the toilet or clean container. °4. Open the pour spout at the bottom of the bag. Empty the pee into the toilet or container. Do not let the pour spout touch anything. °5. Clean the pour spout with a gauze pad or cotton ball that has rubbing alcohol on it. °6. Close the pour spout. °7. Attach the bag to your leg with tape or a leg strap. °8. Wash your hands well. °Changing the Drainage Bag  °Change your bag once a month or sooner if it starts to smell or look dirty.  °1. Wash your hands with  soap and water. °2. Pinch the rubber tube so that pee does not spill out. °3. Disconnect the catheter tube from the drainage tube at the connection valve. Do not let the tubes touch anything. °4. Clean the end of the catheter tube with an alcohol wipe. Clean the end of a the drainage tube with a different alcohol wipe. °5. Connect the catheter tube to the drainage tube of the clean drainage bag. °6. Attach the new bag to the leg with tape or a leg strap. Avoid attaching the new bag too tightly. °7. Wash your hands well. °Cleaning the Drainage Bag  °1. Wash your hands with soap and water. °2. Wash the bag in warm, soapy water. °3. Rinse the bag with warm water. °4. Fill the bag with a mixture of white vinegar and water (1 cup vinegar to 1 quart warm water [.2 liter vinegar to 1 liter warm water]). Close the bag and soak it for 30 minutes in the solution. °5. Rinse the bag with warm water. °6. Hang the bag to dry with the pour spout open and hanging downward. °7. Store the clean bag (once it is dry) in a clean plastic bag. °8. Wash your hands well. °PREVENT INFECTION °· Wash your hands before and after touching   your tube.  Take showers every day. Wash the skin where the tube enters your body. Do not take baths. Replace wet leg straps with dry ones, if this applies.  Do not use powders, sprays, or lotions on the genital area. Only use creams, lotions, or ointments as told by your doctor.  For females, wipe from front to back after going to the bathroom.  Drink enough fluids to keep your pee clear or pale yellow unless you are told not to have too much fluid (fluid restriction).  Do not let the drainage bag or tubing touch or lie on the floor.  Wear cotton underwear to keep the area dry. GET HELP IF:  Your pee is cloudy or smells unusually bad.  Your tube becomes clogged.  You are not draining pee into the bag or your bladder feels full.  Your tube starts to leak. GET HELP RIGHT AWAY IF:  You  have pain, puffiness (swelling), redness, or yellowish-white fluid (pus) where the tube enters the body.  You have pain in the belly (abdomen), legs, lower back, or bladder.  You have a fever.  You see blood fill the tube, or your pee is pink or red.  You feel sick to your stomach (nauseous), throw up (vomit), or have chills.  Your tube gets pulled out. MAKE SURE YOU:   Understand these instructions.  Will watch your condition.  Will get help right away if you are not doing well or get worse. This information is not intended to replace advice given to you by your health care provider. Make sure you discuss any questions you have with your health care provider. Document Released: 08/09/2012 Document Revised: 05/05/2014 Document Reviewed: 03/31/2015 Elsevier Interactive Patient Education  2017 [place] Anesthesia, Adult, Care After These instructions provide you with information about caring for yourself after your procedure. Your health care provider may also give you more specific instructions. Your treatment has been planned according to current medical practices, but problems sometimes occur. Call your health care provider if you have any problems or questions after your procedure. What can I expect after the procedure? After the procedure, it is common to have:  Vomiting.  A sore throat.  Mental slowness. It is common to feel:  Nauseous.  Cold or shivery.  Sleepy.  Tired.  Sore or achy, even in parts of your body where you did not have surgery. Follow these instructions at home: For at least 24 hours after the procedure:  Do not:  Participate in activities where you could fall or become injured.  Drive.  Use heavy machinery.  Drink alcohol.  Take sleeping pills or medicines that cause drowsiness.  Make important decisions or sign legal documents.  Take care of children on your own.  Rest. Eating and drinking  If you vomit, drink  water, juice, or soup when you can drink without vomiting.  Drink enough fluid to keep your urine clear or pale yellow.  Make sure you have little or no nausea before eating solid foods.  Follow the diet recommended by your health care provider. General instructions  Have a responsible adult stay with you until you are awake and alert.  Return to your normal activities as told by your health care provider. Ask your health care provider what activities are safe for you.  Take over-the-counter and prescription medicines only as told by your health care provider.  If you smoke, do not smoke without supervision.  Keep all follow-up visits  as told by your health care provider. This is important. Contact a health care provider if:  You continue to have nausea or vomiting at home, and medicines are not helpful.  You cannot drink fluids or start eating again.  You cannot urinate after 8-12 hours.  You develop a skin rash.  You have fever.  You have increasing redness at the site of your procedure. Get help right away if:  You have difficulty breathing.  You have chest pain.  You have unexpected bleeding.  You feel that you are having a life-threatening or urgent problem. This information is not intended to replace advice given to you by your health care provider. Make sure you discuss any questions you have with your health care provider. Document Released: 07/21/2000 Document Revised: 09/17/2015 Document Reviewed: 03/29/2015 Elsevier Interactive Patient Education  2017 [employer].

## 2016-04-04 NOTE — Progress Notes (Signed)
Dr. Junious Silk in to talk with pt; pt will not receive epirubicin treatment in PACU

## 2016-04-04 NOTE — Transfer of Care (Signed)
Immediate Anesthesia Transfer of Care Note  Patient: Amber Snow  Procedure(s) Performed: Procedure(s): CYSTOSCOPY WITH BIOPSY AND FULGURATION EPIRUBICIN BLADDER INSTILLATION (N/A)  Patient Location: PACU  Anesthesia Type:General  Level of Consciousness:  sedated, patient cooperative and responds to stimulation  Airway & Oxygen Therapy:Patient Spontanous Breathing and Patient connected to face mask oxgen  Post-op Assessment:  Report given to PACU RN and Post -op Vital signs reviewed and stable  Post vital signs:  Reviewed and stable  Last Vitals:  Vitals:   04/04/16 1010 04/04/16 1326  BP: 123/67 136/81  Pulse: 90 84  Resp: 16 (!) 23  Temp: 36.4 C (P) [id number]    Complications: No apparent anesthesia complications

## 2016-04-04 NOTE — Anesthesia Postprocedure Evaluation (Signed)
Anesthesia Post Note  Patient: Amber Snow  Procedure(s) Performed: Procedure(s) (LRB): CYSTOSCOPY WITH BIOPSY AND FULGURATION EPIRUBICIN BLADDER INSTILLATION (N/A)  Patient location during evaluation: PACU Anesthesia Type: General Level of consciousness: sedated Pain management: pain level controlled Vital Signs Assessment: post-procedure vital signs reviewed and stable Respiratory status: spontaneous breathing and respiratory function stable Cardiovascular status: stable Anesthetic complications: no    Last Vitals:  Vitals:   04/04/16 1010 04/04/16 1326  BP: 123/67 136/81  Pulse: 90 84  Resp: 16 (!) 23  Temp: 36.4 C 36.4 C    Last Pain:  Vitals:   04/04/16 1400  TempSrc:   PainSc: 0-No pain                 [name]

## 2016-04-04 NOTE — Interval H&P Note (Signed)
History and Physical Interval Note:  04/04/2016 11:48 AM  Amber Snow  has presented today for surgery, with the diagnosis of lateral bladder cancer  The various methods of treatment have been discussed with the patient and family. After consideration of risks, benefits and other options for treatment, the patient has consented to  Procedure(s): CYSTOSCOPY WITH BIOPSY AND FULGURATION EPIRUBICIN BLADDER INSTILLATION (N/A) as a surgical intervention .  The patient's history has been reviewed, patient examined, no change in status, stable for surgery.  I have reviewed the patient's chart and labs.  Questions were answered to the patient's satisfaction.  She has been well. No dysuria or fever.    Lesli Issa

## 2016-04-04 NOTE — Anesthesia Procedure Notes (Signed)
Procedure Name: LMA Insertion Date/Time: 04/04/2016 12:36 PM Performed by: Cylie Dor, Virgel Gess Pre-anesthesia Checklist: Patient identified, Emergency Drugs available, Suction available and Patient being monitored Patient Re-evaluated:Patient Re-evaluated prior to inductionOxygen Delivery Method: Circle System Utilized Preoxygenation: Pre-oxygenation with 100% oxygen Intubation Type: IV induction Ventilation: Mask ventilation without difficulty LMA: LMA with gastric port inserted LMA Size: 4.0 Number of attempts: 1 Airway Equipment and Method: Bite block Placement Confirmation: positive ETCO2 Tube secured with: Tape Dental Injury: Teeth and Oropharynx as per pre-operative assessment

## 2016-04-04 NOTE — Op Note (Signed)
Preoperative diagnosis: Bladder cancer, overlapping sites Postoperative diagnosis: Same  Procedure: TURBT 2-5 cm  Surgeon: Junious Silk  Anesthesia: Gen.  Indication for procedure 82 year old with recurrent bladder cancer.  Findings: On cystoscopy there were scattered areas of fine early papillary recurrence. These tumors appeared superficial and pushed off the surface of the underlying detrusor. There was a patch between the left bladder neck and left UO on the trigone, left anterior, two small areas right dome, and a patch just anteriolateral to the right ureteral orifice. Her prior biopsy sites and developed some dystrophic calcification as she is prone to getting. I tried to sweep most of this off.   The ureteral orifices were visualized and had clear efflux. There were not involved.  Description of procedure: After consent was obtained patient brought to the operating room. After adequate anesthesia she was placed in lithotomy position and prepped and draped in the usual sterile fashion. A timeout was performed to confirm the patient and procedure. The cystoscope was passed per urethra and the bladder carefully inspected with the 30 and 70 lens. Given these were early recurrences and rather sessile polyp the loop would be better to resect and ablate these tumors. Therefore the continuous-flow sheath was passed with the visual obturator, then the loop was passed. I started at the left trigonal area and resected these tumors. These were representative of the tumors. They appeared superficial. The left ureteral orifice was not involved. I then approached the right lateral patch and using the coag the tumor swept right off the surface and a lightly cauterized the underlying tissue. I used a similar technique for 2 patches right anteriorly and the left anterior patch. I reinserted the cystoscope with the 70 lens and there was one small area on the right bladder neck and this was fulgurated with the  loop. All visible tumor had been eradicated and I sent some of the resection for path. Hemostasis was excellent at low-pressure. The bladder was drained and the scope removed. A 16 French Foley catheter was placed and left gravity drainage. She was awakened and taken to the recovery room in stable condition. Given the multifocal areas of resection and ablation I plan to leave a catheter for a few days and not give epirubicin.  Complications: None  Blood loss: Minimal  Specimens to pathology: Bladder tumor  Disposition: Patient stable to PACU

## 2016-04-04 NOTE — Anesthesia Preprocedure Evaluation (Addendum)
Anesthesia Evaluation  Patient identified by MRN, date of birth, ID band Patient awake    Reviewed: Allergy & Precautions, NPO status , Patient's Chart, lab work & pertinent test results  History of Anesthesia Complications Negative for: history of anesthetic complications  Airway Mallampati: I  TM Distance: >3 FB Neck ROM: Full    Dental no notable dental hx. (+) Poor Dentition, Dental Advisory Given   Pulmonary COPD,  COPD inhaler, former smoker,    Pulmonary exam normal        Cardiovascular hypertension, Pt. on medications + CAD and +CHF  Normal cardiovascular exam  05-17-2013minimal nonobstructive CAD per cath with EF of 35 to 40% and normal filling pressures and right heart pressures-Dr. Martin[place] follows Study Conclusions  - Left ventricle: The cavity size was normal. Wall thickness was normal. Systolic function was moderately reduced. The estimated ejection fraction was in the range of 35% to 40%. Doppler parameters are consistent with abnormal left ventricular relaxation (grade 1 diastolic dysfunction). - Ventricular septum: Septal motion showed abnormal function and dyssynergy.   Neuro/Psych negative neurological ROS  negative psych ROS   GI/Hepatic negative GI ROS, Neg liver ROS,   Endo/Other    Renal/GU      Musculoskeletal   Abdominal   Peds  Hematology   Anesthesia Other Findings   Reproductive/Obstetrics                           Anesthesia Physical  Anesthesia Plan  ASA: III  Anesthesia Plan: General   Post-op Pain Management:    Induction: Intravenous  Airway Management Planned: LMA  Additional Equipment:   Intra-op Plan:   Post-operative Plan: Extubation in OR  Informed Consent: I have reviewed the patients History and Physical, chart, labs and discussed the procedure including the risks, benefits and alternatives for the proposed anesthesia with  the patient or authorized representative who has indicated his/her understanding and acceptance.   Dental advisory given  Plan Discussed with: CRNA and Surgeon  Anesthesia Plan Comments:        Anesthesia Quick Evaluation

## 2016-04-07 ENCOUNTER — Encounter (HOSPITAL_COMMUNITY): Payer: Self-pay | Admitting: Urology

## 2016-04-09 ENCOUNTER — Other Ambulatory Visit: Payer: Medicare Other

## 2016-04-09 DIAGNOSIS — C672 Malignant neoplasm of lateral wall of bladder: Secondary | ICD-10-CM | POA: Diagnosis not present

## 2016-04-17 ENCOUNTER — Ambulatory Visit
Admission: RE | Admit: 2016-04-17 | Discharge: 2016-04-17 | Disposition: A | Discharge status: Home / Self Care | Payer: Medicare Other | Source: Ambulatory Visit | Attending: Obstetrics | Admitting: Obstetrics

## 2016-04-17 DIAGNOSIS — M810 Age-related osteoporosis without current pathological fracture: Secondary | ICD-10-CM | POA: Diagnosis not present

## 2016-04-17 DIAGNOSIS — Z780 Asymptomatic menopausal state: Secondary | ICD-10-CM | POA: Diagnosis not present

## 2016-04-17 DIAGNOSIS — M8580 Other specified disorders of bone density and structure, unspecified site: Secondary | ICD-10-CM

## 2016-04-18 DIAGNOSIS — R300 Dysuria: Secondary | ICD-10-CM | POA: Diagnosis not present

## 2016-04-18 DIAGNOSIS — R350 Frequency of micturition: Secondary | ICD-10-CM | POA: Diagnosis not present

## 2016-04-22 DIAGNOSIS — N390 Urinary tract infection, site not specified: Secondary | ICD-10-CM | POA: Diagnosis not present

## 2016-04-23 DIAGNOSIS — N390 Urinary tract infection, site not specified: Secondary | ICD-10-CM | POA: Diagnosis not present

## 2016-04-24 DIAGNOSIS — N390 Urinary tract infection, site not specified: Secondary | ICD-10-CM | POA: Diagnosis not present

## 2016-05-03 ENCOUNTER — Other Ambulatory Visit: Payer: Self-pay | Admitting: Internal Medicine

## 2016-05-08 DIAGNOSIS — M810 Age-related osteoporosis without current pathological fracture: Secondary | ICD-10-CM | POA: Diagnosis not present

## 2016-05-09 DIAGNOSIS — R300 Dysuria: Secondary | ICD-10-CM | POA: Diagnosis not present

## 2016-05-12 DIAGNOSIS — C44722 Squamous cell carcinoma of skin of right lower limb, including hip: Secondary | ICD-10-CM | POA: Diagnosis not present

## 2016-05-15 ENCOUNTER — Ambulatory Visit: Payer: Medicare Other | Admitting: Internal Medicine

## 2016-05-19 ENCOUNTER — Ambulatory Visit (INDEPENDENT_AMBULATORY_CARE_PROVIDER_SITE_OTHER): Payer: Medicare Other | Admitting: Internal Medicine

## 2016-05-19 ENCOUNTER — Encounter: Payer: Self-pay | Admitting: Internal Medicine

## 2016-05-19 VITALS — BP 120/70 | HR 100 | Ht 63.0 in | Wt 89.0 lb

## 2016-05-19 DIAGNOSIS — J449 Chronic obstructive pulmonary disease, unspecified: Secondary | ICD-10-CM

## 2016-05-19 NOTE — Progress Notes (Signed)
Subjective:   Patient ID: Amber Snow, female    DOB: [birth year]  MRN: [id number]   Brief patient profile:  43  yowf with GOLD II copd by pfts 07/2009 quit smoking June 2013  due to progressive doe x 5 years worse since quit referred to pulmonary clinic 06/17/2012 by Dr Ignacia Palma    History of Present Illness  07/10/2015  f/u ov/Xaiver Amber Snow re: GOLD II/ could not afford bevespi and not convinced better than symbicort   Chief Complaint  Patient presents with  . Follow-up    Increased SOB x 2 wks. She has not felt well in general. She also c/o cough- mainly non prod. She is using proair once daily on average.   onset gradually worse x 2 weeks with any activity >  ? If better with albuterol ? Better with prednisone and feels worse off it in terms of energy but not cough/sob which is worse with use of arms. >>cont on Symbicort and Spiriva   08/09/2015 Follow up : Amber Snow/COPD GOLD II  Pt returns for 1 month follow up and med review  Did not bring her meds today. She was upset that she is not seeing Dr. Melvyn Novas  Today.  Offered to reschedule x 2 but pt declined.  Complains of that her mouth is very sore.  Says can not tolerate Spiriva . Dries her out way too much , causes her tongue to be too sore.  Is taking Symbicort Twice daily  , says she rinses well after use.  Complains of 1 week of increased cough, congestion with green mucus. More sob than usual. No wheezing , chest pain, hemoptysis or fever.  Followed by Urology for bladder cancer . Had cystoscopy 07/31/15  Rec Doxycycline 100 mg twice daily for 1 week, take with food. Use sunscreen. If outside as this antibiotic may cause sunburn Mucinex DM twice daily as needed for cough and congestion. Mycelex lozenges 5 times daily for 1 week Continue on Symbicort 2 puffs twice daily, rinse after each use   11/08/2015  f/u ov/Amber Snow re: GOLD II copd/ symbicort 160 2bid / no med calendar  Chief Complaint  Patient presents with  . Follow-up   Breathing is some worse- relates to humid weather. Cough is prod with white sputum. She has noticed increased wheezing. She is using albuterol once per wk on average.   sleeping ok/ note aecopd @ last ov and responded back to baseline  Ok walking indoors = MMRC2 = can't walk a nl pace on a flat grade s sob but does fine slow and flat eg [employer] but not mall  C/o swelling in legs/ dependent daytime despite cozaar as her bp med and using lasix 20 mg prn  rec Discuss with Dr Alain Marion add hctz to your cozar  No change in pulmonary medications     01/22/2016  f/u ov/Amber Snow re: GOLD II  copd/ symbicort 160 2bid  Chief Complaint  Patient presents with  . Follow-up    c/o sob worse 3-4 days ago,was using Albuterol 1x day, little better now,cough-thick white,chest tightness,denies cp,  no change ex tol Never took more than one proair daily and improved since flare of cough  Doe still  = mmrc 2  rec Plan A = Automatic = Symbicort 160 Take 2 puffs first thing in am and then another 2 puffs about 12 hours later.  Plan B = Backup Only use your albuterol as a rescue medication to be used if you can't catch your  breath     05/19/2016  f/u ov/Amber Snow re: GOLD II copd / symb 160 2bid and saba 3 x week Chief Complaint  Patient presents with  . Follow-up    Breathing worse today and she states she blames this on the damp weather. She has occ wheezing and cough.  Cough is occ prod with white sputum.    sleeping ok/ min am congestion but nothing purulent  Doe still  MMRC2 = can't walk a nl pace on a flat grade s sob but does fine slow and flat eg shopping pushing cart around [place]       No obvious patterns (other than worse in damp weather) in day to day or daytime variability or assoc   mucus plugs or hemoptysis or cp or  subjective wheeze or overt sinus or hb symptoms. No unusual exp hx or h/o childhood pna/ asthma or knowledge of premature birth.  Sleeping ok without nocturnal  or early am exacerbation   of respiratory  c/o's or need for noct saba. Also denies any obvious fluctuation of symptoms with weather or environmental changes or other aggravating or alleviating factors except as outlined above   Current Medications, Allergies, Complete Past Medical History, Past Surgical History, Family History, and Social History were reviewed in [employer] record.  ROS  The following are not active complaints unless bolded sore throat, dysphagia, dental problems, itching, sneezing,  nasal congestion or excess/ purulent secretions, ear ache,   fever, chills, sweats, unintended wt loss, classically pleuritic or exertional cp,  orthopnea pnd or leg swelling better , presyncope, palpitations, abdominal pain, anorexia, nausea, vomiting, diarrhea  or change in bowel or bladder habits, change in stools or urine, dysuria,hematuria,  rash, arthralgias, visual complaints, headache, numbness, weakness or ataxia or problems with walking or coordination,  change in mood/affect or memory.                       Objective:   Physical Exam  Thin  amb anxious  wf nad  07/29/2012  105  >102 09/24/2012 > 10/26/2012  99 > 12/10/2012 99 > 104 03/14/2013 > 06/20/2013 107 > 109 07/28/2013 > 11/09/2013  109 > 11/10/2014   113 > 03/13/2015  113 > 04/10/2015   113 > 07/10/2015  114 > 11/08/2015   103 > 01/22/2016 96 > 05/19/2016  89      Vital signs reviewed   -  - Note on arrival 02 sats  95% on RA    HEENT mild turbinate edema.  Oropharynx with dryness noted, post pharynx with few white scattered patches c/w thrush. .  No JVD or cervical adenopathy. Mild accessory muscle hypertrophy. Trachea midline, nl thryroid. Chest was hyperinflated by percussion with diminished breath sounds and moderate increased exp time without wheeze. Hoover sign positive at mid inspiration. Regular rate and rhythm without murmur gallop or rub or increase P2 -  No LE pitting edema    Abd: no hsm, nl excursion. Ext warm without cyanosis or  clubbing.                Assessment & Plan:

## 2016-05-19 NOTE — Patient Instructions (Addendum)
Plan A = Automatic = Symbicort 160  Take 2 puffs first thing in am and then another 2 puffs about 12 hours later.    Plan B = Backup Only use your albuterol as a rescue medication to be used if you can't catch your breath by resting or doing a relaxed purse lip breathing pattern.  - The less you use it, the better it will work when you need it. - Ok to use the inhaler up to 2 puffs  every 4 hours if you must but call for appointment if use goes up over your usual need - Don't leave home without it !!  (think of it like the spare tire for your car)    Please schedule a follow up visit in 6  months but call sooner if needed

## 2016-05-25 NOTE — Assessment & Plan Note (Signed)
-   PFT's 07/31/09 FEV1  1.36 (74%) ratio 45 and no better p B2,  DLCO 66%  - quit smoking June 2013 - PFT's 07/29/2012 FEV1 1.10 (63%) ratio 46 and no change p B2 and DLCO 57%  - 10/26/2012  Walked RA x 3 laps @ 185 ft each stopped due to end of study, no desats - Trial off spiriva 03/14/2013 > rechallenged 06/20/12 due to worse doe > d/cd around 06/26/13 and no worse off it  - 03/13/2015 trained on respimat > 90% effecitve > changed to stiolto 2 pffs each am > preferred symbicort so 04/10/2015 rec bevespi trial  - 04/10/2015  Walked RA x 3 laps @ 185 ft each stopped due to  End of study, nl pace, no sob or desat/ did c/o leg fatigue bilaterally  - 07/10/2015    try spiriva respimat 2 each am > unable to tolerate mouth effects so d/c'd 08/2015   - 05/19/2016  After extensive coaching HFA effectiveness =    90%   She's actually self managing pretty well at present and just needs to remember to use her rescue inhaler appropriately that is up to 2 puffs every 4 hours for any breakthrough symptoms.  Each maintenance medication was reviewed in detail including most importantly the difference between maintenance and as needed and under what circumstances the prns are to be used.  Please see AVS for specific  Instructions which are unique to this visit and I personally typed out  which were reviewed in detail in writing with the patient and a copy provided.

## 2016-05-26 ENCOUNTER — Telehealth (INDEPENDENT_AMBULATORY_CARE_PROVIDER_SITE_OTHER): Payer: Self-pay | Admitting: Physical Medicine and Rehabilitation

## 2016-05-26 NOTE — Telephone Encounter (Signed)
x1 then would need OV

## 2016-06-09 ENCOUNTER — Ambulatory Visit (INDEPENDENT_AMBULATORY_CARE_PROVIDER_SITE_OTHER): Payer: Self-pay

## 2016-06-09 ENCOUNTER — Encounter (INDEPENDENT_AMBULATORY_CARE_PROVIDER_SITE_OTHER): Payer: Self-pay | Admitting: Physical Medicine and Rehabilitation

## 2016-06-09 ENCOUNTER — Ambulatory Visit (INDEPENDENT_AMBULATORY_CARE_PROVIDER_SITE_OTHER): Payer: Medicare Other | Admitting: Physical Medicine and Rehabilitation

## 2016-06-09 VITALS — BP 107/62 | HR 92 | Temp 97.5°F

## 2016-06-09 DIAGNOSIS — M5412 Radiculopathy, cervical region: Secondary | ICD-10-CM | POA: Diagnosis not present

## 2016-06-09 MED ORDER — LIDOCAINE HCL (PF) 1 % IJ SOLN
0.3300 mL | Freq: Once | INTRAMUSCULAR | Status: AC
  Administered 2016-06-09: 0.3 mL

## 2016-06-09 MED ORDER — METHYLPREDNISOLONE ACETATE 80 MG/ML IJ SUSP
80.0000 mg | Freq: Once | INTRAMUSCULAR | Status: AC
Start: 2016-06-09 — End: 2016-06-09
  Administered 2016-06-09: 80 mg

## 2016-06-09 NOTE — Procedures (Signed)
Cervical Epidural Steroid Injection - Interlaminar Approach with Fluoroscopic Guidance  Patient: Amber Snow      Date of Birth: March 03, 1933 MRN: [id number] PCP: Walker Kehr, MD      Visit Date: 06/09/2016   Universal Protocol:    Date/Time: 02/12/181:20 PM  Consent Given By: the patient  Position: PRONE  Additional Comments: Vital signs were monitored before and after the procedure. Patient was prepped and draped in the usual sterile fashion. The correct patient, procedure, and site was verified.   Injection Procedure Details:  Procedure Site One Meds Administered:  Meds ordered this encounter  Medications  . lidocaine (PF) (XYLOCAINE) 1 % injection 0.3 mL  . methylPREDNISolone acetate (DEPO-MEDROL) injection 80 mg     Laterality: Left  Location/Site:  C7-T1  Needle size: 22 G  Needle type: Touhy  Needle Placement: Paramedian epidural space  Findings:  -Contrast Used: 1 mL iohexol 180 mg iodine/mL   -Comments: Excellent flow of contrast into the epidural space.  Procedure Details: Using a paramedian approach from the side mentioned above, the region overlying the inferior lamina was localized under fluoroscopic visualization and the soft tissues overlying this structure were infiltrated with 4 ml. of 1% Lidocaine without Epinephrine. A # 20 gauge, Tuohy needle was inserted into the epidural space using a paramedian approach.  The epidural space was localized using loss of resistance along with lateral and contralateral oblique bi-planar fluoroscopic views.  After negative aspirate for air, blood, and CSF, a 2 ml. volume of Isovue-250 was injected into the epidural space and the flow of contrast was observed. Radiographs were obtained for documentation purposes.   The injectate was administered into the level noted above.  Additional Comments:  The patient tolerated the procedure well Dressing: Band-Aid    Post-procedure details: Patient was observed  during the procedure. Post-procedure instructions were reviewed.  Patient left the clinic in stable condition.

## 2016-06-09 NOTE — Patient Instructions (Signed)

## 2016-06-09 NOTE — Progress Notes (Signed)
Amber Snow - 83 y.o. female MRN [id number]  Date of birth: March 08, 1933  Office Visit Note: Visit Date: 06/09/2016 PCP: Walker Kehr, MD Referred by: Cassandria Anger, MD  Subjective: Chief Complaint  Patient presents with  . Neck - Pain   HPI: Amber Snow is an 83 year old female with known disc extrusion at T1-2 as well as multilevel facet arthropathy and myofascial pain syndrome. She comes in today reporting several weeks of neck and bilateral shoulder pain. Changes from left to right which one bothers her more. Worse on the right side today. Increased pain in the last 2 weeks. She denies any new trauma. She has had some issues with bronchitis and does have a cough. She also has ongoing bladder issues with bladder cancer and has had a procedure in December for that. Unfortunately she also just appears more frail to me every time I see her.    ROS Otherwise per HPI.  Assessment & Plan: Visit Diagnoses:  1. Cervical radiculopathy     Plan: Findings:  We are going to repeat the cervical epidural injection today this be a C7-T1. Hopefully this disc will resorb over time but I think right now it is more disc related. Depending on the relief she gets we could look at trigger point injections as well.    Meds & Orders:  Meds ordered this encounter  Medications  . lidocaine (PF) (XYLOCAINE) 1 % injection 0.3 mL  . methylPREDNISolone acetate (DEPO-MEDROL) injection 80 mg    Orders Placed This Encounter  Procedures  . XR C-ARM NO REPORT  . Epidural Steroid injection    Follow-up: Return if symptoms worsen or fail to improve, after 2 weeks.   Procedures: No procedures performed  Cervical Epidural Steroid Injection - Interlaminar Approach with Fluoroscopic Guidance  Patient: Amber Snow      Date of Birth: [birth year] MRN: [id number] PCP: Walker Kehr, MD      Visit Date: 06/09/2016   Universal Protocol:    Date/Time: 02/12/181:20 PM  Consent Given By: the  patient  Position: PRONE  Additional Comments: Vital signs were monitored before and after the procedure. Patient was prepped and draped in the usual sterile fashion. The correct patient, procedure, and site was verified.   Injection Procedure Details:  Procedure Site One Meds Administered:  Meds ordered this encounter  Medications  . lidocaine (PF) (XYLOCAINE) 1 % injection 0.3 mL  . methylPREDNISolone acetate (DEPO-MEDROL) injection 80 mg     Laterality: Left  Location/Site:  C7-T1  Needle size: 22 G  Needle type: Touhy  Needle Placement: Paramedian epidural space  Findings:  -Contrast Used: 1 mL iohexol 180 mg iodine/mL   -Comments: Excellent flow of contrast into the epidural space.  Procedure Details: Using a paramedian approach from the side mentioned above, the region overlying the inferior lamina was localized under fluoroscopic visualization and the soft tissues overlying this structure were infiltrated with 4 ml. of 1% Lidocaine without Epinephrine. A # 20 gauge, Tuohy needle was inserted into the epidural space using a paramedian approach.  The epidural space was localized using loss of resistance along with lateral and contralateral oblique bi-planar fluoroscopic views.  After negative aspirate for air, blood, and CSF, a 2 ml. volume of Isovue-250 was injected into the epidural space and the flow of contrast was observed. Radiographs were obtained for documentation purposes.   The injectate was administered into the level noted above.  Additional Comments:  The patient tolerated the procedure well Dressing:  Band-Aid    Post-procedure details: Patient was observed during the procedure. Post-procedure instructions were reviewed.  Patient left the clinic in stable condition.     Clinical History: No specialty comments available.  She reports that she quit smoking about 4 years ago. Her smoking use included Cigarettes. She has a 50.00 pack-year smoking  history. She has never used smokeless tobacco. No results for input(s): HGBA1C, LABURIC in the last 8760 hours.  Objective:  VS:  HT:    WT:   BMI:     BP:107/62  HR:92bpm  TEMP:97.5 F (36.4 C)(Oral)  RESP:97 % Physical Exam  Musculoskeletal:  Cervical range of motion is limited in ranges with extension and rotation. She does have a forward flexed cervical spine. She has good strength of the hands bilaterally without any atrophy.    Ortho Exam Imaging: Xr C-arm No Report  Result Date: 06/09/2016 Please see Notes or Procedures tab for imaging impression.   Past Medical/Family/Surgical/Social History: Medications & Allergies reviewed per EMR Patient Active Problem List   Diagnosis Date Noted  . Cervical disc disorder with radiculopathy 12/15/2015  . Cervical spondylosis with myelopathy 12/15/2015  . COPD exacerbation ([place]) 11/10/2015  . Essential hypertension 11/10/2015  . Oral candidiasis 08/09/2015  . Weakness 02/21/2015  . Edema 11/08/2014  . Acute bronchitis 07/17/2014  . Cellulitis of leg, left 12/28/2013  . Laceration of leg 12/15/2013  . CAD (coronary artery disease) 09/02/2013  . Well adult exam 07/06/2013  . Dizzy 05/10/2013  . Chronic systolic CHF (congestive heart failure) ([place]) 12/30/2012  . Sepsis ([place]) 09/16/2012  . CAP (community acquired pneumonia) 09/16/2012  . Nonischemic cardiomyopathy ([place]) 06/02/2012  . Lung nodule 06/01/2012  . Bladder cancer ([place]) 12/17/2011  . LV dysfunction 09/26/2011  . Dyspnea 08/13/2011  . PAD (peripheral artery disease) ([place]) 08/13/2011  . Murmur 08/13/2011  . Other dysphagia 09/09/2010  . LOW BACK PAIN, CHRONIC 10/17/2009  . Low back pain radiating to both legs 10/17/2009  . THYROID FUNCTION TEST, ABNORMAL 07/19/2009  . LEUKOCYTOSIS 07/02/2009  . Nonspecific (abnormal) findings on radiological and other examination of body structure 07/02/2009  . Fatigue 06/29/2009  . Vitamin D deficiency 11/20/2008  . ARTHRALGIA  11/20/2008  . UNSPECIFIED MYALGIA AND MYOSITIS 11/20/2008  . OTH SYMPTOMS INVLV NERV&MUSCULOSKELETAL SYSTEMS 03/15/2008  . LBBB (left bundle branch block) 02/14/2008  . OSTEOPENIA 11/12/2007  . RENAL CALCULUS 08/16/2007  . DIVERTICULOSIS, COLON 12/25/2006  . HYPERLIPIDEMIA 10/02/2006  . COPD GOLD II  10/02/2006   Past Medical History:  Diagnosis Date  . Acute exacerbation of chronic obstructive pulmonary disease (COPD) (HCC)    ON PREDNISONE INCREASED DOSE  FOR LAST WEEK  . Arthritis HANDS  . Bladder calculus   . Bladder cancer ([place])    Chemo last 10'17. -[facility]  . CAD (coronary artery disease) CARDIOLOGSIT-- DR Martinique   05-17-2013minimal nonobstructive CAD per cath with EF of 35 to 40% and normal filling pressures and right heart pressures-Dr. Martinique follows  . Chronic systolic CHF (congestive heart failure) ([place])   . COPD, moderate ([place]) PULMOLOGIST-- DR Melvyn Novas   W/ EMPHYSEMA  (COPD  GOLD II)- Dr. Melvyn Novas follows  . Diverticulosis of colon   . Dyspnea    with exertion only- intermittent cough  . Dyspnea on exertion   . Hearing impaired person, bilateral    wears hearing aids bilateral  . History of nephrolithiasis   . Hyperlipidemia   . LBBB (left bundle branch block)    CHRONIC  . Myalgia   .  Neck pain    07-30-15 at present and flares periodically"? stenosis"  . Nocturia   . Nodule of right lung    APICAL 1.0CM (MONITORED BY DR Melvyn Novas)  . Nonischemic cardiomyopathy ([place])   . Osteopenia   . PAC (premature atrial contraction)   . Pneumonia 1-2 YEARS AGO  . Renal calculus, bilateral   . Wears hearing aid    BILATERAL   Family History  Problem Relation Age of Onset  . Heart disease Mother   . Hypertension Mother   . Hypertension Father   . Heart attack Father   . Heart disease Father   . Thyroid disease Sister   . Heart attack Sister   . Stroke Sister   . COPD Sister   . Heart attack Brother   . Cancer Maternal Grandmother     breast cancer  . Diabetes Paternal  Grandmother   . Asthma Neg Hx    Past Surgical History:  Procedure Laterality Date  . CARDIAC CATHETERIZATION  09-12-2011  DR Martinique   MINIMAL NONOBSTRUCTIVE CAD/ MODERATE TO SEVERE LV DYSFUNCTION/  NORMAL RIGHT HEART PRESSURES AND LV FILLING PRESSURES  . CATARACT EXTRACTION W/ INTRAOCULAR LENS  IMPLANT, BILATERAL  1998  . COSMETIC SURGERY     eyelids  . CYSTO/ LEFT RETROGRADE PYELOGRAM/ LEFT URETERAL STENT PLACEMENT  10-16-2001   STONE  . CYSTOSCOPY  07/01/2011   Procedure: CYSTOSCOPY;  Surgeon: Hanley Ben, MD;  Location: [facility];  Service: Urology;  Laterality: N/A;  retrieval l of bladder stone  . CYSTOSCOPY N/A 08/20/2012   Procedure: CYSTOSCOPY;  Surgeon: Hanley Ben, MD;  Location: [facility];  Service: Urology;  Laterality: N/A;  fulgeration of bladderm tumors  . CYSTOSCOPY N/A 06/23/2014   Procedure: CYSTOSCOPY;  Surgeon: Arvil Persons, MD;  Location: [facility];  Service: Urology;  Laterality: N/A;  . CYSTOSCOPY W/ RETROGRADES Right 04/01/2013   Procedure: CYSTOSCOPY WITH RETROGRADE PYELOGRAM;  Surgeon: Hanley Ben, MD;  Location: [facility];  Service: Urology;  Laterality: Right;  . CYSTOSCOPY WITH BIOPSY N/A 02/06/2015   Procedure: CYSTO WITH BLADDER BIOPSY;  Surgeon: Festus Aloe, MD;  Location: [facility];  Service: Urology;  Laterality: N/A;  . CYSTOSCOPY WITH BIOPSY N/A 04/04/2016   Procedure: CYSTOSCOPY WITH BIOPSY AND FULGURATION EPIRUBICIN BLADDER INSTILLATION;  Surgeon: Festus Aloe, MD;  Location: [facility];  Service: Urology;  Laterality: N/A;  . CYSTOSCOPY WITH FULGERATION N/A 11/27/2015   Procedure: CYSTOSCOPY WITH FULGERATION BLADDER BIOPSY ;  Surgeon: Festus Aloe, MD;  Location: [facility];  Service: Urology;  Laterality: N/A;  . CYSTOSCOPY WITH LITHOLAPAXY N/A 04/01/2013   Procedure: CYSTOSCOPY WITH HOLMIUM LASER OF  BLADDER CALCULUS;  Surgeon: Hanley Ben, MD;  Location: [facility];  Service: Urology;  Laterality: N/A;  . CYSTOSCOPY WITH RETROGRADE PYELOGRAM, URETEROSCOPY AND STENT PLACEMENT Bilateral 07/31/2015   Procedure: CYSTOSCOPY WITH FULGERATION BLADDER BIOPSY, CYSTOSCOPY WITH BILATERAL RETROGRADE PYELOGRAMS;  Surgeon: Festus Aloe, MD;  Location: [facility];  Service: Urology;  Laterality: Bilateral;  . EXTRACORPOREAL SHOCK WAVE LITHOTRIPSY  2003  . FULGURATION OF BLADDER TUMOR  06/23/2014   Procedure: FULGURATION OF BLADDER TUMOR;  Surgeon: Arvil Persons, MD;  Location: [facility];  Service: Urology;;  . FULGURATION OF BLADDER TUMOR N/A 02/06/2015   Procedure: FULGURATION OF BLADDER TUMORS;  Surgeon: Festus Aloe, MD;  Location[facility];  Service: Urology;  Laterality: N/A;  . HOLMIUM LASER APPLICATION N/A [id number]   Procedure:  HOLMIUM LASER APPLICATION;  Surgeon: Hanley Ben, MD;  Location: [facility];  Service: Urology;  Laterality: N/A;  . LEFT AND RIGHT HEART CATHETERIZATION WITH CORONARY ANGIOGRAM Bilateral 09/12/2011   Procedure: LEFT AND RIGHT HEART CATHETERIZATION WITH CORONARY ANGIOGRAM;  Surgeon: Peter M Martinique, MD;  Location[facility];  Service: Cardiovascular;  Laterality: Bilateral;  . LEFT LONG FINGER ARTHROPLASTY & PULLEY RELASE  02-17-2005  . ORIF FINGER / THUMB FRACTURE    . PULMONARY FUNCTION ANALYSIS  07-31-2009   MODERATE TO SEVERE OBSTRUCTIVE AIRWAY DISEASE/ INSIGNIFICANT RESPONSE TO BRONCHODILATORS/ EMPHYSEMA PATTERN/ MILD DECREASE IN DIFFUSE CAPACITY FEF 25-75 CHANGED BY 8%  . TONSILLECTOMY  CHILD  . TOOTH EXTRACTION  01-17-15   LOWER TOOTH CROWN  BROKE OFF AND WAS EXTRACTED  . TRANSTHORACIC ECHOCARDIOGRAM  last one 03-15-2014  DR Martinique   GRADE I DIASTOLIC DYSFUNCTION/  confirmed by dr Liane Comber  EF 45% (down from 60-65% 03-12-2013 but up from 25-30% compared to last echo 10-01-2012 /  NORMAL LV SIZE AND WALL THICKNESS/  trivial TR  . TRANSURETHRAL RESECTION OF BLADDER TUMOR  07/01/2011    Procedure: TRANSURETHRAL RESECTION OF BLADDER TUMOR (TURBT);  Surgeon: Hanley Ben, MD;  Location: [facility];  Service: Urology;  Laterality: N/A;  CYSTO  . TRANSURETHRAL RESECTION OF BLADDER TUMOR N/A 02/06/2015   Procedure:  TRANSURETHRAL RESECTION OF BLADDER TUMOR (TURBT) GREATER THAN 5 CM; RIGHT RETROGRADE, RIGHT STENT PLACEMENT;  Surgeon: Festus Aloe, MD;  Location[facility];  Service: Urology;  Laterality: N/A;  . TRANSURETHRAL RESECTION OF BLADDER TUMOR N/A 07/31/2015   Procedure:  TRANSURETHRAL RESECTION OF BLADDER TUMOR (TURBT); REMOVAL OF DYSTROPHIC CALCIFICATIONS;  Surgeon: Festus Aloe, MD;  Location[facility];  Service: Urology;  Laterality: N/A;  . TRANSURETHRAL RESECTION OF BLADDER TUMOR N/A 11/27/2015   Procedure: TRANSURETHRAL RESECTION OF BLADDER TUMOR (TURBT);  Surgeon: Festus Aloe, MD;  Location[facility];  Service: Urology;  Laterality: N/A;  . TUBAL LIGATION  AGE 30'S   Social History   Occupational History  . Retired     Government social research officer Asst   Social History Main Topics  . Smoking status: Former Smoker    Packs/day: 1.00    Years: 50.00    Types: Cigarettes    Quit date: 09/27/2011  . Smokeless tobacco: Never Used  . Alcohol use Yes     Comment: wine rarely  . Drug use: No  . Sexual activity: Not on file

## 2016-06-13 ENCOUNTER — Ambulatory Visit: Payer: Medicare Other | Admitting: Internal Medicine

## 2016-06-26 ENCOUNTER — Encounter: Payer: Self-pay | Admitting: Internal Medicine

## 2016-06-26 ENCOUNTER — Ambulatory Visit (INDEPENDENT_AMBULATORY_CARE_PROVIDER_SITE_OTHER): Payer: Medicare Other | Admitting: Internal Medicine

## 2016-06-26 DIAGNOSIS — J449 Chronic obstructive pulmonary disease, unspecified: Secondary | ICD-10-CM

## 2016-06-26 DIAGNOSIS — I739 Peripheral vascular disease, unspecified: Secondary | ICD-10-CM | POA: Diagnosis not present

## 2016-06-26 DIAGNOSIS — I5022 Chronic systolic (congestive) heart failure: Secondary | ICD-10-CM | POA: Diagnosis not present

## 2016-06-26 DIAGNOSIS — I10 Essential (primary) hypertension: Secondary | ICD-10-CM | POA: Diagnosis not present

## 2016-06-26 DIAGNOSIS — R634 Abnormal weight loss: Secondary | ICD-10-CM | POA: Insufficient documentation

## 2016-06-26 MED ORDER — BUDESONIDE-FORMOTEROL FUMARATE 160-4.5 MCG/ACT IN AERO: 2.0000 | INHALATION_SPRAY | Freq: Two times a day (BID) | RESPIRATORY_TRACT | 11 refills | Status: DC

## 2016-06-26 NOTE — Progress Notes (Signed)
Pre visit review using our clinic review tool, if applicable. No additional management support is needed unless otherwise documented below in the visit note. 

## 2016-06-26 NOTE — Assessment & Plan Note (Signed)
Amber Snow

## 2016-06-26 NOTE — Assessment & Plan Note (Signed)
Symbicort 

## 2016-06-26 NOTE — Progress Notes (Signed)
Subjective:  Patient ID: Amber Snow, female    DOB: 31-May-1932  Age: 83 y.o. MRN: [id number]  CC: No chief complaint on file.   HPI Amber Snow presents for COPD, anxiety, fatigue, HTN f/u    Outpatient Medications Prior to Visit  Medication Sig Dispense Refill  . acetaminophen (TYLENOL) 500 MG tablet Take 1,000 mg by mouth every 6 (six) hours as needed for moderate pain or headache.    . ALPRAZolam (XANAX) 0.25 MG tablet TAKE 1/2 TO 1 TABLET BY MOUTH TWICE A DAY AS NEEDED FOR ANXIETY (SHORTNESS OF BREATH) 60 tablet 2  . baclofen (LIORESAL) 10 MG tablet Take 10 mg by mouth 3 (three) times daily as needed for muscle spasms.     Marland Kitchen BIOTIN PO Take 1 capsule by mouth at bedtime.     . Cholecalciferol (VITAMIN D) 1000 UNITS capsule Take 1,000 Units by mouth at bedtime.     . famotidine (PEPCID) 20 MG tablet Take 20 mg by mouth 2 (two) times daily.    Marland Kitchen ibuprofen (ADVIL,MOTRIN) 200 MG tablet Take 600-800 mg by mouth every 6 (six) hours as needed for moderate pain.    Marland Kitchen lidocaine (LIDODERM) 5 % Place 1 patch onto the skin daily as needed (NECK PAIN). Remove & Discard patch within 12 hours or as directed by MD    . losartan-hydrochlorothiazide (HYZAAR) 50-12.5 MG tablet Take 1 tablet by mouth daily. (Patient taking differently: Take 1 tablet by mouth at bedtime. ) 90 tablet 3  . PROAIR HFA 108 (90 Base) MCG/ACT inhaler INHALE 1-2 PUFFS INTO THE LUNGS EVERY 4 (FOUR) HOURS AS NEEDED FOR WHEEZING OR SHORTNESS OF BREATH. 8.5 Inhaler 2  . traZODone (DESYREL) 50 MG tablet TAKE 1 TABLET BY MOUTH EVERY DAY 90 tablet 2  . zoledronic acid (RECLAST) 5 MG/100ML SOLN Inject 5 mg into the vein once. Every year    . budesonide-formoterol (SYMBICORT) 160-4.5 MCG/ACT inhaler Inhale 2 puffs into the lungs 2 (two) times daily. 1 Inhaler 0  . MONUROL 3 g PACK as directed.  0  . nitrofurantoin (MACRODANTIN) 50 MG capsule Take 1 capsule (50 mg total) by mouth at bedtime. (Patient not taking: Reported on  06/26/2016) 7 capsule 0   No facility-administered medications prior to visit.     ROS Review of Systems  Constitutional: Positive for fatigue. Negative for activity change, appetite change, chills and unexpected weight change.  HENT: Negative for congestion, mouth sores and sinus pressure.   Eyes: Negative for visual disturbance.  Respiratory: Positive for shortness of breath. Negative for cough and chest tightness.   Gastrointestinal: Negative for abdominal pain and nausea.  Genitourinary: Negative for difficulty urinating, frequency and vaginal pain.  Musculoskeletal: Negative for back pain and gait problem.  Skin: Negative for pallor and rash.  Neurological: Negative for dizziness, tremors, weakness, numbness and headaches.  Psychiatric/Behavioral: Negative for confusion and sleep disturbance. The patient is nervous/anxious.     Objective:  BP 130/60 (BP Location: Left Arm, Patient Position: Sitting, Cuff Size: Normal)   Pulse 92   Temp 97.8 F (36.6 C) (Oral)   Ht 5\' 3"  (1.6 m)   Wt 88 lb 1.9 oz (40 kg)   SpO2 95%   BMI 15.61 kg/m   BP Readings from Last 3 Encounters:  06/26/16 130/60  06/09/16 107/62  05/19/16 120/70    Wt Readings from Last 3 Encounters:  06/26/16 88 lb 1.9 oz (40 kg)  05/19/16 89 lb (40.4 kg)  04/04/16 90  lb (40.8 kg)    Physical Exam  Constitutional: She appears well-developed. No distress.  HENT:  Head: Normocephalic.  Right Ear: External ear normal.  Left Ear: External ear normal.  Nose: Nose normal.  Mouth/Throat: Oropharynx is clear and moist.  Eyes: Conjunctivae are normal. Pupils are equal, round, and reactive to light. Right eye exhibits no discharge. Left eye exhibits no discharge.  Neck: Normal range of motion. Neck supple. No JVD present. No tracheal deviation present. No thyromegaly present.  Cardiovascular: Normal rate, regular rhythm and normal heart sounds.   Pulmonary/Chest: No stridor. No respiratory distress. She has no  wheezes.  Abdominal: Soft. Bowel sounds are normal. She exhibits no distension and no mass. There is no tenderness. There is no rebound and no guarding.  Musculoskeletal: She exhibits no edema or tenderness.  Lymphadenopathy:    She has no cervical adenopathy.  Neurological: She displays normal reflexes. No cranial nerve deficit. She exhibits normal muscle tone. Coordination normal.  Skin: No rash noted. No erythema.  Psychiatric: She has a normal mood and affect. Her behavior is normal. Judgment and thought content normal.  thin  Lab Results  Component Value Date   WBC 13.0 (H) 04/01/2016   HGB 12.9 04/01/2016   HCT 38.8 04/01/2016   PLT 302 04/01/2016   GLUCOSE 118 (H) 04/01/2016   CHOL 221 (H) 07/06/2013   TRIG 200.0 (H) 07/06/2013   HDL 60.90 07/06/2013   LDLDIRECT 126.5 06/14/2012   LDLCALC 120 (H) 07/06/2013   ALT 15 01/11/2016   AST 22 01/11/2016   NA 139 04/01/2016   K 4.3 04/01/2016   CL 101 04/01/2016   CREATININE 0.95 04/01/2016   BUN 30 (H) 04/01/2016   CO2 28 04/01/2016   TSH 1.21 11/08/2014   INR 1.0 09/04/2011   HGBA1C 5.8 (H) 06/04/2012    Dg Bone Density  Result Date: 04/17/2016 EXAM: DUAL X-RAY ABSORPTIOMETRY (DXA) FOR BONE MINERAL DENSITY IMPRESSION: Referring Physician:  Aloha Snow PATIENT: Name: Amber Snow A Patient ID: [id number] Birth Date: Mar 23, 1933 Height: 63.0 in. Sex: Female Measured: 04/17/2016 Weight: 86.0 lbs. Indications: Advanced Age, Caucasian, Estrogen Deficient, History of Osteopenia, Low Body Weight (783.22), Low Calcium Intake (269.3), Postmenopausal Fractures: None Treatments: Reclast, Vitamin D (E933.5) ASSESSMENT: The BMD measured at Forearm Radius 33% is 0.644 g/cm2 with a T-score of -2.7. This patient is considered osteoporotic according to [facility]) criteria. Lumbar spine was not utilized due to advanced degenerative changes. There has been a statistically significant decrease in BMD of Left hip since  prior exam dated 03/29/2014. Site Region Measured Date Measured Age YA BMD Significant CHANGE T-score Left Forearm Radius 33% 04/17/2016 83.2 -2.7 0.644 g/cm2 DualFemur Neck Right 04/17/2016 81.2 -2.3 0.719 g/cm2 World Health Organization [facility]) criteria for post-menopausal, Caucasian Women: Normal       T-score at or above -1 SD Osteopenia   T-score between -1 and -2.5 SD Osteoporosis T-score at or below -2.5 SD RECOMMENDATION: [place] recommends that FDA-approved medical therapies be considered in postmenopausal women and men age 68 or older with a: 1. Hip or vertebral (clinical or morphometric) fracture. 2. T-score of <-2.5 at the spine or hip. 3. Ten-year fracture probability by FRAX of 3% or greater for hip fracture or 20% or greater for major osteoporotic fracture. All treatment decisions require clinical judgment and consideration of individual patient factors, including patient preferences, co-morbidities, previous drug use, risk factors not captured in the FRAX model (e.g. falls, vitamin D deficiency, increased bone  turnover, interval significant decline in bone density) and possible under - or over-estimation of fracture risk by FRAX. All patients should ensure an adequate intake of dietary calcium (1200 mg/d) and vitamin D (800 IU daily) unless contraindicated. FOLLOW-UP: People with diagnosed cases of osteoporosis or at high risk for fracture should have regular bone mineral density tests. For patients eligible for Medicare, routine testing is allowed once every 2 years. The testing frequency can be increased to one year for patients who have rapidly progressing disease, those who are receiving or discontinuing medical therapy to restore bone mass, or have additional risk factors. I have reviewed this report, and agree with the above findings. [facility] Electronically Signed   By: Lahoma Crocker M.D.   On: 04/17/2016 11:25    Assessment & Plan:   There are no diagnoses  linked to this encounter. I am having Ms. Starks maintain her Vitamin D, BIOTIN PO, zoledronic acid, baclofen, ibuprofen, famotidine, lidocaine, ALPRAZolam, losartan-hydrochlorothiazide, budesonide-formoterol, PROAIR HFA, acetaminophen, nitrofurantoin, traZODone, and MONUROL.  No orders of the defined types were placed in this encounter.    Follow-up: No Follow-up on file.  Walker Kehr, MD

## 2016-06-26 NOTE — Assessment & Plan Note (Signed)
Stain intolerant

## 2016-06-26 NOTE — Assessment & Plan Note (Signed)
Losartan 

## 2016-06-26 NOTE — Assessment & Plan Note (Signed)
Monitoring Wt Readings from Last 3 Encounters:  06/26/16 88 lb 1.9 oz (40 kg)  05/19/16 89 lb (40.4 kg)  04/04/16 90 lb (40.8 kg)

## 2016-06-30 ENCOUNTER — Telehealth: Payer: Self-pay | Admitting: Internal Medicine

## 2016-06-30 NOTE — Telephone Encounter (Signed)
Spoke with Magda Paganini and MW approved for patient to be seen 07-01-16 at 8:30am. Pt is aware and nothing more needed at this time.

## 2016-07-01 ENCOUNTER — Encounter: Payer: Self-pay | Admitting: Internal Medicine

## 2016-07-01 ENCOUNTER — Ambulatory Visit (INDEPENDENT_AMBULATORY_CARE_PROVIDER_SITE_OTHER)
Admission: RE | Admit: 2016-07-01 | Discharge: 2016-07-01 | Disposition: A | Discharge status: Home / Self Care | Payer: Medicare Other | Source: Ambulatory Visit | Attending: Internal Medicine | Admitting: Internal Medicine

## 2016-07-01 ENCOUNTER — Ambulatory Visit (INDEPENDENT_AMBULATORY_CARE_PROVIDER_SITE_OTHER): Payer: Medicare Other | Admitting: Internal Medicine

## 2016-07-01 ENCOUNTER — Telehealth: Payer: Self-pay | Admitting: Internal Medicine

## 2016-07-01 VITALS — BP 104/60 | HR 90 | Ht 63.0 in | Wt 88.0 lb

## 2016-07-01 DIAGNOSIS — J441 Chronic obstructive pulmonary disease with (acute) exacerbation: Secondary | ICD-10-CM | POA: Diagnosis not present

## 2016-07-01 DIAGNOSIS — J449 Chronic obstructive pulmonary disease, unspecified: Secondary | ICD-10-CM | POA: Diagnosis not present

## 2016-07-01 DIAGNOSIS — J690 Pneumonitis due to inhalation of food and vomit: Secondary | ICD-10-CM

## 2016-07-01 MED ORDER — AZITHROMYCIN 250 MG PO TABS: ORAL_TABLET | ORAL | 0 refills | Status: AC

## 2016-07-01 MED ORDER — PREDNISONE 10 MG PO TABS: ORAL_TABLET | ORAL | 0 refills | Status: DC

## 2016-07-01 NOTE — Progress Notes (Signed)
Subjective:   Patient ID: Amber Snow, female    DOB: [birth year]  MRN: [id number]   Brief patient profile:  52  yowf with GOLD II copd by pfts 07/2009 quit smoking June 2013  due to progressive doe x 5 years worse since quit referred to pulmonary clinic 06/17/2012 by Amber Snow    History of Present Illness  07/10/2015  f/u ov/Amber Snow re: GOLD II/ could not afford bevespi and not convinced better than symbicort   Chief Complaint  Patient presents with  . Follow-up    Increased SOB x 2 wks. She has not felt well in general. She also c/o cough- mainly non prod. She is using proair once daily on average.   onset gradually worse x 2 weeks with any activity >  ? If better with albuterol ? Better with prednisone and feels worse off it in terms of energy but not cough/sob which is worse with use of arms. >>cont on Symbicort and Spiriva   08/09/2015 Follow up : Amber Snow/COPD GOLD II  Pt returns for 1 month follow up and med review  Did not bring her meds today. She was upset that she is not seeing Amber Snow  Today.  Offered to reschedule x 2 but pt declined.  Complains of that her mouth is very sore.  Says can not tolerate Spiriva . Dries her out way too much , causes her tongue to be too sore.  Is taking Symbicort Twice daily  , says she rinses well after use.  Complains of 1 week of increased cough, congestion with green mucus. More sob than usual. No wheezing , chest pain, hemoptysis or fever.  Followed by Urology for bladder cancer . Had cystoscopy 07/31/15  Rec Doxycycline 100 mg twice daily for 1 week, take with food. Use sunscreen. If outside as this antibiotic may cause sunburn Mucinex DM twice daily as needed for cough and congestion. Mycelex lozenges 5 times daily for 1 week Continue on Symbicort 2 puffs twice daily, rinse after each use   11/08/2015  f/u ov/Amber Snow re: GOLD II copd/ symbicort 160 2bid / no med calendar  Chief Complaint  Patient presents with  . Follow-up   Breathing is some worse- relates to humid weather. Cough is prod with white sputum. She has noticed increased wheezing. She is using albuterol once per wk on average.   sleeping ok/ note aecopd @ last ov and responded back to baseline  Ok walking indoors = MMRC2 = can't walk a nl pace on a flat grade s sob but does fine slow and flat eg [employer] but not mall  C/o swelling in legs/ dependent daytime despite cozaar as her bp med and using lasix 20 mg prn  rec Discuss with Amber Snow add hctz to your cozar  No change in pulmonary medications     01/22/2016  f/u ov/Amber Snow re: GOLD II  copd/ symbicort 160 2bid  Chief Complaint  Patient presents with  . Follow-up    c/o sob worse 3-4 days ago,was using Albuterol 1x day, little better now,cough-thick white,chest tightness,denies cp,  no change ex tol Never took more than one proair daily and improved since flare of cough  Doe still  = mmrc 2  rec Plan A = Automatic = Symbicort 160 Take 2 puffs first thing in am and then another 2 puffs about 12 hours later.  Plan B = Backup Only use your albuterol as a rescue medication  - ok to use up to 2  pffs q4h prn     07/01/2016 acute extended ov/Amber Snow re:  Chief Complaint  Patient presents with  . Acute Visit    Pt c/o increased cough and SOB for the past 4 days.  Her cough is non prod. She has also noticed some wheezing. She had been using proair 1-2 x per wk, but has needed this daily the past 4 days.   acute onset with cough/rattling but no mucus x 4 days then worse doe but ok at rest, worst day was 06/29/16 and a little better since then , never used more than 4 pffs per day total saba even on worse days though acknowledges she was told she could increase to as much as 2 q4 h prn Baseline doe = MMRC2 = can't walk a nl pace on a flat grade s sob but does fine slow and flat eg shopping HT pushing cart   No obvious day to day or daytime variability or assoc excess/ purulent sputum or mucus plugs or  hemoptysis or cp or chest tightness, subjective wheeze or overt sinus or hb symptoms. No unusual exp hx or h/o childhood pna/ asthma or knowledge of premature birth.  Sleeping ok without nocturnal  or early am exacerbation  of respiratory  c/o's or need for noct saba. Also denies any obvious fluctuation of symptoms with weather or environmental changes or other aggravating or alleviating factors except as outlined above   Current Medications, Allergies, Complete Past Medical History, Past Surgical History, Family History, and Social History were reviewed in [employer] record.  ROS  The following are not active complaints unless bolded sore throat, dysphagia, dental problems, itching, sneezing,  nasal congestion or excess/ purulent secretions, ear ache,   fever, chills, sweats, unintended wt loss, classically pleuritic or exertional cp,  orthopnea pnd or leg swelling, presyncope, palpitations, abdominal pain, anorexia, nausea, vomiting, diarrhea  or change in bowel or bladder habits, change in stools or urine, dysuria,hematuria,  rash, arthralgias, visual complaints, headache, numbness, weakness or ataxia or problems with walking or coordination,  change in mood/affect or memory.                     Objective:   Physical Exam  Thin  amb anxious  wf nad/ rattling cough   07/29/2012  105  >102 09/24/2012 > 10/26/2012  99 > 12/10/2012 99 > 104 03/14/2013 > 06/20/2013 107 > 109 07/28/2013 > 11/09/2013  109 > 11/10/2014   113 > 03/13/2015  113 > 04/10/2015   113 > 07/10/2015  114 > 11/08/2015   103 > 01/22/2016 96 > 05/19/2016  89      Vital signs reviewed   -  - Note on arrival 02 sats  96% on RA    HEENT mild turbinate edema.  Oropharynx with dryness noted, post pharynx with few white scattered patches c/w thrush. .  No JVD or cervical adenopathy. Mild accessory muscle hypertrophy. Trachea midline, nl thryroid. Chest was hyperinflated by percussion with diminished breath sounds and  moderate increased exp time with bilateral mid exp  wheezes. Hoover sign positive at mid inspiration. Regular rate and rhythm without murmur gallop or rub or increase P2 -  No LE pitting edema    Abd: no hsm, nl excursion. Ext warm without cyanosis or clubbing.          CXR PA and Lateral:   07/01/2016 :    I personally reviewed images and agree with radiology impression as follows:  COPD. Atelectasis or early pneumonia in the anterior medial aspect of the right middle lobe.        Assessment & Plan:

## 2016-07-01 NOTE — Assessment & Plan Note (Signed)
-   PFT's 07/31/09 FEV1  1.36 (74%) ratio 45 and no better p B2,  DLCO 66%  - quit smoking June 2013 - PFT's 07/29/2012 FEV1 1.10 (63%) ratio 46 and no change p B2 and DLCO 57%  - 10/26/2012  Walked RA x 3 laps @ 185 ft each stopped due to end of study, no desats - Trial off spiriva 03/14/2013 > rechallenged 06/20/12 due to worse doe > d/cd around 06/26/13 and no worse off it  - 03/13/2015 trained on respimat > 90% effecitve > changed to stiolto 2 pffs each am > preferred symbicort so 04/10/2015 rec bevespi trial  - 04/10/2015  Walked RA x 3 laps @ 185 ft each stopped due to  End of study, nl pace, no sob or desat/ did c/o leg fatigue bilaterally  - 07/10/2015    try spiriva respimat 2 each am > unable to tolerate mouth effects so d/c'd 08/2015  - 05/19/2016  After extensive coaching HFA effectiveness =    90%   No need to change rx at this point but reviewed action plan from last ov to help her manage her own chronic problem with likely flares in future based on h/o aecopd  I had an extended discussion with the patient reviewing all relevant studies completed to date and  lasting 15 to 20 minutes of a 25 minute acute visit    Each maintenance medication was reviewed in detail including most importantly the difference between maintenance and prns and under what circumstances the prns are to be triggered using an action plan format that is not reflected in the computer generated alphabetically organized AVS.    Please see AVS for specific instructions unique to this visit that I personally wrote and verbalized to the the pt in detail and then reviewed with pt  by my nurse highlighting any  changes in therapy recommended at today's visit to their plan of care.

## 2016-07-01 NOTE — Assessment & Plan Note (Signed)
Mild flare assoc with ? atx vs RML pna/ multiple allergies noted so rec rx = zpak/ pred x 6 days and f/u ov/cxr in 4 weeks

## 2016-07-01 NOTE — Patient Instructions (Addendum)
For cough mucinex dm up to 1200 mg every 12 hours if needed  Prednisone 10 mg take  4 each am x 2 days,   2 each am x 2 days,  1 each am x 2 days and stop   No change in your other medications   Please remember to go to the   x-ray department downstairs in the basement  for your tests - we will call you with the results when they are available.  Keep previous appt, call in meantime if needed especially if mucus turns thicker or nastier

## 2016-07-01 NOTE — Progress Notes (Signed)
LMTCB

## 2016-07-01 NOTE — Telephone Encounter (Signed)
Notes Recorded by Tanda Rockers, MD on 07/01/2016 at 9:46 AM EST Call pt: Reviewed cxr and could have early pna so rx zpak then return in 4 weeks for f/u cxr  --------------------------------------------- Spoke with pt. She is aware of results. Rx has been sent in. Order has been placed for CXR. Nothing further was needed.

## 2016-07-14 DIAGNOSIS — M810 Age-related osteoporosis without current pathological fracture: Secondary | ICD-10-CM | POA: Diagnosis not present

## 2016-07-15 ENCOUNTER — Other Ambulatory Visit: Payer: Self-pay | Admitting: Obstetrics

## 2016-07-28 ENCOUNTER — Other Ambulatory Visit: Payer: Self-pay | Admitting: Internal Medicine

## 2016-07-29 ENCOUNTER — Ambulatory Visit (INDEPENDENT_AMBULATORY_CARE_PROVIDER_SITE_OTHER)
Admission: RE | Admit: 2016-07-29 | Discharge: 2016-07-29 | Disposition: A | Discharge status: Home / Self Care | Payer: Medicare Other | Source: Ambulatory Visit | Attending: Internal Medicine | Admitting: Internal Medicine

## 2016-07-29 DIAGNOSIS — J690 Pneumonitis due to inhalation of food and vomit: Secondary | ICD-10-CM

## 2016-07-29 DIAGNOSIS — R05 Cough: Secondary | ICD-10-CM | POA: Diagnosis not present

## 2016-07-30 ENCOUNTER — Telehealth: Payer: Self-pay | Admitting: Internal Medicine

## 2016-07-30 NOTE — Telephone Encounter (Signed)
Spoke with the pt  She is requesting cxr results  CXR done to f/u PNA on 07/29/16  Pt aware MW out of the office until next wk  Please advise, thanks

## 2016-07-31 ENCOUNTER — Encounter (HOSPITAL_COMMUNITY): Payer: Self-pay

## 2016-07-31 ENCOUNTER — Ambulatory Visit (HOSPITAL_COMMUNITY)
Admission: RE | Admit: 2016-07-31 | Discharge: 2016-07-31 | Disposition: A | Discharge status: Home / Self Care | Payer: Medicare Other | Source: Ambulatory Visit | Attending: Obstetrics | Admitting: Obstetrics

## 2016-07-31 DIAGNOSIS — M810 Age-related osteoporosis without current pathological fracture: Secondary | ICD-10-CM | POA: Insufficient documentation

## 2016-07-31 MED ORDER — SODIUM CHLORIDE 0.9 % IV SOLN
Freq: Once | INTRAVENOUS | Status: AC
  Administered 2016-07-31: 15:00:00 via INTRAVENOUS

## 2016-07-31 MED ORDER — ZOLEDRONIC ACID 5 MG/100ML IV SOLN
5.0000 mg | Freq: Once | INTRAVENOUS | Status: AC
  Administered 2016-07-31: 5 mg via INTRAVENOUS
  Filled 2016-07-31: qty 100

## 2016-07-31 NOTE — Discharge Instructions (Signed)
Drink  Fluids/water as tolerated over the next 72 hours Tylenol or ibuprofen if needed for aches and pains  Continue Calcium and Vit D as directed by your MD Call Md for any problems or questions. Call 911 for emergencies.       Reclast  Zoledronic Acid injection (Paget's Disease, Osteoporosis) What is this medicine? ZOLEDRONIC ACID (ZOE le dron ik AS id) lowers the amount of calcium loss from bone. It is used to treat Paget's disease and osteoporosis in women. This medicine may be used for other purposes; ask your health care provider or pharmacist if you have questions. COMMON BRAND NAME(S): Reclast, Zometa What should I tell my health care provider before I take this medicine? They need to know if you have any of these conditions: -aspirin-sensitive asthma -cancer, especially if you are receiving medicines used to treat cancer -dental disease or wear dentures -infection -kidney disease -low levels of calcium in the blood -past surgery on the parathyroid gland or intestines -receiving corticosteroids like dexamethasone or prednisone -an unusual or allergic reaction to zoledronic acid, other medicines, foods, dyes, or preservatives -pregnant or trying to get pregnant -breast-feeding How should I use this medicine? This medicine is for infusion into a vein. It is given by a health care professional in a hospital or clinic setting. Talk to your pediatrician regarding the use of this medicine in children. This medicine is not approved for use in children. Overdosage: If you think you have taken too much of this medicine contact a poison control center or emergency room at once. NOTE: This medicine is only for you. Do not share this medicine with others. What if I miss a dose? It is important not to miss your dose. Call your doctor or health care professional if you are unable to keep an appointment. What may interact with this medicine? -certain antibiotics given by  injection -NSAIDs, medicines for pain and inflammation, like ibuprofen or naproxen -some diuretics like bumetanide, furosemide -teriparatide This list may not describe all possible interactions. Give your health care provider a list of all the medicines, herbs, non-prescription drugs, or dietary supplements you use. Also tell them if you smoke, drink alcohol, or use illegal drugs. Some items may interact with your medicine. What should I watch for while using this medicine? Visit your doctor or health care professional for regular checkups. It may be some time before you see the benefit from this medicine. Do not stop taking your medicine unless your doctor tells you to. Your doctor may order blood tests or other tests to see how you are doing. Women should inform their doctor if they wish to become pregnant or think they might be pregnant. There is a potential for serious side effects to an unborn child. Talk to your health care professional or pharmacist for more information. You should make sure that you get enough calcium and vitamin D while you are taking this medicine. Discuss the foods you eat and the vitamins you take with your health care professional. Some people who take this medicine have severe bone, joint, and/or muscle pain. This medicine may also increase your risk for jaw problems or a broken thigh bone. Tell your doctor right away if you have severe pain in your jaw, bones, joints, or muscles. Tell your doctor if you have any pain that does not go away or that gets worse. Tell your dentist and dental surgeon that you are taking this medicine. You should not have major dental surgery while on  this medicine. See your dentist to have a dental exam and fix any dental problems before starting this medicine. Take good care of your teeth while on this medicine. Make sure you see your dentist for regular follow-up appointments. What side effects may I notice from receiving this medicine? Side  effects that you should report to your doctor or health care professional as soon as possible: -allergic reactions like skin rash, itching or hives, swelling of the face, lips, or tongue -anxiety, confusion, or depression -breathing problems -changes in vision -eye pain -feeling faint or lightheaded, falls -jaw pain, especially after dental work -mouth sores -muscle cramps, stiffness, or weakness -redness, blistering, peeling or loosening of the skin, including inside the mouth -trouble passing urine or change in the amount of urine Side effects that usually do not require medical attention (report to your doctor or health care professional if they continue or are bothersome): -bone, joint, or muscle pain -constipation -diarrhea -fever -hair loss -irritation at site where injected -loss of appetite -nausea, vomiting -stomach upset -trouble sleeping -trouble swallowing -weak or tired This list may not describe all possible side effects. Call your doctor for medical advice about side effects. You may report side effects to FDA at 1-800-FDA-1088. Where should I keep my medicine? This drug is given in a hospital or clinic and will not be stored at home. NOTE: This sheet is a summary. It may not cover all possible information. If you have questions about this medicine, talk to your doctor, pharmacist, or health care provider.  2018 Elsevier/Gold Standard (2013-09-10 14:19:57)

## 2016-07-31 NOTE — Telephone Encounter (Signed)
cxr was fine,no pna, no change in rx needed

## 2016-07-31 NOTE — Telephone Encounter (Signed)
Called and spoke with pt and she is aware of cxr results per MW.  Nothing further is needed.

## 2016-08-04 ENCOUNTER — Other Ambulatory Visit: Payer: Self-pay | Admitting: Urology

## 2016-08-04 DIAGNOSIS — C672 Malignant neoplasm of lateral wall of bladder: Secondary | ICD-10-CM | POA: Diagnosis not present

## 2016-08-05 ENCOUNTER — Encounter: Payer: Self-pay | Admitting: *Deleted

## 2016-08-05 ENCOUNTER — Other Ambulatory Visit: Payer: Self-pay | Admitting: Urology

## 2016-08-13 ENCOUNTER — Telehealth (INDEPENDENT_AMBULATORY_CARE_PROVIDER_SITE_OTHER): Payer: Self-pay | Admitting: Physical Medicine and Rehabilitation

## 2016-08-14 NOTE — Telephone Encounter (Signed)
Called patient and left message for her to call back to discuss.

## 2016-08-14 NOTE — Telephone Encounter (Signed)
Need to see how much it helped and for how long. It may time for trigger point injeciton

## 2016-08-14 NOTE — Telephone Encounter (Signed)
Patient said the pain feels more like trigger points. She is scheduled for an OV with possible trigger point injections.

## 2016-08-14 NOTE — Telephone Encounter (Signed)
Patient called back and left message. I called her back and left another message for her to call to discuss.

## 2016-08-18 NOTE — Progress Notes (Addendum)
07/01/16 lov pulmonary epic cxr 07/29/16 epic lov cards 09/28/15 epic

## 2016-08-18 NOTE — Patient Instructions (Addendum)
Amber Snow  08/18/2016   Your procedure is scheduled on: 08/26/16    Report to [facility] Main  Entrance Take [place]  elevators to 3rd floor to  [place] at        [phone].    Call this number if you have problems the morning of surgery [phone]   Remember: ONLY 1 PERSON MAY GO WITH YOU TO SHORT STAY TO GET  READY MORNING OF YOUR SURGERY.  Do not eat food or drink liquids :After Midnight.     Take these medicines the morning of surgery with A SIP OF WATER: symbicort (bring), famotidine, baclofen, alprazolam                                You may not have any metal on your body including hair pins and              piercings  Do not wear jewelry, make-up, lotions, powders or perfumes, deodorant             Do not wear nail polish.  Do not shave  48 hours prior to surgery.              Do not bring valuables to the hospital. [place].  Contacts, dentures or bridgework may not be worn into surgery.      Patients discharged the day of surgery will not be allowed to drive home.  Name and phone number of your driver:  Special Instructions: N/A              Please read over the following fact sheets you were given: _____________________________________________________________________            [facility] - Preparing for Surgery Before surgery, you can play an important role.  Because skin is not sterile, your skin needs to be as free of germs as possible.  You can reduce the number of germs on your skin by washing with CHG (chlorahexidine gluconate) soap before surgery.  CHG is an antiseptic cleaner which kills germs and bonds with the skin to continue killing germs even after washing. Please DO NOT use if you have an allergy to CHG or antibacterial soaps.  If your skin becomes reddened/irritated stop using the CHG and inform your nurse when you arrive at Short Stay. Do not shave (including  legs and underarms) for at least 48 hours prior to the first CHG shower.  You may shave your face/neck. Please follow these instructions carefully:  1.  Shower with CHG Soap the night before surgery and the  morning of Surgery.  2.  If you choose to wash your hair, wash your hair first as usual with your  normal  shampoo.  3.  After you shampoo, rinse your hair and body thoroughly to remove the  shampoo.                           4.  Use CHG as you would any other liquid soap.  You can apply chg directly  to the skin and wash  Gently with a scrungie or clean washcloth.  5.  Apply the CHG Soap to your body ONLY FROM THE NECK DOWN.   Do not use on face/ open                           Wound or open sores. Avoid contact with eyes, ears mouth and genitals (private parts).                       Wash face,  Genitals (private parts) with your normal soap.             6.  Wash thoroughly, paying special attention to the area where your surgery  will be performed.  7.  Thoroughly rinse your body with warm water from the neck down.  8.  DO NOT shower/wash with your normal soap after using and rinsing off  the CHG Soap.                9.  Pat yourself dry with a clean towel.            10.  Wear clean pajamas.            11.  Place clean sheets on your bed the night of your first shower and do not  sleep with pets. Day of Surgery : Do not apply any lotions/deodorants the morning of surgery.  Please wear clean clothes to the hospital/surgery center.  FAILURE TO FOLLOW THESE INSTRUCTIONS MAY RESULT IN THE CANCELLATION OF YOUR SURGERY PATIENT SIGNATURE_________________________________  NURSE SIGNATURE__________________________________  ________________________________________________________________________

## 2016-08-19 ENCOUNTER — Encounter (INDEPENDENT_AMBULATORY_CARE_PROVIDER_SITE_OTHER): Payer: Self-pay | Admitting: Physical Medicine and Rehabilitation

## 2016-08-19 ENCOUNTER — Ambulatory Visit (INDEPENDENT_AMBULATORY_CARE_PROVIDER_SITE_OTHER): Payer: Medicare Other | Admitting: Physical Medicine and Rehabilitation

## 2016-08-19 VITALS — BP 133/67 | HR 83

## 2016-08-19 DIAGNOSIS — M542 Cervicalgia: Secondary | ICD-10-CM | POA: Diagnosis not present

## 2016-08-19 DIAGNOSIS — G8929 Other chronic pain: Secondary | ICD-10-CM

## 2016-08-19 DIAGNOSIS — M546 Pain in thoracic spine: Secondary | ICD-10-CM | POA: Diagnosis not present

## 2016-08-19 DIAGNOSIS — M5442 Lumbago with sciatica, left side: Secondary | ICD-10-CM

## 2016-08-19 DIAGNOSIS — M5441 Lumbago with sciatica, right side: Secondary | ICD-10-CM

## 2016-08-19 DIAGNOSIS — M609 Myositis, unspecified: Secondary | ICD-10-CM | POA: Diagnosis not present

## 2016-08-19 DIAGNOSIS — G894 Chronic pain syndrome: Secondary | ICD-10-CM

## 2016-08-19 DIAGNOSIS — M5010 Cervical disc disorder with radiculopathy, unspecified cervical region: Secondary | ICD-10-CM | POA: Diagnosis not present

## 2016-08-19 MED ORDER — LIDOCAINE HCL 1 % IJ SOLN
3.0000 mL | INTRAMUSCULAR | Status: AC | PRN
  Administered 2016-08-19: 3 mL

## 2016-08-19 MED ORDER — TRIAMCINOLONE ACETONIDE 40 MG/ML IJ SUSP
20.0000 mg | INTRAMUSCULAR | Status: AC | PRN
  Administered 2016-08-19: 20 mg via INTRAMUSCULAR

## 2016-08-19 NOTE — Progress Notes (Signed)
Amber Snow - 83 y.o. female MRN [id number]  Date of birth: 1932/07/09  Office Visit Note: Visit Date: 08/19/2016 PCP: Walker Kehr, MD Referred by: Cassandria Anger, MD  Subjective: Chief Complaint  Patient presents with  . Neck - Pain   HPI: Amber Snow is a pleasant 83 year old female with complicated neck pain and upper shoulder and upper back pain. She has pretty significant COPD with accessory muscle use for breathing. She also suffers from chronic bladder cancer. She comes in today with worsening neck pain really at the base of the C7 spinous process and laterally out to both shoulders. She denies any specific shoulder pain with movement. She denies any paresthesias down the arms. She gets occasional numbness and tingling in the right hand. She's had no prior history of carpal tunnel syndrome. She has not had electrodiagnostic studies. She is also getting pain really at the midline of the thoracic spine between the shoulder blades. I can be worse with coughing and with movement as well. She is unsure if the 2 are related. She has a cervical spine MRI this fairly recent and reviewed below. The last time I saw her we completed cervical interlaminar epidural steroid injection with good relief up until just recently. This was completed in February of this year. She has had physical therapy. She has had medication management with various medications including muscle relaxers.  Today she also mentions low back pain radiating into the left leg at times with cramping at night. She denies any focal weakness or trauma. We have not evaluated her lumbar spine in the past. She's not had an MRI of the lumbar spine. She's had no focal weakness. No prior lumbar surgery.    Review of Systems  Constitutional: Negative for chills, fever, malaise/fatigue and weight loss.  HENT: Negative for hearing loss and sinus pain.   Eyes: Negative for blurred vision, double vision and photophobia.    Respiratory: Negative for cough and shortness of breath.   Cardiovascular: Negative for chest pain, palpitations and leg swelling.  Gastrointestinal: Negative for abdominal pain, nausea and vomiting.  Genitourinary: Negative for flank pain.  Musculoskeletal: Positive for back pain, joint pain and neck pain. Negative for myalgias.  Skin: Negative for itching and rash.  Neurological: Negative for tremors, focal weakness and weakness.  Endo/Heme/Allergies: Negative.   Psychiatric/Behavioral: Negative for depression.  All other systems reviewed and are negative.  Otherwise per HPI.  Assessment & Plan: Visit Diagnoses:  1. Cervicalgia   2. Chronic midline thoracic back pain   3. Cervical disc disorder with radiculopathy   4. Myofascitis   5. Chronic pain syndrome   6. Chronic bilateral low back pain with bilateral sciatica     Plan: Findings:  Chronic worsening neck and upper back pain with positive trigger points in the supraspinatus levator scapula and trapezius bilaterally. We did complete trigger point injections today in those areas and we'll see how she does. Depending on her relief would probably try to repeat the cervical epidural injection. She got more than 50% relief for 6 weeks and the symptoms have returned to a degree. She does have this thoracic pain which I still feel is myofascial in nature. It might be worth regrouping with a physical therapist for dry needling at some point. For her low back pain we did further evaluated today. This does sound more arthritic to me because it be related to stenosis. If her symptoms are still present and her neck pain is doing better  than I would probably look at an MRI of the lumbar spine. I would do that because of the radicular nature and the fact that she's had a history of bladder cancer. I spent more than 25 minutes speaking face-to-face with the patient with 50% of the time in counseling.    Meds & Orders: No orders of the defined types  were placed in this encounter.   Orders Placed This Encounter  Procedures  . Trigger Point Injection    Follow-up: Return if symptoms worsen or fail to improve.   Procedures: Trigger Point Inj Date/Time: 08/19/2016 8:44 AM Performed by: Magnus Sinning Authorized by: Magnus Sinning   Consent Given by:  Patient Site marked: the procedure site was marked   Timeout: prior to procedure the correct patient, procedure, and site was verified   Total # of Trigger Points:  3 or more Location: neck and back   Needle Size:  25 G Approach:  Dorsal Medications #1:  20 mg triamcinolone acetonide 40 MG/ML Medications #2:  3 mL lidocaine 1 % Additional Injections?: No   Patient tolerance:  Patient tolerated the procedure well with no immediate complications Comments: Needling technique for trigger points used for the levator scapulae, trapezius and supraspinatus.    No notes on file   Clinical History: Lumbar spine MRI 08/19/2015  IMPRESSION: 1. Progressed retrolisthesis at C5-C6 and disc degeneration at C3-C4 and C6-C7 since 2014. Subsequent increased mild spinal stenosis at C3-C4 with no spinal cord mass effect, and increased mild to moderate left C6 neural foraminal stenosis. 2. New or increased T1-T2 disc herniation with new mild spinal stenosis. 3. Stable degenerative moderate to severe neural foraminal stenosis at the bilateral C4 and C5 nerve levels.  She reports that she quit smoking about 4 years ago. Her smoking use included Cigarettes. She has a 50.00 pack-year smoking history. She has never used smokeless tobacco. No results for input(s): HGBA1C, LABURIC in the last 8760 hours.  Objective:  VS:  HT:    WT:   BMI:     BP:133/67  HR:83bpm  TEMP: ( )  RESP:  Physical Exam  Constitutional: She is oriented to person, place, and time. She appears well-developed and well-nourished. No distress.  HENT:  Head: Normocephalic and atraumatic.  Nose: Nose normal.    Mouth/Throat: Oropharynx is clear and moist.  Eyes: Conjunctivae are normal. Pupils are equal, round, and reactive to light.  Neck: Normal range of motion. Neck supple.  Cardiovascular: Regular rhythm and intact distal pulses.   Pulmonary/Chest: No respiratory distress. She has no wheezes.  Patient uses accessory muscles for breathing. Her breathing is somewhat labored. She is somewhat anxious.  Abdominal: She exhibits no distension.  Musculoskeletal:  Cervical range of motion is limited with extension and rotation. She has a negative Spurling's test. She is a negative Hoffmann's test bilaterally. She has arthritic changes in the hands bilaterally. She has a negative Phalen's test bilaterally at the wrist. She has good strength in the upper extremities bilaterally. She has no real impingement signs of the shoulders. She does have focal trigger points in the levator scapula, trapezius and supraspinatus. These reproduce a lot of her pain. Her low back exam shows no scoliosis it shows extension with pain. She has no pain with hip rotation and she has good distal strength.  Neurological: She is alert and oriented to person, place, and time. She exhibits normal muscle tone. Coordination normal.  Skin: Skin is warm. No rash noted. No erythema.  Psychiatric:  Her behavior is normal.  Anxious appearing  Nursing note and vitals reviewed.   Ortho Exam Imaging: No results found.  Past Medical/Family/Surgical/Social History: Medications & Allergies reviewed per EMR Patient Active Problem List   Diagnosis Date Noted  . Weight loss 06/26/2016  . Cervical disc disorder with radiculopathy 12/15/2015  . Cervical spondylosis with myelopathy 12/15/2015  . COPD exacerbation ([place]) 11/10/2015  . Essential hypertension 11/10/2015  . Oral candidiasis 08/09/2015  . Weakness 02/21/2015  . Edema 11/08/2014  . Acute bronchitis 07/17/2014  . Cellulitis of leg, left 12/28/2013  . Laceration of leg 12/15/2013  .  CAD (coronary artery disease) 09/02/2013  . Well adult exam 07/06/2013  . Dizzy 05/10/2013  . Chronic systolic CHF (congestive heart failure) ([place]) 12/30/2012  . Sepsis ([place]) 09/16/2012  . CAP (community acquired pneumonia) 09/16/2012  . Nonischemic cardiomyopathy ([place]) 06/02/2012  . Lung nodule 06/01/2012  . Bladder cancer ([place]) 12/17/2011  . LV dysfunction 09/26/2011  . Dyspnea 08/13/2011  . PAD (peripheral artery disease) ([place]) 08/13/2011  . Murmur 08/13/2011  . Other dysphagia 09/09/2010  . LOW BACK PAIN, CHRONIC 10/17/2009  . Low back pain radiating to both legs 10/17/2009  . THYROID FUNCTION TEST, ABNORMAL 07/19/2009  . LEUKOCYTOSIS 07/02/2009  . Nonspecific (abnormal) findings on radiological and other examination of body structure 07/02/2009  . Fatigue 06/29/2009  . Vitamin D deficiency 11/20/2008  . ARTHRALGIA 11/20/2008  . UNSPECIFIED MYALGIA AND MYOSITIS 11/20/2008  . OTH SYMPTOMS INVLV NERV&MUSCULOSKELETAL SYSTEMS 03/15/2008  . LBBB (left bundle branch block) 02/14/2008  . OSTEOPENIA 11/12/2007  . RENAL CALCULUS 08/16/2007  . DIVERTICULOSIS, COLON 12/25/2006  . HYPERLIPIDEMIA 10/02/2006  . COPD GOLD II  10/02/2006   Past Medical History:  Diagnosis Date  . Acute exacerbation of chronic obstructive pulmonary disease (COPD) (HCC)    ON PREDNISONE INCREASED DOSE  FOR LAST WEEK  . Arthritis HANDS  . Bladder calculus   . Bladder cancer ([place])    Chemo last 10'17. -[facility]  . CAD (coronary artery disease) CARDIOLOGSIT-- DR Martinique   05-17-2013minimal nonobstructive CAD per cath with EF of 35 to 40% and normal filling pressures and right heart pressures-Dr. Martinique follows  . Chronic systolic CHF (congestive heart failure) ([place])   . COPD, moderate ([place]) PULMOLOGIST-- DR Melvyn Novas   W/ EMPHYSEMA  (COPD  GOLD II)- Dr. Melvyn Novas follows  . Diverticulosis of colon   . Dyspnea    with exertion only- intermittent cough  . Dyspnea on exertion   . Hearing impaired person, bilateral     wears hearing aids bilateral  . History of nephrolithiasis   . Hyperlipidemia   . LBBB (left bundle branch block)    CHRONIC  . Myalgia   . Neck pain    07-30-15 at present and flares periodically"? stenosis"  . Nocturia   . Nodule of right lung    APICAL 1.0CM (MONITORED BY DR Melvyn Novas)  . Nonischemic cardiomyopathy ([place])   . Osteopenia   . PAC (premature atrial contraction)   . Pneumonia 1-2 YEARS AGO  . Renal calculus, bilateral   . Wears hearing aid    BILATERAL   Family History  Problem Relation Age of Onset  . Heart disease Mother   . Hypertension Mother   . Hypertension Father   . Heart attack Father   . Heart disease Father   . Thyroid disease Sister   . Heart attack Sister   . Stroke Sister   . COPD Sister   . Heart attack Brother   .  Cancer Maternal Grandmother     breast cancer  . Diabetes Paternal Grandmother   . Asthma Neg Hx    Past Surgical History:  Procedure Laterality Date  . CARDIAC CATHETERIZATION  09-12-2011  DR Martinique   MINIMAL NONOBSTRUCTIVE CAD/ MODERATE TO SEVERE LV DYSFUNCTION/  NORMAL RIGHT HEART PRESSURES AND LV FILLING PRESSURES  . CATARACT EXTRACTION W/ INTRAOCULAR LENS  IMPLANT, BILATERAL  1998  . COSMETIC SURGERY     eyelids  . CYSTO/ LEFT RETROGRADE PYELOGRAM/ LEFT URETERAL STENT PLACEMENT  10-16-2001   STONE  . CYSTOSCOPY  07/01/2011   Procedure: CYSTOSCOPY;  Surgeon: Hanley Ben, MD;  Location: [facility];  Service: Urology;  Laterality: N/A;  retrieval l of bladder stone  . CYSTOSCOPY N/A 08/20/2012   Procedure: CYSTOSCOPY;  Surgeon: Hanley Ben, MD;  Location[facility];  Service: Urology;  Laterality: N/A;  fulgeration of bladderm tumors  . CYSTOSCOPY N/A 06/23/2014   Procedure: CYSTOSCOPY;  Surgeon: Arvil Persons, MD;  Location: [facility];  Service: Urology;  Laterality: N/A;  . CYSTOSCOPY W/ RETROGRADES Right 04/01/2013   Procedure: CYSTOSCOPY WITH RETROGRADE PYELOGRAM;  Surgeon:  Hanley Ben, MD;  Location: [place];  Service: Urology;  Laterality: Right;  . CYSTOSCOPY WITH BIOPSY N/A 02/06/2015   Procedure: CYSTO WITH BLADDER BIOPSY;  Surgeon: Festus Aloe, MD;  Location: [facility];  Service: Urology;  Laterality: N/A;  . CYSTOSCOPY WITH BIOPSY N/A 04/04/2016   Procedure: CYSTOSCOPY WITH BIOPSY AND FULGURATION EPIRUBICIN BLADDER INSTILLATION;  Surgeon: Festus Aloe, MD;  Location: [facility];  Service: Urology;  Laterality: N/A;  . CYSTOSCOPY WITH FULGERATION N/A 11/27/2015   Procedure: CYSTOSCOPY WITH FULGERATION BLADDER BIOPSY ;  Surgeon: Festus Aloe, MD;  Location: [facility];  Service: Urology;  Laterality: N/A;  . CYSTOSCOPY WITH LITHOLAPAXY N/A 04/01/2013   Procedure: CYSTOSCOPY WITH HOLMIUM LASER OF  BLADDER CALCULUS;  Surgeon: Hanley Ben, MD;  Location: [place];  Service: Urology;  Laterality: N/A;  . CYSTOSCOPY WITH RETROGRADE PYELOGRAM, URETEROSCOPY AND STENT PLACEMENT Bilateral 07/31/2015   Procedure: CYSTOSCOPY WITH FULGERATION BLADDER BIOPSY, CYSTOSCOPY WITH BILATERAL RETROGRADE PYELOGRAMS;  Surgeon: Festus Aloe, MD;  Location: [facility];  Service: Urology;  Laterality: Bilateral;  . EXTRACORPOREAL SHOCK WAVE LITHOTRIPSY  2003  . FULGURATION OF BLADDER TUMOR  06/23/2014   Procedure: FULGURATION OF BLADDER TUMOR;  Surgeon: Arvil Persons, MD;  Location: [facility];  Service: Urology;;  . FULGURATION OF BLADDER TUMOR N/A 02/06/2015   Procedure: FULGURATION OF BLADDER TUMORS;  Surgeon: Festus Aloe, MD;  Location: [facility];  Service: Urology;  Laterality: N/A;  . HOLMIUM LASER APPLICATION N/A 89/06/8099   Procedure: HOLMIUM LASER APPLICATION;  Surgeon: Hanley Ben, MD;  Location: [place];  Service: Urology;  Laterality: N/A;  . LEFT AND RIGHT HEART CATHETERIZATION WITH CORONARY ANGIOGRAM Bilateral 09/12/2011   Procedure: LEFT AND RIGHT HEART CATHETERIZATION WITH CORONARY ANGIOGRAM;   Surgeon: Peter M Martinique, MD;  Location: [facility];  Service: Cardiovascular;  Laterality: Bilateral;  . LEFT LONG FINGER ARTHROPLASTY & PULLEY RELASE  02-17-2005  . ORIF FINGER / THUMB FRACTURE    . PULMONARY FUNCTION ANALYSIS  07-31-2009   MODERATE TO SEVERE OBSTRUCTIVE AIRWAY DISEASE/ INSIGNIFICANT RESPONSE TO BRONCHODILATORS/ EMPHYSEMA PATTERN/ MILD DECREASE IN DIFFUSE CAPACITY FEF 25-75 CHANGED BY 8%  . TONSILLECTOMY  CHILD  . TOOTH EXTRACTION  01-17-15   LOWER TOOTH CROWN  BROKE OFF AND WAS EXTRACTED  . TRANSTHORACIC ECHOCARDIOGRAM  last one 03-15-2014  DR Martinique   GRADE I DIASTOLIC DYSFUNCTION/  confirmed by dr Liane Comber  EF 45% (down from 60-65% 03-12-2013 but up from 25-30% compared to last echo 10-01-2012 /  NORMAL LV SIZE AND WALL THICKNESS/  trivial TR  . TRANSURETHRAL RESECTION OF BLADDER TUMOR  07/01/2011   Procedure: TRANSURETHRAL RESECTION OF BLADDER TUMOR (TURBT);  Surgeon: Hanley Ben, MD;  Location: [facility];  Service: Urology;  Laterality: N/A;  CYSTO  . TRANSURETHRAL RESECTION OF BLADDER TUMOR N/A 02/06/2015   Procedure:  TRANSURETHRAL RESECTION OF BLADDER TUMOR (TURBT) GREATER THAN 5 CM; RIGHT RETROGRADE, RIGHT STENT PLACEMENT;  Surgeon: Festus Aloe, MD;  Location: [facility];  Service: Urology;  Laterality: N/A;  . TRANSURETHRAL RESECTION OF BLADDER TUMOR N/A 07/31/2015   Procedure:  TRANSURETHRAL RESECTION OF BLADDER TUMOR (TURBT); REMOVAL OF DYSTROPHIC CALCIFICATIONS;  Surgeon: Festus Aloe, MD;  Location: [facility];  Service: Urology;  Laterality: N/A;  . TRANSURETHRAL RESECTION OF BLADDER TUMOR N/A 11/27/2015   Procedure: TRANSURETHRAL RESECTION OF BLADDER TUMOR (TURBT);  Surgeon: Festus Aloe, MD;  Location: [facility];  Service: Urology;  Laterality: N/A;  . TUBAL LIGATION  AGE 68'S   Social History   Occupational History  . Retired     Government social research officer Asst   Social History Main Topics  . Smoking status: Former Smoker    Packs/day: 1.00    Years:  50.00    Types: Cigarettes    Quit date: 09/27/2011  . Smokeless tobacco: Never Used  . Alcohol use Yes     Comment: wine rarely  . Drug use: No  . Sexual activity: Not on file

## 2016-08-19 NOTE — Progress Notes (Deleted)
Neck pain both sides equal. Pain is also lower in "mid back." Pain is pretty constant- does not seem to change based on activity or position. No pain in arms. Right arm numbness at night sometimes.

## 2016-08-22 ENCOUNTER — Encounter (HOSPITAL_COMMUNITY): Payer: Self-pay

## 2016-08-22 ENCOUNTER — Other Ambulatory Visit: Payer: Self-pay

## 2016-08-22 ENCOUNTER — Encounter (HOSPITAL_COMMUNITY)
Admission: RE | Admit: 2016-08-22 | Discharge: 2016-08-22 | Disposition: A | Discharge status: Home / Self Care | Payer: Medicare Other | Source: Ambulatory Visit | Attending: Urology | Admitting: Urology

## 2016-08-22 DIAGNOSIS — Z01810 Encounter for preprocedural cardiovascular examination: Secondary | ICD-10-CM | POA: Diagnosis not present

## 2016-08-22 DIAGNOSIS — C679 Malignant neoplasm of bladder, unspecified: Secondary | ICD-10-CM | POA: Insufficient documentation

## 2016-08-22 DIAGNOSIS — Z01812 Encounter for preprocedural laboratory examination: Secondary | ICD-10-CM | POA: Diagnosis not present

## 2016-08-22 DIAGNOSIS — I447 Left bundle-branch block, unspecified: Secondary | ICD-10-CM | POA: Insufficient documentation

## 2016-08-22 HISTORY — DX: Personal history of urinary calculi: Z87.442

## 2016-08-22 HISTORY — DX: Gastro-esophageal reflux disease without esophagitis: K21.9

## 2016-08-22 LAB — BASIC METABOLIC PANEL
Anion gap: 8 (ref 5–15)
BUN: 32 mg/dL — AB (ref 6–20)
CALCIUM: 9.9 mg/dL (ref 8.9–10.3)
CO2: 26 mmol/L (ref 22–32)
CREATININE: 1 mg/dL (ref 0.44–1.00)
Chloride: 105 mmol/L (ref 101–111)
GFR calc Af Amer: 60 mL/min — ABNORMAL LOW (ref >60)
GFR, EST NON AFRICAN AMERICAN: 51 mL/min — AB (ref >60)
GLUCOSE: 105 mg/dL — AB (ref 65–99)
Potassium: 5.3 mmol/L — ABNORMAL HIGH (ref 3.5–5.1)
Sodium: 139 mmol/L (ref 135–145)

## 2016-08-22 LAB — CBC
HEMATOCRIT: 36.8 % (ref 36.0–46.0)
Hemoglobin: 11.9 g/dL — ABNORMAL LOW (ref 12.0–15.0)
MCH: 30.2 pg (ref 26.0–34.0)
MCHC: 32.3 g/dL (ref 30.0–36.0)
MCV: 93.4 fL (ref 78.0–100.0)
PLATELETS: 346 10*3/uL (ref 150–400)
RBC: 3.94 MIL/uL (ref 3.87–5.11)
RDW: 13.7 % (ref 11.5–15.5)
WBC: 13.1 10*3/uL — AB (ref 4.0–10.5)

## 2016-08-22 NOTE — Progress Notes (Signed)
Cbc and bmp done 08/22/16 routed to Dr. Junious Silk via epic

## 2016-08-25 ENCOUNTER — Telehealth (INDEPENDENT_AMBULATORY_CARE_PROVIDER_SITE_OTHER): Payer: Self-pay | Admitting: Physical Medicine and Rehabilitation

## 2016-08-25 DIAGNOSIS — M5441 Lumbago with sciatica, right side: Principal | ICD-10-CM

## 2016-08-25 DIAGNOSIS — M5442 Lumbago with sciatica, left side: Principal | ICD-10-CM

## 2016-08-25 DIAGNOSIS — G8929 Other chronic pain: Secondary | ICD-10-CM

## 2016-08-25 NOTE — Telephone Encounter (Signed)
Patient would like MRI. She is having surgery tomorrow, so I will need to make a note not to schedule her for at least a week per her request.

## 2016-08-25 NOTE — H&P (Signed)
Office Visit Report     08/04/2016   --------------------------------------------------------------------------------   Amber Snow  MRN: [id number]  PRIMARY CARE:  Evie Lacks. Plotnikov, MD  DOB: 1933-01-26, 83 year old Female  REFERRING:  Jimmey Ralph, NP  SSN: -[phone]  PROVIDER:  Festus Aloe, M.D.    LOCATION:  [facility] [phone]   --------------------------------------------------------------------------------   CC: I have bladder cancer that has been treated.  HPI: Amber Snow is a 83 year-old female established patient who is here for follow-up of bladder cancer treatment.  Her bladder cancer was superficial and limitied to the bladder lining. Her bladder cancer was not muscle invasive. She had treatment with the following intravesical agents: bcg. Patient denies mitomycin, interferon, and adriamycin.   She did have a TURBT. Her last bladder tumor was resected approximately 11/30/2015. She has had the following number of bladder resections: 4.   She has not had blood in her urine recently. She is not having pain in new locations.   Her last cysto was 07/31/2015. Her last radiologic test to evaluate the kidneys was 12/19/2015.   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   Last intravesical: [facility] Oct 2017 x 6; BCG Jan 2017 second induction   Last upper tract: CT 12/19/2015 (pulm nodules stable, b9), Apr 2017 RGP's nl; CT Feb 2016   She's been well. No dysuria or gross hematuria. UA today with a few wbc's and rare bac. She has Cipro listed as an allergy which was from some swelling in the left arm after it was started IV. She has not had a problem with it orally.      ALLERGIES: Boniva KIT Cipro TABS - Swelling Penicillins - rash    MEDICATIONS: Albuterol  Sulfate 4 mg tablet Oral  Albuterol Sulfate Hfa 90 mcg hfa aerosol with adapter Inhalation  Ibuprofen 800 MG Oral Tablet Oral  Losartan Potassium 50 mg tablet Oral  Reclast 5 mg/100 ml iv solution, piggyback, bottle Intravenous  Symbicort 160 mcg-4.5 mcg/actuation hfa aerosol with adapter Inhalation  Trazodone Hcl 50 mg tablet Oral  Vitamin D3 2,000 unit tablet Oral     GU PSH: Cysto Bladder Stone >2.5cm - 2014 Cysto Bladder Ureth Biopsy - 02/20/2015 Cysto Fulgurate < 0.5 cm - 08/13/2015, 2016, 2014 Cystoscopy - 03/19/2016, 11/15/2015 Cystoscopy Insert Stent - 02/20/2015 Cystoscopy TURBT <2 cm - 11/27/2015, 02/20/2015 Cystoscopy TURBT 2-5 cm - 04/04/2016, 2013 ESWL - 2009 Locm 300-399Mg/Ml Iodine,1Ml - 03/14/2016, 12/19/2015    NON-GU PSH: Cataract Surgery.. Hand/finger Surgery Remove Tonsils - 2009    GU PMH: Dysuria (Worsening, Chronic), Culture urine. Gentamicin 80 mg IM today. Uribel 118 mg 1 po TID PRN - 05/09/2016, (Worsening, Chronic), Culture urine. Will empirically begin SMZ-TMP DS 1 po BID X 7 days till culture complete. Uribel 118 mg 1 po TID PRN. Instructed to contact oncall over weekend if she has any acute changes ie temp >100.5, chills, or n/v, - 04/18/2016 Urinary Frequency (Acute), Uribel 118 mg 1 po TID PRN. - 04/18/2016 Bladder Cancer Lateral - 03/19/2016, - 01/29/2016, - 12/14/2015, - 11/15/2015 Flank Pain - 01/29/2016 Bladder Cancer, overlapping sites, Primary malignant neoplasm of overlapping sites of bladder - 09/07/2015 Bladder Cancer, Unspec, Malignant tumor of urinary bladder - 07/06/2015 Bladder Cancer Posterior, Malignant neoplasm  of posterior wall of urinary bladder - 04/02/2015 Acute pyelonephritis, Pyelonephritis, acute - 03/04/2015 Urinary Tract Inf, Unspec site, Acute UTI - 03/04/2015, Urinary tract infection, - 2016 Crossing vessel and stricture of ureter w/o hydronephrosis, Ureteral obstruction - 02/13/2015 Kidney Stone, Nephrolithiasis - 10/02/2014, Kidney stone  on left side, - 2015, Kidney stone on right side, - 2015 Bladder Stone, Bladder calculus - 2014 Calculus Ureter, Calculus of distal right ureter - 2014, Ureteral Stone, - 2014, Calculus of left ureter, - 2014 Hematuria, Unspec, Hematuria - 2014 Personal Hx urinary calculi, Nephrolithiasis - 2014    NON-GU PMH: Other specified postprocedural states, S/P cysto/TURBT. Voiding trail today. Understands if unable to void in 6 hrs RTC - 04/09/2016 Encounter for general adult medical examination without abnormal findings, Encounter for preventive health examination - 07/09/2015 Solitary pulmonary nodule, Lung nodule - 2016    FAMILY HISTORY: Family Health Status Number - Runs In Family Father Deceased At [place] ___ - Runs In Family Heart Disease - Father Hypertension - Mother Mother Deceased At Age 58 from diabetic complicati - Runs In Family nephrolithiasis - Runs In Family   SOCIAL HISTORY: Marital Status: Married Current Smoking Status: Patient does not smoke anymore.  Uses smokeless tobacco. Drinks 2 caffeinated drinks per day. Patient's occupation is/was retired.    REVIEW OF SYSTEMS:    GU Review Female:   Patient reports frequent urination, hard to postpone urination, and get up at night to urinate. Patient denies burning /pain with urination, leakage of urine, stream starts and stops, trouble starting your stream, have to strain to urinate, and currently pregnant.  Gastrointestinal (Upper):   Patient denies nausea, vomiting, and indigestion/ heartburn.  Gastrointestinal (Lower):   Patient denies diarrhea and constipation.  Constitutional:   Patient denies fever, night sweats, weight loss, and fatigue.  Skin:   Patient denies skin rash/ lesion and itching.  Eyes:   Patient denies blurred vision and double vision.  Ears/ Nose/ Throat:   Patient denies sore throat and sinus problems.  Hematologic/Lymphatic:   Patient denies swollen glands and easy bruising.  Cardiovascular:   Patient  denies leg swelling and chest pains.  Respiratory:   Patient denies cough and shortness of breath.  Endocrine:   Patient denies excessive thirst.  Musculoskeletal:   Patient denies back pain and joint pain.  Neurological:   Patient denies headaches and dizziness.  Psychologic:   Patient denies depression and anxiety.   VITAL SIGNS:      08/04/2016 11:21 AM  Weight 92 lb / 41.73 kg  Height 63 in / 160.02 cm  BP 119/71 mmHg  Pulse 90 /min  Temperature 98.2 F / 37 C  BMI 16.3 kg/m   GU PHYSICAL EXAMINATION:    External Genitalia: No hirsutism, no rash, no scarring, no cyst, no erythematous lesion, no papular lesion, no blanched lesion, no warty lesion. No edema.  Urethral Meatus: Normal size. Normal position. No discharge.   MULTI-SYSTEM PHYSICAL EXAMINATION:    Constitutional: Well-nourished. No physical deformities. Normally developed. Good grooming.  Neck: Neck symmetrical, not swollen. Normal tracheal position.  Respiratory: No labored breathing, no use of accessory muscles.   Cardiovascular: Normal temperature, normal extremity pulses, no swelling, no varicosities.  Neurologic / Psychiatric: Oriented to time, oriented to place, oriented to person. No depression, no anxiety, no agitation.     PAST DATA REVIEWED:  Source Of History:  Patient   PROCEDURES:         Flexible Cystoscopy - 52000  Risks, benefits, and  some of the potential complications of the procedure were discussed at length with the patient including infection, bleeding, voiding discomfort, urinary retention, fever, chills, sepsis, and others. All questions were answered. Informed consent was obtained. Antibiotic prophylaxis was given. Sterile technique and intraurethral analgesia were used. Chaperone - Whitney - for exam and cystoscopy.   Meatus:  Normal size. Normal location. Normal condition.  Urethra:  No hypermobility. No leakage.  Ureteral Orifices:  Normal location. Normal size. Normal shape. Effluxed clear  urine.  Bladder:  1 cm tumor - right posterior. No trabeculation. Normal mucosa. No stones.      The lower urinary tract was carefully examined. The procedure was well-tolerated and without complications. Antibiotic instructions were given. Instructions were given to call the office immediately for bloody urine, difficulty urinating, urinary retention, painful or frequent urination, fever, chills, nausea, vomiting or other illness. The patient stated that she understood these instructions and would comply with them.          Urinalysis w/Scope - 81001 Dipstick Dipstick Cont'd Micro  Color: Straw Bilirubin: Neg WBC/hpf: 20 - 40/hpf  Appearance: Cloudy Ketones: Neg RBC/hpf: 0 - 2/hpf  Specific Gravity: 1.020 Blood: 1+ Bacteria: Few (10-25/hpf)  pH: 6.0 Protein: 1+ Cystals: NS (Not Seen)  Glucose: Neg Urobilinogen: 0.2 Casts: NS (Not Seen)    Nitrites: Neg Trichomonas: Not Present    Leukocyte Esterase: 3+ Mucous: Not Present      Epithelial Cells: NS (Not Seen)      Yeast: NS (Not Seen)      Sperm: Not Present    Notes: MICROSCOPIC PERFORMED ON UNCONCENTRATED URINE    ASSESSMENT:      ICD-10 Details  1 GU:   Bladder Cancer Lateral - C67.2    PLAN:            Medications Stop Meds: Monurol 3 gram packet 1 packet today. Repeat in 72 hrs.  Start: 05/13/2016  Discontinue: 08/04/2016  - Reason: The medication cycle was completed.  Uribel 118 mg-10 mg-40.8 mg-36 mg-0.12 mg capsule 1 capsule PO TID PRN  Start: 05/09/2016  Discontinue: 08/04/2016  - Reason: The medication cycle was completed.  Uribel 118 mg-10 mg-40.8 mg-36 mg-0.12 mg capsule 1 capsule PO TID PRN  Start: 04/18/2016  Discontinue: 08/04/2016  - Reason: The medication cycle was completed.            Schedule Return Visit/Planned Activity: Next Available Appointment - Schedule Surgery          Document Letter(s):  Created for Patient: Clinical Summary         Notes:   Bladder cancer-small recurrence. Discussed with  her the nature of risks benefits and alternatives to TURBT with postoperative epirubicin. All questions answered. She elects to proceed. I'll present her at tumor Board and discuss other intravesical options.   cc: Dr. Alen Blew;  Dr. Aurther Loft    * Signed by Festus Aloe, M.D. on 08/04/16 at 5:08 PM (EDT)*     The information contained in this medical record document is considered private and confidential patient information. This information can only be used for the medical diagnosis and/or medical services that are being provided by the patient's selected caregivers. This information can only be distributed outside of the patient's care if the patient agrees and signs waivers of authorization for this information to be sent to an outside source or route.

## 2016-08-25 NOTE — Telephone Encounter (Signed)
Need MRI of Lspine see what she says, I have discussed with her, send me a message back and I will order

## 2016-08-26 ENCOUNTER — Ambulatory Visit (HOSPITAL_COMMUNITY)
Admission: RE | Admit: 2016-08-26 | Discharge: 2016-08-26 | Disposition: A | Discharge status: Home / Self Care | Payer: Medicare Other | Source: Ambulatory Visit | Attending: Urology | Admitting: Urology

## 2016-08-26 ENCOUNTER — Encounter (HOSPITAL_COMMUNITY)
Admission: RE | Disposition: A | Discharge status: Home / Self Care | Payer: Self-pay | Source: Ambulatory Visit | Attending: Urology

## 2016-08-26 ENCOUNTER — Encounter (HOSPITAL_COMMUNITY): Payer: Self-pay | Admitting: Anesthesiology

## 2016-08-26 ENCOUNTER — Ambulatory Visit (HOSPITAL_COMMUNITY): Payer: Medicare Other | Admitting: Anesthesiology

## 2016-08-26 DIAGNOSIS — Z880 Allergy status to penicillin: Secondary | ICD-10-CM | POA: Insufficient documentation

## 2016-08-26 DIAGNOSIS — K219 Gastro-esophageal reflux disease without esophagitis: Secondary | ICD-10-CM | POA: Diagnosis not present

## 2016-08-26 DIAGNOSIS — Z833 Family history of diabetes mellitus: Secondary | ICD-10-CM | POA: Diagnosis not present

## 2016-08-26 DIAGNOSIS — J449 Chronic obstructive pulmonary disease, unspecified: Secondary | ICD-10-CM | POA: Diagnosis not present

## 2016-08-26 DIAGNOSIS — Z79899 Other long term (current) drug therapy: Secondary | ICD-10-CM | POA: Diagnosis not present

## 2016-08-26 DIAGNOSIS — I2510 Atherosclerotic heart disease of native coronary artery without angina pectoris: Secondary | ICD-10-CM | POA: Diagnosis not present

## 2016-08-26 DIAGNOSIS — Z7951 Long term (current) use of inhaled steroids: Secondary | ICD-10-CM | POA: Diagnosis not present

## 2016-08-26 DIAGNOSIS — I509 Heart failure, unspecified: Secondary | ICD-10-CM | POA: Diagnosis not present

## 2016-08-26 DIAGNOSIS — C674 Malignant neoplasm of posterior wall of bladder: Secondary | ICD-10-CM | POA: Diagnosis not present

## 2016-08-26 DIAGNOSIS — C679 Malignant neoplasm of bladder, unspecified: Secondary | ICD-10-CM | POA: Diagnosis not present

## 2016-08-26 DIAGNOSIS — I10 Essential (primary) hypertension: Secondary | ICD-10-CM | POA: Diagnosis not present

## 2016-08-26 DIAGNOSIS — N3030 Trigonitis without hematuria: Secondary | ICD-10-CM | POA: Diagnosis not present

## 2016-08-26 DIAGNOSIS — Z881 Allergy status to other antibiotic agents status: Secondary | ICD-10-CM | POA: Insufficient documentation

## 2016-08-26 DIAGNOSIS — N3020 Other chronic cystitis without hematuria: Secondary | ICD-10-CM | POA: Diagnosis not present

## 2016-08-26 DIAGNOSIS — N3090 Cystitis, unspecified without hematuria: Secondary | ICD-10-CM | POA: Diagnosis not present

## 2016-08-26 DIAGNOSIS — F17290 Nicotine dependence, other tobacco product, uncomplicated: Secondary | ICD-10-CM | POA: Insufficient documentation

## 2016-08-26 DIAGNOSIS — M1990 Unspecified osteoarthritis, unspecified site: Secondary | ICD-10-CM | POA: Diagnosis not present

## 2016-08-26 DIAGNOSIS — C672 Malignant neoplasm of lateral wall of bladder: Secondary | ICD-10-CM | POA: Diagnosis not present

## 2016-08-26 DIAGNOSIS — I110 Hypertensive heart disease with heart failure: Secondary | ICD-10-CM | POA: Insufficient documentation

## 2016-08-26 DIAGNOSIS — Z8249 Family history of ischemic heart disease and other diseases of the circulatory system: Secondary | ICD-10-CM | POA: Insufficient documentation

## 2016-08-26 DIAGNOSIS — I5022 Chronic systolic (congestive) heart failure: Secondary | ICD-10-CM | POA: Diagnosis not present

## 2016-08-26 HISTORY — PX: TRANSURETHRAL RESECTION OF BLADDER TUMOR WITH MITOMYCIN-C: SHX6459

## 2016-08-26 SURGERY — TRANSURETHRAL RESECTION OF BLADDER TUMOR WITH MITOMYCIN-C
Anesthesia: General

## 2016-08-26 MED ORDER — PROPOFOL 10 MG/ML IV BOLUS
INTRAVENOUS | Status: DC | PRN
  Administered 2016-08-26: 30 mg via INTRAVENOUS
  Administered 2016-08-26: 90 mg via INTRAVENOUS

## 2016-08-26 MED ORDER — LIDOCAINE 2% (20 MG/ML) 5 ML SYRINGE
INTRAMUSCULAR | Status: DC | PRN
  Administered 2016-08-26: 60 mg via INTRAVENOUS

## 2016-08-26 MED ORDER — ONDANSETRON HCL 4 MG/2ML IJ SOLN
INTRAMUSCULAR | Status: AC
  Filled 2016-08-26: qty 2

## 2016-08-26 MED ORDER — FENTANYL CITRATE (PF) 100 MCG/2ML IJ SOLN
INTRAMUSCULAR | Status: AC
  Filled 2016-08-26: qty 2

## 2016-08-26 MED ORDER — ONDANSETRON HCL 4 MG/2ML IJ SOLN: 4.0000 mg | Freq: Four times a day (QID) | INTRAMUSCULAR | Status: DC | PRN

## 2016-08-26 MED ORDER — LIDOCAINE 2% (20 MG/ML) 5 ML SYRINGE
INTRAMUSCULAR | Status: AC
  Filled 2016-08-26: qty 5

## 2016-08-26 MED ORDER — FENTANYL CITRATE (PF) 100 MCG/2ML IJ SOLN
INTRAMUSCULAR | Status: DC | PRN
  Administered 2016-08-26 (×4): 25 ug via INTRAVENOUS

## 2016-08-26 MED ORDER — DEXTROSE 5 % IV SOLN
2.0000 g | Freq: Once | INTRAVENOUS | Status: AC
  Administered 2016-08-26: 2 g via INTRAVENOUS
  Filled 2016-08-26 (×2): qty 2

## 2016-08-26 MED ORDER — SODIUM CHLORIDE 0.9 % IR SOLN
Status: DC | PRN
  Administered 2016-08-26: 9000 mL via INTRAVESICAL

## 2016-08-26 MED ORDER — DEXTROSE 5 % IV SOLN
2.0000 g | Freq: Once | INTRAVENOUS | Status: DC
  Filled 2016-08-26: qty 2

## 2016-08-26 MED ORDER — LACTATED RINGERS IV SOLN
INTRAVENOUS | Status: DC
  Administered 2016-08-26: 09:00:00 via INTRAVENOUS

## 2016-08-26 MED ORDER — OXYBUTYNIN CHLORIDE 5 MG PO TABS: 5.0000 mg | ORAL_TABLET | Freq: Every day | ORAL | 11 refills | Status: DC

## 2016-08-26 MED ORDER — SODIUM CHLORIDE 0.9 % IV SOLN
50.0000 mg | Freq: Once | INTRAVENOUS | Status: AC
  Administered 2016-08-26: 50 mg via INTRAVESICAL
  Filled 2016-08-26: qty 25

## 2016-08-26 MED ORDER — FENTANYL CITRATE (PF) 100 MCG/2ML IJ SOLN
25.0000 ug | INTRAMUSCULAR | Status: DC | PRN
  Administered 2016-08-26 (×3): 50 ug via INTRAVENOUS

## 2016-08-26 MED ORDER — PROPOFOL 10 MG/ML IV BOLUS
INTRAVENOUS | Status: AC
  Filled 2016-08-26: qty 20

## 2016-08-26 SURGICAL SUPPLY — 15 items
BAG URINE DRAINAGE (UROLOGICAL SUPPLIES) ×2 IMPLANT
BAG URO CATCHER STRL LF (MISCELLANEOUS) ×3 IMPLANT
CATH FOLEY 2WAY SLVR  5CC 16FR (CATHETERS) ×2
CATH FOLEY 2WAY SLVR 5CC 16FR (CATHETERS) IMPLANT
COVER SURGICAL LIGHT HANDLE (MISCELLANEOUS) ×1 IMPLANT
GLOVE BIOGEL M STRL SZ7.5 (GLOVE) ×3 IMPLANT
GOWN STRL REUS W/TWL XL LVL3 (GOWN DISPOSABLE) ×3 IMPLANT
HOLDER FOLEY CATH W/STRAP (MISCELLANEOUS) ×2 IMPLANT
LOOP CUT BIPOLAR 24F LRG (ELECTROSURGICAL) ×2 IMPLANT
MANIFOLD NEPTUNE II (INSTRUMENTS) ×3 IMPLANT
NS IRRIG 1000ML POUR BTL (IV SOLUTION) ×2 IMPLANT
PACK CYSTO (CUSTOM PROCEDURE TRAY) ×3 IMPLANT
TUBING CONNECTING 10 (TUBING) ×2 IMPLANT
TUBING CONNECTING 10' (TUBING) ×1
WATER STERILE IRR 500ML POUR (IV SOLUTION) ×2 IMPLANT

## 2016-08-26 NOTE — Telephone Encounter (Signed)
MRI ordered

## 2016-08-26 NOTE — Op Note (Addendum)
Preoperative diagnosis: Bladder cancer overlapping sites Postoperative diagnosis: Same  Procedure: Cystoscopy, TURBT 2-5 cm, installation of epirubicin in PACU  Surgeon: Junious Silk  Anesthesia: Gen.  Indication for procedure: 83 year old with recurrent high-grade TA bladder cancer.  Findings: On exam under anesthesia the bladder and urethra are palpably normal. No bladder masses are palpable. The cervix is palpably normal.  On cystoscopy there is a cluster of tumors right posterior wall about 3 cm, a small patch at the dome, a 1 cm area left anterior. All of these tumors seem to superficial and scraped off. The ureteral orifices are widely patent and possibly refluxing. They are laterally displaced.  Description of procedure: After adequate anesthesia the patient was placed in lithotomy position and prepped and draped in the usual sterile fashion. A timeout was performed from the patient and procedure. An exam under anesthesia was performed. The cystoscope was passed per urethra and the bladder carefully inspected. The the cold cup biopsy some erythema and edematous mucosa posteriorly. I then placed the visual obturator with the continuous-flow sheath and then the loop to fulgurate these biopsy sites. I then scraped off and fulgurated a small tumor at the dome and a small tumor at the left anterior. I then resected the right posterior tumor - this was about 3 cm in width. A prior biopsy site had a small stone on at the left lateral superior location. This was pulled off and the base lightly fulgurated. Hemostasis was excellent at low-pressure. The bladder was filled and the scope removed. A 16 French Foley was placed and left gravity drainage. She was awakened and taken to the PACU in stable condition  Epirubicin in PACU: The urine was clear. 50 mg of epirubicin in 50 mL was instilled per urethra and the Foley capped. It will be left for 50 minutes.   Specimens to pathology: #1 left posterior  bladder biopsy (erythematous mucosa) #2 posterior bladder biopsy (edematous mucosa) #3 right bladder tumor resection  Blood loss: Minimal  Complications: None  Drains: 16 French Foley catheter

## 2016-08-26 NOTE — Transfer of Care (Signed)
Immediate Anesthesia Transfer of Care Note  Patient: Amber Snow  Procedure(s) Performed: Procedure(s): CYSTOSCOPY TRANSURETHRAL RESECTION OF BLADDER TUMOR WITH EPIRUBACIN (N/A)  Patient Location: PACU  Anesthesia Type:General  Level of Consciousness: awake and alert   Airway & Oxygen Therapy: Patient Spontanous Breathing and Patient connected to face mask oxygen  Post-op Assessment: Report given to RN and Post -op Vital signs reviewed and stable  Post vital signs: Reviewed and stable  Last Vitals:  Vitals:   08/26/16 0810  BP: (!) 142/54  Pulse: 83  Resp: 16  Temp: 36.7 C    Last Pain:  Vitals:   08/26/16 0810  TempSrc: Oral         Complications: No apparent anesthesia complications

## 2016-08-26 NOTE — Anesthesia Procedure Notes (Signed)
Procedure Name: LMA Insertion Date/Time: 08/26/2016 10:17 AM Performed by: Danley Danker L Patient Re-evaluated:Patient Re-evaluated prior to inductionOxygen Delivery Method: Circle system utilized Preoxygenation: Pre-oxygenation with 100% oxygen Intubation Type: IV induction Ventilation: Mask ventilation without difficulty LMA: LMA inserted LMA Size: 4.0 Number of attempts: 1 Placement Confirmation: positive ETCO2 Tube secured with: Tape Dental Injury: Teeth and Oropharynx as per pre-operative assessment

## 2016-08-26 NOTE — Discharge Instructions (Signed)
Cystoscopy, Care After  Refer to this sheet in the next few weeks. These instructions provide you with information about caring for yourself after your procedure. Your health care provider may also give you more specific instructions. Your treatment has been planned according to current medical practices, but problems sometimes occur. Call your health care provider if you have any problems or questions after your procedure.  What can I expect after the procedure?  After the procedure, it is common to have:   Mild pain when you urinate. Pain should stop within a few minutes after you urinate. This may last for up to 1 week.   A small amount of blood in your urine for several days.   Feeling like you need to urinate but producing only a small amount of urine.    Follow these instructions at home:       Medicines    Take over-the-counter and prescription medicines only as told by your health care provider.   If you were prescribed an antibiotic medicine, take it as told by your health care provider. Do not stop taking the antibiotic even if you start to feel better.  General instructions        Return to your normal activities as told by your health care provider. Ask your health care provider what activities are safe for you.   Do not drive for 24 hours if you received a sedative.   Watch for any blood in your urine. If the amount of blood in your urine increases, call your health care provider.   Follow instructions from your health care provider about eating or drinking restrictions.   If a tissue sample was removed for testing (biopsy) during your procedure, it is your responsibility to get your test results. Ask your health care provider or the department performing the test when your results will be ready.   Drink enough fluid to keep your urine clear or pale yellow.   Keep all follow-up visits as told by your health care provider. This is important.  Contact a health care provider if:   You have  pain that gets worse or does not get better with medicine, especially pain when you urinate.   You have difficulty urinating.  Get help right away if:   You have more blood in your urine.   You have blood clots in your urine.   You have abdominal pain.   You have a fever or chills.   You are unable to urinate.  This information is not intended to replace advice given to you by your health care provider. Make sure you discuss any questions you have with your health care provider.  Document Released: 11/01/2004 Document Revised: 09/20/2015 Document Reviewed: 03/01/2015  Elsevier Interactive Patient Education  2017 [employer]

## 2016-08-26 NOTE — Progress Notes (Signed)
Bilateral hearing aid  intact preprocedure and noted to CRNA D. Reardon. (okay to leave in).

## 2016-08-26 NOTE — Anesthesia Preprocedure Evaluation (Signed)
Anesthesia Evaluation  Patient identified by MRN, date of birth, ID band Patient awake    Reviewed: Allergy & Precautions, H&P , NPO status , Patient's Chart, lab work & pertinent test results  Airway Mallampati: II   Neck ROM: full    Dental   Pulmonary shortness of breath, pneumonia, resolved, COPD, former smoker,    breath sounds clear to auscultation       Cardiovascular hypertension, + Peripheral Vascular Disease and +CHF   Rhythm:regular Rate:Normal  LBBB.  FKC(1275): EF 35-40%   Neuro/Psych  Neuromuscular disease    GI/Hepatic GERD  ,  Endo/Other    Renal/GU stones     Musculoskeletal  (+) Arthritis ,   Abdominal   Peds  Hematology   Anesthesia Other Findings   Reproductive/Obstetrics                             Anesthesia Physical Anesthesia Plan  ASA: III  Anesthesia Plan: General   Post-op Pain Management:    Induction: Intravenous  Airway Management Planned: LMA  Additional Equipment:   Intra-op Plan:   Post-operative Plan:   Informed Consent: I have reviewed the patients History and Physical, chart, labs and discussed the procedure including the risks, benefits and alternatives for the proposed anesthesia with the patient or authorized representative who has indicated his/her understanding and acceptance.     Plan Discussed with: CRNA, Anesthesiologist and Surgeon  Anesthesia Plan Comments:         Anesthesia Quick Evaluation

## 2016-08-26 NOTE — Interval H&P Note (Signed)
History and Physical Interval Note:  08/26/2016 10:04 AM  Amber Snow  has presented today for surgery. I should add she's having more nocturia and feeling of incomplete emptying. No fever, hematuria or dysuria. Discussed nature r/b/a to trial of anticholinergics and she elects to proceed.    [name]

## 2016-08-26 NOTE — Interval H&P Note (Signed)
History and Physical Interval Note:  08/26/2016 10:04 AM  Amber Snow  has presented today for surgery, with the diagnosis of BLADDER CANCER  The various methods of treatment have been discussed with the patient and family. After consideration of risks, benefits and other options for treatment, the patient has consented to  Procedure(s): [place] (N/A) as a surgical intervention .  The patient's history has been reviewed, patient examined, no change in status, stable for surgery.  I have reviewed the patient's chart and labs.  Questions were answered to the patient's satisfaction.     Domenico Achord

## 2016-08-26 NOTE — Anesthesia Postprocedure Evaluation (Signed)
Anesthesia Post Note  Patient: Amber Snow  Procedure(s) Performed: Procedure(s) (LRB): CYSTOSCOPY TRANSURETHRAL RESECTION OF BLADDER TUMOR WITH EPIRUBACIN (N/A)  Patient location during evaluation: PACU Anesthesia Type: General Level of consciousness: awake and alert and patient cooperative Pain management: pain level controlled Vital Signs Assessment: post-procedure vital signs reviewed and stable Respiratory status: spontaneous breathing and respiratory function stable Cardiovascular status: stable Anesthetic complications: no       Last Vitals:  Vitals:   08/26/16 1253 08/26/16 1330  BP: (!) 114/46 (!) 126/52  Pulse: 72 66  Resp: 18 18  Temp: 36.5 C 36.7 C    Last Pain:  Vitals:   08/26/16 1330  TempSrc: Oral  PainSc:                  [place]

## 2016-09-02 DIAGNOSIS — R8279 Other abnormal findings on microbiological examination of urine: Secondary | ICD-10-CM | POA: Diagnosis not present

## 2016-09-02 DIAGNOSIS — R300 Dysuria: Secondary | ICD-10-CM | POA: Diagnosis not present

## 2016-09-03 ENCOUNTER — Encounter: Payer: Self-pay | Admitting: *Deleted

## 2016-09-10 ENCOUNTER — Ambulatory Visit
Admission: RE | Admit: 2016-09-10 | Discharge: 2016-09-10 | Disposition: A | Discharge status: Home / Self Care | Payer: Medicare Other | Source: Ambulatory Visit | Attending: Physical Medicine and Rehabilitation | Admitting: Physical Medicine and Rehabilitation

## 2016-09-10 ENCOUNTER — Encounter: Payer: Self-pay | Admitting: *Deleted

## 2016-09-10 DIAGNOSIS — G8929 Other chronic pain: Secondary | ICD-10-CM

## 2016-09-10 DIAGNOSIS — M4807 Spinal stenosis, lumbosacral region: Secondary | ICD-10-CM | POA: Diagnosis not present

## 2016-09-10 DIAGNOSIS — M5441 Lumbago with sciatica, right side: Principal | ICD-10-CM

## 2016-09-10 DIAGNOSIS — M5442 Lumbago with sciatica, left side: Principal | ICD-10-CM

## 2016-09-12 DIAGNOSIS — H903 Sensorineural hearing loss, bilateral: Secondary | ICD-10-CM | POA: Diagnosis not present

## 2016-09-12 DIAGNOSIS — R350 Frequency of micturition: Secondary | ICD-10-CM | POA: Diagnosis not present

## 2016-09-15 ENCOUNTER — Telehealth (INDEPENDENT_AMBULATORY_CARE_PROVIDER_SITE_OTHER): Payer: Self-pay | Admitting: Physical Medicine and Rehabilitation

## 2016-09-15 ENCOUNTER — Encounter: Payer: Self-pay | Admitting: *Deleted

## 2016-09-15 NOTE — Telephone Encounter (Signed)
Scheduled for 5/23 at 1015.

## 2016-09-15 NOTE — Telephone Encounter (Signed)
Trigger point with OV for MRI, was she not scheduled for review?

## 2016-09-17 ENCOUNTER — Encounter (INDEPENDENT_AMBULATORY_CARE_PROVIDER_SITE_OTHER): Payer: Self-pay | Admitting: Physical Medicine and Rehabilitation

## 2016-09-17 ENCOUNTER — Ambulatory Visit (INDEPENDENT_AMBULATORY_CARE_PROVIDER_SITE_OTHER): Payer: Medicare Other | Admitting: Physical Medicine and Rehabilitation

## 2016-09-17 VITALS — BP 106/55 | HR 95

## 2016-09-17 DIAGNOSIS — G8929 Other chronic pain: Secondary | ICD-10-CM

## 2016-09-17 DIAGNOSIS — M609 Myositis, unspecified: Secondary | ICD-10-CM

## 2016-09-17 DIAGNOSIS — M542 Cervicalgia: Secondary | ICD-10-CM

## 2016-09-17 DIAGNOSIS — M5441 Lumbago with sciatica, right side: Secondary | ICD-10-CM | POA: Diagnosis not present

## 2016-09-17 DIAGNOSIS — M546 Pain in thoracic spine: Secondary | ICD-10-CM | POA: Diagnosis not present

## 2016-09-17 DIAGNOSIS — G894 Chronic pain syndrome: Secondary | ICD-10-CM | POA: Diagnosis not present

## 2016-09-17 DIAGNOSIS — M5442 Lumbago with sciatica, left side: Secondary | ICD-10-CM

## 2016-09-17 NOTE — Progress Notes (Signed)
MRI review. No changes with lower back pain.  Pain mostly on right side of neck going into head. She says pain goes all the way up to the top of her head.

## 2016-09-19 ENCOUNTER — Encounter (INDEPENDENT_AMBULATORY_CARE_PROVIDER_SITE_OTHER): Payer: Self-pay | Admitting: Physical Medicine and Rehabilitation

## 2016-09-19 DIAGNOSIS — G894 Chronic pain syndrome: Secondary | ICD-10-CM | POA: Diagnosis not present

## 2016-09-19 DIAGNOSIS — M609 Myositis, unspecified: Secondary | ICD-10-CM

## 2016-09-19 DIAGNOSIS — M542 Cervicalgia: Secondary | ICD-10-CM

## 2016-09-19 MED ORDER — LIDOCAINE HCL 1 % IJ SOLN
3.0000 mL | INTRAMUSCULAR | Status: AC | PRN
  Administered 2016-09-19: 3 mL

## 2016-09-19 MED ORDER — TRIAMCINOLONE ACETONIDE 40 MG/ML IJ SUSP
20.0000 mg | INTRAMUSCULAR | Status: AC | PRN
  Administered 2016-09-19: 20 mg via INTRAMUSCULAR

## 2016-09-19 NOTE — Progress Notes (Signed)
Amber Snow - 83 y.o. female MRN [id number]  Date of birth: 01-Jun-1932  Office Visit Note: Visit Date: 09/17/2016 PCP: Amber Anger, MD Referred by: Amber Anger, MD  Subjective: Chief Complaint  Patient presents with  . Neck - Pain   HPI: [name] is a very pleasant 83 year old female that we know quite well who unfortunately has a complicated case of increased worsening neck and upper back pain along with pain that radiates up into the posterior occiput. Most recently she's been having more increased lower back pain which has been mostly axial with some referral to the hips. We did obtain an MRI of the lumbar spine recently and this is reviewed below and reviewed with her today. It basically shows degenerative spine with facet arthropathy and degenerative disc but without much in the way of central stenosis or nerve compression. Overall her cervical spine is much worse than her lumbar spine. Her biggest complaint today is the cervical pain. Most recently we completed trigger point injections which always helped her just at times a very short-lived. She has had physical therapy and dry needling as well as medication management and unfortunately just chronic pain at this point that comes and goes with severity. Her case is complicated by concomitant bladder cancer, severe COPD and chronic smoking. She also appears to be losing more weight every time I see her. Today she tells me that she's been doing okay but her neck pain is really problematic and is radiating up into the occiput. Her biggest complaint is the radiating pain from the upper trapezius area to the lower occiput. She has no pain across the head and no lancinating pain. She doesn't really consider this headache. She does get pain with movement and rotation. She gets some movement into the upper back. Prior MRI of the cervical spine show disc herniation at T1-2 as well as multilevel facet arthropathy. Last epidural  injection was only mildly beneficial. Prior to that epidural injection was very beneficial. She denies any symptoms down the arms or numbness tingling or paresthesias. She's had no focal weakness otherwise.    Review of Systems  Constitutional: Negative for chills, fever, malaise/fatigue and weight loss.  HENT: Negative for hearing loss and sinus pain.   Eyes: Negative for blurred vision, double vision and photophobia.  Respiratory: Negative for cough and shortness of breath.   Cardiovascular: Negative for chest pain, palpitations and leg swelling.  Gastrointestinal: Negative for abdominal pain, nausea and vomiting.  Genitourinary: Negative for flank pain.  Musculoskeletal: Positive for back pain and neck pain. Negative for myalgias.  Skin: Negative for itching and rash.  Neurological: Negative for tremors, focal weakness and weakness.  Endo/Heme/Allergies: Negative.   Psychiatric/Behavioral: Negative for depression.  All other systems reviewed and are negative.  Otherwise per HPI.  Assessment & Plan: Visit Diagnoses:  1. Cervicalgia   2. Myofascitis   3. Chronic midline thoracic back pain   4. Chronic bilateral low back pain with bilateral sciatica   5. Chronic pain syndrome     Plan: Findings:  Chronic worsening neck pain and myofascial pain with pain referring up the posterior neck to the occiput of the head. She has focal trigger points in the levator scapula and trapezius and probably posterior scalene muscles that do reproduce a lot of her symptoms. They seem to be very active on the right more than left. We are going today to go ahead and complete trigger point injections here again. Depending on the  relief she gets I would probably look at repeating cervical epidural injection. We've discussed again resting her neck and head at times during the day. We stressed the importance of some of the exercises we've given her. Unfortunately this is ongoing problem with her anxiety and  myofascial pain. Combination with a degenerative neck. She continues to use muscle relaxers.  Secondarily is her low back pain which is really not as big issue for her right now. Her lumbar spine MRI did show degenerative facet arthropathy and degenerative disc type changes but without much in the way of nerve compression or disc herniation or stenosis. Will hold off right now looking at that's but potentially could look at facet joint blocks diagnostically. I spent more than 25 minutes speaking face-to-face with the patient with 50% of the time in counseling.    Meds & Orders: No orders of the defined types were placed in this encounter.   Orders Placed This Encounter  Procedures  . Trigger Point Injection    Follow-up: Return if symptoms worsen or fail to improve.   Procedures: Trigger Point Inj Date/Time: 09/19/2016 4:48 AM Performed by: Amber Snow Authorized by: Amber Snow   Consent Given by:  Patient Site marked: the procedure site was marked   Timeout: prior to procedure the correct patient, procedure, and site was verified   Total # of Trigger Points:  3 or more Location: neck and back   Needle Size:  25 G Approach:  Dorsal Medications #1:  20 mg triamcinolone acetonide 40 MG/ML Medications #2:  3 mL lidocaine 1 % Additional Injections?: No   Patient tolerance:  Patient tolerated the procedure well with no immediate complications Comments: Trigger points were palpated in the right scalene, levator scapula and likely trapezius/supraspinatus. These trigger points were injected using a needling technique.    No notes on file   Clinical History: Lumbar spine MRI 09/10/2016  IMPRESSION: Advanced multilevel degenerative change throughout the lumbar spine with disc space narrowing and diffuse spurring. Reversal of the lumbar lordosis.  Left foraminal and extraforaminal osteophyte at L3-4 displacing the left L3 nerve root. Subarticular stenosis on the  left  Left subarticular stenosis L4-5 due to spurring and ligament flavum hypertrophy. Mild foraminal narrowing bilaterally  Mild subarticular and foraminal stenosis bilaterally L5-S1 due to spurring.   Cervical spine MRI 08/19/2015  IMPRESSION: 1. Progressed retrolisthesis at C5-C6 and disc degeneration at C3-C4 and C6-C7 since 2014. Subsequent increased mild spinal stenosis at C3-C4 with no spinal cord mass effect, and increased mild to moderate left C6 neural foraminal stenosis. 2. New or increased T1-T2 disc herniation with new mild spinal stenosis. 3. Stable degenerative moderate to severe neural foraminal stenosis at the bilateral C4 and C5 nerve levels.  She reports that she quit smoking about 4 years ago. Her smoking use included Cigarettes. She has a 50.00 pack-year smoking history. She has never used smokeless tobacco. No results for input(s): HGBA1C, LABURIC in the last 8760 hours.  Objective:  VS:  HT:    WT:   BMI:     BP:(!) 106/55  HR:95bpm  TEMP: ( )  RESP:  Physical Exam  Ortho Exam Imaging: No results found.  Past Medical/Family/Surgical/Social History: Medications & Allergies reviewed per EMR Patient Active Problem List   Diagnosis Date Noted  . Weight loss 06/26/2016  . Cervical disc disorder with radiculopathy 12/15/2015  . Cervical spondylosis with myelopathy 12/15/2015  . COPD exacerbation ([place]) 11/10/2015  . Essential hypertension 11/10/2015  . Oral candidiasis  08/09/2015  . Weakness 02/21/2015  . Edema 11/08/2014  . Acute bronchitis 07/17/2014  . Cellulitis of leg, left 12/28/2013  . Laceration of leg 12/15/2013  . CAD (coronary artery disease) 09/02/2013  . Well adult exam 07/06/2013  . Dizzy 05/10/2013  . Chronic systolic CHF (congestive heart failure) ([place]) 12/30/2012  . Sepsis ([place]) 09/16/2012  . CAP (community acquired pneumonia) 09/16/2012  . Nonischemic cardiomyopathy ([place]) 06/02/2012  . Lung nodule 06/01/2012  . Bladder  cancer ([place]) 12/17/2011  . LV dysfunction 09/26/2011  . Dyspnea 08/13/2011  . PAD (peripheral artery disease) ([place]) 08/13/2011  . Murmur 08/13/2011  . Other dysphagia 09/09/2010  . LOW BACK PAIN, CHRONIC 10/17/2009  . Low back pain radiating to both legs 10/17/2009  . THYROID FUNCTION TEST, ABNORMAL 07/19/2009  . LEUKOCYTOSIS 07/02/2009  . Nonspecific (abnormal) findings on radiological and other examination of body structure 07/02/2009  . Fatigue 06/29/2009  . Vitamin D deficiency 11/20/2008  . ARTHRALGIA 11/20/2008  . UNSPECIFIED MYALGIA AND MYOSITIS 11/20/2008  . OTH SYMPTOMS INVLV NERV&MUSCULOSKELETAL SYSTEMS 03/15/2008  . LBBB (left bundle branch block) 02/14/2008  . OSTEOPENIA 11/12/2007  . RENAL CALCULUS 08/16/2007  . DIVERTICULOSIS, COLON 12/25/2006  . HYPERLIPIDEMIA 10/02/2006  . COPD GOLD II  10/02/2006   Past Medical History:  Diagnosis Date  . Arthritis HANDS  . Bladder calculus   . Bladder cancer ([place])    Chemo last 10'17. -[facility]  . Chronic systolic CHF (congestive heart failure) ([place])   . Diverticulosis of colon   . Dyspnea    with exertion only- intermittent cough  . Dyspnea on exertion   . GERD (gastroesophageal reflux disease)   . Hearing impaired person, bilateral    wears hearing aids bilateral  . History of kidney stones   . History of nephrolithiasis   . Hyperlipidemia   . LBBB (left bundle branch block)    CHRONIC  . Myalgia   . Neck pain    07-30-15 at present and flares periodically"? stenosis"  . Nocturia   . Nodule of right lung    APICAL 1.0CM (MONITORED BY DR Melvyn Novas)  . Nonischemic cardiomyopathy ([place])   . Osteopenia   . PAC (premature atrial contraction)   . Pneumonia 1-2 YEARS AGO  . Wears hearing aid    BILATERAL   Family History  Problem Relation Age of Onset  . Heart disease Mother   . Hypertension Mother   . Hypertension Father   . Heart attack Father   . Heart disease Father   . Thyroid disease Sister   . Heart attack  Sister   . Stroke Sister   . COPD Sister   . Heart attack Brother   . Breast cancer Maternal Grandmother   . Diabetes Paternal Grandmother   . Asthma Neg Hx    Past Surgical History:  Procedure Laterality Date  . CARDIAC CATHETERIZATION  09-12-2011  DR Martinique   MINIMAL NONOBSTRUCTIVE CAD/ MODERATE TO SEVERE LV DYSFUNCTION/  NORMAL RIGHT HEART PRESSURES AND LV FILLING PRESSURES  . CATARACT EXTRACTION W/ INTRAOCULAR LENS  IMPLANT, BILATERAL  1998  . COSMETIC SURGERY     eyelids  . CYSTO/ LEFT RETROGRADE PYELOGRAM/ LEFT URETERAL STENT PLACEMENT  10-16-2001   STONE  . CYSTOSCOPY  07/01/2011   Procedure: CYSTOSCOPY;  Surgeon: Hanley Ben, MD;  Location: [facility];  Service: Urology;  Laterality: N/A;  retrieval l of bladder stone  . CYSTOSCOPY N/A 08/20/2012   Procedure: CYSTOSCOPY;  Surgeon: Hanley Ben, MD;  Location:  [place];  Service: Urology;  Laterality: N/A;  fulgeration of bladderm tumors  . CYSTOSCOPY N/A 06/23/2014   Procedure: CYSTOSCOPY;  Surgeon: Arvil Persons, MD;  Location: [facility];  Service: Urology;  Laterality: N/A;  . CYSTOSCOPY W/ RETROGRADES Right 04/01/2013   Procedure: CYSTOSCOPY WITH RETROGRADE PYELOGRAM;  Surgeon: Hanley Ben, MD;  Location: [place];  Service: Urology;  Laterality: Right;  . CYSTOSCOPY WITH BIOPSY N/A 02/06/2015   Procedure: CYSTO WITH BLADDER BIOPSY;  Surgeon: Festus Aloe, MD;  Location: [facility];  Service: Urology;  Laterality: N/A;  . CYSTOSCOPY WITH BIOPSY N/A 04/04/2016   Procedure: CYSTOSCOPY WITH BIOPSY AND FULGURATION EPIRUBICIN BLADDER INSTILLATION;  Surgeon: Festus Aloe, MD;  Location: [facility];  Service: Urology;  Laterality: N/A;  . CYSTOSCOPY WITH FULGERATION N/A 11/27/2015   Procedure: CYSTOSCOPY WITH FULGERATION BLADDER BIOPSY ;  Surgeon: Festus Aloe, MD;  Location: [facility];  Service: Urology;  Laterality: N/A;  . CYSTOSCOPY WITH LITHOLAPAXY N/A  04/01/2013   Procedure: CYSTOSCOPY WITH HOLMIUM LASER OF  BLADDER CALCULUS;  Surgeon: Hanley Ben, MD;  Location: [place];  Service: Urology;  Laterality: N/A;  . CYSTOSCOPY WITH RETROGRADE PYELOGRAM, URETEROSCOPY AND STENT PLACEMENT Bilateral 07/31/2015   Procedure: CYSTOSCOPY WITH FULGERATION BLADDER BIOPSY, CYSTOSCOPY WITH BILATERAL RETROGRADE PYELOGRAMS;  Surgeon: Festus Aloe, MD;  Location: [facility];  Service: Urology;  Laterality: Bilateral;  . EXTRACORPOREAL SHOCK WAVE LITHOTRIPSY  2003  . FULGURATION OF BLADDER TUMOR  06/23/2014   Procedure: FULGURATION OF BLADDER TUMOR;  Surgeon: Arvil Persons, MD;  Location: [facility];  Service: Urology;;  . FULGURATION OF BLADDER TUMOR N/A 02/06/2015   Procedure: FULGURATION OF BLADDER TUMORS;  Surgeon: Festus Aloe, MD;  Location: [facility];  Service: Urology;  Laterality: N/A;  . HOLMIUM LASER APPLICATION N/A 54/09/2701   Procedure: HOLMIUM LASER APPLICATION;  Surgeon: Hanley Ben, MD;  Location: [place];  Service: Urology;  Laterality: N/A;  . LEFT AND RIGHT HEART CATHETERIZATION WITH CORONARY ANGIOGRAM Bilateral 09/12/2011   Procedure: LEFT AND RIGHT HEART CATHETERIZATION WITH CORONARY ANGIOGRAM;  Surgeon: Peter M Martinique, MD;  Location: [facility];  Service: Cardiovascular;  Laterality: Bilateral;  . LEFT LONG FINGER ARTHROPLASTY & PULLEY RELASE  02-17-2005  . ORIF FINGER / THUMB FRACTURE    . PULMONARY FUNCTION ANALYSIS  07-31-2009   MODERATE TO SEVERE OBSTRUCTIVE AIRWAY DISEASE/ INSIGNIFICANT RESPONSE TO BRONCHODILATORS/ EMPHYSEMA PATTERN/ MILD DECREASE IN DIFFUSE CAPACITY FEF 25-75 CHANGED BY 8%  . TONSILLECTOMY  CHILD  . TOOTH EXTRACTION  01-17-15   LOWER TOOTH CROWN  BROKE OFF AND WAS EXTRACTED  . TRANSTHORACIC ECHOCARDIOGRAM  last one 03-15-2014  DR Martinique   GRADE I DIASTOLIC DYSFUNCTION/  confirmed by dr Liane Comber  EF 45% (down from 60-65% 03-12-2013 but up from 25-30% compared to  last echo 10-01-2012 /  NORMAL LV SIZE AND WALL THICKNESS/  trivial TR  . TRANSURETHRAL RESECTION OF BLADDER TUMOR  07/01/2011   Procedure: TRANSURETHRAL RESECTION OF BLADDER TUMOR (TURBT);  Surgeon: Hanley Ben, MD;  Location: [facility];  Service: Urology;  Laterality: N/A;  CYSTO  . TRANSURETHRAL RESECTION OF BLADDER TUMOR N/A 02/06/2015   Procedure:  TRANSURETHRAL RESECTION OF BLADDER TUMOR (TURBT) GREATER THAN 5 CM; RIGHT RETROGRADE, RIGHT STENT PLACEMENT;  Surgeon: Festus Aloe, MD;  Location: [facility];  Service: Urology;  Laterality: N/A;  . TRANSURETHRAL RESECTION OF BLADDER TUMOR N/A 07/31/2015   Procedure:  TRANSURETHRAL RESECTION OF BLADDER  TUMOR (TURBT); REMOVAL OF DYSTROPHIC CALCIFICATIONS;  Surgeon: Festus Aloe, MD;  Location[facility];  Service: Urology;  Laterality: N/A;  . TRANSURETHRAL RESECTION OF BLADDER TUMOR N/A 11/27/2015   Procedure: TRANSURETHRAL RESECTION OF BLADDER TUMOR (TURBT);  Surgeon: Festus Aloe, MD;  Location: [facility];  Service: Urology;  Laterality: N/A;  . TRANSURETHRAL RESECTION OF BLADDER TUMOR WITH MITOMYCIN-C N/A 08/26/2016   Procedure: CYSTOSCOPY TRANSURETHRAL RESECTION OF BLADDER TUMOR WITH EPIRUBACIN;  Surgeon: Festus Aloe, MD;  Location: [facility];  Service: Urology;  Laterality: N/A;  . TUBAL LIGATION  AGE 47'S   Social History   Occupational History  . Retired     Government social research officer Asst   Social History Main Topics  . Smoking status: Former Smoker    Packs/day: 1.00    Years: 50.00    Types: Cigarettes    Quit date: 09/27/2011  . Smokeless tobacco: Never Used  . Alcohol use Yes     Comment: wine rarely  . Drug use: No  . Sexual activity: Not Currently

## 2016-09-23 ENCOUNTER — Telehealth (INDEPENDENT_AMBULATORY_CARE_PROVIDER_SITE_OTHER): Payer: Self-pay | Admitting: Physical Medicine and Rehabilitation

## 2016-09-23 NOTE — Telephone Encounter (Signed)
Called patient to advise. She will call us if pain becomes a problem and she wishes to proceed with injections.

## 2016-09-23 NOTE — Telephone Encounter (Signed)
I printed her report, tell her sorry we did not go over, I got caught up with her neck pain and Lspine MRI not much problem compared to neck, multilevel facet joint OA but no stenosis which is what I was worrying about. No tumor etc. We could do facet injections if painful

## 2016-09-26 ENCOUNTER — Encounter: Payer: Self-pay | Admitting: Internal Medicine

## 2016-09-26 ENCOUNTER — Ambulatory Visit (INDEPENDENT_AMBULATORY_CARE_PROVIDER_SITE_OTHER): Payer: Medicare Other | Admitting: Internal Medicine

## 2016-09-26 VITALS — BP 128/60 | HR 80 | Temp 98.0°F | Ht 63.0 in | Wt 91.6 lb

## 2016-09-26 DIAGNOSIS — M545 Low back pain: Secondary | ICD-10-CM

## 2016-09-26 DIAGNOSIS — M79604 Pain in right leg: Secondary | ICD-10-CM

## 2016-09-26 DIAGNOSIS — C670 Malignant neoplasm of trigone of bladder: Secondary | ICD-10-CM

## 2016-09-26 DIAGNOSIS — R252 Cramp and spasm: Secondary | ICD-10-CM | POA: Diagnosis not present

## 2016-09-26 DIAGNOSIS — E785 Hyperlipidemia, unspecified: Secondary | ICD-10-CM

## 2016-09-26 DIAGNOSIS — Z0000 Encounter for general adult medical examination without abnormal findings: Secondary | ICD-10-CM | POA: Diagnosis not present

## 2016-09-26 NOTE — Assessment & Plan Note (Addendum)
Here for medicare wellness/physical  Diet: heart healthy  Physical activity: not sedentary  Depression/mood screen: negative  Hearing: intact to whispered voice w/hearing aids Visual acuity: grossly normal w/glasses, performs annual eye exam  ADLs: capable  Fall risk: low  Home safety: good  Cognitive evaluation: intact to orientation, naming, recall and repetition  EOL planning: adv directives, full code/ I agree  I have personally reviewed and have noted  1. The patient's medical, surgical and social history  2. Their use of alcohol, tobacco or illicit drugs  3. Their current medications and supplements  4. The patient's functional ability including ADL's, fall risks, home safety risks and hearing or visual impairment.  5. Diet and physical activities  6. Evidence for depression or mood disorders 7. The roster of all physicians providing medical care to patient - is listed in the Snapshot section of the chart and reviewed today.    Today patient counseled on age appropriate routine health concerns for screening and prevention, each reviewed and up to date or declined. Immunizations reviewed and up to date or declined. Labs ordered and reviewed. Risk factors for depression reviewed and negative. Hearing function and visual acuity are intact. ADLs screened and addressed as needed. Functional ability and level of safety reviewed and appropriate. Education, counseling and referrals performed based on assessed risks today. Patient provided with a copy of personalized plan for preventive services.   Moving to [place], [place] this summer Labs

## 2016-09-26 NOTE — Progress Notes (Signed)
Subjective:  Patient ID: Amber Snow, female    DOB: Apr 18, 1933  Age: 83 y.o. MRN: [id number]  CC: Annual Exam   HPI Amber Snow presents for a well exam. C/o LBP - had an MRI. Moving to [place], [place].  Outpatient Medications Prior to Visit  Medication Sig Dispense Refill  . acetaminophen (TYLENOL) 500 MG tablet Take 1,000 mg by mouth every 6 (six) hours as needed for moderate pain or headache.    . ALPRAZolam (XANAX) 0.25 MG tablet TAKE 1/2 TO 1 TABLET BY MOUTH TWICE A DAY AS NEEDED FOR ANXIETY (SHORTNESS OF BREATH) 60 tablet 2  . baclofen (LIORESAL) 10 MG tablet Take 10 mg by mouth 3 (three) times daily as needed for muscle spasms.     Marland Kitchen BIOTIN PO Take 1 capsule by mouth at bedtime.     . Cholecalciferol (VITAMIN D) 1000 UNITS capsule Take 1,000 Units by mouth at bedtime.     . famotidine (PEPCID) 20 MG tablet Take 20 mg by mouth 2 (two) times daily.    Marland Kitchen ibuprofen (ADVIL,MOTRIN) 200 MG tablet Take 600-800 mg by mouth every 6 (six) hours as needed for moderate pain.    Marland Kitchen lidocaine (LIDODERM) 5 % Place 1 patch onto the skin daily as needed (NECK PAIN). Remove & Discard patch within 12 hours or as directed by MD    . losartan-hydrochlorothiazide (HYZAAR) 50-12.5 MG tablet Take 1 tablet by mouth daily. (Patient taking differently: Take 1 tablet by mouth at bedtime. ) 90 tablet 3  . oxybutynin (DITROPAN) 5 MG tablet Take 1 tablet (5 mg total) by mouth at bedtime. 30 tablet 11  . PROAIR HFA 108 (90 Base) MCG/ACT inhaler INHALE 1-2 PUFFS INTO THE LUNGS EVERY 4 (FOUR) HOURS AS NEEDED FOR WHEEZING OR SHORTNESS OF BREATH. 8.5 Inhaler 2  . SYMBICORT 160-4.5 MCG/ACT inhaler TAKE 2 PUFFS FIRST THING IN AM AND THEN ANOTHER 2 PUFFS ABOUT 12 HOURS LATER. 10.2 Inhaler 11  . traZODone (DESYREL) 50 MG tablet TAKE 1 TABLET BY MOUTH EVERY DAY 90 tablet 2  . zoledronic acid (RECLAST) 5 MG/100ML SOLN Inject 5 mg into the vein once. Every year     No facility-administered medications prior to visit.       ROS Review of Systems  Constitutional: Positive for fatigue. Negative for activity change, appetite change, chills and unexpected weight change.  HENT: Positive for hearing loss. Negative for congestion, mouth sores and sinus pressure.   Eyes: Negative for visual disturbance.  Respiratory: Negative for cough and chest tightness.   Gastrointestinal: Negative for abdominal pain and nausea.  Genitourinary: Positive for frequency and urgency. Negative for difficulty urinating and vaginal pain.  Musculoskeletal: Positive for back pain. Negative for gait problem.  Skin: Negative for pallor and rash.  Neurological: Negative for dizziness, tremors, weakness, numbness and headaches.  Psychiatric/Behavioral: Negative for confusion and sleep disturbance.  cramps  Objective:  BP 128/60 (BP Location: Left Arm, Patient Position: Sitting, Cuff Size: Normal)   Pulse 80   Temp 98 F (36.7 C) (Oral)   Ht 5\' 3"  (1.6 m)   Wt 91 lb 9.6 oz (41.5 kg)   SpO2 97%   BMI 16.23 kg/m   BP Readings from Last 3 Encounters:  09/26/16 128/60  09/17/16 (!) 106/55  08/26/16 (!) 126/52    Wt Readings from Last 3 Encounters:  09/26/16 91 lb 9.6 oz (41.5 kg)  08/26/16 94 lb (42.6 kg)  08/22/16 94 lb (42.6 kg)  Physical Exam  Constitutional: She appears well-developed. No distress.  HENT:  Head: Normocephalic.  Right Ear: External ear normal.  Left Ear: External ear normal.  Nose: Nose normal.  Mouth/Throat: Oropharynx is clear and moist.  Eyes: Conjunctivae are normal. Pupils are equal, round, and reactive to light. Right eye exhibits no discharge. Left eye exhibits no discharge.  Neck: Normal range of motion. Neck supple. No JVD present. No tracheal deviation present. No thyromegaly present.  Cardiovascular: Normal rate, regular rhythm and normal heart sounds.   Pulmonary/Chest: No stridor. No respiratory distress. She has no wheezes.  Abdominal: Soft. Bowel sounds are normal. She exhibits no  distension and no mass. There is no tenderness. There is no rebound and no guarding.  Musculoskeletal: She exhibits tenderness. She exhibits no edema.  Lymphadenopathy:    She has no cervical adenopathy.  Neurological: She displays normal reflexes. No cranial nerve deficit. She exhibits normal muscle tone. Coordination normal.  Skin: No rash noted. No erythema.  Psychiatric: She has a normal mood and affect. Her behavior is normal. Judgment and thought content normal.    Lab Results  Component Value Date   WBC 13.1 (H) 08/22/2016   HGB 11.9 (L) 08/22/2016   HCT 36.8 08/22/2016   PLT 346 08/22/2016   GLUCOSE 105 (H) 08/22/2016   CHOL 221 (H) 07/06/2013   TRIG 200.0 (H) 07/06/2013   HDL 60.90 07/06/2013   LDLDIRECT 126.5 06/14/2012   LDLCALC 120 (H) 07/06/2013   ALT 15 01/11/2016   AST 22 01/11/2016   NA 139 08/22/2016   K 5.3 (H) 08/22/2016   CL 105 08/22/2016   CREATININE 1.00 08/22/2016   BUN 32 (H) 08/22/2016   CO2 26 08/22/2016   TSH 1.21 11/08/2014   INR 1.0 09/04/2011   HGBA1C 5.8 (H) 06/04/2012    Mr Lumbar Spine Wo Contrast  Result Date: 09/10/2016 CLINICAL DATA:  Chronic bilateral low back pain with sciatica EXAM: MRI LUMBAR SPINE WITHOUT CONTRAST TECHNIQUE: Multiplanar, multisequence MR imaging of the lumbar spine was performed. No intravenous contrast was administered. COMPARISON:  Lumbar radiographs 10/18/2015 FINDINGS: Segmentation:  Normal Alignment: Normal alignment. Reversal of lumbar lordosis with mild kyphosis. Vertebrae:  Negative for fracture or mass.  Normal bone marrow. Conus medullaris: Extends to the L2-3 a level and appears normal. Paraspinal and other soft tissues: Negative Disc levels: T12-L1:  Negative L1-2: Moderate to advanced disc degeneration with disc space narrowing and spurring, right greater than left. No significant stenosis of the canal or neural foramina. L2-3: Advanced disc degeneration with disc space narrowing and diffuse endplate spurring.  No significant stenosis. L3-4: Advanced disc degeneration with diffuse endplate spurring. Left foraminal and extraforaminal osteophyte displacing the left L3 nerve root lateral to the foramen. Mild left foraminal encroachment. Subarticular stenosis on the left. Spinal canal adequate in size. L4-5: Advanced disc degeneration with disc space narrowing and diffuse endplate osteophyte formation. Marked ligamentum flavum hypertrophy on the left contributing to subarticular stenosis on the left. Mild foraminal narrowing bilaterally. Mild narrowing of the spinal canal L5-S1: Advanced disc degeneration with diffuse endplate spurring. Mild subarticular and foraminal stenosis bilaterally. IMPRESSION: Advanced multilevel degenerative change throughout the lumbar spine with disc space narrowing and diffuse spurring. Reversal of the lumbar lordosis. Left foraminal and extraforaminal osteophyte at L3-4 displacing the left L3 nerve root. Subarticular stenosis on the left Left subarticular stenosis L4-5 due to spurring and ligament flavum hypertrophy. Mild foraminal narrowing bilaterally Mild subarticular and foraminal stenosis bilaterally L5-S1 due to spurring. Electronically  Signed   By: Franchot Gallo M.D.   On: 09/10/2016 13:01    Assessment & Plan:   There are no diagnoses linked to this encounter. I am having Ms. Cadenhead maintain her Vitamin D, BIOTIN PO, zoledronic acid, baclofen, ibuprofen, famotidine, lidocaine, ALPRAZolam, losartan-hydrochlorothiazide, PROAIR HFA, acetaminophen, traZODone, SYMBICORT, and oxybutynin.  No orders of the defined types were placed in this encounter.    Follow-up: No Follow-up on file.  Walker Kehr, MD

## 2016-09-26 NOTE — Assessment & Plan Note (Signed)
Try Magnesium pills and/or Tylenol PM for cramps

## 2016-09-26 NOTE — Patient Instructions (Addendum)
Try Magnesium pills and/or Tylenol PM for cramps  Health Maintenance for Postmenopausal Women Menopause is a normal process in which your reproductive ability comes to an end. This process happens gradually over a span of months to years, usually between the ages of 64 and 53. Menopause is complete when you have missed 12 consecutive menstrual periods. It is important to talk with your health care provider about some of the most common conditions that affect postmenopausal women, such as heart disease, cancer, and bone loss (osteoporosis). Adopting a healthy lifestyle and getting preventive care can help to promote your health and wellness. Those actions can also lower your chances of developing some of these common conditions. What should I know about menopause? During menopause, you may experience a number of symptoms, such as:  Moderate-to-severe hot flashes.  Night sweats.  Decrease in sex drive.  Mood swings.  Headaches.  Tiredness.  Irritability.  Memory problems.  Insomnia.  Choosing to treat or not to treat menopausal changes is an individual decision that you make with your health care provider. What should I know about hormone replacement therapy and supplements? Hormone therapy products are effective for treating symptoms that are associated with menopause, such as hot flashes and night sweats. Hormone replacement carries certain risks, especially as you become older. If you are thinking about using estrogen or estrogen with progestin treatments, discuss the benefits and risks with your health care provider. What should I know about heart disease and stroke? Heart disease, heart attack, and stroke become more likely as you age. This may be due, in part, to the hormonal changes that your body experiences during menopause. These can affect how your body processes dietary fats, triglycerides, and cholesterol. Heart attack and stroke are both medical emergencies. There are many  things that you can do to help prevent heart disease and stroke:  Have your blood pressure checked at least every 1-2 years. High blood pressure causes heart disease and increases the risk of stroke.  If you are 37-29 years old, ask your health care provider if you should take aspirin to prevent a heart attack or a stroke.  Do not use any tobacco products, including cigarettes, chewing tobacco, or electronic cigarettes. If you need help quitting, ask your health care provider.  It is important to eat a healthy diet and maintain a healthy weight. ? Be sure to include plenty of vegetables, fruits, low-fat dairy products, and lean protein. ? Avoid eating foods that are high in solid fats, added sugars, or salt (sodium).  Get regular exercise. This is one of the most important things that you can do for your health. ? Try to exercise for at least 150 minutes each week. The type of exercise that you do should increase your heart rate and make you sweat. This is known as moderate-intensity exercise. ? Try to do strengthening exercises at least twice each week. Do these in addition to the moderate-intensity exercise.  Know your numbers.Ask your health care provider to check your cholesterol and your blood glucose. Continue to have your blood tested as directed by your health care provider.  What should I know about cancer screening? There are several types of cancer. Take the following steps to reduce your risk and to catch any cancer development as early as possible. Breast Cancer  Practice breast self-awareness. ? This means understanding how your breasts normally appear and feel. ? It also means doing regular breast self-exams. Let your health care provider know about any changes,  no matter how small.  If you are 26 or older, have a clinician do a breast exam (clinical breast exam or CBE) every year. Depending on your age, family history, and medical history, it may be recommended that you  also have a yearly breast X-ray (mammogram).  If you have a family history of breast cancer, talk with your health care provider about genetic screening.  If you are at high risk for breast cancer, talk with your health care provider about having an MRI and a mammogram every year.  Breast cancer (BRCA) gene test is recommended for women who have family members with BRCA-related cancers. Results of the assessment will determine the need for genetic counseling and BRCA1 and for BRCA2 testing. BRCA-related cancers include these types: ? Breast. This occurs in males or females. ? Ovarian. ? Tubal. This may also be called fallopian tube cancer. ? Cancer of the abdominal or pelvic lining (peritoneal cancer). ? Prostate. ? Pancreatic.  Cervical, Uterine, and Ovarian Cancer Your health care provider may recommend that you be screened regularly for cancer of the pelvic organs. These include your ovaries, uterus, and vagina. This screening involves a pelvic exam, which includes checking for microscopic changes to the surface of your cervix (Pap test).  For women ages 21-65, health care providers may recommend a pelvic exam and a Pap test every three years. For women ages 91-65, they may recommend the Pap test and pelvic exam, combined with testing for human papilloma virus (HPV), every five years. Some types of HPV increase your risk of cervical cancer. Testing for HPV may also be done on women of any age who have unclear Pap test results.  Other health care providers may not recommend any screening for nonpregnant women who are considered low risk for pelvic cancer and have no symptoms. Ask your health care provider if a screening pelvic exam is right for you.  If you have had past treatment for cervical cancer or a condition that could lead to cancer, you need Pap tests and screening for cancer for at least 20 years after your treatment. If Pap tests have been discontinued for you, your risk factors  (such as having a new sexual partner) need to be reassessed to determine if you should start having screenings again. Some women have medical problems that increase the chance of getting cervical cancer. In these cases, your health care provider may recommend that you have screening and Pap tests more often.  If you have a family history of uterine cancer or ovarian cancer, talk with your health care provider about genetic screening.  If you have vaginal bleeding after reaching menopause, tell your health care provider.  There are currently no reliable tests available to screen for ovarian cancer.  Lung Cancer Lung cancer screening is recommended for adults 39-63 years old who are at high risk for lung cancer because of a history of smoking. A yearly low-dose CT scan of the lungs is recommended if you:  Currently smoke.  Have a history of at least 30 pack-years of smoking and you currently smoke or have quit within the past 15 years. A pack-year is smoking an average of one pack of cigarettes per day for one year.  Yearly screening should:  Continue until it has been 15 years since you quit.  Stop if you develop a health problem that would prevent you from having lung cancer treatment.  Colorectal Cancer  This type of cancer can be detected and can often be  prevented.  Routine colorectal cancer screening usually begins at age 108 and continues through age 85.  If you have risk factors for colon cancer, your health care provider may recommend that you be screened at an earlier age.  If you have a family history of colorectal cancer, talk with your health care provider about genetic screening.  Your health care provider may also recommend using home test kits to check for hidden blood in your stool.  A small camera at the end of a tube can be used to examine your colon directly (sigmoidoscopy or colonoscopy). This is done to check for the earliest forms of colorectal cancer.  Direct  examination of the colon should be repeated every 5-10 years until age 10. However, if early forms of precancerous polyps or small growths are found or if you have a family history or genetic risk for colorectal cancer, you may need to be screened more often.  Skin Cancer  Check your skin from head to toe regularly.  Monitor any moles. Be sure to tell your health care provider: ? About any new moles or changes in moles, especially if there is a change in a mole's shape or color. ? If you have a mole that is larger than the size of a pencil eraser.  If any of your family members has a history of skin cancer, especially at a young age, talk with your health care provider about genetic screening.  Always use sunscreen. Apply sunscreen liberally and repeatedly throughout the day.  Whenever you are outside, protect yourself by wearing long sleeves, pants, a wide-brimmed hat, and sunglasses.  What should I know about osteoporosis? Osteoporosis is a condition in which bone destruction happens more quickly than new bone creation. After menopause, you may be at an increased risk for osteoporosis. To help prevent osteoporosis or the bone fractures that can happen because of osteoporosis, the following is recommended:  If you are 36-50 years old, get at least 1,000 mg of calcium and at least 600 mg of vitamin D per day.  If you are older than age 7 but younger than age 29, get at least 1,200 mg of calcium and at least 600 mg of vitamin D per day.  If you are older than age 79, get at least 1,200 mg of calcium and at least 800 mg of vitamin D per day.  Smoking and excessive alcohol intake increase the risk of osteoporosis. Eat foods that are rich in calcium and vitamin D, and do weight-bearing exercises several times each week as directed by your health care provider. What should I know about how menopause affects my mental health? Depression may occur at any age, but it is more common as you become  older. Common symptoms of depression include:  Low or sad mood.  Changes in sleep patterns.  Changes in appetite or eating patterns.  Feeling an overall lack of motivation or enjoyment of activities that you previously enjoyed.  Frequent crying spells.  Talk with your health care provider if you think that you are experiencing depression. What should I know about immunizations? It is important that you get and maintain your immunizations. These include:  Tetanus, diphtheria, and pertussis (Tdap) booster vaccine.  Influenza every year before the flu season begins.  Pneumonia vaccine.  Shingles vaccine.  Your health care provider may also recommend other immunizations. This information is not intended to replace advice given to you by your health care provider. Make sure you discuss any questions you  have with your health care provider. Document Released: 06/06/2005 Document Revised: 11/02/2015 Document Reviewed: 01/16/2015 Elsevier Interactive Patient Education  2018 [employer].

## 2016-09-29 NOTE — Progress Notes (Signed)
Amber Snow Date of Birth: [birth year] Medical Record #[id number]  History of Present Illness: [name] is seen back today for a follow up cardiomyopathy. She has a history of nonischemic CM with EF of 35 to 40% by cath and echo from May of 2013. Nuclear at that time showed an EF of 27%. Echo in November of 2015 showed EF was 35-40%. She has had minimal nonobstructive disease by cardiac cath in May 2013. Other issues include COPD, past smoker, HTN, PVD, HLD, and recurrent bladder CA.    She had to stop taking aldactone due to leg cramps and bisoprolol due to persistent lightheadedness and COPD. These symptoms have resolved. She does have recurrent bladder CA with several follow up cystoscopies.   On follow up today she is doing well from a cardiac standpoint. Denies CP, SOB, edema, orthopnea, or PND. States she is planning to move to [place] to be closer to her son.   Current Outpatient Prescriptions  Medication Sig Dispense Refill  . acetaminophen (TYLENOL) 500 MG tablet Take 1,000 mg by mouth every 6 (six) hours as needed for moderate pain or headache.    . ALPRAZolam (XANAX) 0.25 MG tablet TAKE 1/2 TO 1 TABLET BY MOUTH TWICE A DAY AS NEEDED FOR ANXIETY (SHORTNESS OF BREATH) 60 tablet 2  . baclofen (LIORESAL) 10 MG tablet Take 10 mg by mouth 3 (three) times daily as needed for muscle spasms.     Marland Kitchen BIOTIN PO Take 1 capsule by mouth at bedtime.     . Cholecalciferol (VITAMIN D) 1000 UNITS capsule Take 1,000 Units by mouth at bedtime.     . famotidine (PEPCID) 20 MG tablet Take 20 mg by mouth 2 (two) times daily.    Marland Kitchen ibuprofen (ADVIL,MOTRIN) 200 MG tablet Take 600-800 mg by mouth every 6 (six) hours as needed for moderate pain.    Marland Kitchen lidocaine (LIDODERM) 5 % Place 1 patch onto the skin daily as needed (NECK PAIN). Remove & Discard patch within 12 hours or as directed by MD    . losartan-hydrochlorothiazide (HYZAAR) 50-12.5 MG tablet Take 1 tablet by mouth daily. (Patient taking  differently: Take 1 tablet by mouth at bedtime. ) 90 tablet 3  . PROAIR HFA 108 (90 Base) MCG/ACT inhaler INHALE 1-2 PUFFS INTO THE LUNGS EVERY 4 (FOUR) HOURS AS NEEDED FOR WHEEZING OR SHORTNESS OF BREATH. 8.5 Inhaler 2  . SYMBICORT 160-4.5 MCG/ACT inhaler TAKE 2 PUFFS FIRST THING IN AM AND THEN ANOTHER 2 PUFFS ABOUT 12 HOURS LATER. 10.2 Inhaler 11  . traZODone (DESYREL) 50 MG tablet TAKE 1 TABLET BY MOUTH EVERY DAY 90 tablet 2  . zoledronic acid (RECLAST) 5 MG/100ML SOLN Inject 5 mg into the vein once. Every year     No current facility-administered medications for this visit.     Allergies  Allergen Reactions  . Barbiturates Rash  . Penicillins Rash    TOLERATES CEPHALOSPORINS Has patient had a PCN reaction causing immediate rash, facial/tongue/throat swelling, SOB or lightheadedness with hypotension: Yes  Has patient had a PCN reaction causing severe rash involving mucus membranes or skin necrosis: Yes. Itchy Has patient had a PCN reaction that required hospitalization No Has patient had a PCN reaction occurring within the last 10 years: No If all of the above answers are "NO", then may proceed with Cephalosporin use.   . Shrimp [Shellfish Allergy] Swelling  . Anoro Ellipta [Umeclidinium-Vilanterol]     cough  . Aspirin Nausea Only  . Beta Adrenergic Blockers  PT STATES TOLD TO AVOID DUE TO COUGH REACTION TO CARDIZEM  . Bisoprolol     SOB and dizzy  . Calcium Channel Blockers     PER PT TOLD TO AVOID DUE TO COUGH REACTION TO CARDIZEM  . Celecoxib Nausea Only  . Ciprofloxacin Other (See Comments)    Red streaks on arm when given IV  . Codeine Nausea Only and Other (See Comments)    REACTION: excessive sleep  . Diltiazem Hcl Other (See Comments)    REACTION: cough  . Fluvastatin Sodium     Hair fell out, finger nails fell off   . Ibandronate Sodium Other (See Comments)    REACTION: HAIR LOSS/NAIL CHANGES  . Oxycodone-Acetaminophen Nausea Only  . Rosuvastatin     Hair  fell out, finger nails fell off   . Simvastatin     Hair fell out, finger nails fell off   . Tramadol Nausea And Vomiting    Past Medical History:  Diagnosis Date  . Arthritis HANDS  . Bladder calculus   . Bladder cancer ([place])    Chemo last 10'17. -[facility]  . Chronic systolic CHF (congestive heart failure) ([place])   . Diverticulosis of colon   . Dyspnea    with exertion only- intermittent cough  . Dyspnea on exertion   . GERD (gastroesophageal reflux disease)   . Hearing impaired person, bilateral    wears hearing aids bilateral  . History of kidney stones   . History of nephrolithiasis   . Hyperlipidemia   . LBBB (left bundle branch block)    CHRONIC  . Myalgia   . Neck pain    07-30-15 at present and flares periodically"? stenosis"  . Nocturia   . Nodule of right lung    APICAL 1.0CM (MONITORED BY DR Melvyn Novas)  . Nonischemic cardiomyopathy ([place])   . Osteopenia   . PAC (premature atrial contraction)   . Pneumonia 1-2 YEARS AGO  . Wears hearing aid    BILATERAL    Past Surgical History:  Procedure Laterality Date  . CARDIAC CATHETERIZATION  09-12-2011  DR Martinique   MINIMAL NONOBSTRUCTIVE CAD/ MODERATE TO SEVERE LV DYSFUNCTION/  NORMAL RIGHT HEART PRESSURES AND LV FILLING PRESSURES  . CATARACT EXTRACTION W/ INTRAOCULAR LENS  IMPLANT, BILATERAL  1998  . COSMETIC SURGERY     eyelids  . CYSTO/ LEFT RETROGRADE PYELOGRAM/ LEFT URETERAL STENT PLACEMENT  10-16-2001   STONE  . CYSTOSCOPY  07/01/2011   Procedure: CYSTOSCOPY;  Surgeon: Hanley Ben, MD;  Location: [facility];  Service: Urology;  Laterality: N/A;  retrieval l of bladder stone  . CYSTOSCOPY N/A 08/20/2012   Procedure: CYSTOSCOPY;  Surgeon: Hanley Ben, MD;  Location: [facility];  Service: Urology;  Laterality: N/A;  fulgeration of bladderm tumors  . CYSTOSCOPY N/A 06/23/2014   Procedure: CYSTOSCOPY;  Surgeon: Arvil Persons, MD;  Location: [facility];  Service: Urology;   Laterality: N/A;  . CYSTOSCOPY W/ RETROGRADES Right 04/01/2013   Procedure: CYSTOSCOPY WITH RETROGRADE PYELOGRAM;  Surgeon: Hanley Ben, MD;  Location: [address];  Service: Urology;  Laterality: Right;  . CYSTOSCOPY WITH BIOPSY N/A 02/06/2015   Procedure: CYSTO WITH BLADDER BIOPSY;  Surgeon: Festus Aloe, MD;  Location: [facility];  Service: Urology;  Laterality: N/A;  . CYSTOSCOPY WITH BIOPSY N/A 04/04/2016   Procedure: CYSTOSCOPY WITH BIOPSY AND FULGURATION EPIRUBICIN BLADDER INSTILLATION;  Surgeon: Festus Aloe, MD;  Location: [facility];  Service: Urology;  Laterality: N/A;  .  CYSTOSCOPY WITH FULGERATION N/A 11/27/2015   Procedure: CYSTOSCOPY WITH FULGERATION BLADDER BIOPSY ;  Surgeon: Festus Aloe, MD;  Location: [facility];  Service: Urology;  Laterality: N/A;  . CYSTOSCOPY WITH LITHOLAPAXY N/A 04/01/2013   Procedure: CYSTOSCOPY WITH HOLMIUM LASER OF  BLADDER CALCULUS;  Surgeon: Hanley Ben, MD;  Location: [place];  Service: Urology;  Laterality: N/A;  . CYSTOSCOPY WITH RETROGRADE PYELOGRAM, URETEROSCOPY AND STENT PLACEMENT Bilateral 07/31/2015   Procedure: CYSTOSCOPY WITH FULGERATION BLADDER BIOPSY, CYSTOSCOPY WITH BILATERAL RETROGRADE PYELOGRAMS;  Surgeon: Festus Aloe, MD;  Location: [facility];  Service: Urology;  Laterality: Bilateral;  . EXTRACORPOREAL SHOCK WAVE LITHOTRIPSY  2003  . FULGURATION OF BLADDER TUMOR  06/23/2014   Procedure: FULGURATION OF BLADDER TUMOR;  Surgeon: Arvil Persons, MD;  Location: [facility];  Service: Urology;;  . FULGURATION OF BLADDER TUMOR N/A 02/06/2015   Procedure: FULGURATION OF BLADDER TUMORS;  Surgeon: Festus Aloe, MD;  Location: [facility];  Service: Urology;  Laterality: N/A;  . HOLMIUM LASER APPLICATION N/A 40/12/8117   Procedure: HOLMIUM LASER APPLICATION;  Surgeon: Hanley Ben, MD;  Location: [place];  Service: Urology;  Laterality: N/A;  . LEFT AND RIGHT HEART  CATHETERIZATION WITH CORONARY ANGIOGRAM Bilateral 09/12/2011   Procedure: LEFT AND RIGHT HEART CATHETERIZATION WITH CORONARY ANGIOGRAM;  Surgeon: Delpha Perko M Martinique, MD;  Location: [facility];  Service: Cardiovascular;  Laterality: Bilateral;  . LEFT LONG FINGER ARTHROPLASTY & PULLEY RELASE  02-17-2005  . ORIF FINGER / THUMB FRACTURE    . PULMONARY FUNCTION ANALYSIS  07-31-2009   MODERATE TO SEVERE OBSTRUCTIVE AIRWAY DISEASE/ INSIGNIFICANT RESPONSE TO BRONCHODILATORS/ EMPHYSEMA PATTERN/ MILD DECREASE IN DIFFUSE CAPACITY FEF 25-75 CHANGED BY 8%  . TONSILLECTOMY  CHILD  . TOOTH EXTRACTION  01-17-15   LOWER TOOTH CROWN  BROKE OFF AND WAS EXTRACTED  . TRANSTHORACIC ECHOCARDIOGRAM  last one 03-15-2014  DR Martinique   GRADE I DIASTOLIC DYSFUNCTION/  confirmed by dr Liane Comber  EF 45% (down from 60-65% 03-12-2013 but up from 25-30% compared to last echo 10-01-2012 /  NORMAL LV SIZE AND WALL THICKNESS/  trivial TR  . TRANSURETHRAL RESECTION OF BLADDER TUMOR  07/01/2011   Procedure: TRANSURETHRAL RESECTION OF BLADDER TUMOR (TURBT);  Surgeon: Hanley Ben, MD;  Location: [facility];  Service: Urology;  Laterality: N/A;  CYSTO  . TRANSURETHRAL RESECTION OF BLADDER TUMOR N/A 02/06/2015   Procedure:  TRANSURETHRAL RESECTION OF BLADDER TUMOR (TURBT) GREATER THAN 5 CM; RIGHT RETROGRADE, RIGHT STENT PLACEMENT;  Surgeon: Festus Aloe, MD;  Location: [facility];  Service: Urology;  Laterality: N/A;  . TRANSURETHRAL RESECTION OF BLADDER TUMOR N/A 07/31/2015   Procedure:  TRANSURETHRAL RESECTION OF BLADDER TUMOR (TURBT); REMOVAL OF DYSTROPHIC CALCIFICATIONS;  Surgeon: Festus Aloe, MD;  Location: [facility];  Service: Urology;  Laterality: N/A;  . TRANSURETHRAL RESECTION OF BLADDER TUMOR N/A 11/27/2015   Procedure: TRANSURETHRAL RESECTION OF BLADDER TUMOR (TURBT);  Surgeon: Festus Aloe, MD;  Location: [facility];  Service: Urology;  Laterality: N/A;  . TRANSURETHRAL RESECTION OF BLADDER TUMOR WITH MITOMYCIN-C N/A  08/26/2016   Procedure: CYSTOSCOPY TRANSURETHRAL RESECTION OF BLADDER TUMOR WITH EPIRUBACIN;  Surgeon: Festus Aloe, MD;  Location: [facility];  Service: Urology;  Laterality: N/A;  . TUBAL LIGATION  AGE 68'S    History  Smoking Status  . Former Smoker  . Packs/day: 1.00  . Years: 50.00  . Types: Cigarettes  . Quit date: 09/27/2011  Smokeless Tobacco  . Never Used    History  Alcohol Use  . Yes    Comment: wine rarely    Family History  Problem Relation Age of Onset  . Heart disease Mother   . Hypertension Mother   . Hypertension Father   . Heart attack Father   . Heart disease Father   . Thyroid disease Sister   . Heart attack Sister   . Stroke Sister   . COPD Sister   . Heart attack Brother   . Breast cancer Maternal Grandmother   . Diabetes Paternal Grandmother   . Asthma Neg Hx     Review of Systems: The review of systems is per the HPI.  All other systems were reviewed and are negative.  Physical Exam: BP 118/60   Pulse 96   Ht 5\' 3"  (1.6 m)   Wt 90 lb 12.8 oz (41.2 kg)   BMI 16.08 kg/m  No orthostatic changes. Patient is very pleasant and is in moderate respiratory distress. Skin is warm and dry. Color is normal.  HEENT is unremarkable.  Neck is supple. No masses. No JVD. Lungs are clear. Cardiac exam shows a regular rate and rhythm. Normal S1-2. No gallop or murmur. Abdomen is soft. Extremities are without edema. Gait and ROM are intact. No gross neurologic deficits noted.  LABORATORY DATA:   Lab Results  Component Value Date   WBC 13.1 (H) 08/22/2016   HGB 11.9 (L) 08/22/2016   HCT 36.8 08/22/2016   PLT 346 08/22/2016   GLUCOSE 97 10/01/2016   CHOL 224 (H) 10/01/2016   TRIG 214.0 (H) 10/01/2016   HDL 61.40 10/01/2016   LDLDIRECT 132.0 10/01/2016   LDLCALC 120 (H) 07/06/2013   ALT 14 10/01/2016   AST 20 10/01/2016   NA 138 10/01/2016   K 3.6 10/01/2016   CL 105 10/01/2016   CREATININE 0.88 10/01/2016   BUN 24 (H) 10/01/2016   CO2 24  10/01/2016   TSH 1.37 10/01/2016   INR 1.0 09/04/2011   HGBA1C 5.8 (H) 06/04/2012   Echo Study Conclusions from November 2015  Study Conclusions  - Left ventricle: The cavity size was normal. Wall thickness was normal. Systolic function was moderately reduced. The estimated ejection fraction was in the range of 35% to 40%. Doppler parameters are consistent with abnormal left ventricular relaxation (grade 1 diastolic dysfunction). - Ventricular septum: Septal motion showed abnormal function and dyssynergy.   Assessment / Plan:   1. Nonischemic CM with chronic systolic heart failure - EF 35-40% by Echo in November 2015. - Now just on losartan. Other meds stopped due to side effects. She is asymptomatic.    2. Chronic LBBB   3. CAD - nonobstructive per last cath - managed medically - no symptoms.   4. COPD followed by Dr. Melvyn Novas.   Patient will establish with cardiology once she moves to [place]. I will see as needed while here.

## 2016-09-30 ENCOUNTER — Telehealth (INDEPENDENT_AMBULATORY_CARE_PROVIDER_SITE_OTHER): Payer: Self-pay | Admitting: Physical Medicine and Rehabilitation

## 2016-09-30 NOTE — Telephone Encounter (Signed)
Scheduled for 10/13/16 at 1330 with driver.

## 2016-09-30 NOTE — Telephone Encounter (Signed)
Bilateral two level facet injeciton at L4-5 and L5-S1

## 2016-10-01 ENCOUNTER — Other Ambulatory Visit (INDEPENDENT_AMBULATORY_CARE_PROVIDER_SITE_OTHER): Payer: Medicare Other

## 2016-10-01 ENCOUNTER — Ambulatory Visit (INDEPENDENT_AMBULATORY_CARE_PROVIDER_SITE_OTHER): Payer: Medicare Other | Admitting: Cardiology

## 2016-10-01 VITALS — BP 118/60 | HR 96 | Ht 63.0 in | Wt 90.8 lb

## 2016-10-01 DIAGNOSIS — Z0000 Encounter for general adult medical examination without abnormal findings: Secondary | ICD-10-CM | POA: Diagnosis not present

## 2016-10-01 DIAGNOSIS — E785 Hyperlipidemia, unspecified: Secondary | ICD-10-CM

## 2016-10-01 DIAGNOSIS — I2510 Atherosclerotic heart disease of native coronary artery without angina pectoris: Secondary | ICD-10-CM

## 2016-10-01 DIAGNOSIS — I5022 Chronic systolic (congestive) heart failure: Secondary | ICD-10-CM | POA: Diagnosis not present

## 2016-10-01 LAB — LIPID PANEL
CHOLESTEROL: 224 mg/dL — AB (ref 0–200)
HDL: 61.4 mg/dL (ref >39.00)
NonHDL: 162.55
Total CHOL/HDL Ratio: 4
Triglycerides: 214 mg/dL — ABNORMAL HIGH (ref 0.0–149.0)
VLDL: 42.8 mg/dL — AB (ref 0.0–40.0)

## 2016-10-01 LAB — BASIC METABOLIC PANEL
BUN: 24 mg/dL — AB (ref 6–23)
CHLORIDE: 105 meq/L (ref 96–112)
CO2: 24 mEq/L (ref 19–32)
CREATININE: 0.88 mg/dL (ref 0.40–1.20)
Calcium: 9.3 mg/dL (ref 8.4–10.5)
GFR: 65.43 mL/min (ref >60.00)
Glucose, Bld: 97 mg/dL (ref 70–99)
Potassium: 3.6 mEq/L (ref 3.5–5.1)
Sodium: 138 mEq/L (ref 135–145)

## 2016-10-01 LAB — URINALYSIS, ROUTINE W REFLEX MICROSCOPIC
BILIRUBIN URINE: NEGATIVE
KETONES UR: NEGATIVE
NITRITE: POSITIVE — AB
Specific Gravity, Urine: 1.015 (ref 1.000–1.030)
TOTAL PROTEIN, URINE-UPE24: 30 — AB
URINE GLUCOSE: NEGATIVE
UROBILINOGEN UA: 0.2 (ref 0.0–1.0)
pH: 6.5 (ref 5.0–8.0)

## 2016-10-01 LAB — HEPATIC FUNCTION PANEL
ALBUMIN: 4 g/dL (ref 3.5–5.2)
ALT: 14 U/L (ref 0–35)
AST: 20 U/L (ref 0–37)
Alkaline Phosphatase: 68 U/L (ref 39–117)
Bilirubin, Direct: 0.1 mg/dL (ref 0.0–0.3)
TOTAL PROTEIN: 6.8 g/dL (ref 6.0–8.3)
Total Bilirubin: 0.4 mg/dL (ref 0.2–1.2)

## 2016-10-01 LAB — LDL CHOLESTEROL, DIRECT: Direct LDL: 132 mg/dL

## 2016-10-01 LAB — TSH: TSH: 1.37 u[IU]/mL (ref 0.35–4.50)

## 2016-10-01 LAB — MAGNESIUM: MAGNESIUM: 2 mg/dL (ref 1.5–2.5)

## 2016-10-01 NOTE — Patient Instructions (Addendum)
Continue your current therapy  I will see you prn

## 2016-10-03 ENCOUNTER — Telehealth: Payer: Self-pay | Admitting: Internal Medicine

## 2016-10-03 ENCOUNTER — Telehealth: Payer: Self-pay

## 2016-10-03 MED ORDER — CEFUROXIME AXETIL 250 MG PO TABS: 250.0000 mg | ORAL_TABLET | Freq: Two times a day (BID) | ORAL | 0 refills | Status: AC

## 2016-10-03 NOTE — Telephone Encounter (Signed)
Patient called back.  Gave patient MD response.  Please send med to pharmacy.

## 2016-10-03 NOTE — Telephone Encounter (Signed)
RX sent

## 2016-10-03 NOTE — Telephone Encounter (Signed)
-----   Message from Cassandria Anger, MD sent at 10/02/2016 12:10 AM EDT ----- Amber Snow, Please inform the patient that all labs are stable, except for a UTI. Pls ask if the pt can use Ceftin (Cefuroxime). If she can - call in Ceftin 250 mg po bid x 7 d Thx Thanks, AP

## 2016-10-13 ENCOUNTER — Ambulatory Visit (INDEPENDENT_AMBULATORY_CARE_PROVIDER_SITE_OTHER): Payer: Medicare Other | Admitting: Physical Medicine and Rehabilitation

## 2016-10-13 ENCOUNTER — Ambulatory Visit (INDEPENDENT_AMBULATORY_CARE_PROVIDER_SITE_OTHER): Payer: Medicare Other

## 2016-10-13 ENCOUNTER — Encounter (INDEPENDENT_AMBULATORY_CARE_PROVIDER_SITE_OTHER): Payer: Self-pay | Admitting: Physical Medicine and Rehabilitation

## 2016-10-13 VITALS — BP 123/55 | HR 82

## 2016-10-13 DIAGNOSIS — M47816 Spondylosis without myelopathy or radiculopathy, lumbar region: Secondary | ICD-10-CM

## 2016-10-13 DIAGNOSIS — G8929 Other chronic pain: Secondary | ICD-10-CM

## 2016-10-13 DIAGNOSIS — M545 Low back pain: Secondary | ICD-10-CM

## 2016-10-13 MED ORDER — METHYLPREDNISOLONE ACETATE 80 MG/ML IJ SUSP
80.0000 mg | Freq: Once | INTRAMUSCULAR | Status: AC
  Administered 2016-10-13: 80 mg

## 2016-10-13 MED ORDER — LIDOCAINE HCL (PF) 1 % IJ SOLN
2.0000 mL | Freq: Once | INTRAMUSCULAR | Status: AC
  Administered 2016-10-13: 2 mL

## 2016-10-13 NOTE — Progress Notes (Deleted)
Pain across lower back and worse on left side. Both legs ache almost constant. Some relief using with lidocaine patches. [place]

## 2016-10-13 NOTE — Patient Instructions (Signed)

## 2016-10-14 NOTE — Procedures (Signed)
Lumbar Facet Joint Intra-Articular Injection(s) with Fluoroscopic Guidance  Patient: Amber Snow      Date of Birth: 08/23/1932 MRN: [id number] PCP: Cassandria Anger, MD      Visit Date: 10/13/2016   Amber Snow is an 83 year old female well known to our practice mainly for her neck pain which is chronic. She also suffers from chronic COPD and bladder cancer. She most recently began having more axial low back pain worse with standing and ambulating better at rest. MRI evidence of facet arthropathy without listhesis or stenosis. We are going to complete diagnostic and therapeutic facet joint blocks at L4-5 and L5-S1. Please see our prior evaluation and management note for further details and justification. She is going to be moving to [facility] in the coming months. We will try to see if there are any physiatry or interventional pain physicians in her area.  Universal Protocol:    Date/Time: 06/19/185:57 AM  Consent Given By: the patient  Position: PRONE   Additional Comments: Vital signs were monitored before and after the procedure. Patient was prepped and draped in the usual sterile fashion. The correct patient, procedure, and site was verified.   Injection Procedure Details:  Procedure Site One Meds Administered:  Meds ordered this encounter  Medications  . lidocaine (PF) (XYLOCAINE) 1 % injection 2 mL  . methylPREDNISolone acetate (DEPO-MEDROL) injection 80 mg     Laterality: Bilateral  Location/Site:  L4-L5 L5-S1  Needle size: 22 guage  Needle type: Spinal  Needle Placement: Articular  Findings:  -Contrast Used: 1 mL iohexol 180 mg iodine/mL   -Comments: Excellent flow of contrast producing a partial arthrogram.  Procedure Details: The fluoroscope beam is vertically oriented in AP, and the inferior recess is visualized beneath the lower pole of the inferior apophyseal process, which represents the target point for needle insertion. When direct  visualization is difficult the target point is located at the medial projection of the vertebral pedicle. The region overlying each aforementioned target is locally anesthetized with a 1 to 2 ml. volume of 1% Lidocaine without Epinephrine.   The spinal needle was inserted into each of the above mentioned facet joints using biplanar fluoroscopic guidance. A 0.25 to 0.5 ml. volume of Isovue-250 was injected and a partial facet joint arthrogram was obtained. A single spot film was obtained of the resulting arthrogram.    One to 1.25 ml of the steroid/anesthetic solution was then injected into each of the facet joints noted above.   Additional Comments:  The patient tolerated the procedure well Dressing: Band-Aid    Post-procedure details: Patient was observed during the procedure. Post-procedure instructions were reviewed.  Patient left the clinic in stable condition.

## 2016-10-21 DIAGNOSIS — D3132 Benign neoplasm of left choroid: Secondary | ICD-10-CM | POA: Diagnosis not present

## 2016-10-21 DIAGNOSIS — H52203 Unspecified astigmatism, bilateral: Secondary | ICD-10-CM | POA: Diagnosis not present

## 2016-11-10 ENCOUNTER — Telehealth (INDEPENDENT_AMBULATORY_CARE_PROVIDER_SITE_OTHER): Payer: Self-pay | Admitting: Physical Medicine and Rehabilitation

## 2016-11-10 NOTE — Telephone Encounter (Signed)
Scheduled for 11/24/16 at 0915.

## 2016-11-10 NOTE — Telephone Encounter (Signed)
T1-2 interlam

## 2016-11-17 ENCOUNTER — Other Ambulatory Visit: Payer: Self-pay | Admitting: Internal Medicine

## 2016-11-17 ENCOUNTER — Ambulatory Visit (INDEPENDENT_AMBULATORY_CARE_PROVIDER_SITE_OTHER): Payer: Medicare Other | Admitting: Internal Medicine

## 2016-11-17 ENCOUNTER — Encounter: Payer: Self-pay | Admitting: Internal Medicine

## 2016-11-17 VITALS — BP 138/60 | HR 79 | Ht 63.0 in | Wt 90.4 lb

## 2016-11-17 DIAGNOSIS — J449 Chronic obstructive pulmonary disease, unspecified: Secondary | ICD-10-CM | POA: Diagnosis not present

## 2016-11-17 DIAGNOSIS — C672 Malignant neoplasm of lateral wall of bladder: Secondary | ICD-10-CM | POA: Diagnosis not present

## 2016-11-17 MED ORDER — PREDNISONE 10 MG PO TABS: ORAL_TABLET | ORAL | 0 refills | Status: DC

## 2016-11-17 NOTE — Patient Instructions (Addendum)
Prednisone 10 mg take  4 each am x 2 days,   2 each am x 2 days,  1 each am x 2 days and stop   You will need to establish with a board certified pulmonary doctor in [place] - call for records when you establish - call us in meantime if needed

## 2016-11-17 NOTE — Progress Notes (Signed)
Subjective:   Patient ID: Amber Snow, female    DOB: [birth year]  MRN: [id number]   Brief patient profile:  52  yowf with GOLD II copd by pfts 07/2009 quit smoking June 2013  due to progressive doe x 5 years worse since quit referred to pulmonary clinic 06/17/2012 by Dr Ignacia Palma    History of Present Illness  07/10/2015  f/u ov/Lorik Guo re: GOLD II/ could not afford bevespi and not convinced better than symbicort   Chief Complaint  Patient presents with  . Follow-up    Increased SOB x 2 wks. She has not felt well in general. She also c/o cough- mainly non prod. She is using proair once daily on average.   onset gradually worse x 2 weeks with any activity >  ? If better with albuterol ? Better with prednisone and feels worse off it in terms of energy but not cough/sob which is worse with use of arms. >>cont on Symbicort and Spiriva   08/09/2015 Follow up : [name]/COPD GOLD II  Pt returns for 1 month follow up and med review  Did not bring her meds today. She was upset that she is not seeing Dr. Melvyn Novas  Today.  Offered to reschedule x 2 but pt declined.  Complains of that her mouth is very sore.  Says can not tolerate Spiriva . Dries her out way too much , causes her tongue to be too sore.  Is taking Symbicort Twice daily  , says she rinses well after use.  Complains of 1 week of increased cough, congestion with green mucus. More sob than usual. No wheezing , chest pain, hemoptysis or fever.  Followed by Urology for bladder cancer . Had cystoscopy 07/31/15  Rec Doxycycline 100 mg twice daily for 1 week, take with food. Use sunscreen. If outside as this antibiotic may cause sunburn Mucinex DM twice daily as needed for cough and congestion. Mycelex lozenges 5 times daily for 1 week Continue on Symbicort 2 puffs twice daily, rinse after each use   11/08/2015  f/u ov/Melady Chow re: GOLD II copd/ symbicort 160 2bid / no med calendar  Chief Complaint  Patient presents with  . Follow-up   Breathing is some worse- relates to humid weather. Cough is prod with white sputum. She has noticed increased wheezing. She is using albuterol once per wk on average.   sleeping ok/ note aecopd @ last ov and responded back to baseline  Ok walking indoors = MMRC2 = can't walk a nl pace on a flat grade s sob but does fine slow and flat eg [employer] but not mall  C/o swelling in legs/ dependent daytime despite cozaar as her bp med and using lasix 20 mg prn  rec Discuss with Dr Alain Marion add hctz to your cozar  No change in pulmonary medications     01/22/2016  f/u ov/Keyira Mondesir re: GOLD II  copd/ symbicort 160 2bid  Chief Complaint  Patient presents with  . Follow-up    c/o sob worse 3-4 days ago,was using Albuterol 1x day, little better now,cough-thick white,chest tightness,denies cp,  no change ex tol Never took more than one proair daily and improved since flare of cough  Doe still  = mmrc 2  rec Plan A = Automatic = Symbicort 160 Take 2 puffs first thing in am and then another 2 puffs about 12 hours later.  Plan B = Backup Only use your albuterol as a rescue medication  - ok to use up to 2  pffs q4h prn      11/17/2016  f/u ov/Cavan Bearden re:  Copd  GOLD II/ symb 160 2bid Chief Complaint  Patient presents with  . Follow-up    Breathing has been worse recently.  She also has had some wheezing and cough with clear sputum.  She is in the process of moving and feels that this is making her symptoms worse. She is using her albuterol inhaler once daily on average.   needing albuterol in afternoon s x sev weeks while packing / cleaning house in anticipation of moving to [facility]  Never needs saba noct  Doe = MMRC2 = can't walk a nl pace on a flat grade s sob but does fine slow and flat    No obvious day to day or daytime variability or assoc  purulent sputum or mucus plugs or hemoptysis or cp or chest tightness,  or overt sinus or hb symptoms. No unusual exp hx or h/o childhood pna/ asthma or knowledge of  premature birth.  Sleeping ok without nocturnal  or early am exacerbation  of respiratory  c/o's or need for noct saba. Also denies any obvious fluctuation of symptoms with weather or environmental changes or other aggravating or alleviating factors except as outlined above   Current Medications, Allergies, Complete Past Medical History, Past Surgical History, Family History, and Social History were reviewed in [employer] record.  ROS  The following are not active complaints unless bolded sore throat, dysphagia, dental problems, itching, sneezing,  nasal congestion or excess/ purulent secretions, ear ache,   fever, chills, sweats, unintended wt loss, classically pleuritic or exertional cp,  orthopnea pnd or leg swelling, presyncope, palpitations, abdominal pain, anorexia, nausea, vomiting, diarrhea  or change in bowel or bladder habits, change in stools or urine, dysuria,hematuria,  rash, arthralgias, visual complaints, headache, numbness, weakness or ataxia or problems with walking or coordination,  change in mood/affect or memory.                 Objective:   Physical Exam  Thin  amb anxious  wf nad   07/29/2012  105  >102 09/24/2012 > 10/26/2012  99 > 12/10/2012 99 > 104 03/14/2013 > 06/20/2013 107 > 109 07/28/2013 > 11/09/2013  109 > 11/10/2014   113 > 03/13/2015  113 > 04/10/2015   113 > 07/10/2015  114 > 11/08/2015   103 > 01/22/2016 96 > 05/19/2016  89 > 11/17/2016    90      Vital signs reviewed   -  - Note on arrival 02 sats  96% on RA       HEENT: nl dentition, turbinates bilaterally, and oropharynx. Nl external ear canals without cough reflex   NECK :  without JVD/Nodes/TM/ nl carotid upstrokes bilaterally   LUNGS: no acc muscle use,  Mildly kyphotic chest wall,  Junky sounding insp and exp rhonchi bilaterally    CV:  RRR  no s3 or murmur or increase in P2, and no edema   ABD:  soft and nontender with nl inspiratory excursion in the supine position. No bruits  or organomegaly appreciated, bowel sounds nl  MS:  Nl gait/ ext warm without deformities, calf tenderness, cyanosis or clubbing No obvious joint restrictions   SKIN: warm and dry without lesions    NEURO:  alert, approp, nl sensorium with  no motor or cerebellar deficits apparent.       I personally reviewed images and agree with radiology impression as follows:  CXR:  07/29/16 The lungs remain hyperinflated. Infiltrate in the right middle lobe is no longer evident. The left lung is clear. There is stable biapical pleural thickening. The heart and pulmonary vascularity are normal. There are calcifications in the wall of the aortic arch and descending thoracic aorta. The bony thorax is unremarkable.        Assessment & Plan:   Outpatient Encounter Prescriptions as of 11/17/2016  Medication Sig  . acetaminophen (TYLENOL) 500 MG tablet Take 1,000 mg by mouth every 6 (six) hours as needed for moderate pain or headache.  . ALPRAZolam (XANAX) 0.25 MG tablet TAKE 1/2 TO 1 TABLET BY MOUTH TWICE A DAY AS NEEDED FOR ANXIETY (SHORTNESS OF BREATH)  . baclofen (LIORESAL) 10 MG tablet Take 10 mg by mouth 3 (three) times daily as needed for muscle spasms.   Marland Kitchen BIOTIN PO Take 1 capsule by mouth at bedtime.   . Cholecalciferol (VITAMIN D) 1000 UNITS capsule Take 1,000 Units by mouth at bedtime.   . famotidine (PEPCID) 20 MG tablet Take 20 mg by mouth 2 (two) times daily.  Marland Kitchen ibuprofen (ADVIL,MOTRIN) 200 MG tablet Take 600-800 mg by mouth every 6 (six) hours as needed for moderate pain.  Marland Kitchen lidocaine (LIDODERM) 5 % Place 1 patch onto the skin daily as needed (NECK PAIN). Remove & Discard patch within 12 hours or as directed by MD  . losartan-hydrochlorothiazide (HYZAAR) 50-12.5 MG tablet Take 1 tablet by mouth daily. (Patient taking differently: Take 1 tablet by mouth at bedtime. )  . PROAIR HFA 108 (90 Base) MCG/ACT inhaler INHALE 1-2 PUFFS INTO THE LUNGS EVERY 4 (FOUR) HOURS AS NEEDED FOR WHEEZING OR  SHORTNESS OF BREATH.  . SYMBICORT 160-4.5 MCG/ACT inhaler TAKE 2 PUFFS FIRST THING IN AM AND THEN ANOTHER 2 PUFFS ABOUT 12 HOURS LATER.  . traZODone (DESYREL) 50 MG tablet TAKE 1 TABLET BY MOUTH EVERY DAY  . predniSONE (DELTASONE) 10 MG tablet Take  4 each am x 2 days,   2 each am x 2 days,  1 each am x 2 days and stop  . [DISCONTINUED] zoledronic acid (RECLAST) 5 MG/100ML SOLN Inject 5 mg into the vein once. Every year   No facility-administered encounter medications on file as of 11/17/2016.

## 2016-11-17 NOTE — Assessment & Plan Note (Signed)
-   PFT's 07/31/09 FEV1  1.36 (74%) ratio 45 and no better p B2,  DLCO 66%  - quit smoking June 2013 - PFT's 07/29/2012 FEV1 1.10 (63%) ratio 46 and no change p B2 and DLCO 57%  - 10/26/2012  Walked RA x 3 laps @ 185 ft each stopped due to end of study, no desats - Trial off spiriva 03/14/2013 > rechallenged 06/20/12 due to worse doe > d/cd around 06/26/13 and no worse off it  - 03/13/2015 trained on respimat > 90% effective > changed to stiolto 2 pffs each am > preferred symbicort so 04/10/2015 rec bevespi trial  - 04/10/2015  Walked RA x 3 laps @ 185 ft each stopped due to  End of study, nl pace, no sob or desat/ did c/o leg fatigue bilaterally  - 07/10/2015    try spiriva respimat 2 each am > unable to tolerate mouth effects so d/c'd 08/2015  - 05/19/2016  After extensive coaching HFA effectiveness =    90%    Since she can't tolerate laba will continue symbicort 160 2bid and rx with short courses of prednisone prn flares noting also on reclast to reduce steroid induced bone loss   Moving to [facility] where will need to establish with a pulmonologist   .I had an extended final summary discussion with the patient reviewing all relevant studies completed to date and  lasting 15 to 20 minutes of a 25 minute visit on the following issues:   Each maintenance medication was reviewed in detail including most importantly the difference between maintenance and as needed and under what circumstances the prns are to be used.  Please see AVS for specific  Instructions which are unique to this visit and I personally typed out  which were reviewed in detail in writing with the patient and a copy provided.

## 2016-11-20 ENCOUNTER — Telehealth: Payer: Self-pay | Admitting: Internal Medicine

## 2016-11-20 NOTE — Telephone Encounter (Signed)
Pt called stating that they are moving to [place] on 12/08/16. She was wanting to know if we could mail her a paper prescription for her traZODone (DESYREL) 50 MG tablet so that she can take it with her to have filled at a pharmacy up there?

## 2016-11-20 NOTE — Telephone Encounter (Signed)
Please advise 

## 2016-11-24 ENCOUNTER — Ambulatory Visit (INDEPENDENT_AMBULATORY_CARE_PROVIDER_SITE_OTHER): Payer: Medicare Other | Admitting: Physical Medicine and Rehabilitation

## 2016-11-24 ENCOUNTER — Encounter (INDEPENDENT_AMBULATORY_CARE_PROVIDER_SITE_OTHER): Payer: Self-pay | Admitting: Physical Medicine and Rehabilitation

## 2016-11-24 ENCOUNTER — Ambulatory Visit (INDEPENDENT_AMBULATORY_CARE_PROVIDER_SITE_OTHER): Payer: Medicare Other

## 2016-11-24 VITALS — BP 110/72 | HR 72

## 2016-11-24 DIAGNOSIS — M791 Myalgia: Secondary | ICD-10-CM | POA: Diagnosis not present

## 2016-11-24 DIAGNOSIS — M7918 Myalgia, other site: Secondary | ICD-10-CM

## 2016-11-24 DIAGNOSIS — G8929 Other chronic pain: Secondary | ICD-10-CM

## 2016-11-24 DIAGNOSIS — M25511 Pain in right shoulder: Secondary | ICD-10-CM

## 2016-11-24 DIAGNOSIS — M546 Pain in thoracic spine: Secondary | ICD-10-CM

## 2016-11-24 DIAGNOSIS — M5414 Radiculopathy, thoracic region: Secondary | ICD-10-CM

## 2016-11-24 MED ORDER — LIDOCAINE HCL (PF) 1 % IJ SOLN
2.0000 mL | Freq: Once | INTRAMUSCULAR | Status: AC
  Administered 2016-11-24: 2 mL

## 2016-11-24 MED ORDER — BACLOFEN 10 MG PO TABS: 10.0000 mg | ORAL_TABLET | Freq: Three times a day (TID) | ORAL | 3 refills | Status: AC | PRN

## 2016-11-24 MED ORDER — METHYLPREDNISOLONE ACETATE 80 MG/ML IJ SUSP
80.0000 mg | Freq: Once | INTRAMUSCULAR | Status: AC
  Administered 2016-11-24: 80 mg

## 2016-11-24 NOTE — Progress Notes (Deleted)
Pain across back just below bra line.Comes and goes. Seems to be worse towards the end of the day. Keeps her up at night at times.

## 2016-11-24 NOTE — Telephone Encounter (Signed)
Please advise 

## 2016-11-24 NOTE — Patient Instructions (Signed)

## 2016-11-24 NOTE — Progress Notes (Unsigned)
Fluoro Time: 20 sec MGY: 4.32

## 2016-11-24 NOTE — Telephone Encounter (Signed)
Pt called back regarding this, she would like to know if this is possible she just wants to be covered for when she moves and while she is looking for a new PCP in [place] , she also states that she is ok if [facility] gets [facility] because she can get [facility] transferred [facility] another [facility] after she moves. She would like to know if someone else can approve this and she would like a call back

## 2016-11-25 ENCOUNTER — Encounter (INDEPENDENT_AMBULATORY_CARE_PROVIDER_SITE_OTHER): Payer: Self-pay | Admitting: Physical Medicine and Rehabilitation

## 2016-11-25 ENCOUNTER — Other Ambulatory Visit: Payer: Self-pay | Admitting: Internal Medicine

## 2016-11-25 DIAGNOSIS — M791 Myalgia: Secondary | ICD-10-CM | POA: Diagnosis not present

## 2016-11-25 DIAGNOSIS — M25511 Pain in right shoulder: Secondary | ICD-10-CM | POA: Diagnosis not present

## 2016-11-25 DIAGNOSIS — M546 Pain in thoracic spine: Secondary | ICD-10-CM | POA: Diagnosis not present

## 2016-11-25 MED ORDER — LIDOCAINE HCL 1 % IJ SOLN
3.0000 mL | INTRAMUSCULAR | Status: AC | PRN
  Administered 2016-11-25: 3 mL

## 2016-11-25 MED ORDER — TRAZODONE HCL 50 MG PO TABS: 50.0000 mg | ORAL_TABLET | Freq: Every day | ORAL | 0 refills | Status: AC

## 2016-11-25 NOTE — Telephone Encounter (Signed)
RX printed 

## 2016-11-25 NOTE — Telephone Encounter (Signed)
RX faxed per pt request

## 2016-11-25 NOTE — Procedures (Signed)
Thoracic Epidural Steroid Injection - Interlaminar Approach with Fluoroscopic Guidance  Patient: Amber Snow      Date of Birth: July 22, 1932 MRN: [id number] PCP: Cassandria Anger, MD      Visit Date: 11/24/2016   Universal Protocol:    Date/Time: 11/24/16  Consent Given By: the patient  Position: PRONE  Additional Comments: Vital signs were monitored before and after the procedure. Patient was prepped and draped in the usual sterile fashion. The correct patient, procedure, and site was verified.   Injection Procedure Details:  Procedure Site One Meds Administered:  Meds ordered this encounter  Medications  . lidocaine (PF) (XYLOCAINE) 1 % injection 2 mL  . methylPREDNISolone acetate (DEPO-MEDROL) injection 80 mg  . baclofen (LIORESAL) 10 MG tablet    Sig: Take 1 tablet (10 mg total) by mouth 3 (three) times daily as needed for muscle spasms.    Dispense:  90 each    Refill:  3     Laterality: Left  Location/Site:  T2-3  Needle size: 20 G  Needle type: Touhy  Needle Placement: Paramedian epidural space  Findings:  -Contrast Used: 1 mL iohexol 180 mg iodine/mL   -Comments: Excellent flow of contrast into the epidural space.  Procedure Details: Using a paramedian approach from the side mentioned above, the region overlying the inferior lamina was localized under fluoroscopic visualization and the soft tissues overlying this structure were infiltrated with 4 ml. of 1% Lidocaine without Epinephrine. A # 20 gauge, Tuohy needle was inserted into the epidural space using a paramedian approach.  The epidural space was localized using loss of resistance along with lateral and contralateral oblique bi-planar fluoroscopic views.  After negative aspirate for air, blood, and CSF, a 2 ml. volume of Isovue-250 was injected into the epidural space and the flow of contrast was observed. Radiographs were obtained for documentation purposes.   The injectate was administered  into the level noted above.  Additional Comments:  The patient tolerated the procedure well Dressing: Band-Aid    Post-procedure details: Patient was observed during the procedure. Post-procedure instructions were reviewed.  Patient left the clinic in stable condition.

## 2016-11-25 NOTE — Progress Notes (Signed)
Amber Snow - 83 y.o. female MRN [id number]  Date of birth: [birth year]  Office Visit Note: Visit Date: 11/24/2016 PCP: Cassandria Anger, MD Referred by: Cassandria Anger, MD  Subjective: Chief Complaint  Patient presents with  . Middle Back - Pain   HPI: Amber Snow is a very pleasant, 83 year old female with history of ongoing bladder cancer and COPD as well as myofascial pain syndrome and degenerative changes of the spine particularly of the cervical spine. We last saw her in May and completed trigger point injections which were beneficial around the upper cervical spine and upper back. She does use a lot of accessory muscles for breathing. She is in general an anxious person. She's been under a lot of stress lately with having to pack up to move back to [place] where her family is from. She is moving in 2 weeks. She reports moving boxes really flared all of her pain up and she's really hurting from her neck down to her lower back and even hip and leg. By way of review of her cervical spine shows significant degenerative changes with disc herniation particularly T1-2. She's had significant myofascial pain particularly in the upper back and around the shoulder blades and accessory breathing muscles. She's also had some low back pain which is been facet mediated pain without any levels of stenosis. Today her biggest complaint is thoracic pain really at about the mid thoracic region. She gets some referral pattern into the rib. She denies specific neck pain although it is bothering her. She has one area over the infraspinatus muscle on the right giving her a lot of shoulder pain and this is clear trigger point on exam. She has muscle tightness throughout the thoracic spine. She is looking for someone to take over her pain complaints while she is in [place]. She has not had any bowel or bladder symptoms or new symptoms. She is unable fevers chills or night sweats. She is very thin-appearing female  but does not report any weight changes recently.    Review of Systems  Constitutional: Negative for chills, fever, malaise/fatigue and weight loss.  HENT: Negative for hearing loss and sinus pain.   Eyes: Negative for blurred vision, double vision and photophobia.  Respiratory: Negative for cough and shortness of breath.   Cardiovascular: Negative for chest pain, palpitations and leg swelling.  Gastrointestinal: Negative for abdominal pain, nausea and vomiting.  Genitourinary: Negative for flank pain.  Musculoskeletal: Positive for back pain, joint pain and neck pain. Negative for myalgias.  Skin: Negative for itching and rash.  Neurological: Negative for tremors, focal weakness and weakness.  Endo/Heme/Allergies: Negative.   Psychiatric/Behavioral: Negative for depression.  All other systems reviewed and are negative.  Otherwise per HPI.  Assessment & Plan: Visit Diagnoses:  1. Chronic midline thoracic back pain   2. Thoracic radiculopathy   3. Myofascial pain syndrome   4. Chronic right shoulder pain     Plan: Findings:  Chronic worsening upper thoracic and mid thoracic pain which is a combination probably of the significant disc herniation at T1-2 as well as myofascial pain syndrome with trigger points and taut bands in the paraspinal musculature. He is also having right shoulder pain which in the past is been somewhat of an impingement syndrome and bursitis. Today it is more related to a trigger point in the infraspinatus muscle that does reproduce a lot of her referral pattern. Not much in way of impingement on exam. She has good distal strength  and no other strength issues. Still frail-appearing in general with COPD and bladder cancer. I think a lot of her pain is from recent increased activity as well as stress of moving. Today we are going to complete epidural injection at T1-2 which was done in the past with good relief. I'm also going to complete trigger point injections in the  right supraspinatus as well as thoracic paraspinal musculature. She is very anxious about trying to get an with [place] again before her trip. I told her this point we decided to see how she's doing. I hope she can find someone where she is moving to have actually looked in the past in the area where she is moving and there are linear providers in the area would be happy to help her try to find someone when she gets there. I also renewed her baclofen prescription with several refills. I spent more than 20  minutes speaking face-to-face with the patient with 50% of the time in counseling.    Meds & Orders:  Meds ordered this encounter  Medications  . lidocaine (PF) (XYLOCAINE) 1 % injection 2 mL  . methylPREDNISolone acetate (DEPO-MEDROL) injection 80 mg  . baclofen (LIORESAL) 10 MG tablet    Sig: Take 1 tablet (10 mg total) by mouth 3 (three) times daily as needed for muscle spasms.    Dispense:  90 each    Refill:  3    Orders Placed This Encounter  Procedures  . Trigger Point Injection  . XR C-ARM NO REPORT  . Epidural Steroid injection    Follow-up: Return if symptoms worsen or fail to improve.   Procedures: Trigger Point Inj Date/Time: 11/25/2016 6:12 AM Performed by: Magnus Sinning Authorized by: Magnus Sinning   Consent Given by:  Patient Site marked: the procedure site was marked   Timeout: prior to procedure the correct patient, procedure, and site was verified   Total # of Trigger Points:  3 or more Location: back   Needle Size:  25 G Approach:  Dorsal Medications #2:  3 mL lidocaine 1 % Additional Injections?: No   Patient tolerance:  Patient tolerated the procedure well with no immediate complications Comments: Active trigger points were identified in the right supraspinatus muscle as well as the paraspinal musculature bilaterally with the if the dye and trapezius time and in the thoracic paraspinal region. A needling technique for trigger points was utilized.      Thoracic Epidural Steroid Injection - Interlaminar Approach with Fluoroscopic Guidance  Patient: Amber Snow      Date of Birth: 1933-01-11 MRN: [id number] PCP: Cassandria Anger, MD      Visit Date: 11/24/2016   Universal Protocol:    Date/Time: 11/24/16  Consent Given By: the patient  Position: PRONE  Additional Comments: Vital signs were monitored before and after the procedure. Patient was prepped and draped in the usual sterile fashion. The correct patient, procedure, and site was verified.   Injection Procedure Details:  Procedure Site One Meds Administered:  Meds ordered this encounter  Medications  . lidocaine (PF) (XYLOCAINE) 1 % injection 2 mL  . methylPREDNISolone acetate (DEPO-MEDROL) injection 80 mg  . baclofen (LIORESAL) 10 MG tablet    Sig: Take 1 tablet (10 mg total) by mouth 3 (three) times daily as needed for muscle spasms.    Dispense:  90 each    Refill:  3     Laterality: Left  Location/Site:  T2-3  Needle size: 20 G  Needle  type: Touhy  Needle Placement: Paramedian epidural space  Findings:  -Contrast Used: 1 mL iohexol 180 mg iodine/mL   -Comments: Excellent flow of contrast into the epidural space.  Procedure Details: Using a paramedian approach from the side mentioned above, the region overlying the inferior lamina was localized under fluoroscopic visualization and the soft tissues overlying this structure were infiltrated with 4 ml. of 1% Lidocaine without Epinephrine. A # 20 gauge, Tuohy needle was inserted into the epidural space using a paramedian approach.  The epidural space was localized using loss of resistance along with lateral and contralateral oblique bi-planar fluoroscopic views.  After negative aspirate for air, blood, and CSF, a 2 ml. volume of Isovue-250 was injected into the epidural space and the flow of contrast was observed. Radiographs were obtained for documentation purposes.   The injectate was administered  into the level noted above.  Additional Comments:  The patient tolerated the procedure well Dressing: Band-Aid    Post-procedure details: Patient was observed during the procedure. Post-procedure instructions were reviewed.  Patient left the clinic in stable condition.     Clinical History: Lumbar spine MRI 09/10/2016  IMPRESSION: Advanced multilevel degenerative change throughout the lumbar spine with disc space narrowing and diffuse spurring. Reversal of the lumbar lordosis.  Left foraminal and extraforaminal osteophyte at L3-4 displacing the left L3 nerve root. Subarticular stenosis on the left  Left subarticular stenosis L4-5 due to spurring and ligament flavum hypertrophy. Mild foraminal narrowing bilaterally  Mild subarticular and foraminal stenosis bilaterally L5-S1 due to spurring.   Cervical spine MRI 08/19/2015  IMPRESSION: 1. Progressed retrolisthesis at C5-C6 and disc degeneration at C3-C4 and C6-C7 since 2014. Subsequent increased mild spinal stenosis at C3-C4 with no spinal cord mass effect, and increased mild to moderate left C6 neural foraminal stenosis. 2. New or increased T1-T2 disc herniation with new mild spinal stenosis. 3. Stable degenerative moderate to severe neural foraminal stenosis at the bilateral C4 and C5 nerve levels.  She reports that she quit smoking about 5 years ago. Her smoking use included Cigarettes. She has a 50.00 pack-year smoking history. She has never used smokeless tobacco. No results for input(s): HGBA1C, LABURIC in the last 8760 hours.  Objective:  VS:  HT:    WT:   BMI:     BP:110/72  HR:72bpm  TEMP: ( )  RESP:96 % Physical Exam  Constitutional: She is oriented to person, place, and time.  Very thin appearing and anxious  HENT:  Head: Normocephalic and atraumatic.  Eyes: Pupils are equal, round, and reactive to light. Conjunctivae and EOM are normal.  Cardiovascular: Normal rate and intact distal pulses.     Pulmonary/Chest: Effort normal.  Musculoskeletal:  Examination of the thoracic spine reveals increased kyphosis but no scoliosis. There is paraspinal tenderness at about the T6 region with active trigger points. There is an active trigger point in the right supraspinatus musculature that does reproduce a lot of her shoulder pain. She has some decreased range of motion of the right shoulder with external rotation but no real impingement sign. She has good strength in the upper extremities bilaterally without any deficits. She has a negative Hoffmann's test bilaterally.  Neurological: She is alert and oriented to person, place, and time. She exhibits normal muscle tone.  Skin: Skin is warm and dry. No rash noted. No erythema.  Psychiatric: She has a normal mood and affect. Her behavior is normal.  Nursing note and vitals reviewed.   Ortho Exam Imaging: Xr C-arm  No Report  Result Date: 11/24/2016 Please see Notes or Procedures tab for imaging impression.   Past Medical/Family/Surgical/Social History: Medications & Allergies reviewed per EMR Patient Active Problem List   Diagnosis Date Noted  . Cramps, extremity 09/26/2016  . Weight loss 06/26/2016  . Cervical disc disorder with radiculopathy 12/15/2015  . Cervical spondylosis with myelopathy 12/15/2015  . COPD exacerbation ([place]) 11/10/2015  . Essential hypertension 11/10/2015  . Oral candidiasis 08/09/2015  . Weakness 02/21/2015  . Edema 11/08/2014  . Acute bronchitis 07/17/2014  . Cellulitis of leg, left 12/28/2013  . Laceration of leg 12/15/2013  . CAD (coronary artery disease) 09/02/2013  . Well adult exam 07/06/2013  . Dizzy 05/10/2013  . Chronic systolic CHF (congestive heart failure) ([place]) 12/30/2012  . Sepsis ([place]) 09/16/2012  . CAP (community acquired pneumonia) 09/16/2012  . Nonischemic cardiomyopathy ([place]) 06/02/2012  . Lung nodule 06/01/2012  . Bladder cancer ([place]) 12/17/2011  . LV dysfunction 09/26/2011  . Dyspnea  08/13/2011  . PAD (peripheral artery disease) ([place]) 08/13/2011  . Murmur 08/13/2011  . Other dysphagia 09/09/2010  . LOW BACK PAIN, CHRONIC 10/17/2009  . Low back pain radiating to both legs 10/17/2009  . THYROID FUNCTION TEST, ABNORMAL 07/19/2009  . LEUKOCYTOSIS 07/02/2009  . Nonspecific (abnormal) findings on radiological and other examination of body structure 07/02/2009  . Fatigue 06/29/2009  . Vitamin D deficiency 11/20/2008  . ARTHRALGIA 11/20/2008  . UNSPECIFIED MYALGIA AND MYOSITIS 11/20/2008  . OTH SYMPTOMS INVLV NERV&MUSCULOSKELETAL SYSTEMS 03/15/2008  . LBBB (left bundle branch block) 02/14/2008  . OSTEOPENIA 11/12/2007  . RENAL CALCULUS 08/16/2007  . DIVERTICULOSIS, COLON 12/25/2006  . HYPERLIPIDEMIA 10/02/2006  . COPD GOLD II  10/02/2006   Past Medical History:  Diagnosis Date  . Arthritis HANDS  . Bladder calculus   . Bladder cancer ([place])    Chemo last 10'17. -[facility]  . Chronic systolic CHF (congestive heart failure) ([place])   . Diverticulosis of colon   . Dyspnea    with exertion only- intermittent cough  . Dyspnea on exertion   . GERD (gastroesophageal reflux disease)   . Hearing impaired person, bilateral    wears hearing aids bilateral  . History of kidney stones   . History of nephrolithiasis   . Hyperlipidemia   . LBBB (left bundle branch block)    CHRONIC  . Myalgia   . Neck pain    07-30-15 at present and flares periodically"? stenosis"  . Nocturia   . Nodule of right lung    APICAL 1.0CM (MONITORED BY DR Melvyn Novas)  . Nonischemic cardiomyopathy ([place])   . Osteopenia   . PAC (premature atrial contraction)   . Pneumonia 1-2 YEARS AGO  . Wears hearing aid    BILATERAL   Family History  Problem Relation Age of Onset  . Heart disease Mother   . Hypertension Mother   . Hypertension Father   . Heart attack Father   . Heart disease Father   . Thyroid disease Sister   . Heart attack Sister   . Stroke Sister   . COPD Sister   . Heart attack Brother     . Breast cancer Maternal Grandmother   . Diabetes Paternal Grandmother   . Asthma Neg Hx    Past Surgical History:  Procedure Laterality Date  . CARDIAC CATHETERIZATION  09-12-2011  DR Martinique   MINIMAL NONOBSTRUCTIVE CAD/ MODERATE TO SEVERE LV DYSFUNCTION/  NORMAL RIGHT HEART PRESSURES AND LV FILLING PRESSURES  . CATARACT EXTRACTION W/ INTRAOCULAR LENS  IMPLANT, BILATERAL  1998  . COSMETIC SURGERY     eyelids  . CYSTO/ LEFT RETROGRADE PYELOGRAM/ LEFT URETERAL STENT PLACEMENT  10-16-2001   STONE  . CYSTOSCOPY  07/01/2011   Procedure: CYSTOSCOPY;  Surgeon: Hanley Ben, MD;  Location: [facility];  Service: Urology;  Laterality: N/A;  retrieval l of bladder stone  . CYSTOSCOPY N/A 08/20/2012   Procedure: CYSTOSCOPY;  Surgeon: Hanley Ben, MD;  Location: [facility];  Service: Urology;  Laterality: N/A;  fulgeration of bladderm tumors  . CYSTOSCOPY N/A 06/23/2014   Procedure: CYSTOSCOPY;  Surgeon: Arvil Persons, MD;  Location: [facility];  Service: Urology;  Laterality: N/A;  . CYSTOSCOPY W/ RETROGRADES Right 04/01/2013   Procedure: CYSTOSCOPY WITH RETROGRADE PYELOGRAM;  Surgeon: Hanley Ben, MD;  Location: [facility];  Service: Urology;  Laterality: Right;  . CYSTOSCOPY WITH BIOPSY N/A 02/06/2015   Procedure: CYSTO WITH BLADDER BIOPSY;  Surgeon: Festus Aloe, MD;  Location: [facility];  Service: Urology;  Laterality: N/A;  . CYSTOSCOPY WITH BIOPSY N/A 04/04/2016   Procedure: CYSTOSCOPY WITH BIOPSY AND FULGURATION EPIRUBICIN BLADDER INSTILLATION;  Surgeon: Festus Aloe, MD;  Location: [facility];  Service: Urology;  Laterality: N/A;  . CYSTOSCOPY WITH FULGERATION N/A 11/27/2015   Procedure: CYSTOSCOPY WITH FULGERATION BLADDER BIOPSY ;  Surgeon: Festus Aloe, MD;  Location: [facility];  Service: Urology;  Laterality: N/A;  . CYSTOSCOPY WITH LITHOLAPAXY N/A 04/01/2013   Procedure: CYSTOSCOPY WITH HOLMIUM LASER OF  BLADDER  CALCULUS;  Surgeon: Hanley Ben, MD;  Location: [place];  Service: Urology;  Laterality: N/A;  . CYSTOSCOPY WITH RETROGRADE PYELOGRAM, URETEROSCOPY AND STENT PLACEMENT Bilateral 07/31/2015   Procedure: CYSTOSCOPY WITH FULGERATION BLADDER BIOPSY, CYSTOSCOPY WITH BILATERAL RETROGRADE PYELOGRAMS;  Surgeon: Festus Aloe, MD;  Location: [facility];  Service: Urology;  Laterality: Bilateral;  . EXTRACORPOREAL SHOCK WAVE LITHOTRIPSY  2003  . FULGURATION OF BLADDER TUMOR  06/23/2014   Procedure: FULGURATION OF BLADDER TUMOR;  Surgeon: Arvil Persons, MD;  Location: [facility];  Service: Urology;;  . FULGURATION OF BLADDER TUMOR N/A 02/06/2015   Procedure: FULGURATION OF BLADDER TUMORS;  Surgeon: Festus Aloe, MD;  Location: [facility];  Service: Urology;  Laterality: N/A;  . HOLMIUM LASER APPLICATION N/A 76/04/6071   Procedure: HOLMIUM LASER APPLICATION;  Surgeon: Hanley Ben, MD;  Location: [place];  Service: Urology;  Laterality: N/A;  . LEFT AND RIGHT HEART CATHETERIZATION WITH CORONARY ANGIOGRAM Bilateral 09/12/2011   Procedure: LEFT AND RIGHT HEART CATHETERIZATION WITH CORONARY ANGIOGRAM;  Surgeon: Peter M Martinique, MD;  Location: [facility];  Service: Cardiovascular;  Laterality: Bilateral;  . LEFT LONG FINGER ARTHROPLASTY & PULLEY RELASE  02-17-2005  . ORIF FINGER / THUMB FRACTURE    . PULMONARY FUNCTION ANALYSIS  07-31-2009   MODERATE TO SEVERE OBSTRUCTIVE AIRWAY DISEASE/ INSIGNIFICANT RESPONSE TO BRONCHODILATORS/ EMPHYSEMA PATTERN/ MILD DECREASE IN DIFFUSE CAPACITY FEF 25-75 CHANGED BY 8%  . TONSILLECTOMY  CHILD  . TOOTH EXTRACTION  01-17-15   LOWER TOOTH CROWN  BROKE OFF AND WAS EXTRACTED  . TRANSTHORACIC ECHOCARDIOGRAM  last one 03-15-2014  DR Martinique   GRADE I DIASTOLIC DYSFUNCTION/  confirmed by dr Liane Comber  EF 45% (down from 60-65% 03-12-2013 but up from 25-30% compared to last echo 10-01-2012 /  NORMAL LV SIZE AND WALL THICKNESS/  trivial  TR  . TRANSURETHRAL RESECTION OF BLADDER TUMOR  07/01/2011   Procedure: TRANSURETHRAL RESECTION OF BLADDER TUMOR (TURBT);  Surgeon: Kirtland Bouchard  Nesi, MD;  Location: [place];  Service: Urology;  Laterality: N/A;  CYSTO  . TRANSURETHRAL RESECTION OF BLADDER TUMOR N/A 02/06/2015   Procedure:  TRANSURETHRAL RESECTION OF BLADDER TUMOR (TURBT) GREATER THAN 5 CM; RIGHT RETROGRADE, RIGHT STENT PLACEMENT;  Surgeon: Festus Aloe, MD;  Location[facility];  Service: Urology;  Laterality: N/A;  . TRANSURETHRAL RESECTION OF BLADDER TUMOR N/A 07/31/2015   Procedure:  TRANSURETHRAL RESECTION OF BLADDER TUMOR (TURBT); REMOVAL OF DYSTROPHIC CALCIFICATIONS;  Surgeon: Festus Aloe, MD;  [facility];  Service: Urology;  Laterality: N/A;  . TRANSURETHRAL RESECTION OF BLADDER TUMOR N/A 11/27/2015   Procedure: TRANSURETHRAL RESECTION OF BLADDER TUMOR (TURBT);  Surgeon: Festus Aloe, MD;  [facility];  Service: Urology;  Laterality: N/A;  . TRANSURETHRAL RESECTION OF BLADDER TUMOR WITH MITOMYCIN-C N/A 08/26/2016   Procedure: CYSTOSCOPY TRANSURETHRAL RESECTION OF BLADDER TUMOR WITH EPIRUBACIN;  Surgeon: Festus Aloe, MD;  Location[facility];  Service: Urology;  Laterality: N/A;  . TUBAL LIGATION  AGE 29'S   Social History   Occupational History  . Retired     Government social research officer Asst   Social History Main Topics  . Smoking status: Former Smoker    Packs/day: 1.00    Years: 50.00    Types: Cigarettes    Quit date: 09/27/2011  . Smokeless tobacco: Never Used  . Alcohol use Yes     Comment: wine rarely  . Drug use: No  . Sexual activity: Not Currently

## 2016-11-26 ENCOUNTER — Telehealth: Payer: Self-pay | Admitting: Internal Medicine

## 2016-11-26 NOTE — Telephone Encounter (Signed)
Spoke with pt, aware of recs.  Offered pt appt as early as tomorrow morning but pt wishes to wait until MW's next available which is on Monday.  Pt scheduled, aware to seek UC or ED if s/s worsen.  Nothing further needed at this time.

## 2016-11-26 NOTE — Telephone Encounter (Signed)
If didn't get better with prednisone or the resp meds on her med sheet then I can't treat this over the phone > will need ov with all meds in hand and if gets worse before 1st avail > go to ER

## 2016-11-26 NOTE — Telephone Encounter (Signed)
Pt c/o increased SOB, wheezing X1 week.  Pt was given pred taper on 7/23 OV with MW which has not improved s/s.  Pt has completed pred taper.   Denies fever, chest pain, mucus production.  Pt states her wheezing has worsened to where she hears it as well as people in close proximity to her .  Requesting further recs.  Uses [facility] on [address]    MW please advise.  Thanks!   7/23 AVS: Instructions  Prednisone 10 mg take  4 each am x 2 days,   2 each am x 2 days,  1 each am x 2 days and stop    You will need to establish with a board certified pulmonary doctor in [place] - call for records when you establish - call us in meantime if needed

## 2016-11-27 ENCOUNTER — Encounter: Payer: Self-pay | Admitting: Internal Medicine

## 2016-11-27 ENCOUNTER — Telehealth: Payer: Self-pay | Admitting: Internal Medicine

## 2016-11-27 ENCOUNTER — Ambulatory Visit (INDEPENDENT_AMBULATORY_CARE_PROVIDER_SITE_OTHER): Payer: Medicare Other | Admitting: Internal Medicine

## 2016-11-27 VITALS — BP 110/70 | HR 85 | Ht 63.0 in | Wt 87.0 lb

## 2016-11-27 DIAGNOSIS — J449 Chronic obstructive pulmonary disease, unspecified: Secondary | ICD-10-CM

## 2016-11-27 DIAGNOSIS — I10 Essential (primary) hypertension: Secondary | ICD-10-CM | POA: Diagnosis not present

## 2016-11-27 DIAGNOSIS — R42 Dizziness and giddiness: Secondary | ICD-10-CM | POA: Insufficient documentation

## 2016-11-27 MED ORDER — LOSARTAN POTASSIUM-HCTZ 50-12.5 MG PO TABS: ORAL_TABLET | ORAL | 3 refills | Status: AC

## 2016-11-27 MED ORDER — MECLIZINE HCL 12.5 MG PO TABS: 12.5000 mg | ORAL_TABLET | Freq: Three times a day (TID) | ORAL | 1 refills | Status: AC | PRN

## 2016-11-27 NOTE — Progress Notes (Signed)
Subjective:    Patient ID: Amber Snow, female    DOB: May 02, 1932, 83 y.o.   MRN: [id number]  HPI  83 yo F with hx of bladder ca and cyst every 4 mo, with last done 2 wks ago negative for malignancy recurrence per pt per Dr Junious Silk, since then has had reduced po intake and several lb wt loss for unclear reasons except with reduced appetite.  Pt denies fever, night sweats.  Denies worsening depressive symptoms, suicidal ideation, or panic.  Here today to c/o sense of lightheadedness but not clearly vertigo and not assoc with presyncope/syncope or gait disorder or falls for 2 wks.  Is on losartan HCT and has been taking regularly. Pt denies chest pain, increased sob or doe, wheezing, orthopnea, PND, increased LE swelling, palpitations, dizziness or syncope.  No recent overt bleeding or anticoagulant use.  Denies worsening reflux, abd pain, dysphagia, n/v, bowel change.  Pt denies chest pain, increased sob or doe, wheezing, orthopnea, PND, increased LE swelling, palpitations, dizziness or syncope.  Pt denies new neurological symptoms such as new headache, or facial or extremity weakness or numbness Wt Readings from Last 3 Encounters:  11/27/16 87 lb (39.5 kg)  11/17/16 90 lb 6.4 oz (41 kg)  10/01/16 90 lb 12.8 oz (41.2 kg)   BP Readings from Last 3 Encounters:  11/27/16 110/70  11/24/16 110/72  11/17/16 138/60   Past Medical History:  Diagnosis Date  . Arthritis HANDS  . Bladder calculus   . Bladder cancer ([place])    Chemo last 10'17. -[facility]  . Chronic systolic CHF (congestive heart failure) ([place])   . Diverticulosis of colon   . Dyspnea    with exertion only- intermittent cough  . Dyspnea on exertion   . GERD (gastroesophageal reflux disease)   . Hearing impaired person, bilateral    wears hearing aids bilateral  . History of kidney stones   . History of nephrolithiasis   . Hyperlipidemia   . LBBB (left bundle branch block)    CHRONIC  . Myalgia   . Neck pain    07-30-15 at  present and flares periodically"? stenosis"  . Nocturia   . Nodule of right lung    APICAL 1.0CM (MONITORED BY DR Melvyn Novas)  . Nonischemic cardiomyopathy ([place])   . Osteopenia   . PAC (premature atrial contraction)   . Pneumonia 1-2 YEARS AGO  . Wears hearing aid    BILATERAL   Past Surgical History:  Procedure Laterality Date  . CARDIAC CATHETERIZATION  09-12-2011  DR Martinique   MINIMAL NONOBSTRUCTIVE CAD/ MODERATE TO SEVERE LV DYSFUNCTION/  NORMAL RIGHT HEART PRESSURES AND LV FILLING PRESSURES  . CATARACT EXTRACTION W/ INTRAOCULAR LENS  IMPLANT, BILATERAL  1998  . COSMETIC SURGERY     eyelids  . CYSTO/ LEFT RETROGRADE PYELOGRAM/ LEFT URETERAL STENT PLACEMENT  10-16-2001   STONE  . CYSTOSCOPY  07/01/2011   Procedure: CYSTOSCOPY;  Surgeon: Hanley Ben, MD;  Location: [facility];  Service: Urology;  Laterality: N/A;  retrieval l of bladder stone  . CYSTOSCOPY N/A 08/20/2012   Procedure: CYSTOSCOPY;  Surgeon: Hanley Ben, MD;  Location: [facility];  Service: Urology;  Laterality: N/A;  fulgeration of bladderm tumors  . CYSTOSCOPY N/A 06/23/2014   Procedure: CYSTOSCOPY;  Surgeon: Arvil Persons, MD;  Location: [facility];  Service: Urology;  Laterality: N/A;  . CYSTOSCOPY W/ RETROGRADES Right 04/01/2013   Procedure: CYSTOSCOPY WITH RETROGRADE PYELOGRAM;  Surgeon: Hanley Ben,  MD;  Location: [place];  Service: Urology;  Laterality: Right;  . CYSTOSCOPY WITH BIOPSY N/A 02/06/2015   Procedure: CYSTO WITH BLADDER BIOPSY;  Surgeon: Festus Aloe, MD;  Location: [facility];  Service: Urology;  Laterality: N/A;  . CYSTOSCOPY WITH BIOPSY N/A 04/04/2016   Procedure: CYSTOSCOPY WITH BIOPSY AND FULGURATION EPIRUBICIN BLADDER INSTILLATION;  Surgeon: Festus Aloe, MD;  Location: [facility];  Service: Urology;  Laterality: N/A;  . CYSTOSCOPY WITH FULGERATION N/A 11/27/2015   Procedure: CYSTOSCOPY WITH FULGERATION BLADDER BIOPSY ;  Surgeon:  Festus Aloe, MD;  Location: [facility];  Service: Urology;  Laterality: N/A;  . CYSTOSCOPY WITH LITHOLAPAXY N/A 04/01/2013   Procedure: CYSTOSCOPY WITH HOLMIUM LASER OF  BLADDER CALCULUS;  Surgeon: Hanley Ben, MD;  Location: [place];  Service: Urology;  Laterality: N/A;  . CYSTOSCOPY WITH RETROGRADE PYELOGRAM, URETEROSCOPY AND STENT PLACEMENT Bilateral 07/31/2015   Procedure: CYSTOSCOPY WITH FULGERATION BLADDER BIOPSY, CYSTOSCOPY WITH BILATERAL RETROGRADE PYELOGRAMS;  Surgeon: Festus Aloe, MD;  Location: [facility];  Service: Urology;  Laterality: Bilateral;  . EXTRACORPOREAL SHOCK WAVE LITHOTRIPSY  2003  . FULGURATION OF BLADDER TUMOR  06/23/2014   Procedure: FULGURATION OF BLADDER TUMOR;  Surgeon: Arvil Persons, MD;  Location: [facility];  Service: Urology;;  . FULGURATION OF BLADDER TUMOR N/A 02/06/2015   Procedure: FULGURATION OF BLADDER TUMORS;  Surgeon: Festus Aloe, MD;  Location: [facility];  Service: Urology;  Laterality: N/A;  . HOLMIUM LASER APPLICATION N/A [id number]   Procedure: HOLMIUM LASER APPLICATION;  Surgeon: Hanley Ben, MD;  Location: [place];  Service: Urology;  Laterality: N/A;  . LEFT AND RIGHT HEART CATHETERIZATION WITH CORONARY ANGIOGRAM Bilateral 09/12/2011   Procedure: LEFT AND RIGHT HEART CATHETERIZATION WITH CORONARY ANGIOGRAM;  Surgeon: Peter M Martinique, MD;  Location: [facility];  Service: Cardiovascular;  Laterality: Bilateral;  . LEFT LONG FINGER ARTHROPLASTY & PULLEY RELASE  02-17-2005  . ORIF FINGER / THUMB FRACTURE    . PULMONARY FUNCTION ANALYSIS  07-31-2009   MODERATE TO SEVERE OBSTRUCTIVE AIRWAY DISEASE/ INSIGNIFICANT RESPONSE TO BRONCHODILATORS/ EMPHYSEMA PATTERN/ MILD DECREASE IN DIFFUSE CAPACITY FEF 25-75 CHANGED BY 8%  . TONSILLECTOMY  CHILD  . TOOTH EXTRACTION  01-17-15   LOWER TOOTH CROWN  BROKE OFF AND WAS EXTRACTED  . TRANSTHORACIC ECHOCARDIOGRAM  last one 03-15-2014  DR Martinique   GRADE I  DIASTOLIC DYSFUNCTION/  confirmed by dr Liane Comber  EF 45% (down from 60-65% 03-12-2013 but up from 25-30% compared to last echo 10-01-2012 /  NORMAL LV SIZE AND WALL THICKNESS/  trivial TR  . TRANSURETHRAL RESECTION OF BLADDER TUMOR  07/01/2011   Procedure: TRANSURETHRAL RESECTION OF BLADDER TUMOR (TURBT);  Surgeon: Hanley Ben, MD;  Location: [facility];  Service: Urology;  Laterality: N/A;  CYSTO  . TRANSURETHRAL RESECTION OF BLADDER TUMOR N/A 02/06/2015   Procedure:  TRANSURETHRAL RESECTION OF BLADDER TUMOR (TURBT) GREATER THAN 5 CM; RIGHT RETROGRADE, RIGHT STENT PLACEMENT;  Surgeon: Festus Aloe, MD;  Location: [facility];  Service: Urology;  Laterality: N/A;  . TRANSURETHRAL RESECTION OF BLADDER TUMOR N/A 07/31/2015   Procedure:  TRANSURETHRAL RESECTION OF BLADDER TUMOR (TURBT); REMOVAL OF DYSTROPHIC CALCIFICATIONS;  Surgeon: Festus Aloe, MD;  Location: [facility];  Service: Urology;  Laterality: N/A;  . TRANSURETHRAL RESECTION OF BLADDER TUMOR N/A 11/27/2015   Procedure: TRANSURETHRAL RESECTION OF BLADDER TUMOR (TURBT);  Surgeon: Festus Aloe, MD;  Location: [facility];  Service: Urology;  Laterality: N/A;  . TRANSURETHRAL RESECTION OF BLADDER  TUMOR WITH MITOMYCIN-C N/A 08/26/2016   Procedure: CYSTOSCOPY TRANSURETHRAL RESECTION OF BLADDER TUMOR WITH EPIRUBACIN;  Surgeon: Festus Aloe, MD;  Location: [facility];  Service: Urology;  Laterality: N/A;  . TUBAL LIGATION  AGE 48'S    reports that she quit smoking about 5 years ago. Her smoking use included Cigarettes. She has a 50.00 pack-year smoking history. She has never used smokeless tobacco. She reports that she drinks alcohol. She reports that she does not use drugs. family history includes Breast cancer in her maternal grandmother; COPD in her sister; Diabetes in her paternal grandmother; Heart attack in her brother, father, and sister; Heart disease in her father and mother; Hypertension in her father and mother; Stroke in her  sister; Thyroid disease in her sister. Allergies  Allergen Reactions  . Barbiturates Rash  . Penicillins Rash    TOLERATES CEPHALOSPORINS Has patient had a PCN reaction causing immediate rash, facial/tongue/throat swelling, SOB or lightheadedness with hypotension: Yes  Has patient had a PCN reaction causing severe rash involving mucus membranes or skin necrosis: Yes. Itchy Has patient had a PCN reaction that required hospitalization No Has patient had a PCN reaction occurring within the last 10 years: No If all of the above answers are "NO", then may proceed with Cephalosporin use.   . Shrimp [Shellfish Allergy] Swelling  . Anoro Ellipta [Umeclidinium-Vilanterol]     cough  . Aspirin Nausea Only  . Beta Adrenergic Blockers     PT STATES TOLD TO AVOID DUE TO COUGH REACTION TO CARDIZEM  . Bisoprolol     SOB and dizzy  . Calcium Channel Blockers     PER PT TOLD TO AVOID DUE TO COUGH REACTION TO CARDIZEM  . Celecoxib Nausea Only  . Ciprofloxacin Other (See Comments)    Red streaks on arm when given IV  . Codeine Nausea Only and Other (See Comments)    REACTION: excessive sleep  . Diltiazem Hcl Other (See Comments)    REACTION: cough  . Fluvastatin Sodium     Hair fell out, finger nails fell off   . Ibandronate Sodium Other (See Comments)    REACTION: HAIR LOSS/NAIL CHANGES  . Oxycodone-Acetaminophen Nausea Only  . Rosuvastatin     Hair fell out, finger nails fell off   . Simvastatin     Hair fell out, finger nails fell off   . Tramadol Nausea And Vomiting   Current Outpatient Prescriptions on File Prior to Visit  Medication Sig Dispense Refill  . acetaminophen (TYLENOL) 500 MG tablet Take 1,000 mg by mouth every 6 (six) hours as needed for moderate pain or headache.    . ALPRAZolam (XANAX) 0.25 MG tablet TAKE 1/2 TO 1 TABLET BY MOUTH TWICE A DAY AS NEEDED FOR ANXIETY (SHORTNESS OF BREATH) 60 tablet 2  . baclofen (LIORESAL) 10 MG tablet Take 1 tablet (10 mg total) by mouth 3  (three) times daily as needed for muscle spasms. 90 each 3  . BIOTIN PO Take 1 capsule by mouth at bedtime.     . Cholecalciferol (VITAMIN D) 1000 UNITS capsule Take 1,000 Units by mouth at bedtime.     . famotidine (PEPCID) 20 MG tablet Take 20 mg by mouth 2 (two) times daily.    Marland Kitchen ibuprofen (ADVIL,MOTRIN) 200 MG tablet Take 600-800 mg by mouth every 6 (six) hours as needed for moderate pain.    Marland Kitchen lidocaine (LIDODERM) 5 % Place 1 patch onto the skin daily as needed (NECK PAIN). Remove & Discard patch  within 12 hours or as directed by MD    . predniSONE (DELTASONE) 10 MG tablet Take  4 each am x 2 days,   2 each am x 2 days,  1 each am x 2 days and stop 14 tablet 0  . PROAIR HFA 108 (90 Base) MCG/ACT inhaler INHALE 1-2 PUFFS INTO THE LUNGS EVERY 4 (FOUR) HOURS AS NEEDED FOR WHEEZING OR SHORTNESS OF BREATH. 8.5 Inhaler 2  . SYMBICORT 160-4.5 MCG/ACT inhaler TAKE 2 PUFFS FIRST THING IN AM AND THEN ANOTHER 2 PUFFS ABOUT 12 HOURS LATER. 10.2 Inhaler 11  . traZODone (DESYREL) 50 MG tablet Take 1 tablet (50 mg total) by mouth daily. 90 tablet 0   No current facility-administered medications on file prior to visit.     Review of Systems  Constitutional: Negative for other unusual diaphoresis or sweats HENT: Negative for ear discharge or swelling Eyes: Negative for other worsening visual disturbances Respiratory: Negative for stridor or other swelling  Gastrointestinal: Negative for worsening distension or other blood Genitourinary: Negative for retention or other urinary change Musculoskeletal: Negative for other MSK pain or swelling Skin: Negative for color change or other new lesions Neurological: Negative for worsening tremors and other numbness  Psychiatric/Behavioral: Negative for worsening agitation or other fatigue All other system neg per pt    Objective:   Physical Exam BP 110/70   Pulse 85   Ht 5\' 3"  (1.6 m)   Wt 87 lb (39.5 kg)   SpO2 98%   BMI 15.41 kg/m   Not orthostatic ;  BP lying 120/70, standing 134/78 VS noted, thin, frail  Constitutional: Pt appears in NAD HENT: Head: NCAT.  Right Ear: External ear normal.  Left Ear: External ear normal.  Eyes: . Pupils are equal, round, and reactive to light. Conjunctivae and EOM are normal Nose: without d/c or deformity Neck: Neck supple. Gross normal ROM Cardiovascular: Normal rate and regular rhythm.   Pulmonary/Chest: Effort normal and breath sounds without rales or wheezing.  Abd:  Soft, NT, ND, + BS, no organomegaly, no flank tender Neurological: Pt is alert. At baseline orientation, motor 5/5 intact, cn 2-12 intact, gait intact Skin: Skin is warm. No rashes, other new lesions, no LE edema Psychiatric: Pt behavior is normal without agitation  No other exam findings Lab Results  Component Value Date   WBC 13.1 (H) 08/22/2016   HGB 11.9 (L) 08/22/2016   HCT 36.8 08/22/2016   PLT 346 08/22/2016   GLUCOSE 97 10/01/2016   CHOL 224 (H) 10/01/2016   TRIG 214.0 (H) 10/01/2016   HDL 61.40 10/01/2016   LDLDIRECT 132.0 10/01/2016   LDLCALC 120 (H) 07/06/2013   ALT 14 10/01/2016   AST 20 10/01/2016   NA 138 10/01/2016   K 3.6 10/01/2016   CL 105 10/01/2016   CREATININE 0.88 10/01/2016   BUN 24 (H) 10/01/2016   CO2 24 10/01/2016   TSH 1.37 10/01/2016   INR 1.0 09/04/2011   HGBA1C 5.8 (H) 06/04/2012       Assessment & Plan:

## 2016-11-27 NOTE — Telephone Encounter (Signed)
°  Patient Name: Amber Snow DOB: 05-16-1932 Initial Comment Caller states, for a few days having left foot swelling. Shortness of breath still. Verfied Nurse Assessment Nurse: Ronnald Ramp, RN, Miranda Date/Time (Eastern Time): 11/27/2016 2:20:52 PM Confirm and document reason for call. If symptomatic, describe symptoms. ---Caller states she has been having trouble dizziness and balance for 1 week. She has been seen by her Pulmonologist last week for SOB and prescribed Prednisone. Also with swelling her left foot. Does the patient have any new or worsening symptoms? ---Yes Will a triage be completed? ---Yes Related visit to physician within the last 2 weeks? ---No Does the PT have any chronic conditions? (i.e. diabetes, asthma, etc.) ---Yes List chronic conditions. ---COPD, CHF, HTN, Is this a behavioral health or substance abuse call? ---No Guidelines Guideline Title Affirmed Question Affirmed Notes Dizziness - Vertigo [1] MODERATE dizziness (e.g., vertigo; feels very unsteady, interferes with normal activities) AND [2] has NOT been evaluated by physician for this Ankle Swelling [1] Thigh, calf, or ankle swelling AND [2] only 1 side Final Disposition User See Physician within 4 Hours (or PCP triage) Ronnald Ramp, RN, Miranda Comments No appt available ith PCP. Appt scheduled for today at 5:30 with Dr. Jenny Reichmann Referrals REFERRED TO PCP OFFICE Disagree/Comply: Comply Disagree/Comply: Comply

## 2016-11-27 NOTE — Assessment & Plan Note (Signed)
stable overall by history and exam, and pt to continue medical treatment as before,  to f/u any worsening symptoms or concerns 

## 2016-11-27 NOTE — Assessment & Plan Note (Signed)
Mild but persistent, not orthostatic, cant r/o vertigo or overcontrolled BP; for trial meclizine prn, and to reduce losartan HCT to 1/2 per day, cont to monitor

## 2016-11-27 NOTE — Patient Instructions (Signed)
OK to take HALF of your losartan-HCT  Please take all new medication as prescribed - the meclizine as needed  Please continue all other medications as before, and refills have been done if requested.  Please have the pharmacy call with any other refills you may need.  Please keep your appointments with your specialists as you may have planned  Please see Dr Alain Marion in 1-2 wks

## 2016-11-27 NOTE — Assessment & Plan Note (Signed)
For HALF losartan HCT as above, cont to follow

## 2016-12-01 ENCOUNTER — Ambulatory Visit: Payer: Medicare Other | Admitting: Internal Medicine

## 2016-12-01 ENCOUNTER — Telehealth: Payer: Self-pay | Admitting: Internal Medicine

## 2016-12-01 ENCOUNTER — Encounter: Payer: Self-pay | Admitting: Internal Medicine

## 2016-12-01 ENCOUNTER — Other Ambulatory Visit (INDEPENDENT_AMBULATORY_CARE_PROVIDER_SITE_OTHER): Payer: Medicare Other

## 2016-12-01 ENCOUNTER — Ambulatory Visit (INDEPENDENT_AMBULATORY_CARE_PROVIDER_SITE_OTHER)
Admission: RE | Admit: 2016-12-01 | Discharge: 2016-12-01 | Disposition: A | Discharge status: Home / Self Care | Payer: Medicare Other | Source: Ambulatory Visit | Attending: Internal Medicine | Admitting: Internal Medicine

## 2016-12-01 ENCOUNTER — Ambulatory Visit (INDEPENDENT_AMBULATORY_CARE_PROVIDER_SITE_OTHER): Payer: Medicare Other | Admitting: Internal Medicine

## 2016-12-01 VITALS — BP 122/62 | HR 87 | Ht 63.0 in | Wt 86.0 lb

## 2016-12-01 DIAGNOSIS — J449 Chronic obstructive pulmonary disease, unspecified: Secondary | ICD-10-CM | POA: Diagnosis not present

## 2016-12-01 DIAGNOSIS — I10 Essential (primary) hypertension: Secondary | ICD-10-CM

## 2016-12-01 DIAGNOSIS — R05 Cough: Secondary | ICD-10-CM | POA: Diagnosis not present

## 2016-12-01 LAB — BASIC METABOLIC PANEL
BUN: 44 mg/dL — ABNORMAL HIGH (ref 6–23)
CHLORIDE: 103 meq/L (ref 96–112)
CO2: 25 meq/L (ref 19–32)
CREATININE: 1.04 mg/dL (ref 0.40–1.20)
Calcium: 9.5 mg/dL (ref 8.4–10.5)
GFR: 53.94 mL/min — ABNORMAL LOW (ref >60.00)
Glucose, Bld: 92 mg/dL (ref 70–99)
Potassium: 3.9 mEq/L (ref 3.5–5.1)
SODIUM: 136 meq/L (ref 135–145)

## 2016-12-01 LAB — CBC WITH DIFFERENTIAL/PLATELET
BASOS ABS: 0.1 10*3/uL (ref 0.0–0.1)
BASOS PCT: 0.5 % (ref 0.0–3.0)
EOS ABS: 0.1 10*3/uL (ref 0.0–0.7)
Eosinophils Relative: 0.7 % (ref 0.0–5.0)
HEMATOCRIT: 38.3 % (ref 36.0–46.0)
HEMOGLOBIN: 13 g/dL (ref 12.0–15.0)
LYMPHS PCT: 12.4 % (ref 12.0–46.0)
Lymphs Abs: 1.4 10*3/uL (ref 0.7–4.0)
MCHC: 33.8 g/dL (ref 30.0–36.0)
MCV: 94.9 fl (ref 78.0–100.0)
MONOS PCT: 8.4 % (ref 3.0–12.0)
Monocytes Absolute: 1 10*3/uL (ref 0.1–1.0)
NEUTROS ABS: 8.9 10*3/uL — AB (ref 1.4–7.7)
Neutrophils Relative %: 78 % — ABNORMAL HIGH (ref 43.0–77.0)
Platelets: 340 10*3/uL (ref 150.0–400.0)
RBC: 4.04 Mil/uL (ref 3.87–5.11)
RDW: 14.6 % (ref 11.5–15.5)
WBC: 11.4 10*3/uL — AB (ref 4.0–10.5)

## 2016-12-01 MED ORDER — RABEPRAZOLE SODIUM 20 MG PO TBEC: DELAYED_RELEASE_TABLET | ORAL | 2 refills | Status: AC

## 2016-12-01 MED ORDER — FLUTTER DEVI: 1.0000 | Freq: Every morning | 0 refills | Status: DC

## 2016-12-01 MED ORDER — FLUTTER DEVI: 1.0000 | 0 refills | Status: AC | PRN

## 2016-12-01 NOTE — Patient Instructions (Addendum)
Call with the name of the antibiotics from your pharmacist that wasn't cephalosprin (ceftn) (cefuroxime)   For cough >  mucinex or mucinex dm up t 1200 mg every 12 hours and the flutter valve as much as you can  When cough or breathing flare add aciphex 20 mg Take 30-60 min before first meal of the day and take pepcid after supper and at bedtime   Please remember to go to the lab and x-ray department downstairs in the basement  for your tests - we will call you with the results when they are available.     Pulmonary follow up is as needed  Add:  Bun/creat ratio c/w pre-renal azotemia > gatorade/ f/u with pcp

## 2016-12-01 NOTE — Telephone Encounter (Signed)
Pt aware that we will get names of abx's to MW.

## 2016-12-01 NOTE — Telephone Encounter (Signed)
Aware and included in ov 12/01/2016

## 2016-12-01 NOTE — Progress Notes (Signed)
Subjective:   Patient ID: Amber Snow, female    DOB: Dec 19, 1932  MRN: [id number]   Brief patient profile:  63  yowf with GOLD II copd by pfts 07/2009 quit smoking June 2013  due to progressive doe x 5 years worse since quit referred to pulmonary clinic 06/17/2012 by Dr Ignacia Palma    History of Present Illness  07/10/2015  f/u ov/Evangelos Paulino re: GOLD II/ could not afford bevespi and not convinced better than symbicort   Chief Complaint  Patient presents with  . Follow-up    Increased SOB x 2 wks. She has not felt well in general. She also c/o cough- mainly non prod. She is using proair once daily on average.   onset gradually worse x 2 weeks with any activity >  ? If better with albuterol ? Better with prednisone and feels worse off it in terms of energy but not cough/sob which is worse with use of arms. >>cont on Symbicort and Spiriva   08/09/2015 Follow up : [name]/COPD GOLD II  Pt returns for 1 month follow up and med review  Did not bring her meds today. She was upset that she is not seeing Dr. Melvyn Novas  Today.  Offered to reschedule x 2 but pt declined.  Complains of that her mouth is very sore.  Says can not tolerate Spiriva . Dries her out way too much , causes her tongue to be too sore.  Is taking Symbicort Twice daily  , says she rinses well after use.  Complains of 1 week of increased cough, congestion with green mucus. More sob than usual. No wheezing , chest pain, hemoptysis or fever.  Followed by Urology for bladder cancer . Had cystoscopy 07/31/15  Rec Doxycycline 100 mg twice daily for 1 week, take with food. Use sunscreen. If outside as this antibiotic may cause sunburn Mucinex DM twice daily as needed for cough and congestion. Mycelex lozenges 5 times daily for 1 week Continue on Symbicort 2 puffs twice daily, rinse after each use   11/08/2015  f/u ov/Alvera Tourigny re: GOLD II copd/ symbicort 160 2bid / no med calendar  Chief Complaint  Patient presents with  . Follow-up   Breathing is some worse- relates to humid weather. Cough is prod with white sputum. She has noticed increased wheezing. She is using albuterol once per wk on average.   sleeping ok/ note aecopd @ last ov and responded back to baseline  Ok walking indoors = MMRC2 = can't walk a nl pace on a flat grade s sob but does fine slow and flat eg [employer] but not mall  C/o swelling in legs/ dependent daytime despite cozaar as her bp med and using lasix 20 mg prn  rec Discuss with Dr Alain Marion add hctz to your cozar  No change in pulmonary medications     01/22/2016  f/u ov/Kaya Pottenger re: GOLD II  copd/ symbicort 160 2bid  Chief Complaint  Patient presents with  . Follow-up    c/o sob worse 3-4 days ago,was using Albuterol 1x day, little better now,cough-thick white,chest tightness,denies cp,  no change ex tol Never took more than one proair daily and improved since flare of cough  Doe still  = mmrc 2  rec Plan A = Automatic = Symbicort 160 Take 2 puffs first thing in am and then another 2 puffs about 12 hours later.  Plan B = Backup Only use your albuterol as a rescue medication  - ok to use up to 2  pffs q4h prn      11/17/2016  f/u ov/Alvita Fana re:  Copd  GOLD II/ symb 160 2bid Chief Complaint  Patient presents with  . Follow-up    Breathing has been worse recently.  She also has had some wheezing and cough with clear sputum.  She is in the process of moving and feels that this is making her symptoms worse. She is using her albuterol inhaler once daily on average.   needing albuterol in afternoon  x sev weeks while packing / cleaning house in anticipation of moving to [facility]  Never needs saba noct  Doe = MMRC2 = can't walk a nl pace   rec Prednisone 10 mg take  4 each am x 2 days,   2 each am x 2 days,  1 each am x 2 days and stop  You will need to establish with a board certified pulmonary doctor in [place] - call for records when you establish - call us in meantime if needed    12/01/2016 acute  extended ov/Heba Ige re:  aecopd  GOLD II / symbicort 160  Chief Complaint  Patient presents with  . Acute Visit    Pt states her breathing is not improving at all since her last ov 11/17/16. She is still needing her Proair inhaler daily.  Her cough is prod off and on with clear sputum.  She states she has noticed some dizziness over the past wk.  only takes proair once a day typically in the pm and seems to help Since breathing worse early July 2018 also rattling congested cough productive of clear mucus s/p multple abx for recurrent uti all cephalosporins per pharmacy review.  No obvious day to day or daytime variability or assoc purulent sputum or mucus plugs or hemoptysis or cp or chest tightness, subjective wheeze or overt sinus or hb symptoms. No unusual exp hx or h/o childhood pna/ asthma or knowledge of premature birth.  Sleeping ok without nocturnal  or early am exacerbation  of respiratory  c/o's or need for noct saba. Also denies any obvious fluctuation of symptoms with weather or environmental changes or other aggravating or alleviating factors except as outlined above   Current Medications, Allergies, Complete Past Medical History, Past Surgical History, Family History, and Social History were reviewed in [employer] record.  ROS  The following are not active complaints unless bolded sore throat, dysphagia, dental problems, itching, sneezing,  nasal congestion or excess/ purulent secretions, ear ache,   fever, chills, sweats, unintended wt loss, classically pleuritic or exertional cp,  orthopnea pnd or leg swelling, presyncope, palpitations, abdominal pain, anorexia, nausea, vomiting, diarrhea  or change in bowel or bladder habits, change in stools or urine, dysuria,hematuria,  rash, arthralgias, visual complaints, headache, numbness, weakness or ataxia only right p stands up, already seen by pcp with rec to take one half hyzar dose and no better on antivert  or problems  with walking or coordination,  change in mood/affect or memory.                       Objective:   Physical Exam  Thin  amb anxious  wf nad   07/29/2012  105  >102 09/24/2012 > 10/26/2012  99 > 12/10/2012 99 > 104 03/14/2013 > 06/20/2013 107 > 109 07/28/2013 > 11/09/2013  109 > 11/10/2014   113 > 03/13/2015  113 > 04/10/2015   113 > 07/10/2015  114 > 11/08/2015   103 >  01/22/2016 96 > 05/19/2016  89 > 11/17/2016    90 > 12/01/2016   86      Vital signs reviewed   -  - Note on arrival 02 sats  97% on RA       HEENT: nl dentition, turbinates bilaterally, and oropharynx. Nl external ear canals without cough reflex   NECK :  without JVD/Nodes/TM/ nl carotid upstrokes bilaterally   LUNGS: no acc muscle use,  Mildly kyphotic chest wall,   insp and exp rhonchi bilaterally / no true wheeze/ large upper airway component    CV:  RRR  no s3 or murmur or increase in P2, and no edema   ABD:  soft and nontender with nl inspiratory excursion in the supine position. No bruits or organomegaly appreciated, bowel sounds nl  MS:  Nl gait/ ext warm without deformities, calf tenderness, cyanosis or clubbing No obvious joint restrictions   SKIN: warm and dry without lesions    NEURO:  alert, approp, nl sensorium with  no motor or cerebellar deficits apparent.        CXR PA and Lateral:   12/01/2016 :    I personally reviewed images and agree with radiology impression as follows:    No acute cardiopulmonary disease.  Hyperinflation, consistent with COPD.   Labs ordered/ reviewed:      Chemistry      Component Value Date/Time   NA 136 12/01/2016 1455   NA 140 01/11/2016 1002   K 3.9 12/01/2016 1455   K 3.9 01/11/2016 1002   CL 103 12/01/2016 1455   CO2 25 12/01/2016 1455   CO2 26 01/11/2016 1002   BUN 44 (H) 12/01/2016 1455   BUN 16.5 01/11/2016 1002   CREATININE 1.04 12/01/2016 1455   CREATININE 0.9 01/11/2016 1002      Component Value Date/Time   CALCIUM 9.5 12/01/2016 1455   CALCIUM  9.9 01/11/2016 1002   ALKPHOS 68 10/01/2016 0928   ALKPHOS 76 01/11/2016 1002   AST 20 10/01/2016 0928   AST 22 01/11/2016 1002   ALT 14 10/01/2016 0928   ALT 15 01/11/2016 1002   BILITOT 0.4 10/01/2016 0928   BILITOT 0.54 01/11/2016 1002        Lab Results  Component Value Date   WBC 11.4 (H) 12/01/2016   HGB 13.0 12/01/2016   HCT 38.3 12/01/2016   MCV 94.9 12/01/2016   PLT 340.0 12/01/2016          Assessment & Plan:

## 2016-12-02 ENCOUNTER — Telehealth: Payer: Self-pay | Admitting: Internal Medicine

## 2016-12-02 ENCOUNTER — Encounter: Payer: Self-pay | Admitting: Internal Medicine

## 2016-12-02 ENCOUNTER — Ambulatory Visit (INDEPENDENT_AMBULATORY_CARE_PROVIDER_SITE_OTHER): Payer: Medicare Other | Admitting: Internal Medicine

## 2016-12-02 VITALS — BP 116/64 | HR 79 | Temp 97.9°F | Ht 63.0 in | Wt 86.0 lb

## 2016-12-02 DIAGNOSIS — I2510 Atherosclerotic heart disease of native coronary artery without angina pectoris: Secondary | ICD-10-CM

## 2016-12-02 DIAGNOSIS — I10 Essential (primary) hypertension: Secondary | ICD-10-CM | POA: Diagnosis not present

## 2016-12-02 DIAGNOSIS — I428 Other cardiomyopathies: Secondary | ICD-10-CM

## 2016-12-02 DIAGNOSIS — R531 Weakness: Secondary | ICD-10-CM

## 2016-12-02 DIAGNOSIS — R0600 Dyspnea, unspecified: Secondary | ICD-10-CM | POA: Diagnosis not present

## 2016-12-02 DIAGNOSIS — R42 Dizziness and giddiness: Secondary | ICD-10-CM | POA: Diagnosis not present

## 2016-12-02 NOTE — Assessment & Plan Note (Signed)
Orthostatic BP drop and sx's Hold Losartan-HCT

## 2016-12-02 NOTE — Progress Notes (Signed)
LMTCB

## 2016-12-02 NOTE — Progress Notes (Signed)
Spoke with pt and notified of results per Dr. Wert. Pt verbalized understanding and denied any questions. 

## 2016-12-02 NOTE — Patient Instructions (Signed)
Hold Losartan-HCT. Re-start it if leg swelling, SOB etc  See your new MD in 3-6 weeks  Good luck with your move!

## 2016-12-02 NOTE — Assessment & Plan Note (Signed)
She continues to have orthostatic symptoms and has significant pre-renal azotemia now  Will encourage gatorade to restore volume and defer to Dr Jenny Reichmann whether to continue to use a arb with diuretic in this setting as seems to be very sensitive to even very low doses.

## 2016-12-02 NOTE — Assessment & Plan Note (Signed)
Orthostatic BP drop and sx's due to wt loss Hold Losartan-HCT. Re-start if leg swelling, SOB etc See MD in 3-6 weeks

## 2016-12-02 NOTE — Assessment & Plan Note (Signed)
Hold Losartan-HCT. Re-start if leg swelling, SOB etc See MD in 3-6 weeks

## 2016-12-02 NOTE — Telephone Encounter (Signed)
Tanda Rockers, MD sent to Rosana Berger, [place]        Call patient : Amber Snow is c/w mild dehyrdration as cause for dizziness - drink a little gatorade to replenish fluids and if this fixes the problem may need further adjustments in the hbp meds per pcp    Tanda Rockers, MD sent to Rosana Berger, [place]        Call pt: Reviewed cxr and no acute change so no change in recommendations made at [facility] with pt and notified of results per Dr. Melvyn Novas. Pt verbalized understanding and denied any questions.

## 2016-12-02 NOTE — Assessment & Plan Note (Addendum)
-   PFT's 07/31/09 FEV1  1.36 (74%) ratio 45 and no better p B2,  DLCO 66%  - quit smoking June 2013 - PFT's 07/29/2012 FEV1 1.10 (63%) ratio 46 and no change p B2 and DLCO 57%  - 10/26/2012  Walked RA x 3 laps @ 185 ft each stopped due to end of study, no desats - Trial off spiriva 03/14/2013 > rechallenged 06/20/12 due to worse doe > d/cd around 06/26/13 and no worse off it  - 03/13/2015 trained on respimat > 90% effecitve > changed to stiolto 2 pffs each am > preferred symbicort so 04/10/2015 rec bevespi trial  - 04/10/2015  Walked RA x 3 laps @ 185 ft each stopped due to  End of study, nl pace, no sob or desat/ did c/o leg fatigue bilaterally  - 07/10/2015    try spiriva respimat 2 each am > unable to tolerate mouth effects so d/c'd 08/2015  - 05/19/2016  After extensive coaching HFA effectiveness =    90%   - added flutter valve 12/01/2016 / trained on use   Main issues presently relate to poor cough mechanics and note no better p prednisone raising issues of possible underlying bronchiectasis or gerd related cough so rec   No more pred Continue symb 160 2bid but increase the saba to up to 2 q4h prn as previously rec Add gerd rx when coughing  Add flutter valve to help with cough mechanics Consider neb next step, perhaps with laba/ics as well as saba prn    I had an extended discussion with the patient reviewing all relevant studies completed to date and  lasting 15 to 20 minutes of a 25 minute acute visit    Each maintenance medication was reviewed in detail including most importantly the difference between maintenance and prns and under what circumstances the prns are to be triggered using an action plan format that is not reflected in the computer generated alphabetically organized AVS.    Please see AVS for specific instructions unique to this visit that I personally wrote and verbalized to the the pt in detail and then reviewed with pt  by my nurse highlighting any  changes in therapy recommended  at today's visit to their plan of care.

## 2016-12-02 NOTE — Progress Notes (Signed)
Subjective:  Patient ID: Amber Snow, female    DOB: 02/16/1933  Age: 83 y.o. MRN: [id number]  CC: No chief complaint on file.   HPI Amber Snow presents for anxiety, COPD F/u dizziness f/u - Dr Gwynn Burly note reviewed - better after BP Rx was reduced. Dr Gustavus Bryant note, labs, CXR - reviewed Moving to [place], [place] this weekend  Outpatient Medications Prior to Visit  Medication Sig Dispense Refill  . acetaminophen (TYLENOL) 500 MG tablet Take 1,000 mg by mouth every 6 (six) hours as needed for moderate pain or headache.    . ALPRAZolam (XANAX) 0.25 MG tablet TAKE 1/2 TO 1 TABLET BY MOUTH TWICE A DAY AS NEEDED FOR ANXIETY (SHORTNESS OF BREATH) 60 tablet 2  . baclofen (LIORESAL) 10 MG tablet Take 1 tablet (10 mg total) by mouth 3 (three) times daily as needed for muscle spasms. 90 each 3  . BIOTIN PO Take 1 capsule by mouth at bedtime.     . Cholecalciferol (VITAMIN D) 1000 UNITS capsule Take 1,000 Units by mouth at bedtime.     . famotidine (PEPCID) 20 MG tablet Take 20 mg by mouth 2 (two) times daily.    Marland Kitchen ibuprofen (ADVIL,MOTRIN) 200 MG tablet Take 600-800 mg by mouth every 6 (six) hours as needed for moderate pain.    Marland Kitchen lidocaine (LIDODERM) 5 % Place 1 patch onto the skin daily as needed (NECK PAIN). Remove & Discard patch within 12 hours or as directed by MD    . losartan-hydrochlorothiazide (HYZAAR) 50-12.5 MG tablet 1/2 tab by mouth once daily 45 tablet 3  . meclizine (ANTIVERT) 12.5 MG tablet Take 1 tablet (12.5 mg total) by mouth 3 (three) times daily as needed for dizziness. 30 tablet 1  . PROAIR HFA 108 (90 Base) MCG/ACT inhaler INHALE 1-2 PUFFS INTO THE LUNGS EVERY 4 (FOUR) HOURS AS NEEDED FOR WHEEZING OR SHORTNESS OF BREATH. 8.5 Inhaler 2  . RABEprazole (ACIPHEX) 20 MG tablet Take 30-60 min before first meal of the day 30 tablet 2  . Respiratory Therapy Supplies (FLUTTER) DEVI 1 Device by Does not apply route as needed. 1 each 0  . SYMBICORT 160-4.5 MCG/ACT inhaler TAKE 2  PUFFS FIRST THING IN AM AND THEN ANOTHER 2 PUFFS ABOUT 12 HOURS LATER. 10.2 Inhaler 11  . traZODone (DESYREL) 50 MG tablet Take 1 tablet (50 mg total) by mouth daily. 90 tablet 0   No facility-administered medications prior to visit.     ROS Review of Systems  Constitutional: Positive for fatigue and unexpected weight change. Negative for activity change, appetite change and chills.  HENT: Negative for congestion, mouth sores and sinus pressure.   Eyes: Negative for visual disturbance.  Respiratory: Positive for shortness of breath and wheezing. Negative for cough and chest tightness.   Gastrointestinal: Negative for abdominal pain and nausea.  Genitourinary: Negative for difficulty urinating, frequency and vaginal pain.  Musculoskeletal: Negative for back pain and gait problem.  Skin: Negative for pallor and rash.  Neurological: Positive for light-headedness. Negative for dizziness, tremors, weakness, numbness and headaches.  Psychiatric/Behavioral: Negative for confusion and sleep disturbance. The patient is nervous/anxious.     Objective:  BP 116/64 (BP Location: Left Arm, Patient Position: Sitting, Cuff Size: Normal)   Pulse 79   Temp 97.9 F (36.6 C) (Oral)   Ht 5\' 3"  (1.6 m)   Wt 86 lb (39 kg)   SpO2 98%   BMI 15.23 kg/m   BP Readings from Last 3 Encounters:  12/02/16 116/64  12/01/16 122/62  11/27/16 110/70    Wt Readings from Last 3 Encounters:  12/02/16 86 lb (39 kg)  12/01/16 86 lb (39 kg)  11/27/16 87 lb (39.5 kg)    Physical Exam  Constitutional: She appears well-developed. No distress.  HENT:  Head: Normocephalic.  Right Ear: External ear normal.  Left Ear: External ear normal.  Nose: Nose normal.  Mouth/Throat: Oropharynx is clear and moist.  Eyes: Pupils are equal, round, and reactive to light. Conjunctivae are normal. Right eye exhibits no discharge. Left eye exhibits no discharge.  Neck: Normal range of motion. Neck supple. No JVD present. No  tracheal deviation present. No thyromegaly present.  Cardiovascular: Normal rate, regular rhythm and normal heart sounds.   Pulmonary/Chest: No stridor. No respiratory distress. She has no wheezes.  Abdominal: Soft. Bowel sounds are normal. She exhibits no distension and no mass. There is no tenderness. There is no rebound and no guarding.  Musculoskeletal: She exhibits no edema or tenderness.  Lymphadenopathy:    She has no cervical adenopathy.  Neurological: She displays normal reflexes. No cranial nerve deficit. She exhibits normal muscle tone. Coordination normal.  Skin: No rash noted. No erythema.  Psychiatric: Her behavior is normal. Judgment and thought content normal.  thin, anxious  Procedure: EKG Indication: SOB Impression: SR. LBBB. No acute changes.   Lab Results  Component Value Date   WBC 11.4 (H) 12/01/2016   HGB 13.0 12/01/2016   HCT 38.3 12/01/2016   PLT 340.0 12/01/2016   GLUCOSE 92 12/01/2016   CHOL 224 (H) 10/01/2016   TRIG 214.0 (H) 10/01/2016   HDL 61.40 10/01/2016   LDLDIRECT 132.0 10/01/2016   LDLCALC 120 (H) 07/06/2013   ALT 14 10/01/2016   AST 20 10/01/2016   NA 136 12/01/2016   K 3.9 12/01/2016   CL 103 12/01/2016   CREATININE 1.04 12/01/2016   BUN 44 (H) 12/01/2016   CO2 25 12/01/2016   TSH 1.37 10/01/2016   INR 1.0 09/04/2011   HGBA1C 5.8 (H) 06/04/2012    Dg Chest 2 View  Result Date: 12/01/2016 CLINICAL DATA:  Dyspnea. Cough. Congestion for 10 days. History CHF. Pneumonia. COPD. Ex-smoker. EXAM: CHEST  2 VIEW COMPARISON:  None. FINDINGS: Pectus excavatum deformity. Hyperinflation. Midline trachea. Normal heart size. Atherosclerosis in the transverse aorta. No pleural effusion or pneumothorax. Biapical pleural-parenchymal opacities are similar and favored to represent scarring. IMPRESSION: No acute cardiopulmonary disease. Hyperinflation, consistent with COPD. Aortic Atherosclerosis (ICD10-I70.0). Electronically Signed   By: Abigail Miyamoto M.D.    On: 12/01/2016 16:10    Assessment & Plan:   There are no diagnoses linked to this encounter. I am having Ms. Hemmer maintain her Vitamin D, BIOTIN PO, ibuprofen, famotidine, lidocaine, ALPRAZolam, PROAIR HFA, acetaminophen, SYMBICORT, baclofen, traZODone, losartan-hydrochlorothiazide, meclizine, RABEprazole, and FLUTTER.  No orders of the defined types were placed in this encounter.    Follow-up: No Follow-up on file.  Walker Kehr, MD

## 2016-12-02 NOTE — Assessment & Plan Note (Signed)
EKG today - no change

## 2016-12-02 NOTE — Assessment & Plan Note (Signed)
Hold Losartan-HCT. Re-start if leg swelling, SOB etc See new MD in 3-6 weeks

## 2016-12-04 DIAGNOSIS — N3021 Other chronic cystitis with hematuria: Secondary | ICD-10-CM | POA: Diagnosis not present

## 2016-12-04 DIAGNOSIS — R8271 Bacteriuria: Secondary | ICD-10-CM | POA: Diagnosis not present

## 2016-12-04 DIAGNOSIS — C672 Malignant neoplasm of lateral wall of bladder: Secondary | ICD-10-CM | POA: Diagnosis not present

## 2016-12-16 ENCOUNTER — Encounter: Payer: Self-pay | Admitting: Gastroenterology

## 2017-02-10 ENCOUNTER — Telehealth: Payer: Self-pay | Admitting: Cardiology

## 2017-02-10 NOTE — Telephone Encounter (Signed)
New Message        [facility] Pre-operative Risk Assessment    Request for surgical clearance:  1. What type of surgery is being performed? csytlcopi trans urethral reception of bladder tumor , w bi lateral retrograde pyelogram   2. When is this surgery scheduled?  02/18/17   3. Are there any medications that need to be held prior to surgery and how long? no  4. Practice name and name of physician performing surgery?  Dr Marin Comment  5. What is your office phone and fax number?  [facility] [phone] fax [phone]   6. Anesthesia type (None, local, MAC, general) ?  General    Amber Snow 02/10/2017, 2:04 PM  _________________________________________________________________   (provider comments below)

## 2017-02-10 NOTE — Telephone Encounter (Signed)
    Chart reviewed as part of pre-operative protocol coverage. Patient was contacted 02/10/2017 in reference to pre-operative risk assessment for pending surgery as outlined below.  Amber Snow was last seen on 10/01/16 by Dr. Martinique.  Since that day, Amber Snow has done well. Revised Cardiac Index Risk is 0.9% thus based on ACC/AHA guidelines, the patient would be at acceptable risk for the planned procedure without further cardiovascular testing. I contacted patient at updated number now that she lives in [place] - [phone]. [facility] Activity Status Index calculator indicates the patient is able to perform 5.07 METS (lost several points for simply not participating in running or heavier sports exertion). She is able to complete these activities without angina, except does notice she gets mildly tired while making the bed - no chest pain though. She is able to walk, sweep, cook, and go up a flight of stairs without chest pain or dyspnea. She states her blood pressure medicine was stopped by internist in [place] due to lower blood pressures. She has not had any recent issues since this time. She has not yet established care with cardiology in [place]. She has a pre-op appointment with an internist next week.   Based on cardiac history and time since last visit, I will forward this message to Dr. Martinique to get his final thoughts on clearance. Dr. Martinique, to reply to this message, please hit Quicknote and route note back to P CV DIV PREOP (so the message stays in the pre-op box) Thanks.  Charlie Pitter, PA-C 02/10/2017, 2:35 PM

## 2017-02-10 NOTE — Telephone Encounter (Signed)
Agree she is acceptable risk for planned surgery.  Nygel Prokop Martinique MD, [facility]

## 2017-02-10 NOTE — Telephone Encounter (Signed)
Will forward this message via Epic fax function to Dr. Marin Comment office and close encounter. Jeromy Borcherding PA-C

## 2017-04-21 ENCOUNTER — Other Ambulatory Visit: Payer: Self-pay | Admitting: Internal Medicine

## 2017-05-01 ENCOUNTER — Telehealth (INDEPENDENT_AMBULATORY_CARE_PROVIDER_SITE_OTHER): Payer: Self-pay | Admitting: Physical Medicine and Rehabilitation

## 2017-05-01 NOTE — Telephone Encounter (Signed)
I mailed medical records release form to patient per her request

## 2017-05-24 ENCOUNTER — Other Ambulatory Visit: Payer: Self-pay | Admitting: Internal Medicine

## 2017-12-12 IMAGING — MR MR LUMBAR SPINE W/O CM
4 of 5 series · 25 of 48 positions shown · non-contrast
Comparison: Lumbar radiographs 10/18/2015

CLINICAL DATA: Chronic bilateral low back pain with sciatica

EXAM:
MRI LUMBAR SPINE WITHOUT CONTRAST
TECHNIQUE: Multiplanar, multisequence MR imaging of the lumbar spine was
performed. No intravenous contrast was administered.

[Series 4: T1 · sagittal · 4.0mm · 0.55mm/px · 6 of 13 slices shown (1 of 2)]
[im 1/13]
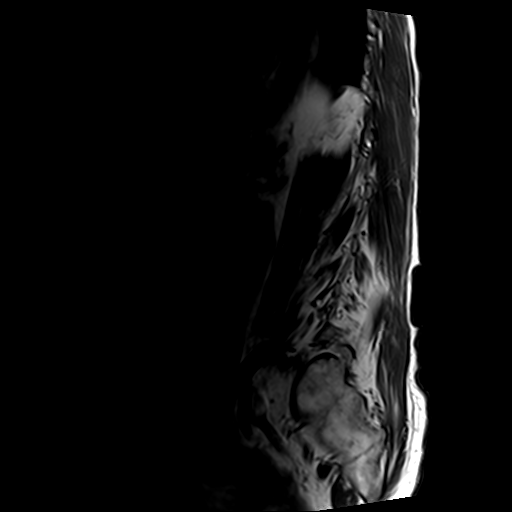
[im 3/13]
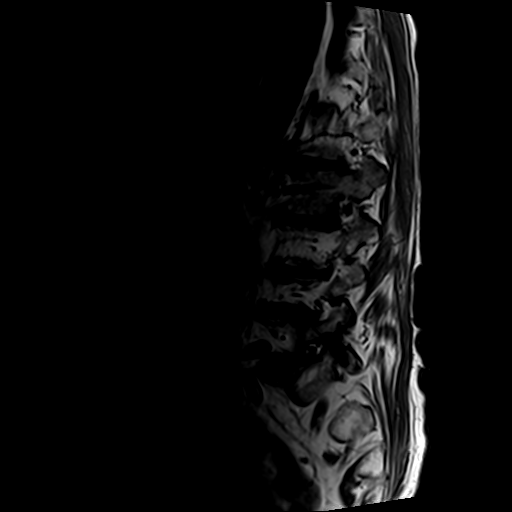
[im 5/13]
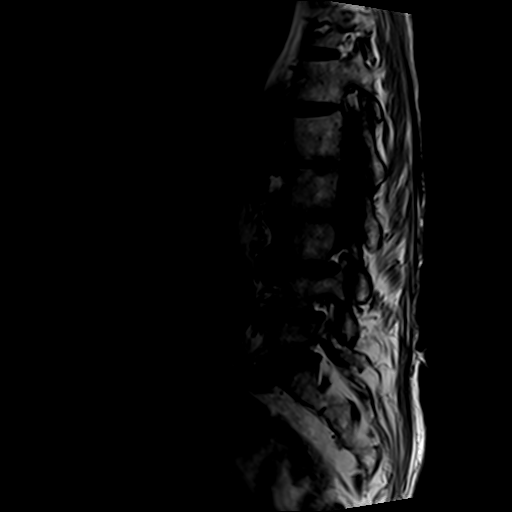
[im 8/13]
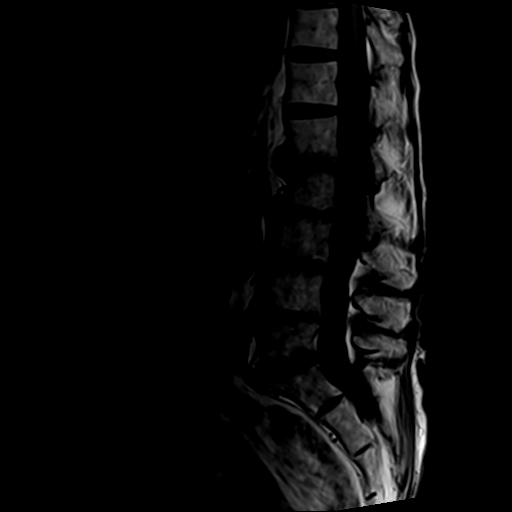
[im 10/13]
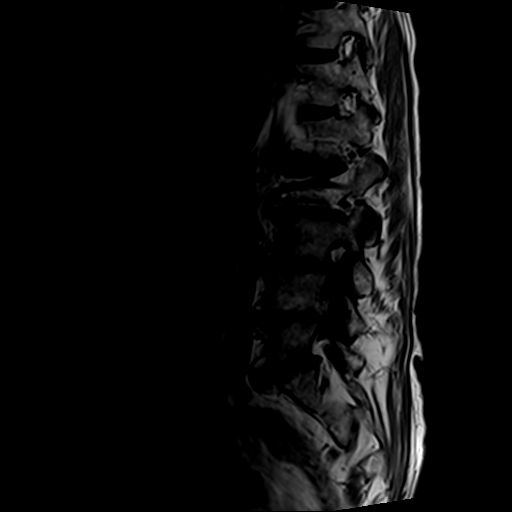
[im 13/13]
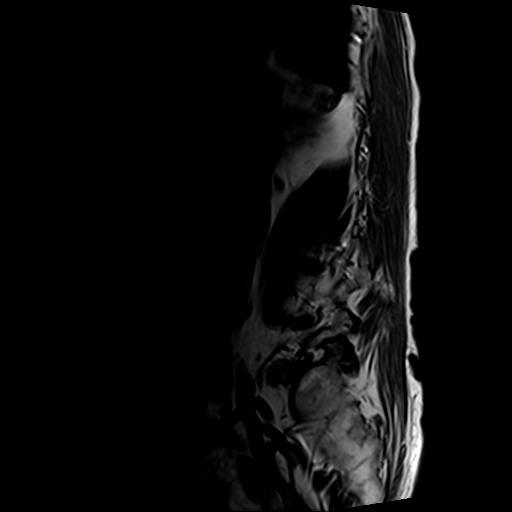

[Series 5: T2 post-contrast · sagittal · 4.0mm · 0.55mm/px · 6 of 13 slices shown]
[im 1/13]
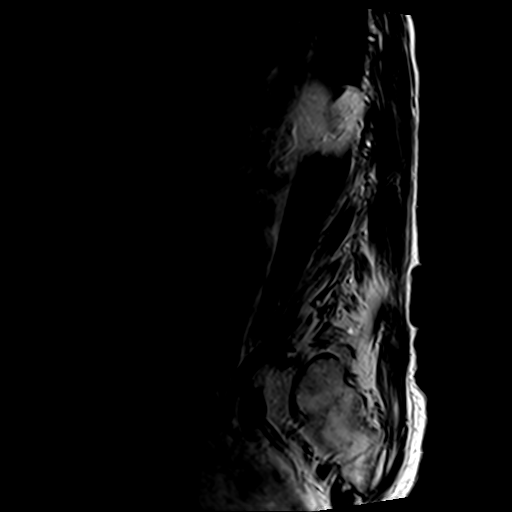
[im 3/13]
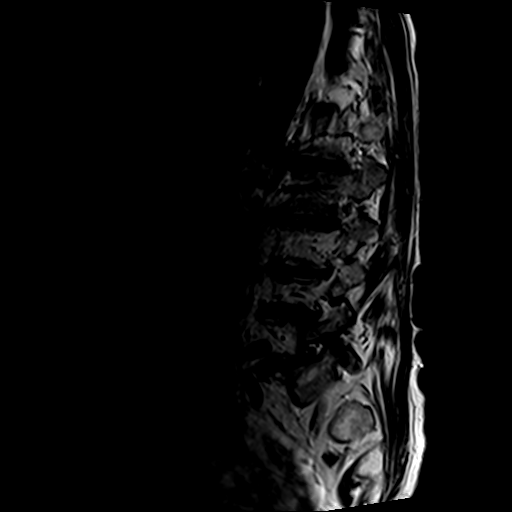
[im 5/13]
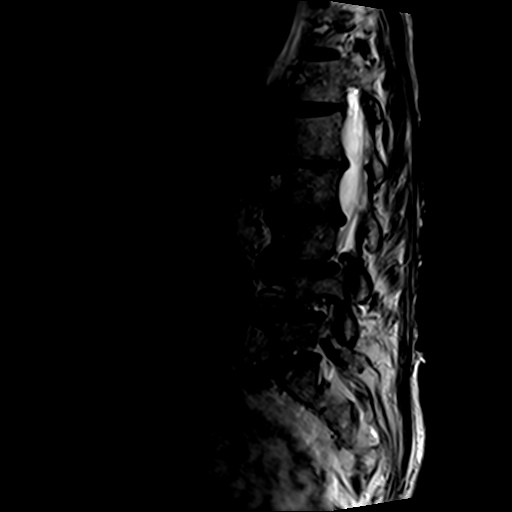
[im 8/13]
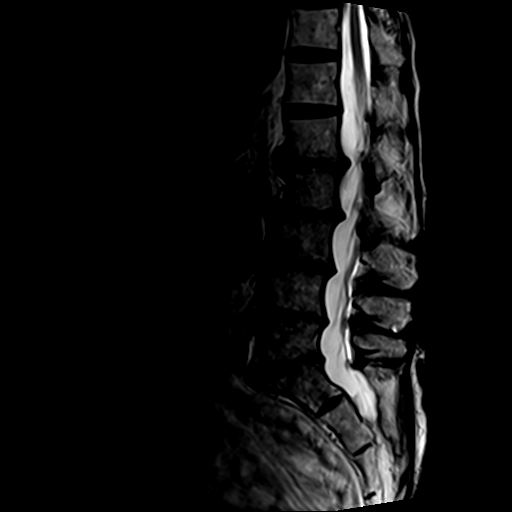
[im 10/13]
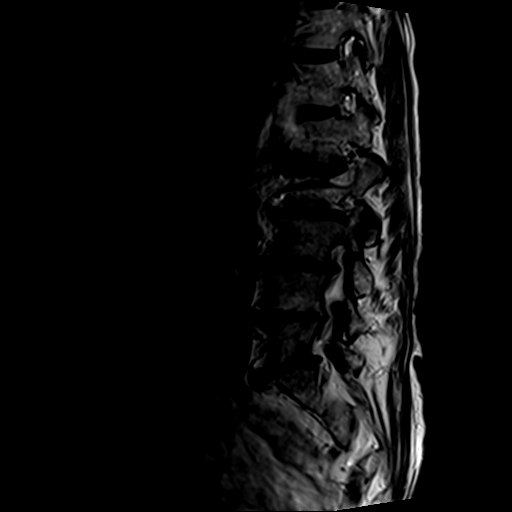
[im 13/13]
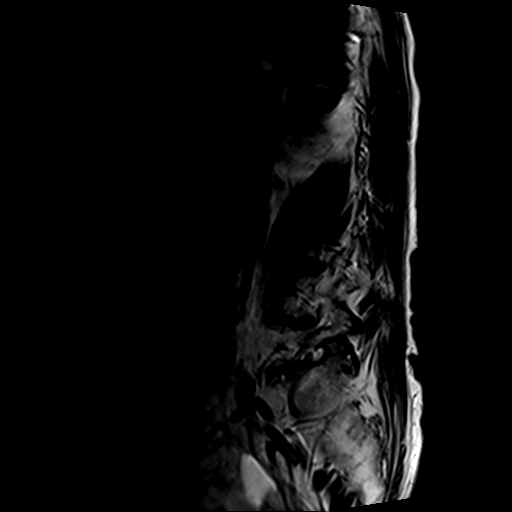

[Series 6: T2 · axial · 4.0mm · 0.70mm/px · z∈[-153,+11]mm · 9 of 31 slices shown]
[im 1/31]
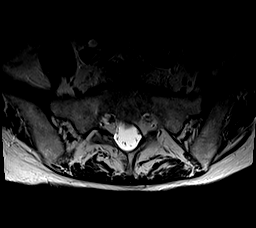
[im 5/31]
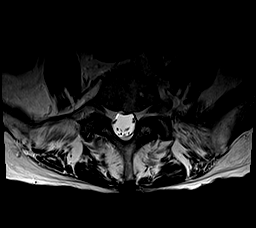
[im 9/31]
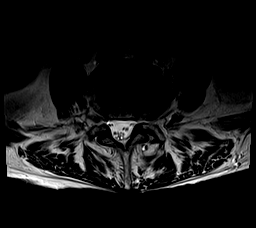
[im 13/31]
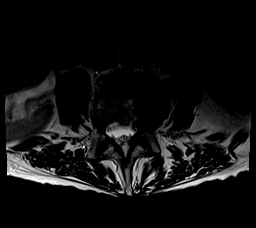
[im 16/31]
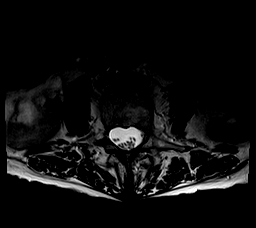
[im 18/31]
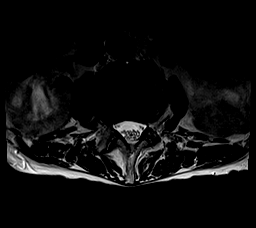
[im 22/31]
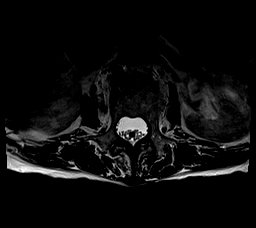
[im 26/31]
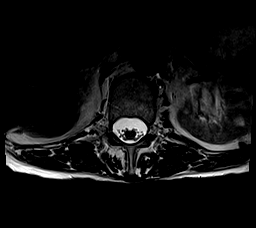
[im 31/31]
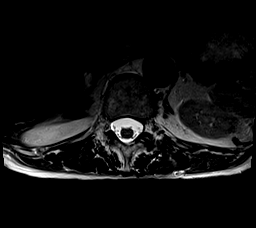

[Series 7: T1 · axial · 4.0mm · 0.35mm/px · z∈[-153,-13]mm · 4 of 31 slices shown (2 of 2)]
[im 1/31]
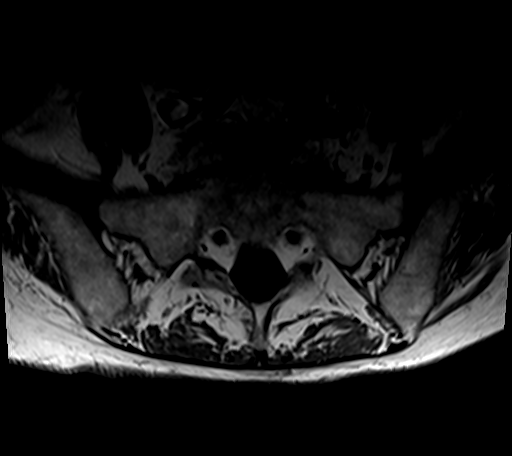
[im 5/31]
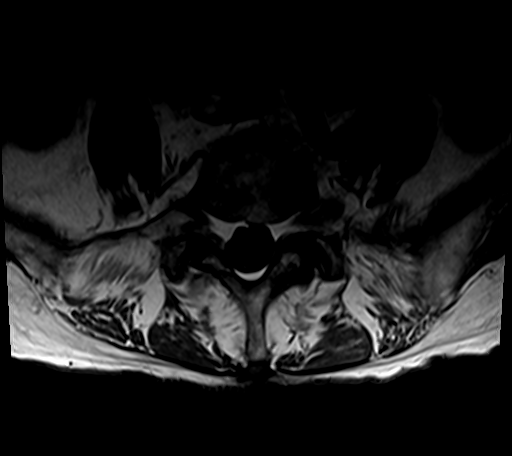
[im 16/31]
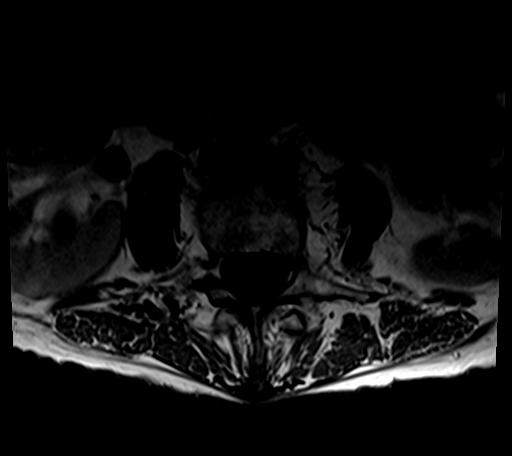
[im 26/31]
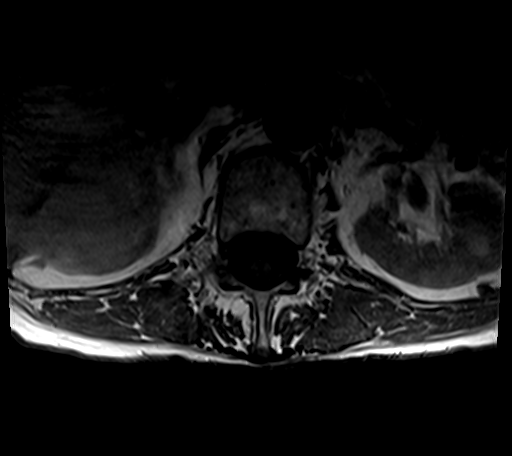

[25 of 48 positions shown; findings below may reference images not displayed]

FINDINGS: Segmentation:  Normal

Alignment: Normal alignment. Reversal of lumbar lordosis with mild
kyphosis.

Vertebrae:  Negative for fracture or mass.  Normal bone marrow.

Conus medullaris: Extends to the L2-3 a level and appears normal.

Paraspinal and other soft tissues: Negative

Disc levels:

T12-L1:  Negative

L1-2: Moderate to advanced disc degeneration with disc space
narrowing and spurring, right greater than left. No significant
stenosis of the canal or neural foramina.

L2-3: Advanced disc degeneration with disc space narrowing and
diffuse endplate spurring. No significant stenosis.

L3-4: Advanced disc degeneration with diffuse endplate spurring.
Left foraminal and extraforaminal osteophyte displacing the left L3
nerve root lateral to the foramen. Mild left foraminal encroachment.
Subarticular stenosis on the left. Spinal canal adequate in size.

L4-5: Advanced disc degeneration with disc space narrowing and
diffuse endplate osteophyte formation. Marked ligamentum flavum
hypertrophy on the left contributing to subarticular stenosis on the
left. Mild foraminal narrowing bilaterally. Mild narrowing of the
spinal canal

L5-S1: Advanced disc degeneration with diffuse endplate spurring.
Mild subarticular and foraminal stenosis bilaterally.
IMPRESSION: Advanced multilevel degenerative change throughout the lumbar spine
with disc space narrowing and diffuse spurring. Reversal of the
lumbar lordosis.

Left foraminal and extraforaminal osteophyte at L3-4 displacing the
left L3 nerve root. Subarticular stenosis on the left

Left subarticular stenosis L4-5 due to spurring and ligament flavum
hypertrophy. Mild foraminal narrowing bilaterally

Mild subarticular and foraminal stenosis bilaterally L5-S1 due to
spurring.

## 2018-05-06 LAB — DERMATOLOGY PATHOLOGY

## 2018-05-24 ENCOUNTER — Other Ambulatory Visit: Payer: Self-pay | Admitting: Nurse Practitioner

## 2018-09-17 ENCOUNTER — Emergency Department: Admit: 2018-09-17 | Payer: MEDICARE | Primary: Family Medicine

## 2018-09-17 ENCOUNTER — Inpatient Hospital Stay
Admission: EM | Admit: 2018-09-17 | Discharge: 2018-09-25 | Disposition: A | Discharge status: Skilled Nursing Facility | Payer: MEDICARE | Source: Other Acute Inpatient Hospital | Admitting: Family Medicine

## 2018-09-17 ENCOUNTER — Inpatient Hospital Stay: Admit: 2018-09-17 | Payer: MEDICARE | Primary: Family Medicine

## 2018-09-17 DIAGNOSIS — I5043 Acute on chronic combined systolic (congestive) and diastolic (congestive) heart failure: Principal | ICD-10-CM

## 2018-09-17 LAB — TROPONIN
Troponin, High Sensitivity: 40 ng/L — ABNORMAL HIGH (ref 0–14)
Troponin, High Sensitivity: 40 ng/L — ABNORMAL HIGH (ref 0–14)
Troponin, High Sensitivity: 43 ng/L — ABNORMAL HIGH (ref 0–14)

## 2018-09-17 LAB — CBC WITH AUTO DIFFERENTIAL
Absolute Eos #: 0.11 10*3/uL (ref 0.00–0.44)
Absolute Immature Granulocyte: 0.06 10*3/uL (ref 0.00–0.30)
Absolute Lymph #: 1.41 10*3/uL (ref 1.10–3.70)
Absolute Mono #: 0.9 10*3/uL (ref 0.10–1.20)
Basophils Absolute: 0.06 10*3/uL (ref 0.00–0.20)
Basophils: 1 % (ref 0–2)
Eosinophils %: 1 % (ref 1–4)
Hematocrit: 41 % (ref 36.3–47.1)
Hemoglobin: 12.9 g/dL (ref 11.9–15.1)
Immature Granulocytes: 1 % — ABNORMAL HIGH
Lymphocytes: 12 % — ABNORMAL LOW (ref 24–43)
MCH: 31.6 pg (ref 25.2–33.5)
MCHC: 31.5 g/dL (ref 28.4–34.8)
MCV: 100.5 fL (ref 82.6–102.9)
MPV: 9.4 fL (ref 8.1–13.5)
Monocytes: 8 % (ref 3–12)
NRBC Automated: 0 per 100 WBC
Platelets: 235 10*3/uL (ref 138–453)
RBC: 4.08 m/uL (ref 3.95–5.11)
RDW: 15.4 % — ABNORMAL HIGH (ref 11.8–14.4)
Seg Neutrophils: 77 % — ABNORMAL HIGH (ref 36–65)
Segs Absolute: 9.47 10*3/uL — ABNORMAL HIGH (ref 1.50–8.10)
WBC: 12 10*3/uL — ABNORMAL HIGH (ref 3.5–11.3)

## 2018-09-17 LAB — POC PANEL (G3)-ART
FIO2: 30
POC HCO3: 25.9 mmol/L (ref 22–27)
POC O2 SAT: 96 %
POC PO2: 81 mm Hg (ref 75–95)
POC pCO2: 43 mm Hg (ref 32–45)
POC pH: 7.39 (ref 7.35–7.45)
Positive Base Excess, Art: 1 (ref 0.0–2.0)
Pt Temp: 37
TCO2 (calc), Art: 27 mmol/L (ref 23–28)

## 2018-09-17 LAB — POC PANEL (G3)-VEN
HCO3, Venous: 30.3 mmol/L — ABNORMAL HIGH (ref 24–30)
O2 Sat, Ven: 47 %
Positive Base Excess, Ven: 6 — ABNORMAL HIGH (ref 0.0–2.0)
Total CO2, Venous: 32 mmol/L — ABNORMAL HIGH (ref 25–31)
pCO2, Ven: 46 mm Hg (ref 39–55)
pH, Ven: 7.42 (ref 7.32–7.42)
pO2, Ven: 26 mm Hg — ABNORMAL LOW (ref 30–50)

## 2018-09-17 LAB — COMPREHENSIVE METABOLIC PANEL W/ REFLEX TO MG FOR LOW K
ALT: 22 U/L (ref 5–33)
AST: 36 U/L — ABNORMAL HIGH (ref <32)
Albumin: 4.1 g/dL (ref 3.5–5.2)
Alkaline Phosphatase: 103 U/L (ref 35–104)
Anion Gap: 14 mmol/L (ref 9–17)
BUN: 24 mg/dL — ABNORMAL HIGH (ref 8–23)
Bun/Cre Ratio: 24 — ABNORMAL HIGH (ref 9–20)
CO2: 25 mmol/L (ref 20–31)
Calcium: 10.2 mg/dL (ref 8.6–10.4)
Chloride: 101 mmol/L (ref 98–107)
Creatinine: 1.02 mg/dL — ABNORMAL HIGH (ref 0.50–0.90)
GFR African American: >60 mL/min (ref >60)
GFR Non-African American: 52 mL/min — ABNORMAL LOW (ref >60)
Glucose: 91 mg/dL (ref 70–99)
Potassium: 5.5 mmol/L — ABNORMAL HIGH (ref 3.7–5.3)
Sodium: 140 mmol/L (ref 135–144)
Total Bilirubin: 0.14 mg/dL — ABNORMAL LOW (ref 0.3–1.2)
Total Protein: 7.5 g/dL (ref 6.4–8.3)

## 2018-09-17 LAB — BASIC METABOLIC PANEL
Anion Gap: 15 mmol/L (ref 9–17)
BUN: 25 mg/dL — ABNORMAL HIGH (ref 8–23)
Bun/Cre Ratio: 26 — ABNORMAL HIGH (ref 9–20)
CO2: 21 mmol/L (ref 20–31)
Calcium: 9.8 mg/dL (ref 8.6–10.4)
Chloride: 99 mmol/L (ref 98–107)
Creatinine: 0.96 mg/dL — ABNORMAL HIGH (ref 0.50–0.90)
GFR African American: >60 mL/min (ref >60)
GFR Non-African American: 56 mL/min — ABNORMAL LOW (ref >60)
Glucose: 79 mg/dL (ref 70–99)
Potassium: 4.9 mmol/L (ref 3.7–5.3)
Sodium: 135 mmol/L (ref 135–144)

## 2018-09-17 LAB — ECHOCARDIOGRAM COMPLETE 2D W DOPPLER W COLOR: Left Ventricular Ejection Fraction: 25

## 2018-09-17 LAB — COVID-19: SARS-CoV-2, Rapid: NOT DETECTED

## 2018-09-17 LAB — D-DIMER, QUANTITATIVE: D-Dimer, Quant: 3.12 mg/L FEU — ABNORMAL HIGH (ref 0.00–0.59)

## 2018-09-17 LAB — LACTATE DEHYDROGENASE: LD: 310 U/L — ABNORMAL HIGH (ref 135–214)

## 2018-09-17 LAB — C-REACTIVE PROTEIN: CRP: 13 mg/L — ABNORMAL HIGH (ref 0.0–5.0)

## 2018-09-17 LAB — LACTIC ACID: Lactic Acid: 1.1 mmol/L (ref 0.5–2.2)

## 2018-09-17 LAB — FERRITIN: Ferritin: 269 ug/L — ABNORMAL HIGH (ref 13–150)

## 2018-09-17 LAB — BRAIN NATRIURETIC PEPTIDE: Pro-BNP: 8642 pg/mL — ABNORMAL HIGH (ref <300)

## 2018-09-17 MED ORDER — AMIODARONE HCL 200 MG PO TABS
200 MG | Freq: Every day | ORAL | Status: DC
Start: 2018-09-17 — End: 2018-09-25
  Administered 2018-09-17 – 2018-09-24 (×8): 200 mg via ORAL

## 2018-09-17 MED ORDER — INSULIN REGULAR HUMAN 100 UNIT/ML IJ SOLN
100 UNIT/ML | Freq: Once | INTRAMUSCULAR | Status: AC
Start: 2018-09-17 — End: 2018-09-17
  Administered 2018-09-17: 16:00:00 10 [IU] via INTRAVENOUS

## 2018-09-17 MED ORDER — ACETAMINOPHEN 650 MG RE SUPP
650 MG | Freq: Four times a day (QID) | RECTAL | Status: DC | PRN
Start: 2018-09-17 — End: 2018-09-25

## 2018-09-17 MED ORDER — NORMAL SALINE FLUSH 0.9 % IV SOLN
0.9 | Freq: Two times a day (BID) | INTRAVENOUS | Status: DC
Start: 2018-09-17 — End: 2018-09-25
  Administered 2018-09-19 – 2018-09-24 (×11): 10 mL via INTRAVENOUS

## 2018-09-17 MED ORDER — SODIUM CHLORIDE 0.9 % IV SOLN
0.9 | INTRAVENOUS | Status: DC
Start: 2018-09-17 — End: 2018-09-18
  Administered 2018-09-17: 19:00:00 via INTRAVENOUS

## 2018-09-17 MED ORDER — HYDROCODONE-ACETAMINOPHEN 5-325 MG PO TABS
5-325 MG | Freq: Four times a day (QID) | ORAL | Status: DC | PRN
Start: 2018-09-17 — End: 2018-09-22
  Administered 2018-09-17 – 2018-09-22 (×13): 1 via ORAL

## 2018-09-17 MED ORDER — POTASSIUM BICARB-CITRIC ACID 20 MEQ PO TBEF
20 MEQ | ORAL | Status: DC | PRN
Start: 2018-09-17 — End: 2018-09-17

## 2018-09-17 MED ORDER — NORMAL SALINE FLUSH 0.9 % IV SOLN
0.9 % | Freq: Two times a day (BID) | INTRAVENOUS | Status: DC
Start: 2018-09-17 — End: 2018-09-17

## 2018-09-17 MED ORDER — MAGNESIUM SULFATE IN D5W 1-5 GM/100ML-% IV SOLN
1-5100- GM/100ML-% | INTRAVENOUS | Status: DC | PRN
Start: 2018-09-17 — End: 2018-09-17

## 2018-09-17 MED ORDER — IOPAMIDOL 76 % IV SOLN
76 % | Freq: Once | INTRAVENOUS | Status: AC | PRN
Start: 2018-09-17 — End: 2018-09-17
  Administered 2018-09-17: 15:00:00 75 mL via INTRAVENOUS

## 2018-09-17 MED ORDER — DEXTROSE 50 % IV SOLN
50 % | INTRAVENOUS | Status: DC | PRN
Start: 2018-09-17 — End: 2018-09-25

## 2018-09-17 MED ORDER — NORMAL SALINE FLUSH 0.9 % IV SOLN
0.9 | INTRAVENOUS | Status: DC | PRN
Start: 2018-09-17 — End: 2018-09-25
  Administered 2018-09-20: 16:00:00 10 mL via INTRAVENOUS

## 2018-09-17 MED ORDER — SENNOSIDES 8.6 MG PO TABS
8.6 MG | Freq: Two times a day (BID) | ORAL | Status: DC | PRN
Start: 2018-09-17 — End: 2018-09-25
  Administered 2018-09-19 – 2018-09-24 (×5): 8.6 via ORAL

## 2018-09-17 MED ORDER — NORMAL SALINE FLUSH 0.9 % IV SOLN
0.9 % | INTRAVENOUS | Status: DC | PRN
Start: 2018-09-17 — End: 2018-09-17

## 2018-09-17 MED ORDER — ACETAMINOPHEN 325 MG PO TABS
325 MG | Freq: Four times a day (QID) | ORAL | Status: DC | PRN
Start: 2018-09-17 — End: 2018-09-25
  Administered 2018-09-18 – 2018-09-19 (×2): 650 mg via ORAL

## 2018-09-17 MED ORDER — AZITHROMYCIN 500 MG IV SOLR
500 MG | INTRAVENOUS | Status: DC
Start: 2018-09-17 — End: 2018-09-21
  Administered 2018-09-17 – 2018-09-21 (×5): 500 mg via INTRAVENOUS

## 2018-09-17 MED ORDER — FERROUS SULFATE 325 (65 FE) MG PO TBEC
325 (65 Fe) MG | Freq: Every day | ORAL | Status: DC
Start: 2018-09-17 — End: 2018-09-25
  Administered 2018-09-17 – 2018-09-24 (×8): 325 mg via ORAL

## 2018-09-17 MED ORDER — DEXTROSE 5 % IV SOLN
5 % | INTRAVENOUS | Status: DC | PRN
Start: 2018-09-17 — End: 2018-09-25

## 2018-09-17 MED ORDER — PRAMIPEXOLE DIHYDROCHLORIDE 0.25 MG PO TABS
0.25 MG | Freq: Every day | ORAL | Status: DC | PRN
Start: 2018-09-17 — End: 2018-09-25
  Administered 2018-09-18 – 2018-09-24 (×2): 0.25 mg via ORAL

## 2018-09-17 MED ORDER — HYDROCODONE-ACETAMINOPHEN 5-325 MG PO TABS
5-325 MG | Freq: Once | ORAL | Status: AC
Start: 2018-09-17 — End: 2018-09-17
  Administered 2018-09-17: 16:00:00 1 via ORAL

## 2018-09-17 MED ORDER — IPRATROPIUM-ALBUTEROL 0.5-2.5 (3) MG/3ML IN SOLN
RESPIRATORY_TRACT | Status: DC
Start: 2018-09-17 — End: 2018-09-19
  Administered 2018-09-17 – 2018-09-19 (×9): 1 via RESPIRATORY_TRACT

## 2018-09-17 MED ORDER — IPRATROPIUM-ALBUTEROL 0.5-2.5 (3) MG/3ML IN SOLN
Freq: Three times a day (TID) | RESPIRATORY_TRACT | Status: DC | PRN
Start: 2018-09-17 — End: 2018-09-25
  Administered 2018-09-17: 19:00:00 1 via RESPIRATORY_TRACT

## 2018-09-17 MED ORDER — IPRATROPIUM-ALBUTEROL 0.5-2.5 (3) MG/3ML IN SOLN
RESPIRATORY_TRACT | Status: DC | PRN
Start: 2018-09-17 — End: 2018-09-17

## 2018-09-17 MED ORDER — METHYLPREDNISOLONE SODIUM SUCC 40 MG IJ SOLR
40 MG | Freq: Four times a day (QID) | INTRAMUSCULAR | Status: DC
Start: 2018-09-17 — End: 2018-09-20
  Administered 2018-09-17 – 2018-09-20 (×13): 40 mg via INTRAVENOUS

## 2018-09-17 MED ORDER — IPRATROPIUM-ALBUTEROL 0.5-2.5 (3) MG/3ML IN SOLN
Freq: Once | RESPIRATORY_TRACT | Status: AC
Start: 2018-09-17 — End: 2018-09-17
  Administered 2018-09-17: 15:00:00 1 via RESPIRATORY_TRACT

## 2018-09-17 MED ORDER — CYCLOBENZAPRINE HCL 10 MG PO TABS
10 MG | Freq: Three times a day (TID) | ORAL | Status: DC
Start: 2018-09-17 — End: 2018-09-25
  Administered 2018-09-17 – 2018-09-24 (×21): 10 mg via ORAL

## 2018-09-17 MED ORDER — TIOTROPIUM BROMIDE MONOHYDRATE 2.5 MCG/ACT IN AERS
2.5 MCG/ACT | Freq: Every day | RESPIRATORY_TRACT | Status: DC
Start: 2018-09-17 — End: 2018-09-17

## 2018-09-17 MED ORDER — LEVOTHYROXINE SODIUM 25 MCG PO TABS
25 MCG | Freq: Every day | ORAL | Status: DC
Start: 2018-09-17 — End: 2018-09-25
  Administered 2018-09-17 – 2018-09-24 (×8): 25 ug via ORAL

## 2018-09-17 MED ORDER — FAMOTIDINE 20 MG PO TABS
20 MG | Freq: Every day | ORAL | Status: DC
Start: 2018-09-17 — End: 2018-09-25
  Administered 2018-09-17 – 2018-09-24 (×8): 20 mg via ORAL

## 2018-09-17 MED ORDER — PREDNISONE 20 MG PO TABS
20 MG | Freq: Once | ORAL | Status: AC
Start: 2018-09-17 — End: 2018-09-17
  Administered 2018-09-17: 14:00:00 50 mg via ORAL

## 2018-09-17 MED ORDER — DEXTROSE 50 % IV SOLN
50 % | Freq: Once | INTRAVENOUS | Status: AC
Start: 2018-09-17 — End: 2018-09-17
  Administered 2018-09-17: 16:00:00 25 g via INTRAVENOUS

## 2018-09-17 MED ORDER — HEPARIN SODIUM (PORCINE) 5000 UNIT/ML IJ SOLN
5000 UNIT/ML | Freq: Two times a day (BID) | INTRAMUSCULAR | Status: DC
Start: 2018-09-17 — End: 2018-09-25
  Administered 2018-09-18 – 2018-09-24 (×12): 5000 [IU] via SUBCUTANEOUS

## 2018-09-17 MED ORDER — ACETAMINOPHEN 325 MG PO TABS
325 MG | ORAL | Status: DC | PRN
Start: 2018-09-17 — End: 2018-09-17

## 2018-09-17 MED ORDER — NORMAL SALINE FLUSH 0.9 % IV SOLN
0.9 % | Freq: Two times a day (BID) | INTRAVENOUS | Status: DC
Start: 2018-09-17 — End: 2018-09-25
  Administered 2018-09-17 – 2018-09-24 (×10): 10 mL via INTRAVENOUS

## 2018-09-17 MED ORDER — GLUCOSE 40 % PO GEL
40 % | ORAL | Status: DC | PRN
Start: 2018-09-17 — End: 2018-09-25

## 2018-09-17 MED ORDER — POTASSIUM CHLORIDE CRYS ER 20 MEQ PO TBCR
20 MEQ | ORAL | Status: DC | PRN
Start: 2018-09-17 — End: 2018-09-17

## 2018-09-17 MED ORDER — FUROSEMIDE 10 MG/ML IJ SOLN
10 MG/ML | Freq: Every day | INTRAMUSCULAR | Status: DC
Start: 2018-09-17 — End: 2018-09-17
  Administered 2018-09-17: 16:00:00 40 mg via INTRAVENOUS

## 2018-09-17 MED ORDER — PROMETHAZINE HCL 12.5 MG PO TABS
12.5 MG | Freq: Four times a day (QID) | ORAL | Status: DC | PRN
Start: 2018-09-17 — End: 2018-09-25

## 2018-09-17 MED ORDER — ONDANSETRON HCL 4 MG/2ML IJ SOLN
4 MG/2ML | Freq: Four times a day (QID) | INTRAMUSCULAR | Status: DC | PRN
Start: 2018-09-17 — End: 2018-09-25

## 2018-09-17 MED ORDER — MIRTAZAPINE 15 MG PO TABS
15 MG | Freq: Every evening | ORAL | Status: DC
Start: 2018-09-17 — End: 2018-09-25
  Administered 2018-09-18 – 2018-09-24 (×7): 15 mg via ORAL

## 2018-09-17 MED ORDER — POTASSIUM CHLORIDE 10 MEQ/100ML IV SOLN
10 | INTRAVENOUS | Status: DC | PRN
Start: 2018-09-17 — End: 2018-09-17

## 2018-09-17 MED ORDER — GLUCAGON HCL RDNA (DIAGNOSTIC) 1 MG IJ SOLR
1 MG | INTRAMUSCULAR | Status: DC | PRN
Start: 2018-09-17 — End: 2018-09-25

## 2018-09-17 MED ORDER — BUDESONIDE-FORMOTEROL FUMARATE 160-4.5 MCG/ACT IN AERO
Freq: Two times a day (BID) | RESPIRATORY_TRACT | Status: DC
Start: 2018-09-17 — End: 2018-09-25
  Administered 2018-09-18 – 2018-09-24 (×13): 2 via RESPIRATORY_TRACT

## 2018-09-17 MED ORDER — SODIUM CHLORIDE 0.9 % IV BOLUS
0.9 % | Freq: Once | INTRAVENOUS | Status: AC
Start: 2018-09-17 — End: 2018-09-17
  Administered 2018-09-17: 15:00:00 80 mL via INTRAVENOUS

## 2018-09-17 MED FILL — HYDROCODONE-ACETAMINOPHEN 5-325 MG PO TABS: 5-325 MG | ORAL | Qty: 1

## 2018-09-17 MED FILL — FERROUS SULFATE 325 (65 FE) MG PO TBEC: 325 (65 Fe) MG | ORAL | Qty: 1

## 2018-09-17 MED FILL — FUROSEMIDE 10 MG/ML IJ SOLN: 10 MG/ML | INTRAMUSCULAR | Qty: 4

## 2018-09-17 MED FILL — SOLU-MEDROL 40 MG IJ SOLR: 40 MG | INTRAMUSCULAR | Qty: 40

## 2018-09-17 MED FILL — LEVOTHYROXINE SODIUM 25 MCG PO TABS: 25 MCG | ORAL | Qty: 1

## 2018-09-17 MED FILL — PRAMIPEXOLE DIHYDROCHLORIDE 0.25 MG PO TABS: 0.25 MG | ORAL | Qty: 1

## 2018-09-17 MED FILL — IPRATROPIUM-ALBUTEROL 0.5-2.5 (3) MG/3ML IN SOLN: RESPIRATORY_TRACT | Qty: 3

## 2018-09-17 MED FILL — AMIODARONE HCL 200 MG PO TABS: 200 MG | ORAL | Qty: 1

## 2018-09-17 MED FILL — SYMBICORT 160-4.5 MCG/ACT IN AERO: RESPIRATORY_TRACT | Qty: 6

## 2018-09-17 MED FILL — FAMOTIDINE 20 MG PO TABS: 20 MG | ORAL | Qty: 1

## 2018-09-17 MED FILL — PREDNISONE 20 MG PO TABS: 20 MG | ORAL | Qty: 3

## 2018-09-17 MED FILL — AZITHROMYCIN 500 MG IV SOLR: 500 MG | INTRAVENOUS | Qty: 500

## 2018-09-17 MED FILL — DEXTROSE 50 % IV SOLN: 50 % | INTRAVENOUS | Qty: 50

## 2018-09-17 MED FILL — HEPARIN SODIUM (PORCINE) 5000 UNIT/ML IJ SOLN: 5000 UNIT/ML | INTRAMUSCULAR | Qty: 1

## 2018-09-17 MED FILL — INSULIN REGULAR HUMAN 100 UNIT/ML IJ SOLN: 100 UNIT/ML | INTRAMUSCULAR | Qty: 0.1

## 2018-09-17 MED FILL — CYCLOBENZAPRINE HCL 10 MG PO TABS: 10 MG | ORAL | Qty: 1

## 2018-09-17 NOTE — ED Notes (Signed)
Son called EMS today. She lives at home with her husband who has mild dementia. She cares fpor him. Has a hx of COPD and CHF and family states every 3-4 months she has issues with this and ends up in the hospital. They are concerned she may need some help in the home. On arrival RR is high, 30's. Her sats are good 97% on 2 LNC, but increased WOB. Skin is warm, and dry. She has 2 old skin tear/ wounds on bilateral shins which are healing. A+Ox4. Family states that she has some issues with making decisions.      Gemma Payor, RN  09/17/18 1152

## 2018-09-17 NOTE — Progress Notes (Signed)
The potassium/magnesium sliding scale has been automatically discontinued per [facility] approved policy because the patient has decreased renal function (CrCl<30 ml/min).  The patient's current K/Mag levels are currently:    Recent Labs     09/17/18  0851   K 5.5*       CrCl cannot be calculated (Unknown ideal weight.).  For patients with decreased renal function (below 85ml/min) needing potassium/magnesium supplementation, please order individual bolus doses with appropriate monitoring.      Please contact the inpatient pharmacy at [phone] with any concerns.  Thank you.    Lenn Sink RPh  09/17/2018  12:57 PM

## 2018-09-17 NOTE — H&P (Signed)
History & Physical  [facility]    Adult Hospitalist      Name: Katherine Cunningham  MRN: [id number]     Acct: [id number]  Room: STA21/21    Admit Date: 09/17/2018  8:28 AM  PCP: Gwenette Greet, MD    Primary Problem  Active Problems:    CHF (congestive heart failure), NYHA class II, acute, combined (HCC)  Resolved Problems:    * No resolved hospital problems. *        Assesment:     ?? Acute systolic CHF  ?? Elevated troponin  ?? Acute COPD exacerbation  ?? Acute respiratory insufficiency  ?? Right lung base pulmonary nodule  ?? Hyperkalemia  ?? Acute renal insufficiency  ?? Hypothyroidism  ?? Cardiomyopathy status post AICD  ?? History of bladder cancer        Plan:     ?? Admit to intermediate level oxygen O2 maintain oxygen saturation greater than 92%  ?? Serial troponin  ?? Echocardiogram  ?? IV Lasix  ?? IV Solu-Medrol  ?? Duo nebs  ?? Insulin plus dextrose  ?? Monitor BMP twice daily  ?? Holding spironolactone and  ?? Normal saline IV fluids  ?? Monitor creatinine  ?? Continue amiodarone  ?? Continue Synthroid, Remeron, Mirapex  ?? Cardiology consult  ?? Pulmonology consult  ?? Nephrology consult  ?? DVT and GI prophylaxis.        Chief Complaint:     Chief Complaint   Patient presents with   ??? Shortness of Breath     unable to crae for self at home         History of Present Illness:      Katherine Cunningham is a 85 y.o.  female who presents with Shortness of Breath (unable to crae for self at home)    85 year old lady with past medical history of CHF, coronary disease status post AICD, COPD, hypothyroidism presented to ER complaining of shortness of breath.  Patient lives at home with her husband who has dementia.  Patient noticed to have progressive shortness of breath with worsening in the past 24 hours.  Patient denies any fever, chills, cough, nausea, vomiting, change urination, bowel habit.  Patient uses 2 L home O2 at night.  On presentation to ER sheWas tachypneic with respiratory rate in 34 and was requiring 2 to 4 L to maintain  her oxygen saturation.  There is a concern by paramedics that patient is probably not able to take care of herself at home.On presentation her BNP was 8642, her troponin was 40, her potassium was 5.5, creatinine was 1.02.  Her LDH was 310, ferritin was 269, d-dimer was 3.12.  Her WBC was 12,000.  Her COVID test was negative.  She has a CTA chest which was negative for PE but shows severe emphysematous changes, pulmonary nodules at the right lung base, bronchiectasis..  Patient admitted with cardiology and pulmonology consult for further management.  I have personally reviewed the past medical history, past surgical history, medications, social history, and family history, and summarized in the note.    Review of Systems:     All 10 point system is reviewed and negative otherwise mentioned in HPI.      Past Medical History:     Past Medical History:   Diagnosis Date   ??? Bladder cancer (HCC)     remission   ??? CHF (congestive heart failure) (HCC)     stage 4   ??? COPD (chronic obstructive  pulmonary disease) (HCC)    ??? Neck injury     has injections done for disk issue   ??? Thyroid disease         Past Surgical History:     Past Surgical History:   Procedure Laterality Date   ??? PACEMAKER PLACEMENT      with defibrillator        Medications Prior to Admission:       Prior to Admission medications    Medication Sig Start Date End Date Taking? Authorizing Provider   amiodarone (CORDARONE) 200 MG tablet Take 1 tablet by mouth daily   Yes Historical Provider, MD   ferrous sulfate (IRON 325) 325 (65 Fe) MG tablet Take 1 tablet by mouth daily   Yes Historical Provider, MD   ipratropium-albuterol (DUONEB) 0.5-2.5 (3) MG/3ML SOLN nebulizer solution Take 1 nebule by mouth every 8 hours as needed   Yes Historical Provider, MD   levothyroxine (SYNTHROID) 25 MCG tablet Take 1 tablet by mouth daily   Yes Historical Provider, MD   lidocaine (LIDODERM) 5 % Place 1 patch onto the skin as needed 11/17/17  Yes Historical Provider, MD    mirtazapine (REMERON) 15 MG tablet Take 15 mg by mouth nightly  08/31/17  Yes Historical Provider, MD   pramipexole (MIRAPEX) 0.25 MG tablet Take 1 tablet by mouth daily as needed  12/30/17  Yes Historical Provider, MD   budesonide-formoterol (SYMBICORT) 160-4.5 MCG/ACT AERO Take 2 puffs by mouth 2 times daily   Yes Historical Provider, MD   albuterol sulfate HFA (PROAIR HFA) 108 (90 Base) MCG/ACT inhaler Take 2 puffs by mouth every 4 hours as needed   Yes Historical Provider, MD   tiotropium (SPIRIVA RESPIMAT) 1.25 MCG/ACT AERS inhaler Take 2 puffs by mouth daily   Yes Historical Provider, MD   spironolactone (ALDACTONE) 25 MG tablet Take 0.5 tablets by mouth daily   Yes Historical Provider, MD   cyclobenzaprine (FLEXERIL) 10 MG tablet Take 1 tablet by mouth 3 times daily    Historical Provider, MD   silver sulfADIAZINE (SILVADENE) 1 % cream Apply 1 inch topically as needed 01/21/18   Historical Provider, MD   acetaminophen-codeine (TYLENOL/CODEINE #3) 300-30 MG per tablet Take 1 tablet by mouth every 8 hours as needed. 11/17/17   Historical Provider, MD        Allergies:       Aspirin and Penicillins    Social History:     Tobacco:    reports that she quit smoking about 15 years ago. She has never used smokeless tobacco.  Alcohol:      reports no history of alcohol use.  Drug Use:  reports no history of drug use.    Family History:     History reviewed. No pertinent family history.      Physical Exam:     Vitals:  BP 136/63    Pulse 81    Temp 97.3 ??F (36.3 ??C) (Axillary)    Resp 24    Ht 5\' 2"  (1.575 m)    Wt 85 lb (38.6 kg)    SpO2 98%    BMI 15.55 kg/m??   Temp (24hrs), Avg:97.3 ??F (36.3 ??C), Min:97.3 ??F (36.3 ??C), Max:97.3 ??F (36.3 ??C)          General appearance - alert, frail, and in no moderate distress  Mental status - oriented to person, place, and time with normal affect  Head - normocephalic and atraumatic  Eyes - pupils equal  and reactive, extraocular eye movements intact, conjunctiva clear  Ears - hearing  appears to be intact  Nose - no drainage noted  Mouth - mucous membranes moist  Neck - supple, no carotid bruits, thyroid not palpable  Chest -basal crackles, wheezing, increased effort  Heart - normal rate, regular rhythm, no murmur  Abdomen - soft, nontender, nondistended, bowel sounds present all four quadrants, no masses, hepatomegaly or splenomegaly  Neurological - normal speech, no focal findings or movement disorder noted, cranial nerves II through XII grossly intact  Extremities -2+ peripheral edema  Skin - no gross lesions, rashes, or induration noted        Data:     Labs:    Hematology:  Recent Labs     09/17/18  0851   WBC 12.0*   RBC 4.08   HGB 12.9   HCT 41.0   MCV 100.5   MCH 31.6   MCHC 31.5   RDW 15.4*   PLT 235   MPV 9.4   DDIMER 3.12*     Chemistry:  Recent Labs     09/17/18  0851   NA 140   K 5.5*   CL 101   CO2 25   GLUCOSE 91   BUN 24*   CREATININE 1.02*   ANIONGAP 14   LABGLOM 52*   GFRAA >60   CALCIUM 10.2   PROBNP 8,642*   TROPHS 40*     Recent Labs     09/17/18  0851   PROT 7.5   LABALBU 4.1   AST 36*   ALT 22   LDH 310*   ALKPHOS 103   BILITOT 0.14*       No results found for: INR, PROTIME    No results found for: SPECIAL  No results found for: CULTURE    Lab Results   Component Value Date    FIO2 NOT REPORTED 09/17/2018       Radiology:    Xr Chest Portable    Result Date: 09/17/2018  Borderline cardiac size.  Findings of generalized COPD with appearance of biapical scarring.  Opacity at the right base laterally which may represent focal airspace disease.     Ct Chest Pulmonary Embolism W Contrast    Result Date: 09/17/2018  1.  No evidence of pulmonary embolus. 2.  Severe emphysematous changes in the lungs. 3.  Pulmonary nodules at the right lung base. 4.  Bronchiectasis.  No focal consolidation or effusion. 5.  Atherosclerotic disease. RECOMMENDATIONS: Fleischner Society guidelines for follow-up and management of incidentally detected pulmonary nodules: Multiple Solid Nodules: Nodule size  greater than 8 mm In a low-risk patient, CT at 3-6 months, then consider CT at 18-24 months. In a high-risk patient, CT at 3-6 months, then CT at 18-24 months. - Low risk patients include individuals with minimal or absent history of smoking and other known risk factors. - High risk patients include individuals with a history or smoking or known risk factors. Radiology 2017 http://pubs.https://www.stafford-myers.com/rsna.org/doi/full/10.1148/radiol.[email or web address]         All radiological studies reviewed                Code Status:  No Order    Electronically signed by Phineas InchesSaudia Latrelle Bazar, MD on 09/17/2018 at 11:25 AM     Copy sent to Dr. Gwenette Greetalla, Rekha, MD    This note was created with the assistance of a speech-recognition program.  Although the intention is to generate a document that actually reflects the content of  the visit, no guarantees can be provided that every mistake has been identified and corrected by editing.     Note was updated later by me after  physical examination and  completion of the assessment.

## 2018-09-17 NOTE — Care Coordination-Inpatient (Signed)
Patient is presenting to ED with Shortness of Breath. Per the son and daughter in law, patient takes care of 83 year old husband who has early onset Dementia. They stated that patient has had medical decline but is doing well getting around the home. They stated she is refusing to allow children to set up medications and are concerned about medication mis-management. They requested that patient be set up with a home care agency-RN/PT to assist with med management, hoping that patient will listen to RN and take meds appropriately. They chose [facility]. Referral will be sent at discharge.    The Plan for Transition of Care is related to the following treatment goals: home care    The Patient and/or patient representative Katherine Asp and Katherine Cunningham was provided with a choice of provider and agrees with the discharge plan. [x]  Yes []  No    Freedom of choice list was provided with basic dialogue that supports the patient's individualized plan of care/goals, treatment preferences and shares the quality data associated with the providers. [x]  Yes []  No

## 2018-09-17 NOTE — Consults (Addendum)
Reason for Consult:  Acute kidney injury.    Requesting Physician:  Phineas Inches, MD    HISTORY OF PRESENT ILLNESS:    The patient is a 85 y.o. female who presents with shortness of breath. She had iv contrast with CTA chest today. No previous labs available in our system for comparison.. On admission she was noted to have elevated creatinine of 1.02 with a potassium level of 5.5. Presently on BiPAP. Denies any problems with nausea, vomiting, appetite, diarrhea or difficulty with urination. Denies any recent use of NSAIDs.    Review Of Systems:   Constitutional: No fever, chills, lethargy, weakness or wt loss.  HEENT:  No headache, nasal discharge or sore throat.  Cardiac:  No chest pain, dyspnea, orthopnea or PND.  Chest:              No cough, phlegm or wheezing.  Abdomen:  No abdominal pain, nausea, vomiting or diarrhea.  Neuro:   No gross focal weakness, numbness, abnormal movements or seizure like activity.  Skin:   No rashes or itching.  GU:   No hematuria, pyuria, dysuria or flank pain.  Extremities:  No swelling or joint pains.  Endocrine: No polyuria, polydypsia, or thyroid problems.  Hematology:    No bleeding disorders, bruising or anemia.  All other ROS is negative.      Past Medical History:   Diagnosis Date   ??? Bladder cancer (HCC)     remission   ??? CHF (congestive heart failure) (HCC)     stage 4   ??? COPD (chronic obstructive pulmonary disease) (HCC)    ??? Neck injury     has injections done for disk issue   ??? Thyroid disease        Past Surgical History:   Procedure Laterality Date   ??? PACEMAKER PLACEMENT      with defibrillator       Prior to Admission medications    Medication Sig Start Date End Date Taking? Authorizing Provider   amiodarone (CORDARONE) 200 MG tablet Take 1 tablet by mouth daily   Yes Historical Provider, MD   ferrous sulfate (IRON 325) 325 (65 Fe) MG tablet Take 1 tablet by mouth daily   Yes Historical Provider, MD   ipratropium-albuterol (DUONEB) 0.5-2.5 (3) MG/3ML SOLN  nebulizer solution Take 1 nebule by mouth every 8 hours as needed   Yes Historical Provider, MD   levothyroxine (SYNTHROID) 25 MCG tablet Take 1 tablet by mouth daily   Yes Historical Provider, MD   lidocaine (LIDODERM) 5 % Place 1 patch onto the skin as needed 11/17/17  Yes Historical Provider, MD   mirtazapine (REMERON) 15 MG tablet Take 15 mg by mouth nightly  08/31/17  Yes Historical Provider, MD   pramipexole (MIRAPEX) 0.25 MG tablet Take 1 tablet by mouth daily as needed  12/30/17  Yes Historical Provider, MD   budesonide-formoterol (SYMBICORT) 160-4.5 MCG/ACT AERO Take 2 puffs by mouth 2 times daily   Yes Historical Provider, MD   albuterol sulfate HFA (PROAIR HFA) 108 (90 Base) MCG/ACT inhaler Take 2 puffs by mouth every 4 hours as needed   Yes Historical Provider, MD   tiotropium (SPIRIVA RESPIMAT) 1.25 MCG/ACT AERS inhaler Take 2 puffs by mouth daily   Yes Historical Provider, MD   spironolactone (ALDACTONE) 25 MG tablet Take 0.5 tablets by mouth daily   Yes Historical Provider, MD   cyclobenzaprine (FLEXERIL) 10 MG tablet Take 1 tablet by mouth 3 times daily  Historical Provider, MD   silver sulfADIAZINE (SILVADENE) 1 % cream Apply 1 inch topically as needed 01/21/18   Historical Provider, MD   acetaminophen-codeine (TYLENOL/CODEINE #3) 300-30 MG per tablet Take 1 tablet by mouth every 8 hours as needed. 11/17/17   Historical Provider, MD       Scheduled Meds:  ??? sodium chloride flush  10 mL Intravenous BID   ??? sodium chloride flush  10 mL Intravenous 2 times per day   ??? amiodarone  200 mg Oral Daily   ??? cyclobenzaprine  10 mg Oral TID   ??? ferrous sulfate  325 mg Oral Daily   ??? levothyroxine  25 mcg Oral Daily   ??? mirtazapine  15 mg Oral Nightly   ??? budesonide-formoterol  2 puff Inhalation BID   ??? tiotropium  1 puff Inhalation Daily   ??? famotidine  20 mg Oral Daily   ??? heparin (porcine)  5,000 Units Subcutaneous BID   ??? furosemide  40 mg Intravenous Daily   ??? methylPREDNISolone  40 mg Intravenous Q6H   ???  azithromycin  500 mg Intravenous Q24H     Continuous Infusions:  ??? sodium chloride     ??? dextrose       PRN Meds:sodium chloride flush, ipratropium-albuterol, pramipexole, HYDROcodone 5 mg - acetaminophen, acetaminophen **OR** acetaminophen, senna, promethazine **OR** ondansetron, glucose, dextrose, glucagon (rDNA), dextrose    Allergies   Allergen Reactions   ??? Aspirin Other (See Comments)   ??? Penicillins Rash       Social History     Socioeconomic History   ??? Marital status: Married     Spouse name: Not on file   ??? Number of children: Not on file   ??? Years of education: Not on file   ??? Highest education level: Not on file   Occupational History   ??? Not on file   Social Needs   ??? Financial resource strain: Not on file   ??? Food insecurity     Worry: Not on file     Inability: Not on file   ??? Transportation needs     Medical: Not on file     Non-medical: Not on file   Tobacco Use   ??? Smoking status: Former Smoker     Last attempt to quit: 2005     Years since quitting: 15.3   ??? Smokeless tobacco: Never Used   Substance and Sexual Activity   ??? Alcohol use: Never     Frequency: Never   ??? Drug use: Never   ??? Sexual activity: Not on file   Lifestyle   ??? Physical activity     Days per week: Not on file     Minutes per session: Not on file   ??? Stress: Not on file   Relationships   ??? Social Wellsite geologistconnections     Talks on phone: Not on file     Gets together: Not on file     Attends religious service: Not on file     Active member of club or organization: Not on file     Attends meetings of clubs or organizations: Not on file     Relationship status: Not on file   ??? Intimate partner violence     Fear of current or ex partner: Not on file     Emotionally abused: Not on file     Physically abused: Not on file     Forced sexual activity: Not on file  Other Topics Concern   ??? Not on file   Social History Narrative   ??? Not on file       History reviewed. No pertinent family history.      Physical Exam:  Vitals:    09/17/18 1057  09/17/18 1124 09/17/18 1154 09/17/18 1155   BP: 136/63 115/63 (!) 137/109    Pulse: 81 81 88 83   Resp: 24 26     Temp:       TempSrc:       SpO2: 98% (!) 89%  97%   Weight:       Height:         No intake/output data recorded.    General:  Awake, alert, not in distress. Appears to be stated age.  HEENT: Atraumatic, normocephalic. Anicteric sclera. Pink and moist oral mucosa. No carotid bruit. No JVD.  Chest: Bilateral air entry, clear to auscultation, no wheezing, rhonchi or rales.  Cardiovascular: RRR, S1S2, no murmur, rub or gallop. No lower extremity edema.    Abdomen: Soft, non tender to palpation. Active bowel sounds x 4 quadrants.  Musculoskeletal: Active ROM x 4 extremities. No cyanosis or clubbing.  Integumentary: Pink, warm and dry. Free from rash or lesions. Skin turgor normal.  CNS: Speech clear. Face symmetrical. No tremor.     Data:  CBC:   Lab Results   Component Value Date    WBC 12.0 (H) 09/17/2018    HGB 12.9 09/17/2018    HCT 41.0 09/17/2018    MCV 100.5 09/17/2018    PLT 235 09/17/2018     BMP:    Lab Results   Component Value Date    NA 140 09/17/2018    K 5.5 (H) 09/17/2018    CL 101 09/17/2018    CO2 25 09/17/2018    BUN 24 (H) 09/17/2018    CREATININE 1.02 (H) 09/17/2018    GLUCOSE 91 09/17/2018     CMP:   Lab Results   Component Value Date    NA 140 09/17/2018    K 5.5 09/17/2018    CL 101 09/17/2018    CO2 25 09/17/2018    BUN 24 09/17/2018    CREATININE 1.02 09/17/2018    GLUCOSE 91 09/17/2018    CALCIUM 10.2 09/17/2018    PROT 7.5 09/17/2018    LABALBU 4.1 09/17/2018    BILITOT 0.14 09/17/2018    ALKPHOS 103 09/17/2018    AST 36 09/17/2018    ALT 22 09/17/2018      Hepatic:   Lab Results   Component Value Date    AST 36 (H) 09/17/2018    ALT 22 09/17/2018    BILITOT 0.14 (L) 09/17/2018    ALKPHOS 103 09/17/2018     BNP: No results found for: BNP  Lipids: No results found for: CHOL, HDL  INR: No results found for: INR  PTH: No results found for: PTH  Phosphorus:  No results found for:  PHOS  Ionized Calcium: No results found for: IONCA  Magnesium: No results found for: MG  Albumin:   Lab Results   Component Value Date    LABALBU 4.1 09/17/2018     Last 3 CK, CKMB, Troponin: (CKTOTAL:3,CKMB:3,TROPONINI:3)       URINE:)No results found for: Ofilia Neas     Radiology:   Reviewed.    Assessment:  1. Acute kidney injury, appears to be hemodynamically related vs CKD stage 3.   2. Exacerbation of COPD.  3. Hyperkalemia,  secondary to AKI.  4. Elevated calcium.    Plan:  1. Agree with holding Aldactone. On IVF. We will hold Lasix for now.  2. Comprehensive urine testing including urinalysis, urine sodium, creatinine (FENa), eosinophils and urine protein and creatinine to quantify any proteinuria ordered. We will order work up for hypercalcemia.   3. We will check renal and bladder ultrasound to r/o element of obstruction and to assess the kidney size/echotexture.  4. Renal diet/TF with oral fluid restriction of 1000 ml/24 hours.   5. Avoid hypotension, nephrotoxic drugs, Lovenox, Fleets enema and IV contrast exposure.  5. Follow up chemistries ordered for later today and for AM.    Thank you for the consultation. Please do not hesitate to contact us for any further questions/concerns. We will continue to follow along with you.       Electronically signed by Margret Chance, MD  on 09/17/2018 at 1:26 PM   [facility].  Ph: [phone]

## 2018-09-17 NOTE — Plan of Care (Signed)
Problem: Falls - Risk of:  Goal: Will remain free from falls  Description: Will remain free from falls  09/17/2018 2119 by Ihor Gully, RN  Outcome: Ongoing     Problem: Falls - Risk of:  Goal: Absence of physical injury  Description: Absence of physical injury  09/17/2018 2119 by Ihor Gully, RN  Outcome: Ongoing     Problem: Pain:  Goal: Pain level will decrease  Description: Pain level will decrease  09/17/2018 2119 by Ihor Gully, RN  Outcome: Ongoing     Problem: Pain:  Goal: Control of acute pain  Description: Control of acute pain  09/17/2018 2119 by Ihor Gully, RN  Outcome: Ongoing     Problem: Pain:  Goal: Control of chronic pain  Description: Control of chronic pain  09/17/2018 2119 by Ihor Gully, RN  Outcome: Ongoing

## 2018-09-17 NOTE — ED Provider Notes (Addendum)
EMERGENCY DEPARTMENT ENCOUNTER    Pt Name: Katherine Cunningham  MRN: [id number]  Birthdate 1932/05/18  Date of evaluation: 09/17/18  CHIEF COMPLAINT       Chief Complaint   Patient presents with   ??? Shortness of Breath     unable to crae for self at home     HISTORY OF PRESENT ILLNESS   Patient is a 85 year old female with PMH of CAD s/p AICD, COPD who is brought in by ambulance for shortness of breath.  She has shortness of breath at baseline, however, yesterday symptoms became more severe.  No fever, no cough, no chest pain.  Also, no abdominal pain, nausea, vomiting, changes in urine or stool.  She is on 2 L of home O2 during the night.  Paramedics state that her son called 911 and believes his mother has not been taking care of herself and is unable to take care of her husband who is in his 590s and has dementia.    REVIEW OF SYSTEMS     Review of Systems   All other systems reviewed and are negative.    PASTMEDICAL HISTORY     Past Medical History:   Diagnosis Date   ??? Bladder cancer (HCC)     remission   ??? CHF (congestive heart failure) (HCC)     stage 4   ??? COPD (chronic obstructive pulmonary disease) (HCC)    ??? Neck injury     has injections done for disk issue   ??? Thyroid disease      SURGICAL HISTORY       Past Surgical History:   Procedure Laterality Date   ??? PACEMAKER PLACEMENT      with defibrillator     CURRENT MEDICATIONS       Previous Medications    ACETAMINOPHEN (TYLENOL) 325 MG SUPPOSITORY    Place 2 suppositories rectally as needed    ACETAMINOPHEN-CODEINE (TYLENOL/CODEINE #3) 300-30 MG PER TABLET    Take 1 tablet by mouth every 8 hours as needed.    ALBUTEROL SULFATE HFA (PROAIR HFA) 108 (90 BASE) MCG/ACT INHALER    Take 2 puffs by mouth every 4 hours as needed    AMIODARONE (CORDARONE) 200 MG TABLET    Take 1 tablet by mouth daily    BUDESONIDE-FORMOTEROL (SYMBICORT) 160-4.5 MCG/ACT AERO    Take 2 puffs by mouth 2 times daily    CYCLOBENZAPRINE (FLEXERIL) 10 MG TABLET    Take 1 tablet by mouth 3 times  daily    FERROUS SULFATE (IRON 325) 325 (65 FE) MG TABLET    Take 1 tablet by mouth daily    IPRATROPIUM-ALBUTEROL (DUONEB) 0.5-2.5 (3) MG/3ML SOLN NEBULIZER SOLUTION    Take 1 nebule by mouth every 8 hours as needed    LEVOTHYROXINE (SYNTHROID) 25 MCG TABLET    Take 1 tablet by mouth daily    LIDOCAINE (LIDODERM) 5 %    Place 1 patch onto the skin as needed    MIRTAZAPINE (REMERON) 15 MG TABLET    Take 1.5 tablets by mouth nightly    PRAMIPEXOLE (MIRAPEX) 0.25 MG TABLET    Take 1 tablet by mouth as needed    SILVER SULFADIAZINE (SILVADENE) 1 % CREAM    Apply 1 inch topically as needed    SPIRONOLACTONE (ALDACTONE) 25 MG TABLET    Take 0.5 tablets by mouth daily    TIOTROPIUM (SPIRIVA RESPIMAT) 1.25 MCG/ACT AERS INHALER    Take 2 puffs by mouth daily  ALLERGIES     is allergic to aspirin and penicillins.  FAMILY HISTORY     has no family status information on file.      SOCIAL HISTORY       Social History     Tobacco Use   ??? Smoking status: Former Smoker     Last attempt to quit: 2005     Years since quitting: 15.3   ??? Smokeless tobacco: Never Used   Substance Use Topics   ??? Alcohol use: Never     Frequency: Never   ??? Drug use: Never     PHYSICAL EXAM     INITIAL VITALS: BP (!) 149/72    Pulse 73    Resp 21    Ht 5\' 2"  (1.575 m)    Wt 85 lb (38.6 kg)    SpO2 97%    BMI 15.55 kg/m??    Physical Exam  HENT:      Head: Normocephalic.      Right Ear: External ear normal.      Left Ear: External ear normal.      Nose: Nose normal.   Eyes:      Conjunctiva/sclera: Conjunctivae normal.   Cardiovascular:      Rate and Rhythm: Normal rate.   Pulmonary:      Breath sounds: Wheezing present.   Abdominal:      General: Abdomen is flat.   Skin:     General: Skin is dry.   Neurological:      Mental Status: She is alert. Mental status is at baseline.   Psychiatric:         Mood and Affect: Mood normal.         Behavior: Behavior normal.         MEDICAL DECISION MAKING:   The patient is hypoxic requiring 3 L O2 NC, afebrile,  nontoxic-appearing.  Physical exam notable for wheezes bilaterally.  Based on history and exam likely COPD exacerbation also consider CHF exacerbation.  Less likely ACS, PE.  Will rule out Covid 19.  ED plan for [facility] labs, EKG, troponin, BNP, chest x-ray, rapid Covid 19 test  ED Course as of Sep 17 1054   Fri Sep 17, 2018   1024 Labs remarkable for:    D-dimer 3.12    Troponin 40    BNP 8642    Creatinine 1.02    Potassium 5.5    WBC 12    Ferritin 269    LD 310    COVID rapid test negative.    I have ordered CT angios to rule out PE.      [JP]   1055 I discussed case with Dr. Hughes Better, accepts patient for admission to hospital.  She requests a pulmonology and cardiology consult.    [JP]      ED Course User Index  [JP] Lelon Frohlich, MD       DIAGNOSTIC RESULTS   EKG:All EKG's are interpreted by the Emergency Department Physician who either signs or Co-signs this chart in the absence of a cardiologist.        RADIOLOGY:All plain film, CT, MRI, and formal ultrasound images (except ED bedside ultrasound) are read by the radiologist, see reports below, unless otherwisenoted in MDM or here.  CT CHEST PULMONARY EMBOLISM W CONTRAST   Preliminary Result   1.  No evidence of pulmonary embolus.      2.  Severe emphysematous changes in the lungs.  3.  Pulmonary nodules at the right lung base.      4.  Bronchiectasis.  No focal consolidation or effusion.      5.  Atherosclerotic disease.      RECOMMENDATIONS:   Fleischner Society guidelines for follow-up and management of incidentally   detected pulmonary nodules:      Multiple Solid Nodules:      Nodule size greater than 8 mm   In a low-risk patient, CT at 3-6 months, then consider CT at 18-24 months.   In a high-risk patient, CT at 3-6 months, then CT at 18-24 months.      - Low risk patients include individuals with minimal or absent history of   smoking and other known risk factors.      - High risk patients include individuals with a history or smoking or known    risk factors.      Radiology 2017 http://pubs.https://www.stafford-myers.[email or web address]         XR CHEST PORTABLE   Final Result   Borderline cardiac size.  Findings of generalized COPD with appearance of   biapical scarring.  Opacity at the right base laterally which may represent   focal airspace disease.           LABS: All lab results were reviewed by myself, and all abnormals are listed below.  Labs Reviewed   CBC WITH AUTO DIFFERENTIAL - Abnormal; Notable for the following components:       Result Value    WBC 12.0 (*)     RDW 15.4 (*)     Seg Neutrophils 77 (*)     Lymphocytes 12 (*)     Immature Granulocytes 1 (*)     Segs Absolute 9.47 (*)     All other components within normal limits   COMPREHENSIVE METABOLIC PANEL W/ REFLEX TO MG FOR LOW K - Abnormal; Notable for the following components:    BUN 24 (*)     CREATININE 1.02 (*)     Bun/Cre Ratio 24 (*)     Potassium 5.5 (*)     AST 36 (*)     Total Bilirubin 0.14 (*)     GFR Non-African American 52 (*)     All other components within normal limits   TROPONIN - Abnormal; Notable for the following components:    Troponin, High Sensitivity 40 (*)     All other components within normal limits   BRAIN NATRIURETIC PEPTIDE - Abnormal; Notable for the following components:    Pro-BNP 8,642 (*)     All other components within normal limits   D-DIMER, QUANTITATIVE - Abnormal; Notable for the following components:    D-Dimer, Quant 3.12 (*)     All other components within normal limits   FERRITIN - Abnormal; Notable for the following components:    Ferritin 269 (*)     All other components within normal limits   LACTATE DEHYDROGENASE - Abnormal; Notable for the following components:    LD 310 (*)     All other components within normal limits   POC PANEL (G3)-VEN - Abnormal; Notable for the following components:    pO2, Ven 26 (*)     Total CO2, Venous 32 (*)     HCO3, Venous 30.3 (*)     Positive Base Excess, Ven 6 (*)     All other components within normal limits    CULTURE, BLOOD 1   CULTURE, BLOOD 1   COVID-19   LACTIC  ACID   BLOOD GAS, VENOUS   C-REACTIVE PROTEIN       EMERGENCY DEPARTMENTCOURSE:         Vitals:    Vitals:    09/17/18 0833 09/17/18 1050   BP: (!) 149/72    Pulse: 73    Resp: (!) 34 21   SpO2: 97%    Weight: 85 lb (38.6 kg)    Height:  (1.575 m)        The patient was given the following medications while in the emergency department:  Orders Placed This Encounter   Medications   ??? predniSONE (DELTASONE) tablet 50 mg   ??? ipratropium-albuterol (DUONEB) nebulizer solution 1 ampule   ??? 0.9 % sodium chloride bolus   ??? iopamidol (ISOVUE-370) 76 % injection 75 mL   ??? sodium chloride flush 0.9 % injection 10 mL     CONSULTS:  IP CONSULT TO INTERNAL MEDICINE  IP CONSULT TO CARDIOLOGY  IP CONSULT TO PULMONOLOGY    FINAL IMPRESSION      1. COPD exacerbation (HCC)    2. Chronic systolic congestive heart failure (HCC)    3. Shortness of breath    4. Hypoxia          DISPOSITION/PLAN   DISPOSITION Decision To Admit 09/17/2018 10:54:58 AM      PATIENT REFERRED TO:  No follow-up provider specified.  DISCHARGE MEDICATIONS:  New Prescriptions    No medications on file     Ronald Pippins, MD  Attending Emergency Physician                    Lelon Frohlich, MD  09/17/18 1610       Lelon Frohlich, MD  09/17/18 1056

## 2018-09-17 NOTE — Care Coordination-Inpatient (Signed)
Case Management Initial Discharge Plan  Glory Buff,         Readmission Risk              Risk of Unplanned Readmission:        12             Met with:patient to discuss discharge plans.   Information verified: address, contacts, phone number, DOB, insurance Yes  PCP: Paulla Fore, MD  Date of last visit: 3-4 months ago, has appointment next week    Insurance Provider: [facility]    Discharge Planning  Current Residence:   House  Living Arrangements:   Spouse   Home has 1 stories/no stairs to climb  Support Systems:   spouse and children  Current Services PTA:   no Supplier: none  Patient able to perform ADL's:Independent  DME used to aid ambulation prior to admission: 2ww, wheelchair, cane  During admission: 2ww    Potential Assistance Needed:   home care    Pharmacy: [facility] ([facility]) Medications:   no  Does patient want to participate in local refill/ meds to beds program?   no    Patient agreeable to home care: Yes  Freedom of choice provided:  yes      Type of Home Care Services:   sn/pt- [facility]  Patient expects to be discharged to:   home    Prior SNF/Rehab Placement and Facility: no  Agreeable to SNF/Rehab: tbd  Freedom of choice provided: yes  Social Services Evaluation: yes    Expected Discharge date:   09/19/18  Follow Up Appointment: Best Day/ Time:      Transportation provider: family  Transportation arrangements needed for discharge: tbd    Discharge Plan: home with spouse and [facility] vs snf. Patient lives at home independently with spouse in 1 story home with no steps to enter. Patient primarily uses 2ww at home. Patient's son and daughter in law Jenny Reichmann called concerned regarding med management at home. Jenny Reichmann stated she will set out medications but patient will not take meds appropriately. They would like home care RN to come out to set up medications, with the goal of patient listening to outside agency to take meds safely. Patient has follow  up appointment with [facility] next week. Discharge plan tbd.        Electronically signed by Rayfield Citizen, LSW on 09/17/18 at 11:54 AM EDT

## 2018-09-17 NOTE — Plan of Care (Signed)
Problem: Falls - Risk of:  Goal: Will remain free from falls  Description: Will remain free from falls  Outcome: Ongoing  Goal: Absence of physical injury  Description: Absence of physical injury  Outcome: Ongoing     Problem: Pain:  Description: Pain management should include both nonpharmacologic and pharmacologic interventions.  Goal: Pain level will decrease  Description: Pain level will decrease  Outcome: Ongoing  Goal: Control of acute pain  Description: Control of acute pain  Outcome: Ongoing  Goal: Control of chronic pain  Description: Control of chronic pain  Outcome: Ongoing     Problem: Breathing Pattern - Ineffective:  Goal: Ability to achieve and maintain a regular respiratory rate will improve  Description: Ability to achieve and maintain a regular respiratory rate will improve  Outcome: Ongoing

## 2018-09-17 NOTE — Consults (Signed)
Pulmonary Medicine and Critical Care Consult  Orlena Sheldon, MD      Patient - Katherine Cunningham   MRN -  [id number]   Acct # - [id number]  DOB - [birth year]      Date of Admission -  09/17/2018  8:28 AM  Date of evaluation -  09/17/2018  Room - [facility] Day - 0  Consulting - Lelon Frohlich, MD Primary Care Physician - Gwenette Greet, MD     Reason for Consult    Acute on chronic hypoxic respiratory failure/acute exacerbation of COPD    Assessment   ?? Acute on chronic hypoxic respiratory failure requiring BiPAP therapy  ?? Acute exacerbation of COPD/severe emphysema/COVID-19 negative  ?? Pulmonary nodules, sub-centimeter   ?? Elevated d-dimer, CTA negative for pulmonary embolism  ?? AKI/hyperkalemia  ?? CAD, AICD, CHF    Recommendations   ?? IV antibiotics,  Zithromax  ?? IV solu-medrol 40 mg every 6 hours  ?? Albuterol and Ipratropium Q 4 hours and prn  ?? Pulmicort aerosol treatment  ?? Perforomist aerosol treatment  ?? X-ray chest in am  ?? Labs: CBC and BMP in am  ?? Check echo  ?? BiPAP, will reevaluate in the half an hour for need for intubation  ?? 2 liters/min via nasal cannula  ?? DVT prophylaxis with low molecular weight heparin  ?? Will follow with you    Problem List      Patient Active Problem List   Diagnosis   ??? CHF (congestive heart failure), NYHA class II, acute, combined (HCC)       HPI     Katherine Cunningham is 85 y.o.,Caucasian  female, admitted because of acute on chronic hypoxic respiratory failure/COPD exacerbation.  She was brought into the ER for shortness of breath which became more severe yesterday. She denied any fever cough or chest pain.  No abdominal pain, nausea or vomiting. On arrival she was hypoxic requiring 3 L of oxygen via nasal cannula, secondary to her increased work of breathing she was placed on BiPAP.  Her rapid COVID swab was negative. She takes Symbicort and Spiriva at home. She is on oxygen at home at 2 L. She is currently on BiPAP, she is still noted to have increased work of breathing.   Discussed possibility of need for intubation with patient, she is agreeable to intubation if necessary.    PMHx   Past Medical History      Diagnosis Date   ??? Bladder cancer (HCC)     remission   ??? CHF (congestive heart failure) (HCC)     stage 4   ??? COPD (chronic obstructive pulmonary disease) (HCC)    ??? Neck injury     has injections done for disk issue   ??? Thyroid disease       Past Surgical History        Procedure Laterality Date   ??? PACEMAKER PLACEMENT      with defibrillator       Meds    Current Medications   ??? sodium chloride flush  10 mL Intravenous BID   ??? insulin regular  10 Units Intravenous Once    And   ??? dextrose  25 g Intravenous Once   ??? furosemide  40 mg Intravenous Daily   ??? methylPREDNISolone  40 mg Intravenous Q6H     glucose, dextrose, glucagon (rDNA), dextrose, ipratropium-albuterol  IV Drips/Infusions  ??? dextrose       Home Medications  Not in a hospital admission.    Allergies    Aspirin and Penicillins  Social History     Social History     Tobacco Use   ??? Smoking status: Former Smoker     Last attempt to quit: 2005     Years since quitting: 15.3   ??? Smokeless tobacco: Never Used   Substance Use Topics   ??? Alcohol use: Never     Frequency: Never     Family History    History reviewed. No pertinent family history.  ROS - 11 systems   General Denies any fever or chills  HEENT Denies any diplopia, tinnitus or vertigo  Resp positive for  dyspnea  Cardiac Denies any chest pain, palpitations, claudication or edema  GI Denies any melena, hematochezia, hematemesis or pyrosis  GU Denies any frequency, urgency, hesitancy or incontinence  Heme Denies bruising or bleeding easily  Endocrine Denies any history of diabetes or thyroid disease  Neuro Denies any focal motor or sensory deficits  Psychiatric Denies anxiety, depression, suicidal ideation  Skin Denies rashes, itching, open sores  Vitals     height is 5\' 2"  (1.575 m) and weight is 85 lb (38.6 kg). Her axillary temperature is 97.3 ??F (36.3 ??C).  Her blood pressure is 115/63 and her pulse is 81. Her respiration is 26 and oxygen saturation is 89% (abnormal).   Body mass index is 15.55 kg/m??.  I/O    No intake or output data in the 24 hours ending 09/17/18 1138  No intake/output data recorded.   Patient Vitals for the past 96 hrs (Last 3 readings):   Weight   09/17/18 0833 85 lb (38.6 kg)     Exam   General Appearance   Awake, alert, oriented, on BiPAP, increased work of breathing  HEENT - Head is normocephalic, atraumatic. Pupil reactive to light  Neck - Supple, trachea midline and straight  Lungs - positive findings: Decreased air exchange,+ wheezing  Cardiovascular - Heart sounds are normal.  Regular rhythm normal rate without murmur, gallop or rub.  Abdomen - Soft, nontender, nondistended, no masses or organomegaly  Neurologic - CN II-XII are grossly intact. There are no focal motor or sensory deficits  Skin - No bruising or bleeding  Extremities - No cyanosis, clubbing or edema    Labs  - [facility]   CBC     Lab Results   Component Value Date    WBC 12.0 09/17/2018    RBC 4.08 09/17/2018    HGB 12.9 09/17/2018    HCT 41.0 09/17/2018    PLT 235 09/17/2018    MCV 100.5 09/17/2018    MCH 31.6 09/17/2018    MCHC 31.5 09/17/2018    RDW 15.4 09/17/2018    LYMPHOPCT 12 09/17/2018    MONOPCT 8 09/17/2018    BASOPCT 1 09/17/2018    MONOSABS 0.90 09/17/2018    LYMPHSABS 1.41 09/17/2018    EOSABS 0.11 09/17/2018    BASOSABS 0.06 09/17/2018    DIFFTYPE NOT REPORTED 09/17/2018     BMP   Lab Results   Component Value Date    NA 140 09/17/2018    K 5.5 09/17/2018    CL 101 09/17/2018    CO2 25 09/17/2018    BUN 24 09/17/2018    CREATININE 1.02 09/17/2018    GLUCOSE 91 09/17/2018    CALCIUM 10.2 09/17/2018     LFTS  Lab Results   Component  Value Date    ALKPHOS 103 09/17/2018    ALT 22 09/17/2018    AST 36 09/17/2018    PROT 7.5 09/17/2018    BILITOT 0.14 09/17/2018    LABALBU 4.1 09/17/2018     Radiology    CXR       CT  Scans    (See actual reports for details)    "Thank you for asking [place] to see this patient"    Case discussed with nurse and patient.  Questions and concerns addressed.  Electronically signed by     Marlyce Huge, MD on 09/17/2018 at 12:39 PM  Pulmonary Critical Care and Sleep Medicine,  [facility]  Cell: [phone]  Office: [phone]

## 2018-09-17 NOTE — Progress Notes (Signed)
Transitions of Care Pharmacy Service   Medication Review    The patient's list of current home medications has been reviewed and updated. Medication list was confirmed with patients pharmacy. I am unable to interview patient, or determine her use of OTC medications.     Source(s) of information: [facility] ([place], [place])    Please feel free to call with any questions about this encounter. Thank you.    Dorthey Sawyer, [facility]  Transitions of Care Pharmacy Service  Phone:  [phone]  Fax: [phone]            Prior to Admission medications    Medication Sig Start Date End Date Taking? Authorizing Provider   amiodarone (CORDARONE) 200 MG tablet Take 1 tablet by mouth daily   Yes Historical Provider, MD   ferrous sulfate (IRON 325) 325 (65 Fe) MG tablet Take 1 tablet by mouth daily   Yes Historical Provider, MD   ipratropium-albuterol (DUONEB) 0.5-2.5 (3) MG/3ML SOLN nebulizer solution Take 1 nebule by mouth every 8 hours as needed   Yes Historical Provider, MD   levothyroxine (SYNTHROID) 25 MCG tablet Take 1 tablet by mouth daily   Yes Historical Provider, MD   lidocaine (LIDODERM) 5 % Place 1 patch onto the skin as needed 11/17/17  Yes Historical Provider, MD   mirtazapine (REMERON) 15 MG tablet Take 15 mg by mouth nightly  08/31/17  Yes Historical Provider, MD   pramipexole (MIRAPEX) 0.25 MG tablet Take 1 tablet by mouth daily as needed  12/30/17  Yes Historical Provider, MD   budesonide-formoterol (SYMBICORT) 160-4.5 MCG/ACT AERO Take 2 puffs by mouth 2 times daily   Yes Historical Provider, MD   albuterol sulfate HFA (PROAIR HFA) 108 (90 Base) MCG/ACT inhaler Take 2 puffs by mouth every 4 hours as needed   Yes Historical Provider, MD   tiotropium (SPIRIVA RESPIMAT) 1.25 MCG/ACT AERS inhaler Take 2 puffs by mouth daily   Yes Historical Provider, MD   spironolactone (ALDACTONE) 25 MG tablet Take 0.5 tablets by mouth daily   Yes Historical Provider, MD   cyclobenzaprine (FLEXERIL) 10 MG tablet Take 1 tablet by mouth 3  times daily    Historical Provider, MD   silver sulfADIAZINE (SILVADENE) 1 % cream Apply 1 inch topically as needed 01/21/18   Historical Provider, MD   acetaminophen-codeine (TYLENOL/CODEINE #3) 300-30 MG per tablet Take 1 tablet by mouth every 8 hours as needed. 11/17/17   Historical Provider, MD   acetaminophen (TYLENOL) 325 MG suppository Place 2 suppositories rectally as needed  09/17/18  Historical Provider, MD

## 2018-09-18 ENCOUNTER — Inpatient Hospital Stay: Payer: MEDICARE | Primary: Family Medicine

## 2018-09-18 ENCOUNTER — Inpatient Hospital Stay: Admit: 2018-09-18 | Payer: MEDICARE | Primary: Family Medicine

## 2018-09-18 LAB — EKG 12-LEAD
Atrial Rate: 72 {beats}/min
P Axis: 76 degrees
Q-T Interval: 512 ms
QRS Duration: 196 ms
QTc Calculation (Bazett): 560 ms
R Axis: -109 degrees
T Axis: 61 degrees
Ventricular Rate: 72 {beats}/min

## 2018-09-18 LAB — CBC WITH AUTO DIFFERENTIAL
Absolute Eos #: 0 10*3/uL (ref 0.0–0.4)
Absolute Immature Granulocyte: 0.11 10*3/uL (ref 0.00–0.30)
Absolute Lymph #: 0.65 10*3/uL — ABNORMAL LOW (ref 1.0–4.8)
Absolute Mono #: 0.11 10*3/uL — ABNORMAL LOW (ref 0.2–0.8)
Basophils Absolute: 0 10*3/uL (ref 0.0–0.2)
Basophils: 0 %
Eosinophils %: 0 % — ABNORMAL LOW (ref 1–4)
Hematocrit: 39.4 % (ref 36.3–47.1)
Hemoglobin: 12.7 g/dL (ref 11.9–15.1)
Immature Granulocytes: 1 % — ABNORMAL HIGH
Lymphocytes: 6 % — ABNORMAL LOW (ref 24–44)
MCH: 32.5 pg (ref 25.2–33.5)
MCHC: 32.2 g/dL (ref 28.4–34.8)
MCV: 100.8 fL (ref 82.6–102.9)
MPV: 9.7 fL (ref 8.1–13.5)
Monocytes: 1 % (ref 1–7)
NRBC Automated: 0 per 100 WBC
Platelets: 267 10*3/uL (ref 138–453)
RBC: 3.91 m/uL — ABNORMAL LOW (ref 3.95–5.11)
RDW: 15.6 % — ABNORMAL HIGH (ref 11.8–14.4)
Seg Neutrophils: 92 % — ABNORMAL HIGH (ref 36–66)
Segs Absolute: 9.93 10*3/uL — ABNORMAL HIGH (ref 1.8–7.7)
WBC: 10.8 10*3/uL (ref 3.5–11.3)

## 2018-09-18 LAB — BASIC METABOLIC PANEL
Anion Gap: 15 mmol/L (ref 9–17)
BUN: 28 mg/dL — ABNORMAL HIGH (ref 8–23)
Bun/Cre Ratio: 24 — ABNORMAL HIGH (ref 9–20)
CO2: 23 mmol/L (ref 20–31)
Calcium: 9.3 mg/dL (ref 8.6–10.4)
Chloride: 96 mmol/L — ABNORMAL LOW (ref 98–107)
Creatinine: 1.17 mg/dL — ABNORMAL HIGH (ref 0.50–0.90)
GFR African American: 54 mL/min — ABNORMAL LOW (ref >60)
GFR Non-African American: 44 mL/min — ABNORMAL LOW (ref >60)
Glucose: 132 mg/dL — ABNORMAL HIGH (ref 70–99)
Potassium: 5.1 mmol/L (ref 3.7–5.3)
Sodium: 134 mmol/L — ABNORMAL LOW (ref 135–144)

## 2018-09-18 LAB — POC GLUCOSE FINGERSTICK
POC Glucose: 112 mg/dL — ABNORMAL HIGH (ref 65–105)
POC Glucose: 282 mg/dL — ABNORMAL HIGH (ref 65–105)
POC Glucose: 156 mg/dL — ABNORMAL HIGH (ref 65–105)

## 2018-09-18 LAB — BRAIN NATRIURETIC PEPTIDE: Pro-BNP: 14592 pg/mL — ABNORMAL HIGH (ref <300)

## 2018-09-18 LAB — PTH, INTACT: Pth Intact: 47.72 pg/mL (ref 15.0–65.0)

## 2018-09-18 LAB — VITAMIN D 25 HYDROXY: Vit D, 25-Hydroxy: 78.9 ng/mL (ref 30.0–100.0)

## 2018-09-18 LAB — TROPONIN: Troponin, High Sensitivity: 39 ng/L — ABNORMAL HIGH (ref 0–14)

## 2018-09-18 LAB — PHOSPHORUS: Phosphorus: 4.8 mg/dL — ABNORMAL HIGH (ref 2.6–4.5)

## 2018-09-18 LAB — MAGNESIUM: Magnesium: 2.1 mg/dL (ref 1.6–2.6)

## 2018-09-18 MED ORDER — FUROSEMIDE 10 MG/ML IJ SOLN
10 | Freq: Once | INTRAMUSCULAR | Status: AC
Start: 2018-09-18 — End: 2018-09-18
  Administered 2018-09-18: 19:00:00 40 mg via INTRAVENOUS

## 2018-09-18 MED ORDER — ZOLPIDEM TARTRATE 5 MG PO TABS
5 MG | Freq: Every evening | ORAL | Status: DC | PRN
Start: 2018-09-18 — End: 2018-09-25
  Administered 2018-09-18: 07:00:00 5 mg via ORAL

## 2018-09-18 MED FILL — HEPARIN SODIUM (PORCINE) 5000 UNIT/ML IJ SOLN: 5000 UNIT/ML | INTRAMUSCULAR | Qty: 1

## 2018-09-18 MED FILL — IPRATROPIUM-ALBUTEROL 0.5-2.5 (3) MG/3ML IN SOLN: RESPIRATORY_TRACT | Qty: 3

## 2018-09-18 MED FILL — HYDROCODONE-ACETAMINOPHEN 5-325 MG PO TABS: 5-325 MG | ORAL | Qty: 1

## 2018-09-18 MED FILL — MIRTAZAPINE 15 MG PO TABS: 15 MG | ORAL | Qty: 1

## 2018-09-18 MED FILL — FUROSEMIDE 10 MG/ML IJ SOLN: 10 MG/ML | INTRAMUSCULAR | Qty: 4

## 2018-09-18 MED FILL — LEVOTHYROXINE SODIUM 25 MCG PO TABS: 25 MCG | ORAL | Qty: 1

## 2018-09-18 MED FILL — FERROUS SULFATE 325 (65 FE) MG PO TBEC: 325 (65 Fe) MG | ORAL | Qty: 1

## 2018-09-18 MED FILL — SOLU-MEDROL 40 MG IJ SOLR: 40 MG | INTRAMUSCULAR | Qty: 40

## 2018-09-18 MED FILL — ZOLPIDEM TARTRATE 5 MG PO TABS: 5 MG | ORAL | Qty: 1

## 2018-09-18 MED FILL — FAMOTIDINE 20 MG PO TABS: 20 MG | ORAL | Qty: 1

## 2018-09-18 MED FILL — CYCLOBENZAPRINE HCL 10 MG PO TABS: 10 MG | ORAL | Qty: 1

## 2018-09-18 MED FILL — AZITHROMYCIN 500 MG IV SOLR: 500 MG | INTRAVENOUS | Qty: 500

## 2018-09-18 MED FILL — AMIODARONE HCL 200 MG PO TABS: 200 MG | ORAL | Qty: 1

## 2018-09-18 MED FILL — ACETAMINOPHEN 325 MG PO TABS: 325 MG | ORAL | Qty: 2

## 2018-09-18 NOTE — Progress Notes (Signed)
Patient received from ICU. Vitals taken. Assessment completed. No distress noted. See doc flowsheet and transfer navigator for details. POC and education reviewed with patient. Call light within reach, and pt educated on its use. Bed in lowest position, and locked. Side rails up x 2. Denied further questions or needs at this time. Will continue to monitor.

## 2018-09-18 NOTE — Progress Notes (Signed)
Progress note  [facility].,    Adult Hospitalist      Name: Katherine Cunningham  MRN: [id number]     Acct: [id number]  Room: 1122334455    Admit Date: 09/17/2018  8:28 AM  PCP: Gwenette Greet, MD    Primary Problem  Active Problems:    CHF (congestive heart failure), NYHA class II, acute, combined (HCC)  Resolved Problems:    * No resolved hospital problems. *        Assesment:     ?? Acute combined systolic and diastolic CHF  ?? Elevated troponin  ?? Acute COPD exacerbation  ?? Acute respiratory insufficiency  ?? Right lung base pulmonary nodule  ?? Hyperkalemia  ?? Acute renal insufficiency  ?? Hypothyroidism  ?? Cardiomyopathy status post AICD  ?? History of bladder cancer        Plan:     ?? Patient is currently in ICU  ?? O2 to maintain oxygen saturation greater than 92%  ?? BiPAP PRN  ?? Serial troponin elevated  ?? Echocardiogram shows EF 25% and diastolic dysfunction  ?? IV Zithromax  ?? IV fluids, off Lasix  ?? IV Solu-Medrol  ?? Duo nebs  ?? Monitor BMP twice daily  ?? Holding spironolactone   ?? Monitor creatinine  ?? Continue amiodarone  ?? Continue Synthroid, Remeron, Mirapex  ?? Cardiology consult  ?? Pulmonology consult, input noted  ?? Nephrology consult, input noted  ?? DVT and GI prophylaxis.    Patient can be transferred to progressive floor    Chief Complaint:     Chief Complaint   Patient presents with   ??? Shortness of Breath     unable to crae for self at home         History of Present Illness:      Patient seen and examined at bedside.  Patient is short of breath, she required BiPAP yesterday.  However overnight she was doing well on O2 via nasal cannula  potassium improved  No acute events overnight   afebrile   Denies any  headache, dizziness, cough, cold, changes in urination or bowel habits    HPI:    Katherine Cunningham is a 85 y.o.  female who presents with Shortness of Breath (unable to crae for self at home)    85 year old lady with past medical history of CHF, coronary disease status post AICD, COPD,  hypothyroidism presented to ER complaining of shortness of breath.  Patient lives at home with her husband who has dementia.  Patient noticed to have progressive shortness of breath with worsening in the past 24 hours.  Patient denies any fever, chills, cough, nausea, vomiting, change urination, bowel habit.  Patient uses 2 L home O2 at night.  On presentation to ER sheWas tachypneic with respiratory rate in 34 and was requiring 2 to 4 L to maintain her oxygen saturation.  There is a concern by paramedics that patient is probably not able to take care of herself at home.On presentation her BNP was 8642, her troponin was 40, her potassium was 5.5, creatinine was 1.02.  Her LDH was 310, ferritin was 269, d-dimer was 3.12.  Her WBC was 12,000.  Her COVID test was negative.  She has a CTA chest which was negative for PE but shows severe emphysematous changes, pulmonary nodules at the right lung base, bronchiectasis..  Patient admitted with cardiology and pulmonology consult for further management.  I have personally reviewed the past medical history, past surgical history, medications,  social history, and family history, and summarized in the note.    Review of Systems:     All 10 point system is reviewed and negative otherwise mentioned in HPI.      Past Medical History:     Past Medical History:   Diagnosis Date   ??? Bladder cancer (HCC)     remission   ??? CHF (congestive heart failure) (HCC)     stage 4   ??? COPD (chronic obstructive pulmonary disease) (HCC)    ??? Neck injury     has injections done for disk issue   ??? Thyroid disease         Past Surgical History:     Past Surgical History:   Procedure Laterality Date   ??? PACEMAKER PLACEMENT      with defibrillator        Medications Prior to Admission:       Prior to Admission medications    Medication Sig Start Date End Date Taking? Authorizing Provider   amiodarone (CORDARONE) 200 MG tablet Take 1 tablet by mouth daily   Yes Historical Provider, MD   ferrous sulfate  (IRON 325) 325 (65 Fe) MG tablet Take 1 tablet by mouth daily   Yes Historical Provider, MD   ipratropium-albuterol (DUONEB) 0.5-2.5 (3) MG/3ML SOLN nebulizer solution Take 1 nebule by mouth every 8 hours as needed   Yes Historical Provider, MD   levothyroxine (SYNTHROID) 25 MCG tablet Take 1 tablet by mouth daily   Yes Historical Provider, MD   lidocaine (LIDODERM) 5 % Place 1 patch onto the skin as needed 11/17/17  Yes Historical Provider, MD   mirtazapine (REMERON) 15 MG tablet Take 15 mg by mouth nightly  08/31/17  Yes Historical Provider, MD   pramipexole (MIRAPEX) 0.25 MG tablet Take 1 tablet by mouth daily as needed  12/30/17  Yes Historical Provider, MD   budesonide-formoterol (SYMBICORT) 160-4.5 MCG/ACT AERO Take 2 puffs by mouth 2 times daily   Yes Historical Provider, MD   albuterol sulfate HFA (PROAIR HFA) 108 (90 Base) MCG/ACT inhaler Take 2 puffs by mouth every 4 hours as needed   Yes Historical Provider, MD   tiotropium (SPIRIVA RESPIMAT) 1.25 MCG/ACT AERS inhaler Take 2 puffs by mouth daily   Yes Historical Provider, MD   spironolactone (ALDACTONE) 25 MG tablet Take 0.5 tablets by mouth daily   Yes Historical Provider, MD   cyclobenzaprine (FLEXERIL) 10 MG tablet Take 1 tablet by mouth 3 times daily    Historical Provider, MD   silver sulfADIAZINE (SILVADENE) 1 % cream Apply 1 inch topically as needed 01/21/18   Historical Provider, MD   acetaminophen-codeine (TYLENOL/CODEINE #3) 300-30 MG per tablet Take 1 tablet by mouth every 8 hours as needed. 11/17/17   Historical Provider, MD        Allergies:       Aspirin and Penicillins    Social History:     Tobacco:    reports that she quit smoking about 15 years ago. She has never used smokeless tobacco.  Alcohol:      reports no history of alcohol use.  Drug Use:  reports no history of drug use.    Family History:     History reviewed. No pertinent family history.      Physical Exam:     Vitals:  BP 127/81    Pulse 89    Temp 98.2 ??F (36.8 ??C) (Oral)    Resp 22  Ht  (1.575 m)    Wt 85 lb (38.6 kg)    SpO2 97%    BMI 15.55 kg/m??   Temp (24hrs), Avg:98.1 ??F (36.7 ??C), Min:97.9 ??F (36.6 ??C), Max:98.2 ??F (36.8 ??C)          General appearance - alert, frail, and in no moderate distress  Mental status - oriented to person, place, and time with normal affect  Head - normocephalic and atraumatic  Eyes - pupils equal and reactive, extraocular eye movements intact, conjunctiva clear  Ears - hearing appears to be intact  Nose - no drainage noted  Mouth - mucous membranes moist  Neck - supple, no carotid bruits, thyroid not palpable  Chest -basal crackles, wheezing, increased effort  Heart - normal rate, regular rhythm, no murmur  Abdomen - soft, nontender, nondistended, bowel sounds present all four quadrants, no masses, hepatomegaly or splenomegaly  Neurological - normal speech, no focal findings or movement disorder noted, cranial nerves II through XII grossly intact  Extremities -2+ peripheral edema  Skin - no gross lesions, rashes, or induration noted        Data:     Labs:    Hematology:  Recent Labs     09/17/18  0851 09/18/18  0150   WBC 12.0* 10.8   RBC 4.08 3.91*   HGB 12.9 12.7   HCT 41.0 39.4   MCV 100.5 100.8   MCH 31.6 32.5   MCHC 31.5 32.2   RDW 15.4* 15.6*   PLT 235 267   MPV 9.4 9.7   CRP 13.0*  --    DDIMER 3.12*  --      Chemistry:  Recent Labs     09/17/18  0851 09/17/18  1145 09/17/18  1445 09/17/18  1921 09/18/18  0150   NA 140  --  135  --  134*   K 5.5*  --  4.9  --  5.1   CL 101  --  99  --  96*   CO2 25  --  21  --  23   GLUCOSE 91  --  79  --  132*   BUN 24*  --  25*  --  28*   CREATININE 1.02*  --  0.96*  --  1.17*   MG  --   --   --   --  2.1   ANIONGAP 14  --  15  --  15   LABGLOM 52*  --  56*  --  44*   GFRAA >60  --  >60  --  54*   CALCIUM 10.2  --  9.8  --  9.3   PHOS  --   --   --   --  4.8*   PROBNP 8,642*  --   --   --  14,592*   TROPHS 40* 43*  --  40* 39*     Recent Labs     09/17/18  0851 09/18/18  0150 09/18/18  0909   PROT 7.5 6.9  --     LABALBU 4.1  --   --    AST 36*  --   --    ALT 22  --   --    LDH 310*  --   --    ALKPHOS 103  --   --    BILITOT 0.14*  --   --    POCGLU  --   --  112*  No results found for: INR, PROTIME    Lab Results   Component Value Date/Time    SPECIAL 12ML RAC 09/17/2018 09:26 AM     Lab Results   Component Value Date/Time    CULTURE NO GROWTH 23 HOURS 09/17/2018 09:26 AM       Lab Results   Component Value Date    POCPH 7.39 09/17/2018    POCPCO2 43 09/17/2018    POCPO2 81 09/17/2018    POCHCO3 25.9 09/17/2018    NBEA NOT REPORTED 09/17/2018    PBEA 1 09/17/2018    TCO2ART 27 09/17/2018    POCO2SAT 96 09/17/2018    FIO2 30.0 09/17/2018       Radiology:    Xr Chest Portable    Result Date: 09/17/2018  Borderline cardiac size.  Findings of generalized COPD with appearance of biapical scarring.  Opacity at the right base laterally which may represent focal airspace disease.     Ct Chest Pulmonary Embolism W Contrast    Result Date: 09/17/2018  1.  No evidence of pulmonary embolus. 2.  Severe emphysematous changes in the lungs. 3.  Pulmonary nodules at the right lung base. 4.  Bronchiectasis.  No focal consolidation or effusion. 5.  Atherosclerotic disease. RECOMMENDATIONS: Fleischner Society guidelines for follow-up and management of incidentally detected pulmonary nodules: Multiple Solid Nodules: Nodule size greater than 8 mm In a low-risk patient, CT at 3-6 months, then consider CT at 18-24 months. In a high-risk patient, CT at 3-6 months, then CT at 18-24 months. - Low risk patients include individuals with minimal or absent history of smoking and other known risk factors. - High risk patients include individuals with a history or smoking or known risk factors. Radiology 2017 http://pubs.https://www.stafford-myers.com/rsna.org/doi/full/10.1148/radiol.1610960454579 521 7453         All radiological studies reviewed                Code Status:  Full Code    Electronically signed by Phineas InchesSaudia Aina Rossbach, MD on 09/18/2018 at 10:36 AM     Copy sent to Dr. Gwenette Greetalla, Rekha,  MD    This note was created with the assistance of a speech-recognition program.  Although the intention is to generate a document that actually reflects the content of the visit, no guarantees can be provided that every mistake has been identified and corrected by editing.     Note was updated later by me after  physical examination and  completion of the assessment.

## 2018-09-18 NOTE — Plan of Care (Signed)
Problem: Falls - Risk of:  Goal: Will remain free from falls  Description: Will remain free from falls  Outcome: Met This Shift  Note: The patient remained free from falls this shift, call light within reach, bed in locked and lowest position.  Side rails up x2.  Continue to monitor closely.      Problem: Pain:  Goal: Pain level will decrease  Description: Pain level will decrease  Outcome: Met This Shift  Note: Patient's pain has been well controlled throughout the entire shift, please see MAR.      Problem: Breathing Pattern - Ineffective:  Goal: Ability to achieve and maintain a regular respiratory rate will improve  Description: Ability to achieve and maintain a regular respiratory rate will improve  Outcome: Ongoing  Note: The patient remains on 2L nasal cannula at this time, which is what she wears at home. See doc flowsheet for O2 saturation.      Problem: Respiratory:  Goal: Respiratory status will improve  Description: Respiratory status will improve  Outcome: Ongoing  Note: The patient states that her breathing has improved with the use of lasix in the hospital.      Problem: Skin Integrity:  Goal: Will show no infection signs and symptoms  Description: Will show no infection signs and symptoms  Outcome: Met This Shift  Note: The patient's skin remains dry and intact.  Assist patient with turning Q2 hours and PRN.

## 2018-09-18 NOTE — Consults (Signed)
[facility]                [address] [place], [place] [address]                                  CONSULTATION    PATIENT NAME: Katherine Cunningham, Katherine Cunningham                     DOB:        21-Sep-1932  MED REC NO:   [id number]                             ROOM:       1108  ACCOUNT NO:   [id number]                           ADMIT DATE: 09/17/2018  PROVIDER:     Raynelle Dick    CONSULT DATE:  09/18/2018    CARDIOLOGY CONSULTATION    REASON FOR CARDIOLOGY CONSULTATION:  Congestive heart failure.    CHIEF COMPLAINT:  Shortness of breath.    HISTORY OF PRESENT ILLNESS:  The patient is an 85 year old female with  severe advanced COPD, dependent on supplemental oxygen, history of  dilated cardiomyopathy, history of defibrillator implantation, was  admitted with progressive shortness of breath which was worsening over  the last 24 hours.  Cardiology Service was consulted in view of CHF.   She had a COVID-19 test done which was negative.  She has a CTA of the  chest which was negative for pulmonary embolism but showed severe  emphysematous changes and pulmonary nodules at the right lung base.    At the time of my examination, the patient is somewhat anxious at  bedrest.  Denies any symptoms of chest pain or shortness of breath.    PAST MEDICAL HISTORY:  1.  Bladder cancer.  2.  Stage IV congestive heart failure.  3.  COPD.  4.  Neck injury.  5.  Thyroid disease.    PAST SURGICAL HISTORY:  Positive for defibrillator implantation for  dilated cardiomyopathy.    ALLERGIES:  To ASPIRIN and PENICILLIN class drugs.    SOCIAL HISTORY:  The patient had longstanding history of smoking for  over 50 years which she quit 15 years ago.  She smoked approximately one  pack of cigarettes a day.  Alcohol:  Denies use of illicit drugs or  alcohol.    FAMILY HISTORY:  Reviewed and is negative for sudden cardiac death or  significant coronary disease.    REVIEW OF SYSTEMS:  A 15-point system review was  performed and pertinent  findings are as noted.  CONSTITUTIONAL:  Positive for fatigue.  HEENT:   No ear discharge, nasal discharge.  CARDIOVASCULAR:  Positive for  shortness of breath with exertion.  RESPIRATORY:  Positive for shortness  of breath with exertion.  CARDIOVASCULAR:  Also positive for history of  defibrillator implantation for dilated cardiomyopathy.   GASTROINTESTINAL:  No nausea, vomiting, diarrhea or frequency.   GENITOURINARY:  No dysuria, frequency, hematuria.  INTEGUMENT:  No skin  rashes or easy bruising.  PSYCHIATRIC:  No history of anxiety or  depression.    PHYSICAL EXAMINATION:  GENERAL:  Currently demonstrates the patient comfortable at bedrest.  VITAL SIGNS:  Blood pressure 130/60, pulse  rate of 80, temperature 97.3  degrees Fahrenheit, respiratory rate of 24, height is 5 feet 2 inches.   Weight is 85 pounds.  SpO2 98%.  GENERAL APPEARANCE:  The patient is awake, frail, and appears to be in  mild distress at bedrest.  HEENT:  Cranium is atraumatic, normocephalic.  There is no ear  discharge.  No nasal discharge.  Oral mucosa appears moist.  NECK:  Examination of the neck reveals no jugular venous distention.   Normal carotid upstrokes.  No bruits.  HEART:  Examination of the heart reveals S1, S2.  There is a grade 1 to  2/6 systolic murmur at right sternal border.  LUNGS:  Revealed scattered expiratory wheezes bilaterally.  ABDOMEN:  Soft.  Bowel sounds are present.  EXTREMITIES:  Demonstrated 2+ bilateral edema.  Symmetric distal pulses  are noted which are equal in volume bilaterally.    LABORATORY DATA:  WBC count of 12, hemoglobin of 12.9, hematocrit of  41.0.  Sodium 140, potassium 5.5, creatinine 1.02.  Troponin is 40.    IMPRESSION:  1.  Acute-on-chronic systolic congestive heart failure.  2.  History of defibrillator implantation for severe dilated  cardiomyopathy.  3.  Severe advanced COPD.    PLAN:  Continue current diuretic regimen for decompensated congestive  heart failure.   The patient has had history of paroxysmal atrial  fibrillation with rapid ventricular response which has been under fair  control on amiodarone 200 mg daily.    The patient's overall prognosis is very poor due to class IV chronic  systolic congestive heart failure and advanced COPD.  I recommend  palliative care.        Raynelle DickKESARI B Kollen Armenti    D: 09/18/2018 13:04:04       T: 09/18/2018 13:15:45     [email or web address]  Job#: [id number]     Doc#: [id number]    CC:

## 2018-09-18 NOTE — Progress Notes (Signed)
Patient's family brought in one hearing aid which the patient placed in her right ear at this time.

## 2018-09-18 NOTE — Progress Notes (Signed)
Physical Therapy    Facility/Department: [facility]  Initial Assessment    NAME: Fontaine NoDorothy Leiber  DOB: 1933/04/16  MRN: [id number]    Date of Service: 09/18/2018    Discharge Recommendations:  Continue to assess pending progress, Subacute/Skilled Nursing Facility        Assessment   Body structures, Functions, Activity limitations: Decreased functional mobility ;Decreased safe awareness;Decreased endurance;Decreased balance;Decreased strength  Assessment: Pt presents with mild SOB with O2 sats maintained.  Pt w/ increased function 2nd time OOB compaired to first.  Pt safe for transfer to progressive per MD.   Will progress as tolerated but for today still recommned SNF.    Specific instructions for Next Treatment: gait training w/ O2;   Prognosis: Good  Decision Making: Medium Complexity  Clinical Presentation: evolving  PT Education: Goals;Plan of Care;Transfer Training;General Safety;Disease Specific Education;PT Role  REQUIRES PT FOLLOW UP: Yes  Activity Tolerance  Activity Tolerance: Patient Tolerated treatment well       Patient Diagnosis(es): The primary encounter diagnosis was COPD exacerbation (HCC). Diagnoses of Chronic systolic congestive heart failure (HCC), Shortness of breath, and Hypoxia were also pertinent to this visit.     has a past medical history of Bladder cancer (HCC), CHF (congestive heart failure) (HCC), COPD (chronic obstructive pulmonary disease) (HCC), Neck injury, and Thyroid disease.   has a past surgical history that includes pacemaker placement.    Restrictions  Restrictions/Precautions  Restrictions/Precautions: Up as Tolerated, General Precautions, Fall Risk  Position Activity Restriction  Other position/activity restrictions: O2 via n.c.; wall suction urinary cath   Vision/Hearing  Vision: Impaired  Vision Exceptions: Wears glasses at all times  H[facility]: [facility]  Hearing Exceptions: Hard of hearing/hearing concerns;Bilateral hearing aid     Subjective  General  Chart Reviewed:  Yes  Patient assessed for rehabilitation services?: Yes  Response To Previous Treatment: Not applicable  Family / Caregiver Present: No  Follows Commands: Within Functional Limits  Subjective  Subjective: Pt reports she is anxious to get back home.   Pain Screening  Patient Currently in Pain: Yes    Orientation  Orientation  Overall Orientation Status: Impaired  Social/Functional History  Social/Functional History  Lives With: Spouse  Type of Home: House  Home Layout: One level  Bathroom Shower/Tub: Tub/Shower unit  Home Equipment: Medical laboratory scientific officerCane  ADL Assistance: Independent  Homemaking Assistance: Needs assistance(family brings food, has cleaning lady. pt can do simple tasks)  Ambulation Assistance: Independent(no AD)  Transfer Assistance: Independent  Active Driver: Yes  Mode of Transportation: Car  Additional Comments: pt is a questionable historian this date.   Cognition - mild confusion       Objective     Observation/Palpation  Posture: Fair  Observation: frail; talkative. monitor O2 sats.     AROM RLE (degrees)  RLE AROM: WFL  AROM LLE (degrees)  LLE AROM : WFL  AROM RUE (degrees)  RUE AROM : WFL  AROM LUE (degrees)  LUE AROM : WFL  Strength RLE  Comment: 3+/5 grossly   Strength LLE  Comment: 3+/5 grossly      Sensation  Overall Sensation Status: WFL  Bed mobility  Bridging: Independent  Rolling to Left: Modified independent  Rolling to Right: Modified independent  Supine to Sit: Contact guard assistance  Sit to Supine: Contact guard assistance  Scooting: Contact guard assistance  Transfers  Sit to Stand: Contact guard assistance  Stand to sit: Contact guard assistance  Bed to Chair: Contact guard assistance  Ambulation  Ambulation?:  Yes  Ambulation 1  Device: Rolling Walker  Assistance: Contact guard assistance  Quality of Gait: gait steady; distance limited by O2 line  Gait Deviations: Increased BOS;Shuffles  Distance: 7 ft x 1;     Comments: standing closed chain exercises post gait -> x 10 reps heel lifts,  marching.  ~ 5 minutes of standing exercises and static balance tolerance.      Balance  Posture: Good  Sitting - Static: Good  Sitting - Dynamic: Good  Standing - Static: Good;-  Standing - Dynamic: Fair;+  Exercises  Comments: CKC x 10 reps; bridging x 10 reps; deep breathing x 10 reps.       Plan   Plan  Times per week: daily.  5-6x/week   Specific instructions for Next Treatment: gait training w/ O2;   Current Treatment Recommendations: Strengthening, Location manager, Teacher, early years/pre, Teaching laboratory technician, Investment banker, operational, Home Exercise Program, Equities trader, Patent attorney, Museum/gallery curator, Psychologist, educational, Ecologist Devices  Type of devices: All fall risk precautions in place, Positioning belt, Left in bed, Call light within reach    AM-PAC Score 17/24     Goals  Short term goals  Time Frame for Short term goals: 12 tx's  Short term goal 1: Bed mobility independnet  Short term goal 2: Transfers independent  Short term goal 3: Amb x 100 ft w/ O2 via n.c. independent w/ rwalker  Short term goal 4: Pt issued HEP for safe home program.   Patient Goals   Patient goals : Pt goal is to get home asap     Therapy Time   Individual Concurrent Group Co-treatment   Time In 1053         Time Out 1127         Minutes 34             Treatment time: 24 minutes.     Denton Ar, PT

## 2018-09-18 NOTE — Progress Notes (Signed)
Pulmonary Critical Care Progress Note    Patient seen for the follow up of <principal problem not specified>     Subjective:    She denies chest pain. Mild occasional cough, mostly dry. Shortness of breath is improved.  She is on oxygen at 2 L nasal cannula.  She has oxygen at home at night only.  She has quit smoking years ago.  She has tolerated oral intake.    Examination:    Vitals: BP 127/81    Pulse 89    Temp 98.2 ??F (36.8 ??C) (Oral)    Resp 22    Ht 5\' 2"  (1.575 m)    Wt 85 lb (38.6 kg)    SpO2 97%    BMI 15.55 kg/m??   SpO2  Avg: 97.3 %  Min: 95 %  Max: 100 %  General appearance: alert and cooperative with exam  Neck: No JVD  Lungs: Decreased breath sounds no crackles  Heart: regular rate and rhythm, S1, S2 normal, no gallop  Abdomen: Soft, non tender, + BS  Extremities: no cyanosis or clubbing. No significant edema    LABs:    CBC:   Recent Labs     09/17/18  0851 09/18/18  0150   WBC 12.0* 10.8   HGB 12.9 12.7   HCT 41.0 39.4   PLT 235 267     BMP:   Recent Labs     09/17/18  1445 09/18/18  0150   NA 135 134*   K 4.9 5.1   CO2 21 23   BUN 25* 28*   CREATININE 0.96* 1.17*   LABGLOM 56* 44*   GLUCOSE 79 132*     LIVER PROFILE:  Recent Labs     09/17/18  0851   AST 36*   ALT 22   LABALBU 4.1       Radiology:  Chest x-ray 5/23  COPD/emphysema. ??No acute process is seen. ??No significant change from the May 22nd study            Echocardiogram  Technically difficult study due to patient size and position.  Left ventricle is normal in size  Global left ventricular systolic function is severely reduced  Estimated ejection fraction is 25 % .  Evidence of diastolic dysfunction.  Trivial tricuspid regurgitation.  No pulmonary hypertension.  No pericardial effusion seen.  Normal aortic root dimension    Impression:  ?? Acute on chronic hypoxic respiratory failure requiring BiPAP therapy  ?? Acute exacerbation of COPD/severe emphysema/COVID-19 negative  ?? Pulmonary nodules, sub-centimeter   ?? Elevated d-dimer, CTA  negative for pulmonary embolism  ?? AKI/hyperkalemia  ?? CAD, AICD, CHF    Recommendations:  ??  oxygen by nasal cannula  ?? BiPAP support PRN  ?? Incentive spirometry every hour while awake  ?? DuoNeb by nebulizer  ?? Pulmicort every 12 hours by nebs  ?? Performance by nebulizer every 12 hours  ??  IV antibiotics,  Zithromax  ?? IV solu-medrol 40 mg every 6 hours  ?? Discontinue IV fluids  ?? Lower extremity venous Dopplers  ?? X-ray chest in am  ?? Cardiology evaluation  ?? Bedside physical therapy  ?? Okay for stepdown  ?? DVT prophylaxis with low molecular weight heparin    Ardelle Anton, MD, [facility]  Pulmonary Critical Care and Sleep Medicine,  [facility]  Cell: [phone]  Office: [phone]

## 2018-09-18 NOTE — Progress Notes (Signed)
Reason for Consult:  Acute kidney injury.    Interval Hx:    Cr mildly elevated  Afebrile  BP OK  Dyspnea stable  No CP  UOP 800  BNP elevated      HISTORY OF PRESENT ILLNESS:    The patient is a 85 y.o. female who presents with shortness of breath. She had iv contrast with CTA chest today. No previous labs available in our system for comparison.. On admission she was noted to have elevated creatinine of 1.02 with a potassium level of 5.5. Presently on BiPAP. Denies any problems with nausea, vomiting, appetite, diarrhea or difficulty with urination. Denies any recent use of NSAIDs.    Review Of Systems:   Constitutional: No fever, chills, lethargy, weakness or wt loss.  HEENT:  No headache, nasal discharge or sore throat.  Cardiac:  No chest pain, dyspnea, orthopnea or PND.  Chest:              No cough, phlegm or wheezing.  Abdomen:  No abdominal pain, nausea, vomiting or diarrhea.  Neuro:   No gross focal weakness, numbness, abnormal movements or seizure like activity.  Skin:   No rashes or itching.  GU:   No hematuria, pyuria, dysuria or flank pain.  Extremities:  No swelling or joint pains.  Endocrine: No polyuria, polydypsia, or thyroid problems.  Hematology:    No bleeding disorders, bruising or anemia.  All other ROS is negative.      Past Medical History:   Diagnosis Date   ??? Bladder cancer (HCC)     remission   ??? CHF (congestive heart failure) (HCC)     stage 4   ??? COPD (chronic obstructive pulmonary disease) (HCC)    ??? Neck injury     has injections done for disk issue   ??? Thyroid disease        Past Surgical History:   Procedure Laterality Date   ??? PACEMAKER PLACEMENT      with defibrillator       Prior to Admission medications    Medication Sig Start Date End Date Taking? Authorizing Provider   amiodarone (CORDARONE) 200 MG tablet Take 1 tablet by mouth daily   Yes Historical Provider, MD   ferrous sulfate (IRON 325) 325 (65 Fe) MG tablet Take 1 tablet by mouth daily   Yes Historical Provider, MD    ipratropium-albuterol (DUONEB) 0.5-2.5 (3) MG/3ML SOLN nebulizer solution Take 1 nebule by mouth every 8 hours as needed   Yes Historical Provider, MD   levothyroxine (SYNTHROID) 25 MCG tablet Take 1 tablet by mouth daily   Yes Historical Provider, MD   lidocaine (LIDODERM) 5 % Place 1 patch onto the skin as needed 11/17/17  Yes Historical Provider, MD   mirtazapine (REMERON) 15 MG tablet Take 15 mg by mouth nightly  08/31/17  Yes Historical Provider, MD   pramipexole (MIRAPEX) 0.25 MG tablet Take 1 tablet by mouth daily as needed  12/30/17  Yes Historical Provider, MD   budesonide-formoterol (SYMBICORT) 160-4.5 MCG/ACT AERO Take 2 puffs by mouth 2 times daily   Yes Historical Provider, MD   albuterol sulfate HFA (PROAIR HFA) 108 (90 Base) MCG/ACT inhaler Take 2 puffs by mouth every 4 hours as needed   Yes Historical Provider, MD   tiotropium (SPIRIVA RESPIMAT) 1.25 MCG/ACT AERS inhaler Take 2 puffs by mouth daily   Yes Historical Provider, MD   spironolactone (ALDACTONE) 25 MG tablet Take 0.5 tablets by mouth daily  Yes Historical Provider, MD   cyclobenzaprine (FLEXERIL) 10 MG tablet Take 1 tablet by mouth 3 times daily    Historical Provider, MD   silver sulfADIAZINE (SILVADENE) 1 % cream Apply 1 inch topically as needed 01/21/18   Historical Provider, MD   acetaminophen-codeine (TYLENOL/CODEINE #3) 300-30 MG per tablet Take 1 tablet by mouth every 8 hours as needed. 11/17/17   Historical Provider, MD       Scheduled Meds:  ??? furosemide  40 mg Intravenous Once   ??? sodium chloride flush  10 mL Intravenous BID   ??? sodium chloride flush  10 mL Intravenous 2 times per day   ??? amiodarone  200 mg Oral Daily   ??? cyclobenzaprine  10 mg Oral TID   ??? ferrous sulfate  325 mg Oral Daily   ??? levothyroxine  25 mcg Oral Daily   ??? mirtazapine  15 mg Oral Nightly   ??? budesonide-formoterol  2 puff Inhalation BID   ??? famotidine  20 mg Oral Daily   ??? heparin (porcine)  5,000 Units Subcutaneous BID   ??? methylPREDNISolone  40 mg  Intravenous Q6H   ??? azithromycin  500 mg Intravenous Q24H   ??? ipratropium-albuterol  1 ampule Inhalation Q4H     Continuous Infusions:  ??? sodium chloride 75 mL/hr at 09/17/18 1503   ??? dextrose       PRN Meds:zolpidem, sodium chloride flush, ipratropium-albuterol, pramipexole, HYDROcodone 5 mg - acetaminophen, acetaminophen **OR** acetaminophen, senna, promethazine **OR** ondansetron, glucose, dextrose, glucagon (rDNA), dextrose    Allergies   Allergen Reactions   ??? Aspirin Other (See Comments)   ??? Penicillins Rash       Social History     Socioeconomic History   ??? Marital status: Married     Spouse name: Not on file   ??? Number of children: Not on file   ??? Years of education: Not on file   ??? Highest education level: Not on file   Occupational History   ??? Not on file   Social Needs   ??? Financial resource strain: Not on file   ??? Food insecurity     Worry: Not on file     Inability: Not on file   ??? Transportation needs     Medical: Not on file     Non-medical: Not on file   Tobacco Use   ??? Smoking status: Former Smoker     Last attempt to quit: 2005     Years since quitting: 15.4   ??? Smokeless tobacco: Never Used   Substance and Sexual Activity   ??? Alcohol use: Never     Frequency: Never   ??? Drug use: Never   ??? Sexual activity: Not on file   Lifestyle   ??? Physical activity     Days per week: Not on file     Minutes per session: Not on file   ??? Stress: Not on file   Relationships   ??? Social Wellsite geologistconnections     Talks on phone: Not on file     Gets together: Not on file     Attends religious service: Not on file     Active member of club or organization: Not on file     Attends meetings of clubs or organizations: Not on file     Relationship status: Not on file   ??? Intimate partner violence     Fear of current or ex partner: Not on file  Emotionally abused: Not on file     Physically abused: Not on file     Forced sexual activity: Not on file   Other Topics Concern   ??? Not on file   Social History Narrative   ??? Not on file        History reviewed. No pertinent family history.      Physical Exam:  Vitals:    09/18/18 0700 09/18/18 0800 09/18/18 0811 09/18/18 1000   BP: 129/63 (!) 122/93  127/81   Pulse: 76 78  89   Resp: 17 18 27 22    Temp:  98.2 ??F (36.8 ??C)     TempSrc:  Oral     SpO2:  97% 97%    Weight:       Height:         I/O last 3 completed shifts:  In: 1543 [P.O.:300; I.V.:993; IV Piggyback:250]  Out: 800 [Urine:800]    General:  Awake, alert, not in distress. Appears to be stated age.  HEENT: Atraumatic, normocephalic. Anicteric sclera. Pink and moist oral mucosa. No carotid bruit. No JVD.  Chest: Bilateral air entry, clear to auscultation, no wheezing, rhonchi or rales.  Cardiovascular: RRR, S1S2, no murmur, rub or gallop. No lower extremity edema.    Abdomen: Soft, non tender to palpation. Active bowel sounds x 4 quadrants.  Musculoskeletal: Active ROM x 4 extremities. No cyanosis or clubbing.  Integumentary: Pink, warm and dry. Free from rash or lesions. Skin turgor normal.  CNS: Speech clear. Face symmetrical. No tremor.     Data:  CBC:   Lab Results   Component Value Date    WBC 10.8 09/18/2018    HGB 12.7 09/18/2018    HCT 39.4 09/18/2018    MCV 100.8 09/18/2018    PLT 267 09/18/2018     BMP:    Lab Results   Component Value Date    NA 134 (L) 09/18/2018    NA 135 09/17/2018    NA 140 09/17/2018    K 5.1 09/18/2018    K 4.9 09/17/2018    K 5.5 (H) 09/17/2018    CL 96 (L) 09/18/2018    CL 99 09/17/2018    CL 101 09/17/2018    CO2 23 09/18/2018    CO2 21 09/17/2018    CO2 25 09/17/2018    BUN 28 (H) 09/18/2018    BUN 25 (H) 09/17/2018    BUN 24 (H) 09/17/2018    CREATININE 1.17 (H) 09/18/2018    CREATININE 0.96 (H) 09/17/2018    CREATININE 1.02 (H) 09/17/2018    GLUCOSE 132 (H) 09/18/2018    GLUCOSE 79 09/17/2018    GLUCOSE 91 09/17/2018     CMP:   Lab Results   Component Value Date    NA 134 09/18/2018    K 5.1 09/18/2018    CL 96 09/18/2018    CO2 23 09/18/2018    BUN 28 09/18/2018    CREATININE 1.17 09/18/2018     GLUCOSE 132 09/18/2018    CALCIUM 9.3 09/18/2018    PROT 6.9 09/18/2018    LABALBU 4.1 09/17/2018    BILITOT 0.14 09/17/2018    ALKPHOS 103 09/17/2018    AST 36 09/17/2018    ALT 22 09/17/2018      Hepatic:   Lab Results   Component Value Date    AST 36 (H) 09/17/2018    ALT 22 09/17/2018    BILITOT 0.14 (L) 09/17/2018    ALKPHOS 103  09/17/2018     BNP: No results found for: BNP  Lipids: No results found for: CHOL, HDL  INR: No results found for: INR  PTH: No results found for: PTH  Phosphorus:    Lab Results   Component Value Date    PHOS 4.8 09/18/2018     Ionized Calcium: No results found for: IONCA  Magnesium:   Lab Results   Component Value Date    MG 2.1 09/18/2018     Albumin:   Lab Results   Component Value Date    LABALBU 4.1 09/17/2018     Last 3 CK, CKMB, Troponin: (CKTOTAL:3,CKMB:3,TROPONINI:3)       URINE:)No results found for: Ofilia Neas     Radiology:   Reviewed.    Assessment:  1. Acute kidney injury, appears to be hemodynamically related vs CKD stage 3.   2. Exacerbation of COPD.  3. Hyperkalemia, secondary to AKI.  4. Elevated calcium.    Plan:  1. Holding aldactone for now  2. D/C IVF, resuming diuretics  3. Mild unilateral hydro on [place], consider GU eval if Cr continues to rise  4. Renal diet/TF with oral fluid restriction of 1000 ml/24 hours.   5. Avoid hypotension, nephrotoxic drugs, Lovenox, Fleets enema and IV contrast exposure.  5. Follow up chemistries ordered for later today and for AM.    Thank you for the consultation. Please do not hesitate to contact us for any further questions/concerns. We will continue to follow along with you.       Electronically signed by Phil Dopp, MD  on 09/18/2018 at 12:58 PM   [facility].  Ph: [phone]

## 2018-09-18 NOTE — Discharge Instructions (Addendum)
Continuity of Care Form    Patient Name: Katherine Cunningham   DOB:  03-15-1933  MRN:  [id number]    Admit date:  09/17/2018  Discharge date:  09/24/2018    Code Status Order: Full Code   Advance Directives:   Advance Care Flowsheet Documentation     Date/Time Healthcare Directive Type of Healthcare Directive Copy in Chart Healthcare Agent Appointed Healthcare Agent's Name Healthcare Agent's Phone Number    09/17/18 1258  Yes, patient has an advance directive for healthcare treatment  Durable power of attorney for health care;Health care treatment directive;Living will  No, copy requested from family  Healthcare power of attorney  Rito Ehrlich  --          Admitting Physician:  Phineas Inches, MD  PCP: Gwenette Greet, MD    Discharging Nurse: [facility] Unit/Room#: 1020  Discharging Unit Phone Number: [phone]    Emergency Contact:   Extended Emergency Contact Information  Primary Emergency Contact: DeederReuel Boom  Home Phone: [phone]  Mobile Phone: [phone]  Relation: Child    Past Surgical History:  Past Surgical History:   Procedure Laterality Date   . PACEMAKER PLACEMENT      with defibrillator       Immunization History:     There is no immunization history on file for this patient.    Active Problems:  Patient Active Problem List   Diagnosis Code   . CHF (congestive heart failure), NYHA class II, acute, combined (HCC) I50.41       Isolation/Infection:   Isolation          No Isolation        Patient Infection Status     Infection Onset Added Last Indicated Last Indicated By Review Planned Expiration Resolved Resolved By    None active    Resolved    COVID-19 Rule Out 09/17/18 09/17/18 09/17/18 COVID-19 (Ordered)   09/17/18 Rule-Out Test Resulted          Nurse Assessment:  Last Vital Signs: BP 133/84   Pulse 83   Temp 98.2 F (36.8 C) (Oral)   Resp 24   Ht 5\' 2"  (1.575 m)   Wt 85 lb (38.6 kg)   SpO2 97%   BMI 15.55 kg/m     Last documented pain score (0-10 scale): Pain Level:  10  Last Weight:   Wt Readings from Last 1 Encounters:   09/17/18 85 lb (38.6 kg)     Mental Status:  A&OX4    IV Access:  - None    Nursing Mobility/ADLs:  Walking   Assisted  Transfer  Assisted  Bathing  Assisted  Dressing  Assisted  Toileting  Assisted  Feeding  Independent  Med Admin  Independent  Med Delivery   whole    Wound Care Documentation and Therapy:        Elimination:  Continence:    Bowel: Yes   Bladder: Yes  Urinary Catheter: None   Colostomy/Ileostomy/Ileal Conduit: No       Date of Last BM: ***    Intake/Output Summary (Last 24 hours) at 09/18/2018 1329  Last data filed at 09/18/2018 0452  Gross per 24 hour   Intake 1543 ml   Output 800 ml   Net 743 ml     I/O last 3 completed shifts:  In: 1543 [P.O.:300; I.V.:993; IV Piggyback:250]  Out: 800 [Urine:800]    Safety Concerns:     At Risk for Falls  Impairments/Disabilities:      Vision and Hearing, R hearing aid    Nutrition Therapy:  Current Nutrition Therapy:   - Oral Diet:  General, low potassium     Routes of Feeding: Oral  Liquids: No Restrictions  Daily Fluid Restriction: yes - amount  Last Modified Barium Swallow with Video (Video Swallowing Test): not done    Treatments at the Time of Hospital Discharge:   Respiratory Treatments: see mar   Oxygen Therapy:  is on oxygen at 2 L/min per nasal cannula.  Ventilator:    - BiPAP   IPAP: 12 cmH20, CPAP/EPAP: 8 cmH2O device from facility    Rehab Therapies: Physical Therapy and Occupational Therapy  Weight Bearing Status/Restrictions: No weight bearing restirctions  Other Medical Equipment (for information only, NOT a DME order):  wheelchair, walker and bath bench  Other Treatments:   Skilled RN assessments      Patient's personal belongings (please select all that are sent with patient):  Glasses, Right hearing aid     RN SIGNATURE:  Electronically signed by Elinor Parkinson, RN on 09/24/18 at 6:08 PM EDT    CASE MANAGEMENT/SOCIAL WORK SECTION    Inpatient Status Date: 5/22    Readmission Risk  Assessment Score:  Readmission Risk              Risk of Unplanned Readmission:        17         Discharging to Facility/ Agency    Name[facility]    Address:[address], [place], [place] [address]   Phone:[phone]   Fax:[phone]    Discharging to Facility/ Agency    Name: [facility]   Address:   Phone:   Fax:    Dialysis Facility (if applicable)    Name:   Address:   Dialysis Schedule:   Phone:   Fax:    Case Manager/Social Worker signature: Electronically signed by Donato Schultz, RN on 09/24/18 at 3:36 PM EDT    PHYSICIAN SECTION    Prognosis: Fair    Condition at Discharge: Stable    Rehab Potential (if transferring to Rehab): Fair    Recommended Labs or Other Treatments After Discharge: pls make appt w Dr. Madelyn Flavors asap for pain mgmt    Physician Certification: I certify the above information and transfer of Faren Carles  is necessary for the continuing treatment of the diagnosis listed and that she requires Skilled Nursing Facility for less 30 days.     Update Admission H&P: No change in H&P    PHYSICIAN SIGNATURE:  Electronically signed by Joanne Chars, MD on 09/24/18 at 7:38 PM EDT

## 2018-09-18 NOTE — Progress Notes (Signed)
Occupational Therapy   Occupational Therapy Initial Assessment  Date: 09/18/2018   Patient Name: Katherine Cunningham  MRN: [id number]     DOB: Sep 21, 1932    Date of Service: 09/18/2018    Discharge Recommendations:  Subacute/Skilled Nursing Facility, Continue to assess pending progress     RN reports patient is medically stable for therapy treatment this date.    Chart reviewed prior to treatment and patient is agreeable for therapy.  All lines intact and patient positioned comfortably at end of treatment.  All patient needs addressed prior to ending therapy session.    Assessment   Performance deficits / Impairments: Decreased functional mobility ;Decreased ADL status;Decreased strength;Decreased safe awareness;Decreased balance;Decreased cognition;Decreased endurance;Decreased posture  Prognosis: Good  Decision Making: Medium Complexity  OT Education: OT Role;Energy Conservation;Plan of Care;Home Exercise Program;Precautions;ADL Adaptive Strategies;Transfer Training;Equipment;Family Education;IADL Safety  REQUIRES OT FOLLOW UP: Yes  Activity Tolerance  Activity Tolerance: Patient Tolerated treatment well;Patient limited by fatigue  Safety Devices  Safety Devices in place: Yes  Type of devices: Call light within reach;Nurse notified;Gait belt;Patient at risk for falls;Left in bed           Patient Diagnosis(es): The primary encounter diagnosis was COPD exacerbation (HCC). Diagnoses of Chronic systolic congestive heart failure (HCC), Shortness of breath, and Hypoxia were also pertinent to this visit.     has a past medical history of Bladder cancer (HCC), CHF (congestive heart failure) (HCC), COPD (chronic obstructive pulmonary disease) (HCC), Neck injury, and Thyroid disease.   has a past surgical history that includes pacemaker placement.           Restrictions  Restrictions/Precautions  Restrictions/Precautions: Up as Tolerated, General Precautions, Fall Risk    Subjective   General  Chart Reviewed: Yes  Patient assessed  for rehabilitation services?: Yes  Family / Caregiver Present: No  Patient Currently in Pain: Yes  Vital Signs  Pulse: 89  Resp: 22  BP: 127/81  MAP (mmHg): 95  Patient Currently in Pain: Yes  Social/Functional History  Social/Functional History  Lives With: Spouse  Type of Home: House  Home Layout: One level  Bathroom Shower/Tub: Tub/Shower unit  Home Equipment: Medical laboratory scientific officerCane  ADL Assistance: Independent  Homemaking Assistance: Needs assistance(family brings food, has cleaning lady. pt can do simple tasks)  Ambulation Assistance: Independent(no AD)  Transfer Assistance: Independent  Active Driver: Yes  Mode of Transportation: Car  Additional Comments: pt is a questionable historian this date.        Objective   Vision: Impaired  Vision Exceptions: Wears glasses at all times  [facility]  Hearing Exceptions: Hard of hearing/hearing concerns;Bilateral hearing aid    Orientation  Overall Orientation Status: Impaired  Orientation Level: Oriented to person;Disoriented to place;Disoriented to time;Disoriented to situation(grossly oriented to time (month), not sure about hospital and situation. Pt is also very HOH and does not understand all questions.)  Observation/Palpation  Posture: Fair  Observation: frail; talkative. monitor O2 sats.   Balance  Sitting Balance: Contact guard assistance(pt sat EOB x 15 min, initially min A to gain balance and then progressed to SBA/CGA. Pt in general fatigues easily. )  Standing Balance: Moderate assistance(min-mod A with RW to stand at bedside)  Functional Mobility  Functional - Mobility Device: Rolling Walker  Assist Level: Moderate assistance  Functional Mobility Comments: min-mod A to take ~3-4 side steps at bedside. Pt is weak and too SOB/fatigued to safely transfer/stay up in chair. Pt is also confused somewhat and needs repetition of safety instruction  ADL  Feeding: Setup;Supervision(pt needing set up for breakfast food and some encouragement to eat. )  Grooming: Minimal  assistance;Setup(min A for simple grooming tasks at EOB and cues to initiate)  UE Bathing: Minimal assistance;Setup  LE Bathing: Moderate assistance;Maximum assistance;Setup  UE Dressing: Minimal assistance;Setup  LE Dressing: Moderate assistance;Maximum assistance(max A for donning socks, pt unable to maintain sitting balance with attempt to reach socks.)  Toileting: Maximum assistance;Moderate assistance  Additional Comments: pt is limited by SOB, fatigue, dec. standing tolerance/balance for standing portions. Dec. safety awareness as well  Tone RUE  RUE Tone: Normotonic  Tone LUE  LUE Tone: Normotonic  Coordination  Movements Are Fluid And Coordinated: Yes     Bed mobility  Bridging: Independent  Rolling to Left: Modified independent  Rolling to Right: Modified independent  Supine to Sit: Modified independent;Contact guard assistance  Sit to Supine: Modified independent;Contact guard assistance  Transfers  Sit to stand: Minimal assistance  Stand to sit: Minimal assistance     Cognition  Overall Cognitive Status: Exceptions  Following Commands: Follows one step commands with repetition  Attention Span: Appears intact  Memory: Decreased recall of recent events  Safety Judgement: Decreased awareness of need for safety;Decreased awareness of need for assistance  Problem Solving: Assistance required to generate solutions;Assistance required to implement solutions;Decreased awareness of errors;Assistance required to correct errors made  Insights: Decreased awareness of deficits  Initiation: Requires cues for some  Sequencing: Requires cues for some  Cognition Comment: pt is also very HOH which limits pt's understanding of instruction and questions. Will continue to assess. Per notes, it is questionable whether pt has been confused at home and has memory deficits and ability to follow through on med mangement/other higher level tasks.   Perception  Overall Perceptual Status: WFL     Sensation  Overall Sensation Status:  WFL        LUE AROM (degrees)  LUE AROM : WFL  RUE AROM (degrees)  RUE AROM : WFL  LUE Strength  Gross LUE Strength: WFL  LUE Strength Comment: B UE's 4-/5  RUE Strength  Gross RUE Strength: WFL                   Plan   Plan  Times per week: 4-5x/week, 1-2x/day  Current Treatment Recommendations: Strengthening, Location manager, Building services engineer, Teaching laboratory technician, Psychologist, educational, Mining engineer, Education, Sports administrator, Equities trader, Engineer, petroleum / ADL, Art therapist, Patent attorney, Chemical engineer term goals  Time Frame for Short term goals: by discharge, pt will  Short term goal 1: demo SBA with ADL transfers with good safety, AD/DME as needed  Short term goal 2: demo SBA with functional mob in room with good safety for ADL completion with good safety/pacing and approp aD  Short term goal 3: demo SBA with UB ADLs and min A LB ADLs with good safety/pacing, DME as needed  Short term goal 4: demo CGA with toileting routine with good safety  Short term goal 5: demo and verb good understanding of fall prevention techs, EC/WS techs, B UE HEP, and possible equip needs   Patient Goals   Patient goals : to go  home when able       Therapy Time   Individual Concurrent Group Co-treatment   Time In 0814         Time Out 0904         Minutes  50         Timed Code Treatment Minutes: 39 Minutes    Patient would benefit from SNF for continued occupational therapy to increase independence with  ADL of bathing, dressing, toileting and grooming. Writer recommending SNF placement for for activity tolerance and strength which will increase independence with ADL's coordinated with bed mobility and chair transfers. Continued skilled OT services to address decreased safety awareness with ADL and IADL tasks and for education and increased independence with DME and AE for fall prevention and ec/ws techniques prior to d/c home.    Donnamarie Rossetti, OT

## 2018-09-18 NOTE — Consults (Signed)
Cardiology consult dictated.    Impression:    1.  Acute on chronic systolic congestive heart failure.    2.  History of defibrillator implantation for dilated cardiomyopathy.    3.  Severe advanced COPD.    4.  Acute renal insufficiency    5.  Hyperkalemia    Plan: IV furosemide 40 mg now and 20 mg IV daily.    Nephrology service has been consulted.    Spironolactone has been on hold in view of hyperkalemia.    Patient's overall prognosis is very poor due to [place] functional Heart Association class IV systolic heart failure and advanced COPD that is dependent on supplemental oxygen.    I recommend palliative care.

## 2018-09-18 NOTE — Care Coordination-Inpatient (Signed)
Discharge Planner    Met with patient and she is able to state her name but is concerned about calling her son and will not focus on answering any questins.  She is easily flustered.    Phone call to pt's son Katherine Cunningham and reviewed home situation.  Katherine Cunningham states pt drives and ambulates independently  She has O2 concentrator with portability and a nebulizer. He does not know the provider.  Otherwise there is no other DME.  Explained to Katherine Cunningham that physical therapy is recommending a SNF at this time but he states his mom will never consider it.    Katherine Cunningham would like home care with [facility] if possible at d/c  Explained will have to give him mom a few more days to see how she is doing.  Pt is very concerned about getting home as she is primary caregiver for her husband who has early dementia.  Katherine Cunningham says normally his mom is very sharp but then he voices concerns she is not taking her medicine correctly and they would like home care for that reason.

## 2018-09-19 ENCOUNTER — Inpatient Hospital Stay: Admit: 2018-09-19 | Payer: MEDICARE | Primary: Family Medicine

## 2018-09-19 LAB — TROP/MYOGLOBIN
Myoglobin: 140 ng/mL — ABNORMAL HIGH (ref 25–58)
Troponin, High Sensitivity: 33 ng/L — ABNORMAL HIGH (ref 0–14)

## 2018-09-19 LAB — BASIC METABOLIC PANEL
Anion Gap: 16 mmol/L (ref 9–17)
BUN: 37 mg/dL — ABNORMAL HIGH (ref 8–23)
Bun/Cre Ratio: 28 — ABNORMAL HIGH (ref 9–20)
CO2: 24 mmol/L (ref 20–31)
Calcium: 8.9 mg/dL (ref 8.6–10.4)
Chloride: 97 mmol/L — ABNORMAL LOW (ref 98–107)
Creatinine: 1.33 mg/dL — ABNORMAL HIGH (ref 0.50–0.90)
GFR African American: 46 mL/min — ABNORMAL LOW (ref >60)
GFR Non-African American: 38 mL/min — ABNORMAL LOW (ref >60)
Glucose: 201 mg/dL — ABNORMAL HIGH (ref 70–99)
Potassium: 3.9 mmol/L (ref 3.7–5.3)
Sodium: 137 mmol/L (ref 135–144)

## 2018-09-19 LAB — CBC WITH AUTO DIFFERENTIAL
Absolute Eos #: 0 10*3/uL (ref 0.0–0.4)
Absolute Immature Granulocyte: 0.19 10*3/uL (ref 0.00–0.30)
Absolute Lymph #: 0.19 10*3/uL — ABNORMAL LOW (ref 1.0–4.8)
Absolute Mono #: 0.56 10*3/uL (ref 0.2–0.8)
Basophils Absolute: 0 10*3/uL (ref 0.0–0.2)
Basophils: 0 %
Eosinophils %: 0 % — ABNORMAL LOW (ref 1–4)
Hematocrit: 35.4 % — ABNORMAL LOW (ref 36.3–47.1)
Hemoglobin: 11.3 g/dL — ABNORMAL LOW (ref 11.9–15.1)
Immature Granulocytes: 1 % — ABNORMAL HIGH
Lymphocytes: 1 % — ABNORMAL LOW (ref 24–44)
MCH: 32.1 pg (ref 25.2–33.5)
MCHC: 31.9 g/dL (ref 28.4–34.8)
MCV: 100.6 fL (ref 82.6–102.9)
MPV: 9.3 fL (ref 8.1–13.5)
Monocytes: 3 % (ref 1–7)
NRBC Automated: 0 per 100 WBC
Platelets: 215 10*3/uL (ref 138–453)
RBC: 3.52 m/uL — ABNORMAL LOW (ref 3.95–5.11)
RDW: 15.8 % — ABNORMAL HIGH (ref 11.8–14.4)
Seg Neutrophils: 95 % — ABNORMAL HIGH (ref 36–66)
Segs Absolute: 17.66 10*3/uL — ABNORMAL HIGH (ref 1.8–7.7)
WBC: 18.6 10*3/uL — ABNORMAL HIGH (ref 3.5–11.3)

## 2018-09-19 LAB — CK: Total CK: 145 U/L (ref 26–192)

## 2018-09-19 LAB — POC GLUCOSE FINGERSTICK
POC Glucose: 148 mg/dL — ABNORMAL HIGH (ref 65–105)
POC Glucose: 157 mg/dL — ABNORMAL HIGH (ref 65–105)
POC Glucose: 157 mg/dL — ABNORMAL HIGH (ref 65–105)
POC Glucose: 123 mg/dL — ABNORMAL HIGH (ref 65–105)

## 2018-09-19 LAB — PHOSPHORUS: Phosphorus: 3.6 mg/dL (ref 2.6–4.5)

## 2018-09-19 LAB — PROCALCITONIN: Procalcitonin: 0.11 ng/mL — ABNORMAL HIGH (ref <0.09)

## 2018-09-19 LAB — BRAIN NATRIURETIC PEPTIDE: Pro-BNP: 12117 pg/mL — ABNORMAL HIGH (ref <300)

## 2018-09-19 LAB — MAGNESIUM: Magnesium: 2.1 mg/dL (ref 1.6–2.6)

## 2018-09-19 MED ORDER — CALCIUM CARBONATE ANTACID 500 MG PO CHEW
500 MG | Freq: Three times a day (TID) | ORAL | Status: DC | PRN
Start: 2018-09-19 — End: 2018-09-25
  Administered 2018-09-19 – 2018-09-25 (×2): 500 mg via ORAL

## 2018-09-19 MED ORDER — FUROSEMIDE 10 MG/ML IJ SOLN
10 MG/ML | Freq: Every day | INTRAMUSCULAR | Status: DC
Start: 2018-09-19 — End: 2018-09-19
  Administered 2018-09-19: 14:00:00 40 mg via INTRAVENOUS

## 2018-09-19 MED ORDER — FUROSEMIDE 40 MG PO TABS
40 MG | Freq: Every day | ORAL | Status: DC
Start: 2018-09-19 — End: 2018-09-25
  Administered 2018-09-20 – 2018-09-24 (×5): 40 mg via ORAL

## 2018-09-19 MED ORDER — NITROGLYCERIN 0.4 MG SL SUBL
0.4 MG | SUBLINGUAL | Status: AC
Start: 2018-09-19 — End: 2018-09-19
  Administered 2018-09-19: 14:00:00

## 2018-09-19 MED ORDER — IPRATROPIUM-ALBUTEROL 0.5-2.5 (3) MG/3ML IN SOLN
RESPIRATORY_TRACT | Status: DC
Start: 2018-09-19 — End: 2018-09-25
  Administered 2018-09-19 – 2018-09-24 (×19): 1 via RESPIRATORY_TRACT

## 2018-09-19 MED ORDER — NITROGLYCERIN 0.4 MG SL SUBL
0.4 MG | SUBLINGUAL | Status: DC | PRN
Start: 2018-09-19 — End: 2018-09-25

## 2018-09-19 MED FILL — IPRATROPIUM-ALBUTEROL 0.5-2.5 (3) MG/3ML IN SOLN: RESPIRATORY_TRACT | Qty: 3

## 2018-09-19 MED FILL — FUROSEMIDE 10 MG/ML IJ SOLN: 10 MG/ML | INTRAMUSCULAR | Qty: 4

## 2018-09-19 MED FILL — SOLU-MEDROL 40 MG IJ SOLR: 40 MG | INTRAMUSCULAR | Qty: 40

## 2018-09-19 MED FILL — CYCLOBENZAPRINE HCL 10 MG PO TABS: 10 MG | ORAL | Qty: 1

## 2018-09-19 MED FILL — FAMOTIDINE 20 MG PO TABS: 20 MG | ORAL | Qty: 1

## 2018-09-19 MED FILL — CALCIUM ANTACID 500 MG PO CHEW: 500 MG | ORAL | Qty: 1

## 2018-09-19 MED FILL — HYDROCODONE-ACETAMINOPHEN 5-325 MG PO TABS: 5-325 MG | ORAL | Qty: 1

## 2018-09-19 MED FILL — MIRTAZAPINE 15 MG PO TABS: 15 MG | ORAL | Qty: 1

## 2018-09-19 MED FILL — AMIODARONE HCL 200 MG PO TABS: 200 MG | ORAL | Qty: 1

## 2018-09-19 MED FILL — HEPARIN SODIUM (PORCINE) 5000 UNIT/ML IJ SOLN: 5000 UNIT/ML | INTRAMUSCULAR | Qty: 1

## 2018-09-19 MED FILL — ACETAMINOPHEN 325 MG PO TABS: 325 MG | ORAL | Qty: 2

## 2018-09-19 MED FILL — AZITHROMYCIN 500 MG IV SOLR: 500 MG | INTRAVENOUS | Qty: 500

## 2018-09-19 MED FILL — NITROGLYCERIN 0.4 MG SL SUBL: 0.4 MG | SUBLINGUAL | Qty: 1

## 2018-09-19 MED FILL — LEVOTHYROXINE SODIUM 25 MCG PO TABS: 25 MCG | ORAL | Qty: 1

## 2018-09-19 MED FILL — FERROUS SULFATE 325 (65 FE) MG PO TBEC: 325 (65 Fe) MG | ORAL | Qty: 1

## 2018-09-19 MED FILL — SENNA 8.6 MG PO TABS: 8.6 MG | ORAL | Qty: 1

## 2018-09-19 NOTE — Progress Notes (Signed)
Section of Cardiology  Progress Note      Date:  09/19/2018  Patient: Katherine Cunningham  Admission:  09/17/2018  8:28 AM  Admit DX: CHF (congestive heart failure), NYHA class II, acute, combined (HCC) [I50.41]  Age:  85 y.o., 1932/11/22     LOS: 2 days     Reason for evaluation:   ACUTE ON CHRONIC SYSTOLIC CHF.  SEVERE LV SYSTOLIC DYSFUNCTION.        SUBJECTIVE:     The patient was seen and examined. Notes and labs reviewed.  There were not complications over night.    Patient's cardiac review of systems: positive for fatigue.  The patient is generally feeling unchanged.    OBJECTIVE:      EXAM:   Vitals:    VITALS:  BP (!) 114/45    Pulse 86    Temp 97.5 ??F (36.4 ??C)    Resp 16    Ht 5\' 2"  (1.575 m)    Wt 93 lb 3.2 oz (42.3 kg)    SpO2 97%    BMI 17.05 kg/m??   24HR INTAKE/OUTPUT:      Intake/Output Summary (Last 24 hours) at 09/19/2018 1023  Last data filed at 09/19/2018 0519  Gross per 24 hour   Intake 840 ml   Output 950 ml   Net -110 ml       CONSTITUTIONAL:  awake, alert, cooperative, no apparent distress, and appears stated age.  HEENT: Normal jugular venous pulsations, no carotid bruits. Head is atraumatic, normocephalic. Eyes and oral mucosa are normal.  LUNGS: Good respiratory effort On auscultation: clear to auscultation bilaterally and rhonchi bibasilar  CARDIOVASCULAR:  Normal apical impulse, regular rate and rhythm, normal S1 and S2, 1-2/6 SM AT APEX.dorsalis pedis and bilateralpresent 2+  ABDOMEN: Soft, nontender, nondistended. Bowel sounds present. No masses or tenderness. No bruit.  SKIN: Warm and dry.  EXTREMITIES:TRACE EDEMA.  Current Inpatient Medications:  ??? furosemide  40 mg Intravenous Daily   ??? sodium chloride flush  10 mL Intravenous BID   ??? sodium chloride flush  10 mL Intravenous 2 times per day   ??? amiodarone  200 mg Oral Daily   ??? cyclobenzaprine  10 mg Oral TID   ??? ferrous sulfate  325 mg Oral Daily   ??? levothyroxine  25 mcg Oral Daily   ??? mirtazapine  15 mg Oral Nightly   ???  budesonide-formoterol  2 puff Inhalation BID   ??? famotidine  20 mg Oral Daily   ??? heparin (porcine)  5,000 Units Subcutaneous BID   ??? methylPREDNISolone  40 mg Intravenous Q6H   ??? azithromycin  500 mg Intravenous Q24H   ??? ipratropium-albuterol  1 ampule Inhalation Q4H       IV Infusions (if any):  ??? dextrose       MDM: I REVIEWED ALL THE FOLLOWING LABS, AND TELEMETRY DATA.  Diagnostics:   Telemetry:VENTRICULAR PACED RHYTHM.      Labs:   CBC:  Recent Labs     09/18/18  0150 09/19/18  0426   WBC 10.8 18.6*   HGB 12.7 11.3*   HCT 39.4 35.4*   PLT 267 215     Magnesium:  Recent Labs     09/18/18  0150 09/19/18  0426   MG 2.1 2.1     BMP:  Recent Labs     09/18/18  0150 09/19/18  0426   NA 134* 137   K 5.1 3.9   CALCIUM 9.3 8.9  CO2 23 24   BUN 28* 37*   CREATININE 1.17* 1.33*   LABGLOM 44* 38*   GLUCOSE 132* 201*     BNP:  Recent Labs     09/17/18  0851 09/18/18  0150 09/19/18  0426   PROBNP 8,642* 14,592* 12,117*     PT/INR:No results for input(s): PROTIME, INR in the last 72 hours.  APTT:No results for input(s): APTT in the last 72 hours.  CARDIAC ENZYMES:  Recent Labs     09/17/18  1145 09/17/18  1921 09/18/18  0150   TROPHS 43* 40* 39*   TROPONINT NOT REPORTED NOT REPORTED NOT REPORTED     FASTING LIPID PANEL:No results found for: HDL, LDLDIRECT, LDLCALC, TRIG  LIVER PROFILE:  Recent Labs     09/17/18  0851 09/18/18  0150   AST 36*  --    ALT 22  --    LABALBU 4.1  --    ALKPHOS 103  --    BILITOT 0.14*  --    PROT 7.5 6.9   ALBUMIN NOT REPORTED  --         ASSESSMENT:  1. ACUTE ON CHRONIC SYSTOLIC CHF.  2. SEVERE LV SYSTOLIC DYSFUNCTION WITH LVEF OF LESS THAN 35%  3. SEVERE ADVANCED COPD.  4.    HX VENTRICULAR TACHYCARDIA.    Patient Active Problem List   Diagnosis   ??? CHF (congestive heart failure), NYHA class II, acute, combined (HCC)       PLAN:  1. CONTINUE IV FUROSEMIDE AT 40 MG DAILY.  2. PATIENT'S PROGNOSIS IS VERY POOR DUE TO SEVERE, AND MULTIPLE CO-MORBIDITIES.    3. I RECOMMEND PALLIATIVE  CARE.            Discussed with patient and nursing.    Raynelle DickKesari B Wynell Halberg, MD

## 2018-09-19 NOTE — Other (Signed)
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 Dr Peter Garter paged and notified of episode and BNP 12,117 - pt breathing appears more labored.. Orders received. After tums given -no relief noted and pt appears more in distress. States her throat and chest and neck hurt so bad.  EKG done per Clinical research associate. B/P at 146/67. O2 remains at 2 liters sats at 98%. Trops and myoglobin ordered stat and Dr Donnetta Simpers paged.at 1007. EKG remains without change from previous. Awanda Mink given for pain at 1015.Pt states she feels slightly better. Dr Donnetta Simpers rounding and updated on pt condition and care done.Dr Donnetta Simpers looking at tongue per writer request as thought was "fat" . Dr Donnetta Simpers stated tongue is okay.  EKG shown. Orders for 1 nitro SL given. @1025  B/P at 131/60 after Nitro and pt states relief.Per Dr Donnetta Simpers states she is having esophageal spasms.Not cardiac related pain. Telemetry A-V paced Pt resting comfortably -1030.  1045 updated family via phone

## 2018-09-19 NOTE — Progress Notes (Addendum)
Pulmonary Critical Care Progress Note    Patient seen for the follow up of <principal problem not specified>     Subjective:    She had an episode of retrosternal pain/spasm radiating to the back.  She received nitroglycerin with improvement.  She gives history of nutcracker esophagus.  She has been having less frequent episodes than before as she reports.  She still on oxygen at 2 L nasal cannula.  She is on oxygen at night at home.  She has no change in shortness of breath.  She has occasional cough with mild sputum production..  She has quit smoking years ago.  She has tolerated oral intake.    Examination:    Vitals: BP (!) 125/49    Pulse 82    Temp 97.3 ??F (36.3 ??C) (Oral)    Resp 16    Ht 5\' 2"  (1.575 m)    Wt 93 lb 3.2 oz (42.3 kg)    SpO2 100%    BMI 17.05 kg/m??   SpO2  Avg: 97.7 %  Min: 96 %  Max: 100 %  General appearance: alert and cooperative with exam  Neck: No JVD  Lungs: Decreased breath sounds no crackles  Heart: regular rate and rhythm, S1, S2 normal, no gallop  Abdomen: Soft, non tender, + BS  Extremities: no cyanosis or clubbing. No significant edema    LABs:    CBC:   Recent Labs     09/18/18  0150 09/19/18  0426   WBC 10.8 18.6*   HGB 12.7 11.3*   HCT 39.4 35.4*   PLT 267 215     BMP:   Recent Labs     09/18/18  0150 09/19/18  0426   NA 134* 137   K 5.1 3.9   CO2 23 24   BUN 28* 37*   CREATININE 1.17* 1.33*   LABGLOM 44* 38*   GLUCOSE 132* 201*     LIVER PROFILE:  Recent Labs     09/17/18  0851   AST 36*   ALT 22   LABALBU 4.1     Results for Katherine, Cunningham (MRN [id number]) as of 09/19/2018 13:17   Ref. Range 09/19/2018 04:26   Pro-BNP Latest Ref Range: <300 pg/mL 12,117 (H)   BNP Interpretation Unknown Pro-BNP Reference Range:     Radiology:  Chest x-ray 5/23  COPD/emphysema. ??No acute process is seen. ??No significant change from the May 22nd study            Echocardiogram  Technically difficult study due to patient size and position.  Left ventricle is normal in size  Global left ventricular  systolic function is severely reduced  Estimated ejection fraction is 25 % .  Evidence of diastolic dysfunction.  Trivial tricuspid regurgitation.  No pulmonary hypertension.  No pericardial effusion seen.  Normal aortic root dimension    Impression:  ?? Acute on chronic hypoxic respiratory failure requiring BiPAP therapy  ?? Acute exacerbation of COPD/severe emphysema/COVID-19 negative  ?? Pulmonary nodules, sub-centimeter   ?? Pulmonary edema  ?? Elevated d-dimer, CTA negative for pulmonary embolism  ?? Nutcracker esophagus/atypical chest pain  ?? AKI/hyperkalemia  ?? CAD, AICD, CHF    Recommendations:  ??  oxygen by nasal cannula  ?? BiPAP support PRN  ?? Incentive spirometry every hour while awake  ?? Make DuoNeb by nebulizer every 4 hours as awake  ?? Off Pulmicort every 12 hours by nebs  ?? Off Performist by nebulizer every 12 hours  ??  Symbicort 160 twice daily  ??  IV antibiotics,  Zithromax  ?? Lasix 40 IV daily  ?? Repeat chest x-ray/check procalcitonin  ?? IV solu-medrol 40 mg every 6 hours  ?? Lower extremity venous Dopplers  ?? X-ray chest in am  ?? Cardiology evaluation  ?? Bedside physical therapy  ?? Discussed with RN and RT  ?? DVT prophylaxis with low molecular weight heparin    Ardelle AntonNader M Deanie Jupiter, MD, [facility]CCP  Pulmonary Critical Care and Sleep Medicine,  [facility]  Cell: [phone]  Office: [phone]

## 2018-09-19 NOTE — Plan of Care (Signed)
Problem: Falls - Risk of:  Goal: Will remain free from falls  Description: Will remain free from falls  09/19/2018 0139 by Greta Doom, RN  Outcome: Ongoing  Note: Patient is fall risk per fall scale. Hourly rounding performed. Personal belongings and call light within reach. Bed in low position.Bed alarm placed.     09/18/2018 1515 by Einar Crow, RN  Outcome: Met This Shift  Note: The patient remained free from falls this shift, call light within reach, bed in locked and lowest position.  Side rails up x2.  Continue to monitor closely.      Problem: Pain:  Goal: Pain level will decrease  Description: Pain level will decrease  09/19/2018 0139 by Greta Doom, RN  Outcome: Ongoing  09/18/2018 1515 by Einar Crow, RN  Outcome: Met This Shift  Note: Patient's pain has been well controlled throughout the entire shift, please see MAR.   Goal: Control of acute pain  Description: Control of acute pain  Outcome: Ongoing  Goal: Control of chronic pain  Description: Control of chronic pain  Outcome: Ongoing

## 2018-09-19 NOTE — Progress Notes (Signed)
Progress note  [facility].,    Adult Hospitalist      Name: Katherine Cunningham  MRN: [id number]     Acct: [id number]  Room: [id number]    Admit Date: 09/17/2018  8:28 AM  PCP: Gwenette Greet, MD    Primary Problem  Active Problems:    CHF (congestive heart failure), NYHA class II, acute, combined (HCC)  Resolved Problems:    * No resolved hospital problems. *        Assesment:     ?? Acute combined systolic and diastolic CHF  ?? Elevated troponin  ?? Acute COPD exacerbation  ?? Acute respiratory insufficiency  ?? Right lung base pulmonary nodule  ?? Hyperkalemia  ?? Acute renal insufficiency  ?? Hypothyroidism  ?? Cardiomyopathy status post AICD  ?? History of bladder cancer        Plan:     ?? Patient is currently in ICU  ?? O2 to maintain oxygen saturation greater than 92%  ?? BiPAP PRN  ?? Serial troponin elevated  ?? Echocardiogram shows EF 25% and diastolic dysfunction  ?? IV Zithromax  ?? IV Lasix, holding Aldactone  ?? IV Solu-Medrol  ?? Duo nebs  ?? Monitor BMP twice daily  ?? Holding spironolactone   ?? Monitor creatinine, worsening  ?? Continue amiodarone  ?? Continue Synthroid, Remeron, Mirapex  ?? Cardiology consult, input noted  ?? Pulmonology consult, input noted  ?? Nephrology consult, input noted  ?? DVT and GI prophylaxis.        Chief Complaint:     Chief Complaint   Patient presents with   ??? Shortness of Breath     unable to crae for self at home         History of Present Illness:      Patient seen and examined at bedside.  Shortness of breath is improved  potassium improved  No acute events overnight   afebrile   Denies any  headache, dizziness, cough, cold, changes in urination or bowel habits    HPI:    Katherine Cunningham is a 85 y.o.  female who presents with Shortness of Breath (unable to crae for self at home)    85 year old lady with past medical history of CHF, coronary disease status post AICD, COPD, hypothyroidism presented to ER complaining of shortness of breath.  Patient lives at home with her husband who has  dementia.  Patient noticed to have progressive shortness of breath with worsening in the past 24 hours.  Patient denies any fever, chills, cough, nausea, vomiting, change urination, bowel habit.  Patient uses 2 L home O2 at night.  On presentation to ER sheWas tachypneic with respiratory rate in 34 and was requiring 2 to 4 L to maintain her oxygen saturation.  There is a concern by paramedics that patient is probably not able to take care of herself at home.On presentation her BNP was 8642, her troponin was 40, her potassium was 5.5, creatinine was 1.02.  Her LDH was 310, ferritin was 269, d-dimer was 3.12.  Her WBC was 12,000.  Her COVID test was negative.  She has a CTA chest which was negative for PE but shows severe emphysematous changes, pulmonary nodules at the right lung base, bronchiectasis..  Patient admitted with cardiology and pulmonology consult for further management.  I have personally reviewed the past medical history, past surgical history, medications, social history, and family history, and summarized in the note.    Review of Systems:  All 10 point system is reviewed and negative otherwise mentioned in HPI.      Past Medical History:     Past Medical History:   Diagnosis Date   ??? Bladder cancer (HCC)     remission   ??? CHF (congestive heart failure) (HCC)     stage 4   ??? COPD (chronic obstructive pulmonary disease) (HCC)    ??? Neck injury     has injections done for disk issue   ??? Thyroid disease         Past Surgical History:     Past Surgical History:   Procedure Laterality Date   ??? PACEMAKER PLACEMENT      with defibrillator        Medications Prior to Admission:       Prior to Admission medications    Medication Sig Start Date End Date Taking? Authorizing Provider   amiodarone (CORDARONE) 200 MG tablet Take 1 tablet by mouth daily   Yes Historical Provider, MD   ferrous sulfate (IRON 325) 325 (65 Fe) MG tablet Take 1 tablet by mouth daily   Yes Historical Provider, MD   ipratropium-albuterol  (DUONEB) 0.5-2.5 (3) MG/3ML SOLN nebulizer solution Take 1 nebule by mouth every 8 hours as needed   Yes Historical Provider, MD   levothyroxine (SYNTHROID) 25 MCG tablet Take 1 tablet by mouth daily   Yes Historical Provider, MD   lidocaine (LIDODERM) 5 % Place 1 patch onto the skin as needed 11/17/17  Yes Historical Provider, MD   mirtazapine (REMERON) 15 MG tablet Take 15 mg by mouth nightly  08/31/17  Yes Historical Provider, MD   pramipexole (MIRAPEX) 0.25 MG tablet Take 1 tablet by mouth daily as needed  12/30/17  Yes Historical Provider, MD   budesonide-formoterol (SYMBICORT) 160-4.5 MCG/ACT AERO Take 2 puffs by mouth 2 times daily   Yes Historical Provider, MD   albuterol sulfate HFA (PROAIR HFA) 108 (90 Base) MCG/ACT inhaler Take 2 puffs by mouth every 4 hours as needed   Yes Historical Provider, MD   tiotropium (SPIRIVA RESPIMAT) 1.25 MCG/ACT AERS inhaler Take 2 puffs by mouth daily   Yes Historical Provider, MD   spironolactone (ALDACTONE) 25 MG tablet Take 0.5 tablets by mouth daily   Yes Historical Provider, MD   cyclobenzaprine (FLEXERIL) 10 MG tablet Take 1 tablet by mouth 3 times daily    Historical Provider, MD   silver sulfADIAZINE (SILVADENE) 1 % cream Apply 1 inch topically as needed 01/21/18   Historical Provider, MD   acetaminophen-codeine (TYLENOL/CODEINE #3) 300-30 MG per tablet Take 1 tablet by mouth every 8 hours as needed. 11/17/17   Historical Provider, MD        Allergies:       Aspirin and Penicillins    Social History:     Tobacco:    reports that she quit smoking about 15 years ago. She has never used smokeless tobacco.  Alcohol:      reports no history of alcohol use.  Drug Use:  reports no history of drug use.    Family History:     History reviewed. No pertinent family history.      Physical Exam:     Vitals:  BP 131/60    Pulse 84    Temp 97.5 ??F (36.4 ??C)    Resp 16    Ht 5\' 2"  (1.575 m)    Wt 93 lb 3.2 oz (42.3 kg)    SpO2 98%  BMI 17.05 kg/m??   Temp (24hrs), Avg:97.4 ??F (36.3 ??C),  Min:97.3 ??F (36.3 ??C), Max:97.5 ??F (36.4 ??C)          General appearance - alert, frail, and in no moderate distress  Mental status - oriented to person, place, and time with normal affect  Head - normocephalic and atraumatic  Eyes - pupils equal and reactive, extraocular eye movements intact, conjunctiva clear  Ears - hearing appears to be intact  Nose - no drainage noted  Mouth - mucous membranes moist  Neck - supple, no carotid bruits, thyroid not palpable  Chest -basal crackles, wheezing, increased effort  Heart - normal rate, regular rhythm, no murmur  Abdomen - soft, nontender, nondistended, bowel sounds present all four quadrants, no masses, hepatomegaly or splenomegaly  Neurological - normal speech, no focal findings or movement disorder noted, cranial nerves II through XII grossly intact  Extremities -2+ peripheral edema  Skin - no gross lesions, rashes, or induration noted        Data:     Labs:    Hematology:  Recent Labs     09/17/18  0851 09/18/18  0150 09/19/18  0426   WBC 12.0* 10.8 18.6*   RBC 4.08 3.91* 3.52*   HGB 12.9 12.7 11.3*   HCT 41.0 39.4 35.4*   MCV 100.5 100.8 100.6   MCH 31.6 32.5 32.1   MCHC 31.5 32.2 31.9   RDW 15.4* 15.6* 15.8*   PLT 235 267 215   MPV 9.4 9.7 9.3   CRP 13.0*  --   --    DDIMER 3.12*  --   --      Chemistry:  Recent Labs     09/17/18  0851  09/17/18  1445 09/17/18  1921 09/18/18  0150 09/19/18  0426 09/19/18  1112   NA 140  --  135  --  134* 137  --    K 5.5*  --  4.9  --  5.1 3.9  --    CL 101  --  99  --  96* 97*  --    CO2 25  --  21  --  23 24  --    GLUCOSE 91  --  79  --  132* 201*  --    BUN 24*  --  25*  --  28* 37*  --    CREATININE 1.02*  --  0.96*  --  1.17* 1.33*  --    MG  --   --   --   --  2.1 2.1  --    ANIONGAP 14  --  15  --  15 16  --    LABGLOM 52*  --  56*  --  44* 38*  --    GFRAA >60  --  >60  --  54* 46*  --    CALCIUM 10.2  --  9.8  --  9.3 8.9  --    PHOS  --   --   --   --  4.8* 3.6  --    PROBNP 8,642*  --   --   --  14,592* 12,117*  --     TROPHS 40*   < >  --  40* 39*  --  33*   CKTOTAL  --   --   --   --   --   --  145   MYOGLOBIN  --   --   --   --   --   --  140*    < > = values in this interval not displayed.     Recent Labs     09/17/18  0851 09/18/18  0150 09/18/18  0909 09/18/18  1321 09/18/18  1700 09/18/18  2034 09/19/18  0733   PROT 7.5 6.9  --   --   --   --   --    LABALBU 4.1  --   --   --   --   --   --    AST 36*  --   --   --   --   --   --    ALT 22  --   --   --   --   --   --    LDH 310*  --   --   --   --   --   --    ALKPHOS 103  --   --   --   --   --   --    BILITOT 0.14*  --   --   --   --   --   --    POCGLU  --   --  112* 282* 156* 148* 157*       No results found for: INR, PROTIME    Lab Results   Component Value Date/Time    SPECIAL RAC 09/17/2018 09:26 AM     Lab Results   Component Value Date/Time    CULTURE NO GROWTH 2 DAYS 09/17/2018 09:26 AM       Lab Results   Component Value Date    POCPH 7.39 09/17/2018    POCPCO2 43 09/17/2018    POCPO2 81 09/17/2018    POCHCO3 25.9 09/17/2018    NBEA NOT REPORTED 09/17/2018    PBEA 1 09/17/2018    TCO2ART 27 09/17/2018    POCO2SAT 96 09/17/2018    FIO2 30.0 09/17/2018       Radiology:    Xr Chest Portable    Result Date: 09/17/2018  Borderline cardiac size.  Findings of generalized COPD with appearance of biapical scarring.  Opacity at the right base laterally which may represent focal airspace disease.     Ct Chest Pulmonary Embolism W Contrast    Result Date: 09/17/2018  1.  No evidence of pulmonary embolus. 2.  Severe emphysematous changes in the lungs. 3.  Pulmonary nodules at the right lung base. 4.  Bronchiectasis.  No focal consolidation or effusion. 5.  Atherosclerotic disease. RECOMMENDATIONS: Fleischner Society guidelines for follow-up and management of incidentally detected pulmonary nodules: Multiple Solid Nodules: Nodule size greater than 8 mm In a low-risk patient, CT at 3-6 months, then consider CT at 18-24 months. In a high-risk patient, CT at 3-6 months,  then CT at 18-24 months. - Low risk patients include individuals with minimal or absent history of smoking and other known risk factors. - High risk patients include individuals with a history or smoking or known risk factors. Radiology 2017 http://pubs.https://www.stafford-myers.[email or web address]         All radiological studies reviewed                Code Status:  Full Code    Electronically signed by Phineas Inches, MD on 09/19/2018 at 11:55 AM     Copy sent to Dr. Gwenette Greet, MD    This note was created with the assistance of a speech-recognition program.  Although the intention is to generate a document that actually reflects  the content of the visit, no guarantees can be provided that every mistake has been identified and corrected by editing.     Note was updated later by me after  physical examination and  completion of the assessment.

## 2018-09-19 NOTE — Progress Notes (Signed)
Reason for Consult:  Acute kidney injury.    Interval Hx:    Cr continues to rise today  Afebrile  BP OK  Dyspnea stable on 2L by NC  UOP 950  BNP decreasing      HISTORY OF PRESENT ILLNESS:    The patient is a 85 y.o. female who presents with shortness of breath. She had iv contrast with CTA chest today. No previous labs available in our system for comparison.. On admission she was noted to have elevated creatinine of 1.02 with a potassium level of 5.5. Presently on BiPAP. Denies any problems with nausea, vomiting, appetite, diarrhea or difficulty with urination. Denies any recent use of NSAIDs.    Review Of Systems:   Constitutional: No fever, chills, lethargy, weakness or wt loss.  HEENT:  No headache, nasal discharge or sore throat.  Cardiac:  No chest pain, dyspnea, orthopnea or PND.  Chest:              No cough, phlegm or wheezing.  Abdomen:  No abdominal pain, nausea, vomiting or diarrhea.  Neuro:   No gross focal weakness, numbness, abnormal movements or seizure like activity.  Skin:   No rashes or itching.  GU:   No hematuria, pyuria, dysuria or flank pain.  Extremities:  No swelling or joint pains.  Endocrine: No polyuria, polydypsia, or thyroid problems.  Hematology:    No bleeding disorders, bruising or anemia.  All other ROS is negative.      Past Medical History:   Diagnosis Date   ??? Bladder cancer (HCC)     remission   ??? CHF (congestive heart failure) (HCC)     stage 4   ??? COPD (chronic obstructive pulmonary disease) (HCC)    ??? Neck injury     has injections done for disk issue   ??? Thyroid disease        Past Surgical History:   Procedure Laterality Date   ??? PACEMAKER PLACEMENT      with defibrillator       Prior to Admission medications    Medication Sig Start Date End Date Taking? Authorizing Provider   amiodarone (CORDARONE) 200 MG tablet Take 1 tablet by mouth daily   Yes Historical Provider, MD   ferrous sulfate (IRON 325) 325 (65 Fe) MG tablet Take 1 tablet by mouth daily   Yes Historical  Provider, MD   ipratropium-albuterol (DUONEB) 0.5-2.5 (3) MG/3ML SOLN nebulizer solution Take 1 nebule by mouth every 8 hours as needed   Yes Historical Provider, MD   levothyroxine (SYNTHROID) 25 MCG tablet Take 1 tablet by mouth daily   Yes Historical Provider, MD   lidocaine (LIDODERM) 5 % Place 1 patch onto the skin as needed 11/17/17  Yes Historical Provider, MD   mirtazapine (REMERON) 15 MG tablet Take 15 mg by mouth nightly  08/31/17  Yes Historical Provider, MD   pramipexole (MIRAPEX) 0.25 MG tablet Take 1 tablet by mouth daily as needed  12/30/17  Yes Historical Provider, MD   budesonide-formoterol (SYMBICORT) 160-4.5 MCG/ACT AERO Take 2 puffs by mouth 2 times daily   Yes Historical Provider, MD   albuterol sulfate HFA (PROAIR HFA) 108 (90 Base) MCG/ACT inhaler Take 2 puffs by mouth every 4 hours as needed   Yes Historical Provider, MD   tiotropium (SPIRIVA RESPIMAT) 1.25 MCG/ACT AERS inhaler Take 2 puffs by mouth daily   Yes Historical Provider, MD   spironolactone (ALDACTONE) 25 MG tablet Take 0.5 tablets by mouth  daily   Yes Historical Provider, MD   cyclobenzaprine (FLEXERIL) 10 MG tablet Take 1 tablet by mouth 3 times daily    Historical Provider, MD   silver sulfADIAZINE (SILVADENE) 1 % cream Apply 1 inch topically as needed 01/21/18   Historical Provider, MD   acetaminophen-codeine (TYLENOL/CODEINE #3) 300-30 MG per tablet Take 1 tablet by mouth every 8 hours as needed. 11/17/17   Historical Provider, MD       Scheduled Meds:  ??? furosemide  40 mg Intravenous Daily   ??? ipratropium-albuterol  1 ampule Inhalation Q4H WA   ??? sodium chloride flush  10 mL Intravenous BID   ??? sodium chloride flush  10 mL Intravenous 2 times per day   ??? amiodarone  200 mg Oral Daily   ??? cyclobenzaprine  10 mg Oral TID   ??? ferrous sulfate  325 mg Oral Daily   ??? levothyroxine  25 mcg Oral Daily   ??? mirtazapine  15 mg Oral Nightly   ??? budesonide-formoterol  2 puff Inhalation BID   ??? famotidine  20 mg Oral Daily   ??? heparin (porcine)   5,000 Units Subcutaneous BID   ??? methylPREDNISolone  40 mg Intravenous Q6H   ??? azithromycin  500 mg Intravenous Q24H     Continuous Infusions:  ??? dextrose       PRN Meds:calcium carbonate, nitroGLYCERIN, zolpidem, sodium chloride flush, ipratropium-albuterol, pramipexole, HYDROcodone 5 mg - acetaminophen, acetaminophen **OR** acetaminophen, senna, promethazine **OR** ondansetron, glucose, dextrose, glucagon (rDNA), dextrose    Allergies   Allergen Reactions   ??? Aspirin Other (See Comments)   ??? Penicillins Rash       Social History     Socioeconomic History   ??? Marital status: Married     Spouse name: Not on file   ??? Number of children: Not on file   ??? Years of education: Not on file   ??? Highest education level: Not on file   Occupational History   ??? Not on file   Social Needs   ??? Financial resource strain: Not on file   ??? Food insecurity     Worry: Not on file     Inability: Not on file   ??? Transportation needs     Medical: Not on file     Non-medical: Not on file   Tobacco Use   ??? Smoking status: Former Smoker     Last attempt to quit: 2005     Years since quitting: 15.4   ??? Smokeless tobacco: Never Used   Substance and Sexual Activity   ??? Alcohol use: Never     Frequency: Never   ??? Drug use: Never   ??? Sexual activity: Not on file   Lifestyle   ??? Physical activity     Days per week: Not on file     Minutes per session: Not on file   ??? Stress: Not on file   Relationships   ??? Social Wellsite geologistconnections     Talks on phone: Not on file     Gets together: Not on file     Attends religious service: Not on file     Active member of club or organization: Not on file     Attends meetings of clubs or organizations: Not on file     Relationship status: Not on file   ??? Intimate partner violence     Fear of current or ex partner: Not on file     Emotionally abused: Not  on file     Physically abused: Not on file     Forced sexual activity: Not on file   Other Topics Concern   ??? Not on file   Social History Narrative   ??? Not on file        History reviewed. No pertinent family history.      Physical Exam:  Vitals:    09/19/18 1012 09/19/18 1024 09/19/18 1128 09/19/18 1241   BP:  131/60  (!) 125/49   Pulse:  84  82   Resp:    16   Temp:    97.3 ??F (36.3 ??C)   TempSrc:    Oral   SpO2: 98% 97% 98% 100%   Weight:       Height:         I/O last 3 completed shifts:  In: 840 [P.O.:840]  Out: 950 [Urine:950]    General:  Awake, alert, not in distress. Appears to be stated age.  HEENT: Atraumatic, normocephalic. Anicteric sclera. Pink and moist oral mucosa. No carotid bruit. No JVD.  Chest: Bilateral air entry, clear to auscultation, no wheezing, rhonchi or rales.  Cardiovascular: RRR, S1S2, no murmur, rub or gallop. No lower extremity edema.    Abdomen: Soft, non tender to palpation. Active bowel sounds x 4 quadrants.  Musculoskeletal: Active ROM x 4 extremities. No cyanosis or clubbing.  Integumentary: Pink, warm and dry. Free from rash or lesions. Skin turgor normal.  CNS: Speech clear. Face symmetrical. No tremor.     Data:  CBC:   Lab Results   Component Value Date    WBC 18.6 (H) 09/19/2018    HGB 11.3 (L) 09/19/2018    HCT 35.4 (L) 09/19/2018    MCV 100.6 09/19/2018    PLT 215 09/19/2018     BMP:    Lab Results   Component Value Date    NA 137 09/19/2018    NA 134 (L) 09/18/2018    NA 135 09/17/2018    K 3.9 09/19/2018    K 5.1 09/18/2018    K 4.9 09/17/2018    CL 97 (L) 09/19/2018    CL 96 (L) 09/18/2018    CL 99 09/17/2018    CO2 24 09/19/2018    CO2 23 09/18/2018    CO2 21 09/17/2018    BUN 37 (H) 09/19/2018    BUN 28 (H) 09/18/2018    BUN 25 (H) 09/17/2018    CREATININE 1.33 (H) 09/19/2018    CREATININE 1.17 (H) 09/18/2018    CREATININE 0.96 (H) 09/17/2018    GLUCOSE 201 (H) 09/19/2018    GLUCOSE 132 (H) 09/18/2018    GLUCOSE 79 09/17/2018     CMP:   Lab Results   Component Value Date    NA 137 09/19/2018    K 3.9 09/19/2018    CL 97 09/19/2018    CO2 24 09/19/2018    BUN 37 09/19/2018    CREATININE 1.33 09/19/2018    GLUCOSE 201 09/19/2018     CALCIUM 8.9 09/19/2018    PROT 6.9 09/18/2018    LABALBU 4.1 09/17/2018    BILITOT 0.14 09/17/2018    ALKPHOS 103 09/17/2018    AST 36 09/17/2018    ALT 22 09/17/2018      Hepatic:   Lab Results   Component Value Date    AST 36 (H) 09/17/2018    ALT 22 09/17/2018    BILITOT 0.14 (L) 09/17/2018    ALKPHOS 103 09/17/2018  BNP: No results found for: BNP  Lipids: No results found for: CHOL, HDL  INR: No results found for: INR  PTH: No results found for: PTH  Phosphorus:    Lab Results   Component Value Date    PHOS 3.6 09/19/2018     Ionized Calcium: No results found for: IONCA  Magnesium:   Lab Results   Component Value Date    MG 2.1 09/19/2018     Albumin:   Lab Results   Component Value Date    LABALBU 4.1 09/17/2018     Last 3 CK, CKMB, Troponin: (CKTOTAL:3,CKMB:3,TROPONINI:3)       URINE:)No results found for: Ofilia Neas     Radiology:   Reviewed.    Assessment:  1. Acute kidney injury, appears to be hemodynamically related vs CKD stage 3.   2. Exacerbation of COPD.  3. Hyperkalemia, secondary to AKI, improved.  4. Elevated calcium, improved.    Plan:  1. Holding aldactone for now  2. D/C'd IVF, decrease lasix to 40 po daily due to rising Cr  3. Mild unilateral hydro on [place], consider GU eval if Cr continues to rise  4. Renal diet/TF with oral fluid restriction of 1000 ml/24 hours.   5. Avoid hypotension, nephrotoxic drugs, Lovenox, Fleets enema and IV contrast exposure.  5. Follow up chemistries ordered for later today and for AM.    Thank you for the consultation. Please do not hesitate to contact us for any further questions/concerns. We will continue to follow along with you.       Electronically signed by Phil Dopp, MD  on 09/19/2018 at 2:23 PM   [facility].  Ph: [phone]

## 2018-09-20 LAB — POC GLUCOSE FINGERSTICK
POC Glucose: 132 mg/dL — ABNORMAL HIGH (ref 65–105)
POC Glucose: 142 mg/dL — ABNORMAL HIGH (ref 65–105)
POC Glucose: 146 mg/dL — ABNORMAL HIGH (ref 65–105)
POC Glucose: 87 mg/dL (ref 65–105)

## 2018-09-20 LAB — BRAIN NATRIURETIC PEPTIDE: Pro-BNP: 10678 pg/mL — ABNORMAL HIGH (ref <300)

## 2018-09-20 LAB — CBC WITH AUTO DIFFERENTIAL
Absolute Eos #: 0 10*3/uL (ref 0.0–0.4)
Absolute Immature Granulocyte: 0.18 10*3/uL (ref 0.00–0.30)
Absolute Lymph #: 0.18 10*3/uL — ABNORMAL LOW (ref 1.0–4.8)
Absolute Mono #: 0.71 10*3/uL (ref 0.2–0.8)
Basophils Absolute: 0 10*3/uL (ref 0.0–0.2)
Basophils: 0 %
Eosinophils %: 0 % — ABNORMAL LOW (ref 1–4)
Hematocrit: 37 % (ref 36.3–47.1)
Hemoglobin: 11.8 g/dL — ABNORMAL LOW (ref 11.9–15.1)
Immature Granulocytes: 1 % — ABNORMAL HIGH
Lymphocytes: 1 % — ABNORMAL LOW (ref 24–44)
MCH: 31.9 pg (ref 25.2–33.5)
MCHC: 31.9 g/dL (ref 28.4–34.8)
MCV: 100 fL (ref 82.6–102.9)
MPV: 9.1 fL (ref 8.1–13.5)
Monocytes: 4 % (ref 1–7)
NRBC Automated: 0 per 100 WBC
Platelets: 206 10*3/uL (ref 138–453)
RBC: 3.7 m/uL — ABNORMAL LOW (ref 3.95–5.11)
RDW: 15.9 % — ABNORMAL HIGH (ref 11.8–14.4)
Seg Neutrophils: 94 % — ABNORMAL HIGH (ref 36–66)
Segs Absolute: 16.63 10*3/uL — ABNORMAL HIGH (ref 1.8–7.7)
WBC: 17.7 10*3/uL — ABNORMAL HIGH (ref 3.5–11.3)

## 2018-09-20 LAB — BASIC METABOLIC PANEL
Anion Gap: 15 mmol/L (ref 9–17)
BUN: 46 mg/dL — ABNORMAL HIGH (ref 8–23)
Bun/Cre Ratio: 41 — ABNORMAL HIGH (ref 9–20)
CO2: 27 mmol/L (ref 20–31)
Calcium: 9.4 mg/dL (ref 8.6–10.4)
Chloride: 97 mmol/L — ABNORMAL LOW (ref 98–107)
Creatinine: 1.13 mg/dL — ABNORMAL HIGH (ref 0.50–0.90)
GFR African American: 56 mL/min — ABNORMAL LOW (ref >60)
GFR Non-African American: 46 mL/min — ABNORMAL LOW (ref >60)
Glucose: 168 mg/dL — ABNORMAL HIGH (ref 70–99)
Potassium: 4.2 mmol/L (ref 3.7–5.3)
Sodium: 139 mmol/L (ref 135–144)

## 2018-09-20 LAB — MAGNESIUM: Magnesium: 2.2 mg/dL (ref 1.6–2.6)

## 2018-09-20 LAB — PHOSPHORUS: Phosphorus: 3.3 mg/dL (ref 2.6–4.5)

## 2018-09-20 MED ORDER — METHYLPREDNISOLONE SODIUM SUCC 40 MG IJ SOLR
40 MG | Freq: Three times a day (TID) | INTRAMUSCULAR | Status: DC
Start: 2018-09-20 — End: 2018-09-22
  Administered 2018-09-21 – 2018-09-22 (×5): 40 mg via INTRAVENOUS

## 2018-09-20 MED FILL — LEVOTHYROXINE SODIUM 25 MCG PO TABS: 25 MCG | ORAL | Qty: 1

## 2018-09-20 MED FILL — CYCLOBENZAPRINE HCL 10 MG PO TABS: 10 MG | ORAL | Qty: 1

## 2018-09-20 MED FILL — FAMOTIDINE 20 MG PO TABS: 20 MG | ORAL | Qty: 1

## 2018-09-20 MED FILL — SOLU-MEDROL 40 MG IJ SOLR: 40 MG | INTRAMUSCULAR | Qty: 40

## 2018-09-20 MED FILL — HYDROCODONE-ACETAMINOPHEN 5-325 MG PO TABS: 5-325 MG | ORAL | Qty: 1

## 2018-09-20 MED FILL — AMIODARONE HCL 200 MG PO TABS: 200 MG | ORAL | Qty: 1

## 2018-09-20 MED FILL — SENNA 8.6 MG PO TABS: 8.6 MG | ORAL | Qty: 1

## 2018-09-20 MED FILL — AZITHROMYCIN 500 MG IV SOLR: 500 MG | INTRAVENOUS | Qty: 500

## 2018-09-20 MED FILL — MIRTAZAPINE 15 MG PO TABS: 15 MG | ORAL | Qty: 1

## 2018-09-20 MED FILL — IPRATROPIUM-ALBUTEROL 0.5-2.5 (3) MG/3ML IN SOLN: RESPIRATORY_TRACT | Qty: 3

## 2018-09-20 MED FILL — FERROUS SULFATE 325 (65 FE) MG PO TBEC: 325 (65 Fe) MG | ORAL | Qty: 1

## 2018-09-20 MED FILL — FUROSEMIDE 40 MG PO TABS: 40 MG | ORAL | Qty: 1

## 2018-09-20 MED FILL — HEPARIN SODIUM (PORCINE) 5000 UNIT/ML IJ SOLN: 5000 UNIT/ML | INTRAMUSCULAR | Qty: 1

## 2018-09-20 NOTE — Progress Notes (Signed)
Section of Cardiology  Progress Note      Date:  09/20/2018  Patient: Katherine Cunningham  Admission:  09/17/2018  8:28 AM  Admit DX: CHF (congestive heart failure), NYHA class II, acute, combined (HCC) [I50.41]  Age:  85 y.o., Sep 28, 1932     LOS: 3 days     ACUTE ON CHRONIC SYSTOLIC CHF.  SEVERE LV SYSTOLIC DYSFUNCTION      SUBJECTIVE:     The patient was seen and examined. Notes and labs reviewed.  There were not complications over night.    Patient's cardiac review of systems: positive for fatigue.  The patient is generally feeling unchanged.    OBJECTIVE:      EXAM:   Vitals:    VITALS:  BP (!) 121/54    Pulse 82    Temp 97.3 ??F (36.3 ??C) (Oral)    Resp 16    Ht 5\' 2"  (1.575 m)    Wt 92 lb 3.2 oz (41.8 kg)    SpO2 94%    BMI 16.86 kg/m??   24HR INTAKE/OUTPUT:      Intake/Output Summary (Last 24 hours) at 09/20/2018 1153  Last data filed at 09/20/2018 0603  Gross per 24 hour   Intake 400 ml   Output 1450 ml   Net -1050 ml       CONSTITUTIONAL:  awake, alert, cooperative, no apparent distress, and appears stated age.  HEENT: Normal jugular venous pulsations, no carotid bruits. Head is atraumatic, normocephalic. Eyes and oral mucosa are normal.  LUNGS: Good respiratory effort On auscultation: clear to auscultation bilaterally and rhonchi bilaterally  CARDIOVASCULAR:  Normal apical impulse, regular rate and rhythm, normal S1 and S2, 1-2/6 SM AT APEX.  dorsalis pedis and bilateralpresent 2+  ABDOMEN: Soft, nontender, nondistended. Bowel sounds present. No masses or tenderness. No bruit.  SKIN: Warm and dry.  EXTREMITIES:TRACE.  Current Inpatient Medications:  ??? ipratropium-albuterol  1 ampule Inhalation Q4H WA   ??? furosemide  40 mg Oral Daily   ??? sodium chloride flush  10 mL Intravenous BID   ??? sodium chloride flush  10 mL Intravenous 2 times per day   ??? amiodarone  200 mg Oral Daily   ??? cyclobenzaprine  10 mg Oral TID   ??? ferrous sulfate  325 mg Oral Daily   ??? levothyroxine  25 mcg Oral Daily   ??? mirtazapine  15 mg Oral  Nightly   ??? budesonide-formoterol  2 puff Inhalation BID   ??? famotidine  20 mg Oral Daily   ??? heparin (porcine)  5,000 Units Subcutaneous BID   ??? methylPREDNISolone  40 mg Intravenous Q6H   ??? azithromycin  500 mg Intravenous Q24H       IV Infusions (if any):  ??? dextrose         Diagnostics:   Telemetry:PACED VENTRICULAR RHYTHM.    ECHO:  Summary  Technically difficult study due to patient size and position.  Left ventricle is normal in size  Global left ventricular systolic function is severely reduced  Estimated ejection fraction is 25 % .  Evidence of diastolic dysfunction.  Trivial tricuspid regurgitation.  No pulmonary hypertension.  No pericardial effusion seen.  Normal aortic root dimension.    Labs:   CBC:  Recent Labs     09/19/18  0426 09/20/18  0520   WBC 18.6* 17.7*   HGB 11.3* 11.8*   HCT 35.4* 37.0   PLT 215 206     Magnesium:  Recent Labs  09/19/18  0426 09/20/18  0520   MG 2.1 2.2     BMP:  Recent Labs     09/19/18  0426 09/20/18  0520   NA 137 139   K 3.9 4.2   CALCIUM 8.9 9.4   CO2 24 27   BUN 37* 46*   CREATININE 1.33* 1.13*   LABGLOM 38* 46*   GLUCOSE 201* 168*     BNP:  Recent Labs     09/18/18  0150 09/19/18  0426 09/20/18  0520   PROBNP 14,592* 12,117* 10,678*     PT/INR:No results for input(s): PROTIME, INR in the last 72 hours.  APTT:No results for input(s): APTT in the last 72 hours.  CARDIAC ENZYMES:  Recent Labs     09/17/18  1921 09/18/18  0150 09/19/18  1112   MYOGLOBIN  --   --  140*   CKTOTAL  --   --  145   TROPHS 40* 39* 33*   TROPONINT NOT REPORTED NOT REPORTED NOT REPORTED     FASTING LIPID PANEL:No results found for: HDL, LDLDIRECT, LDLCALC, TRIG  LIVER PROFILE:  Recent Labs     09/18/18  0150   PROT 6.9        ASSESSMENT:  1. ACUTE ON CHRONIC SYSTOLIC CHF.  2. SEVERE LV SYSTOLIC DYSFUNCTION, LVEF 25%.  3. NYHC IV CHF.  4. HX VENTRICULAR TACHCYCARDIA.  5. SEVERE ADVANCED COPD.  6.  ANEMIA.    Patient Active Problem List   Diagnosis   ??? CHF (congestive heart failure), NYHA class  II, acute, combined (HCC)       PLAN:  1. CONTINUE IV FUROSEMIDE WITH DAILY MONITORING OF RENAL FUNCTIONS, AND ADJUST DIURETIC DOSE ACCORDINGLY.  2. CONTINUE CURRENT REGIMEN OF AMIODARONE FOR VENTRICULAR TACHYCARDIA, WITH WHICH VT IS UNDER CONTROL.  3. IN VIEW OF NYHC IV CHF, AND SEVERE COPD, PT'S PROGNOSIS IS VERY POOR.  4. I RECOMMEND PALLIATIVE CARE.            Discussed with patient and nursing.    Raynelle Dick, MD

## 2018-09-20 NOTE — Plan of Care (Signed)
Problem: Falls - Risk of:  Goal: Will remain free from falls  Description: Will remain free from falls  Outcome: Ongoing  Note: Pt fall risk, fall band present, falling star, safety alarm activated and in use as needed. Bed in lowest position, call light within reach. Hourly rounding performed. Pt encouraged to use call light. See Lattie Corns fall risk assessment. Patient offered toileting assistance during rounding. Hourly rounds performed.           Problem: Pain:  Goal: Pain level will decrease  Description: Pain level will decrease  Outcome: Ongoing     Problem: Breathing Pattern - Ineffective:  Goal: Ability to achieve and maintain a regular respiratory rate will improve  Description: Ability to achieve and maintain a regular respiratory rate will improve  Outcome: Ongoing

## 2018-09-20 NOTE — Plan of Care (Signed)
Problem: Falls - Risk of:  Goal: Will remain free from falls  09/20/2018 1305 by Burnis Medin, RN  Outcome: Ongoing  09/20/2018 0101 by Greig Right, RN  Outcome: Ongoing  Note: Pt fall risk, fall band present, falling star, safety alarm activated and in use as needed. Bed in lowest position, call light within reach. Hourly rounding performed. Pt encouraged to use call light. See Lattie Corns fall risk assessment. Patient offered toileting assistance during rounding. Hourly rounds performed.        Goal: Absence of physical injury  Outcome: Ongoing     Problem: Pain:  Goal: Pain level will decrease  09/20/2018 1305 by Burnis Medin, RN  Outcome: Ongoing  09/20/2018 0101 by Greig Right, RN  Outcome: Ongoing  Goal: Control of acute pain  Outcome: Ongoing  Goal: Control of chronic pain  Outcome: Ongoing     Problem: Breathing Pattern - Ineffective:  Goal: Ability to achieve and maintain a regular respiratory rate will improve  09/20/2018 1305 by Burnis Medin, RN  Outcome: Ongoing  09/20/2018 0101 by Greig Right, RN  Outcome: Ongoing     Problem: Respiratory:  Goal: Respiratory status will improve  Outcome: Ongoing     Problem: Skin Integrity:  Goal: Will show no infection signs and symptoms  Outcome: Ongoing  Goal: Absence of new skin breakdown  Outcome: Ongoing

## 2018-09-20 NOTE — Other (Signed)
Dr Donnetta Simpers rounded.

## 2018-09-20 NOTE — Progress Notes (Signed)
Reason for Consult:  Acute kidney injury.    Interval Hx:  Cr better at 1.13 today  Afebrile  BP OK  Dyspnea stable on 2L by NC  UOP 1450  BNP decreasing      HISTORY OF PRESENT ILLNESS:    The patient is a 85 y.o. female who presents with shortness of breath. She had iv contrast with CTA chest today. No previous labs available in our system for comparison.. On admission she was noted to have elevated creatinine of 1.02 with a potassium level of 5.5. Presently on BiPAP. Denies any problems with nausea, vomiting, appetite, diarrhea or difficulty with urination. Denies any recent use of NSAIDs.    Review Of Systems:   Constitutional: No fever, chills, lethargy, weakness or wt loss.  Cardiac:  No chest pain, dyspnea, orthopnea or PND.  Chest:              No cough, phlegm or wheezing.  Abdomen:  No abdominal pain, nausea, vomiting or diarrhea.  GU:   No hematuria, pyuria, dysuria or flank pain.  Extremities:  No swelling or joint pains.  Hematology:    No bleeding disorders, bruising or anemia.  All other ROS is negative.      Past Medical History:   Diagnosis Date   ??? Bladder cancer (HCC)     remission   ??? CHF (congestive heart failure) (HCC)     stage 4   ??? COPD (chronic obstructive pulmonary disease) (HCC)    ??? Neck injury     has injections done for disk issue   ??? Thyroid disease        Past Surgical History:   Procedure Laterality Date   ??? PACEMAKER PLACEMENT      with defibrillator       Prior to Admission medications    Medication Sig Start Date End Date Taking? Authorizing Provider   amiodarone (CORDARONE) 200 MG tablet Take 1 tablet by mouth daily   Yes Historical Provider, MD   ferrous sulfate (IRON 325) 325 (65 Fe) MG tablet Take 1 tablet by mouth daily   Yes Historical Provider, MD   ipratropium-albuterol (DUONEB) 0.5-2.5 (3) MG/3ML SOLN nebulizer solution Take 1 nebule by mouth every 8 hours as needed   Yes Historical Provider, MD   levothyroxine (SYNTHROID) 25 MCG tablet Take 1 tablet by mouth daily   Yes  Historical Provider, MD   lidocaine (LIDODERM) 5 % Place 1 patch onto the skin as needed 11/17/17  Yes Historical Provider, MD   mirtazapine (REMERON) 15 MG tablet Take 15 mg by mouth nightly  08/31/17  Yes Historical Provider, MD   pramipexole (MIRAPEX) 0.25 MG tablet Take 1 tablet by mouth daily as needed  12/30/17  Yes Historical Provider, MD   budesonide-formoterol (SYMBICORT) 160-4.5 MCG/ACT AERO Take 2 puffs by mouth 2 times daily   Yes Historical Provider, MD   albuterol sulfate HFA (PROAIR HFA) 108 (90 Base) MCG/ACT inhaler Take 2 puffs by mouth every 4 hours as needed   Yes Historical Provider, MD   tiotropium (SPIRIVA RESPIMAT) 1.25 MCG/ACT AERS inhaler Take 2 puffs by mouth daily   Yes Historical Provider, MD   spironolactone (ALDACTONE) 25 MG tablet Take 0.5 tablets by mouth daily   Yes Historical Provider, MD   cyclobenzaprine (FLEXERIL) 10 MG tablet Take 1 tablet by mouth 3 times daily    Historical Provider, MD   silver sulfADIAZINE (SILVADENE) 1 % cream Apply 1 inch topically as needed 01/21/18  Historical Provider, MD   acetaminophen-codeine (TYLENOL/CODEINE #3) 300-30 MG per tablet Take 1 tablet by mouth every 8 hours as needed. 11/17/17   Historical Provider, MD       Scheduled Meds:  ??? ipratropium-albuterol  1 ampule Inhalation Q4H WA   ??? furosemide  40 mg Oral Daily   ??? sodium chloride flush  10 mL Intravenous BID   ??? sodium chloride flush  10 mL Intravenous 2 times per day   ??? amiodarone  200 mg Oral Daily   ??? cyclobenzaprine  10 mg Oral TID   ??? ferrous sulfate  325 mg Oral Daily   ??? levothyroxine  25 mcg Oral Daily   ??? mirtazapine  15 mg Oral Nightly   ??? budesonide-formoterol  2 puff Inhalation BID   ??? famotidine  20 mg Oral Daily   ??? heparin (porcine)  5,000 Units Subcutaneous BID   ??? methylPREDNISolone  40 mg Intravenous Q6H   ??? azithromycin  500 mg Intravenous Q24H     Continuous Infusions:  ??? dextrose       PRN Meds:calcium carbonate, nitroGLYCERIN, zolpidem, sodium chloride flush,  ipratropium-albuterol, pramipexole, HYDROcodone 5 mg - acetaminophen, acetaminophen **OR** acetaminophen, senna, promethazine **OR** ondansetron, glucose, dextrose, glucagon (rDNA), dextrose    Allergies   Allergen Reactions   ??? Aspirin Other (See Comments)   ??? Penicillins Rash       Social History     Socioeconomic History   ??? Marital status: Married     Spouse name: Not on file   ??? Number of children: Not on file   ??? Years of education: Not on file   ??? Highest education level: Not on file   Occupational History   ??? Not on file   Social Needs   ??? Financial resource strain: Not on file   ??? Food insecurity     Worry: Not on file     Inability: Not on file   ??? Transportation needs     Medical: Not on file     Non-medical: Not on file   Tobacco Use   ??? Smoking status: Former Smoker     Last attempt to quit: 2005     Years since quitting: 15.4   ??? Smokeless tobacco: Never Used   Substance and Sexual Activity   ??? Alcohol use: Never     Frequency: Never   ??? Drug use: Never   ??? Sexual activity: Not on file   Lifestyle   ??? Physical activity     Days per week: Not on file     Minutes per session: Not on file   ??? Stress: Not on file   Relationships   ??? Social Wellsite geologist on phone: Not on file     Gets together: Not on file     Attends religious service: Not on file     Active member of club or organization: Not on file     Attends meetings of clubs or organizations: Not on file     Relationship status: Not on file   ??? Intimate partner violence     Fear of current or ex partner: Not on file     Emotionally abused: Not on file     Physically abused: Not on file     Forced sexual activity: Not on file   Other Topics Concern   ??? Not on file   Social History Narrative   ??? Not on file  History reviewed. No pertinent family history.      Physical Exam:  Vitals:    09/20/18 0557 09/20/18 0739 09/20/18 1043 09/20/18 1122   BP:  (!) 128/56  (!) 121/54   Pulse:  83  82   Resp:  16  16   Temp:  97.7 ??F (36.5 ??C)  97.3 ??F  (36.3 ??C)   TempSrc:  Oral  Oral   SpO2:  94% 95% 94%   Weight: 92 lb 3.2 oz (41.8 kg)      Height:         I/O last 3 completed shifts:  In: 400 [P.O.:400]  Out: 1450 [Urine:1450]    General:  Awake, alert, not in distress. Appears to be stated age.  HEENT: Atraumatic, normocephalic. Anicteric sclera. Pink and moist oral mucosa. No carotid bruit. No JVD.  Chest: Bilateral air entry, clear to auscultation, no wheezing, rhonchi or rales.  Cardiovascular: RRR, S1S2, no murmur, rub or gallop. No lower extremity edema.    Abdomen: Soft, non tender to palpation. Active bowel sounds x 4 quadrants.  Musculoskeletal: Active ROM x 4 extremities. No cyanosis or clubbing.  Integumentary: Pink, warm and dry. Free from rash or lesions. Skin turgor normal.  CNS: Speech clear. Face symmetrical. No tremor.     Data:  CBC:   Lab Results   Component Value Date    WBC 17.7 (H) 09/20/2018    HGB 11.8 (L) 09/20/2018    HCT 37.0 09/20/2018    MCV 100.0 09/20/2018    PLT 206 09/20/2018     BMP:    Lab Results   Component Value Date    NA 139 09/20/2018    NA 137 09/19/2018    NA 134 (L) 09/18/2018    K 4.2 09/20/2018    K 3.9 09/19/2018    K 5.1 09/18/2018    CL 97 (L) 09/20/2018    CL 97 (L) 09/19/2018    CL 96 (L) 09/18/2018    CO2 27 09/20/2018    CO2 24 09/19/2018    CO2 23 09/18/2018    BUN 46 (H) 09/20/2018    BUN 37 (H) 09/19/2018    BUN 28 (H) 09/18/2018    CREATININE 1.13 (H) 09/20/2018    CREATININE 1.33 (H) 09/19/2018    CREATININE 1.17 (H) 09/18/2018    GLUCOSE 168 (H) 09/20/2018    GLUCOSE 201 (H) 09/19/2018    GLUCOSE 132 (H) 09/18/2018     CMP:   Lab Results   Component Value Date    NA 139 09/20/2018    K 4.2 09/20/2018    CL 97 09/20/2018    CO2 27 09/20/2018    BUN 46 09/20/2018    CREATININE 1.13 09/20/2018    GLUCOSE 168 09/20/2018    CALCIUM 9.4 09/20/2018    PROT 6.9 09/18/2018    LABALBU 4.1 09/17/2018    BILITOT 0.14 09/17/2018    ALKPHOS 103 09/17/2018    AST 36 09/17/2018    ALT 22 09/17/2018      Hepatic:   Lab  Results   Component Value Date    AST 36 (H) 09/17/2018    ALT 22 09/17/2018    BILITOT 0.14 (L) 09/17/2018    ALKPHOS 103 09/17/2018     BNP: No results found for: BNP  Lipids: No results found for: CHOL, HDL  INR: No results found for: INR  PTH: No results found for: PTH  Phosphorus:    Lab Results  Component Value Date    PHOS 3.3 09/20/2018     Ionized Calcium: No results found for: IONCA  Magnesium:   Lab Results   Component Value Date    MG 2.2 09/20/2018     Albumin:   Lab Results   Component Value Date    LABALBU 4.1 09/17/2018     Last 3 CK, CKMB, Troponin: @LABRCNT (CKTOTAL:3,CKMB:3,TROPONINI:3)       URINE:)No results found for: Ofilia NeasNAUR, PROTUR     Radiology:   Reviewed.    Assessment:  1. Acute kidney injury, appears to be hemodynamically related.  2. CKD stage 3.   3. Hyperkalemia, secondary to AKI, improved.  4. Elevated calcium, with elevated vitamin D level, improved.  5. Exacerbation of COPD.    Plan:  1. Holding aldactone for now. Avoid vitamin D.  2. Off IVF, decreased lasix to 40 po daily due to rising Cr  3. Mild unilateral hydro on US, consider GU eval if Cr continues to rise  4. Renal diet with oral fluid restriction of 1000 ml/24 hours.   5. Avoid hypotension, nephrotoxic drugs, Lovenox, Fleets enema and IV contrast exposure.  5. Follow up chemistries ordered for AM.    Thank you for the consultation. Please do not hesitate to contact us for any further questions/concerns. We will continue to follow along with you.       Electronically signed by Margret ChanceZafar M Neya Creegan, MD  on 09/20/2018 at 1:26 PM   [facility].  Ph: [phone]

## 2018-09-20 NOTE — Progress Notes (Signed)
Occupational Therapy  Facility/Department: [facility]  Daily Treatment Note  NAME: Fontaine NoDorothy Sampley  DOB: 11-May-1932  MRN: [id number]    Date of Service: 09/20/2018    Discharge Recommendations:  Subacute/Skilled Nursing Facility       Assessment   Performance deficits / Impairments: Decreased functional mobility ;Decreased ADL status;Decreased strength;Decreased safe awareness;Decreased balance;Decreased cognition;Decreased endurance;Decreased posture  Prognosis: Good  REQUIRES OT FOLLOW UP: Yes  Activity Tolerance  Activity Tolerance: Patient limited by fatigue  Activity Tolerance: P+  Safety Devices  Safety Devices in place: Yes  Type of devices: Patient at risk for falls;Bed alarm in place;Left in bed;Call light within reach;Nurse notified;Gait belt         Patient Diagnosis(es): The primary encounter diagnosis was COPD exacerbation (HCC). Diagnoses of Chronic systolic congestive heart failure (HCC), Shortness of breath, and Hypoxia were also pertinent to this visit.      has a past medical history of Bladder cancer (HCC), CHF (congestive heart failure) (HCC), COPD (chronic obstructive pulmonary disease) (HCC), Neck injury, and Thyroid disease.   has a past surgical history that includes pacemaker placement.    Restrictions  Restrictions/Precautions  Restrictions/Precautions: Up as Tolerated, General Precautions, Fall Risk  Required Braces or Orthoses?: No  Position Activity Restriction  Other position/activity restrictions: Telemetry, up with assist using RW, O2 NC  Subjective   General  Chart Reviewed: Yes  Patient assessed for rehabilitation services?: Yes  Response to previous treatment: Patient with no complaints from previous session  Family / Caregiver Present: No      Orientation  Orientation  Overall Orientation Status: Within Functional Limits  Objective    ADL  Grooming: Setup;Independent(seated)  UE Bathing: Setup;Stand by assistance  LE Bathing: Setup;Minimal assistance  UE Dressing:  Setup;Minimal assistance(to change gowns)  LE Dressing: Setup;Minimal assistance  Toileting: Maximum assistance(for brief change)  Additional Comments: Pt showered this date, pt gets SOB easily and req's rest breaks throughout session. Verbal cues for pursed lip breathing tech.        Balance  Sitting Balance: Supervision  Standing Balance: Contact guard assistance  Functional Mobility  Functional - Mobility Device: Rolling Walker  Activity: To/from bathroom  Assist Level: Contact guard assistance  Functional Mobility Comments: Verbal cues for safety and for safe use of walker.  Engineer, manufacturinghower Transfers  Shower - Transfer From: Adult nurseWalker  Shower - Transfer Type: To and From  Shower - Transfer To: Advertising account plannerTransfer tub bench  Shower - Technique: Ambulating  Shower Transfers: Art therapistContact Guard  Shower Transfers Comments: Verbal cues for safety and use of grab bar.  Bed mobility  Supine to Sit: Minimal assistance  Sit to Supine: Minimal assistance  Scooting: Stand by assistance  Comment: Verbal cues for use of bed rail and overall safety/technique.  Transfers  Stand Step Transfers: Contact guard assistance  Sit to stand: Contact guard assistance  Stand to sit: Contact guard assistance  Transfer Comments: Verbal cues for safety/hand placement and for safe use of walker.                       Cognition  Overall Cognitive Status: Exceptions  Arousal/Alertness: Appropriate responses to stimuli  Following Commands: Follows one step commands with repetition  Attention Span: Appears intact  Memory: Decreased recall of recent events  Safety Judgement: Decreased awareness of need for safety;Decreased awareness of need for assistance  Problem Solving: Assistance required to generate solutions;Assistance required to implement solutions;Decreased awareness of errors;Assistance required to correct errors made  Insights: Decreased awareness of deficits  Initiation: Requires cues for some  Sequencing: Requires cues for some     Perception  Overall  Perceptual Status: [facility]                                   Plan   Plan  Times per week: 4-5x/week, 1-2x/day  Current Treatment Recommendations: Strengthening, Balance Training, Functional Mobility Training, Endurance Training, Psychologist, educational, Equipment Evaluation, Education, Sports administrator, Equities trader, Self-Care / ADL, Art therapist, Patent attorney, Positioning           AM-PAC Score        AM-PAC Inpatient Daily Activity Raw Score: 21 (09/20/18 1119)  AM-PAC Inpatient ADL T-Scale Score : 44.27 (09/20/18 1119)  ADL Inpatient CMS 0-100% Score: 32.79 (09/20/18 1119)  ADL Inpatient CMS G-Code Modifier : CJ (09/20/18 1119)    Goals  Short term goals  Time Frame for Short term goals: by discharge, pt will  Short term goal 1: demo SBA with ADL transfers with good safety, AD/DME as needed  Short term goal 2: demo SBA with functional mob in room with good safety for ADL completion with good safety/pacing and approp aD  Short term goal 3: demo SBA with UB ADLs and min A LB ADLs with good safety/pacing, DME as needed  Short term goal 4: demo CGA with toileting routine with good safety  Short term goal 5: demo and verb good understanding of fall prevention techs, EC/WS techs, B UE HEP, and possible equip needs   Patient Goals   Patient goals : to go  home when able       Therapy Time   Individual Concurrent Group Co-treatment   Time In 0927         Time Out 1020         Minutes [address], OTA

## 2018-09-20 NOTE — Progress Notes (Signed)
Pulmonary Critical Care Progress Note    Patient seen for the follow up of <principal problem not specified>     Subjective:    She has been off BiPAP.  She still on oxygen at 2 L nasal cannula.  She is on oxygen at night at home.  She has improved shortness of breath.  She has occasional cough with mild sputum production.  She has tolerated oral intake.  She reports chronic neck pain.    Examination:    Vitals: BP (!) 106/49    Pulse 83    Temp 97.5 ??F (36.4 ??C) (Oral)    Resp 18    Ht 5\' 2"  (1.575 m)    Wt 92 lb 3.2 oz (41.8 kg)    SpO2 94%    BMI 16.86 kg/m??   SpO2  Avg: 94.7 %  Min: 94 %  Max: 96 %  General appearance: alert and cooperative with exam  Neck: No JVD  Lungs: Decreased breath sounds mild bilateral wheezing  Heart: regular rate and rhythm, S1, S2 normal, no gallop  Abdomen: Soft, non tender, + BS  Extremities: no cyanosis or clubbing. No significant edema    LABs:    CBC:   Recent Labs     09/19/18  0426 09/20/18  0520   WBC 18.6* 17.7*   HGB 11.3* 11.8*   HCT 35.4* 37.0   PLT 215 206     BMP:   Recent Labs     09/19/18  0426 09/20/18  0520   NA 137 139   K 3.9 4.2   CO2 24 27   BUN 37* 46*   CREATININE 1.33* 1.13*   LABGLOM 38* 46*   GLUCOSE 201* 168*    13:17   Ref. Range 09/19/2018 04:26   Pro-BNP Latest Ref Range: <300 pg/mL 12,117 (H)   BNP Interpretation Unknown Pro-BNP Reference Range:   Results for Fontaine NoDEETER, Katherine (MRN [id number]) as of 09/20/2018 16:33   Ref. Range 09/20/2018 05:20   Pro-BNP Latest Ref Range: <300 pg/mL 10,678 (H)   Results for Fontaine NoDEETER, Katherine (MRN [id number]) as of 09/20/2018 16:33   Ref. Range 09/19/2018 11:12   Procalcitonin Latest Ref Range: <0.09 ng/mL 0.11 (H)     Radiology:  Chest x-ray 5/24  A few vague opacities in the bases favor atelectasis.   ??   Chronic changes.         Echocardiogram  Technically difficult study due to patient size and position.  Left ventricle is normal in size  Global left ventricular systolic function is severely reduced  Estimated ejection fraction is  25 % .  Evidence of diastolic dysfunction.  Trivial tricuspid regurgitation.  No pulmonary hypertension.  No pericardial effusion seen.  Normal aortic root dimension    Impression:    ?? Acute on chronic hypoxic respiratory failure requiring BiPAP therapy  ?? Acute exacerbation of COPD/severe emphysema/COVID-19 negative  ?? Pulmonary nodules, sub-centimeter   ?? Pulmonary edema  ?? Elevated d-dimer, CTA negative for pulmonary embolism  ?? Nutcracker esophagus/atypical chest pain  ?? AKI/hyperkalemia  ?? CAD, AICD, CHF    Recommendations:    ??  oxygen by nasal cannula  ?? BiPAP support PRN  ?? Incentive spirometry every hour while awake  ?? DuoNeb by nebulizer every 4 hours as awake  ?? Symbicort 160 twice daily  ??  IV  Zithromax  ?? Monitor creatinine/diuretics per nephrology  ?? Make IV solu-medrol 40 mg every 8 hours  ?? Lower extremity venous  Dopplers  ?? Pain control  ?? Cardiology evaluation  ?? Physical therapy  ?? Rehab discharge evaluation  ?? Discussed with RN and RT  ?? DVT prophylaxis with low molecular weight heparin    Ardelle Anton, MD, [facility]  Pulmonary Critical Care and Sleep Medicine,  [facility]  Cell: [phone]  Office: [phone]

## 2018-09-20 NOTE — Progress Notes (Signed)
Progress note  [facility]    Adult Hospitalist      Name: Katherine Cunningham  MRN: [id number]     Acct: [id number]  Room: 09876543211020/1020-01    Admit Date: 09/17/2018  8:28 AM  PCP: Gwenette Greetalla, Rekha, MD    Primary Problem  Active Problems:    CHF (congestive heart failure), NYHA class II, acute, combined (HCC)  Resolved Problems:    * No resolved hospital problems. *        Assesment:     ?? Acute combined systolic and diastolic CHF  ?? Elevated troponin  ?? Acute COPD exacerbation  ?? Acute respiratory insufficiency  ?? Right lung base pulmonary nodule  ?? Hyperkalemia  ?? Acute renal insufficiency  ?? Hypothyroidism  ?? Cardiomyopathy status post AICD  ?? History of bladder cancer        Plan:     ?? Patient is on step down unit  ?? O2 to maintain oxygen saturation greater than 92%  ?? Serial troponin elevated  ?? Echocardiogram shows EF 25% and diastolic dysfunction  ?? IV Zithromax   ?? holding Aldactone  ?? Decrease Lasix to 40 mg p.o. daily  ?? IV Solu-Medrol  ?? Duo nebs  ?? Continue amiodarone  ?? Continue Synthroid, Remeron, Mirapex  ?? Cardiology consult, input noted  ?? Pulmonology consult, input noted  ?? Nephrology consult, input noted  ?? DVT and GI prophylaxis.        Chief Complaint:     Chief Complaint   Patient presents with   ??? Shortness of Breath     unable to crae for self at home         History of Present Illness:      Patient seen and examined at bedside.  Shortness of breath is improved  potassium improved  No acute events overnight   afebrile   Denies any  headache, dizziness, cough, cold, changes in urination or bowel habits    HPI:    Katherine Cunningham is a 85 y.o.  female who presents with Shortness of Breath (unable to crae for self at home)    85 year old lady with past medical history of CHF, coronary disease status post AICD, COPD, hypothyroidism presented to ER complaining of shortness of breath.  Patient lives at home with her husband who has dementia.  Patient noticed to have progressive shortness of breath with  worsening in the past 24 hours.  Patient denies any fever, chills, cough, nausea, vomiting, change urination, bowel habit.  Patient uses 2 L home O2 at night.  On presentation to ER sheWas tachypneic with respiratory rate in 34 and was requiring 2 to 4 L to maintain her oxygen saturation.  There is a concern by paramedics that patient is probably not able to take care of herself at home.On presentation her BNP was 8642, her troponin was 40, her potassium was 5.5, creatinine was 1.02.  Her LDH was 310, ferritin was 269, d-dimer was 3.12.  Her WBC was 12,000.  Her COVID test was negative.  She has a CTA chest which was negative for PE but shows severe emphysematous changes, pulmonary nodules at the right lung base, bronchiectasis..  Patient admitted with cardiology and pulmonology consult for further management.  I have personally reviewed the past medical history, past surgical history, medications, social history, and family history, and summarized in the note.    Review of Systems:     All 10 point system is reviewed and negative otherwise mentioned  in HPI.      Past Medical History:     Past Medical History:   Diagnosis Date   ??? Bladder cancer (HCC)     remission   ??? CHF (congestive heart failure) (HCC)     stage 4   ??? COPD (chronic obstructive pulmonary disease) (HCC)    ??? Neck injury     has injections done for disk issue   ??? Thyroid disease         Past Surgical History:     Past Surgical History:   Procedure Laterality Date   ??? PACEMAKER PLACEMENT      with defibrillator        Medications Prior to Admission:       Prior to Admission medications    Medication Sig Start Date End Date Taking? Authorizing Provider   amiodarone (CORDARONE) 200 MG tablet Take 1 tablet by mouth daily   Yes Historical Provider, MD   ferrous sulfate (IRON 325) 325 (65 Fe) MG tablet Take 1 tablet by mouth daily   Yes Historical Provider, MD   ipratropium-albuterol (DUONEB) 0.5-2.5 (3) MG/3ML SOLN nebulizer solution Take 1 nebule by  mouth every 8 hours as needed   Yes Historical Provider, MD   levothyroxine (SYNTHROID) 25 MCG tablet Take 1 tablet by mouth daily   Yes Historical Provider, MD   lidocaine (LIDODERM) 5 % Place 1 patch onto the skin as needed 11/17/17  Yes Historical Provider, MD   mirtazapine (REMERON) 15 MG tablet Take 15 mg by mouth nightly  08/31/17  Yes Historical Provider, MD   pramipexole (MIRAPEX) 0.25 MG tablet Take 1 tablet by mouth daily as needed  12/30/17  Yes Historical Provider, MD   budesonide-formoterol (SYMBICORT) 160-4.5 MCG/ACT AERO Take 2 puffs by mouth 2 times daily   Yes Historical Provider, MD   albuterol sulfate HFA (PROAIR HFA) 108 (90 Base) MCG/ACT inhaler Take 2 puffs by mouth every 4 hours as needed   Yes Historical Provider, MD   tiotropium (SPIRIVA RESPIMAT) 1.25 MCG/ACT AERS inhaler Take 2 puffs by mouth daily   Yes Historical Provider, MD   spironolactone (ALDACTONE) 25 MG tablet Take 0.5 tablets by mouth daily   Yes Historical Provider, MD   cyclobenzaprine (FLEXERIL) 10 MG tablet Take 1 tablet by mouth 3 times daily    Historical Provider, MD   silver sulfADIAZINE (SILVADENE) 1 % cream Apply 1 inch topically as needed 01/21/18   Historical Provider, MD   acetaminophen-codeine (TYLENOL/CODEINE #3) 300-30 MG per tablet Take 1 tablet by mouth every 8 hours as needed. 11/17/17   Historical Provider, MD        Allergies:       Aspirin and Penicillins    Social History:     Tobacco:    reports that she quit smoking about 15 years ago. She has never used smokeless tobacco.  Alcohol:      reports no history of alcohol use.  Drug Use:  reports no history of drug use.    Family History:     History reviewed. No pertinent family history.      Physical Exam:     Vitals:  BP (!) 121/54    Pulse 82    Temp 97.3 ??F (36.3 ??C) (Oral)    Resp 16    Ht  (1.575 m)    Wt 92 lb 3.2 oz (41.8 kg)    SpO2 94%    BMI 16.86 kg/m??   Temp (24hrs),  Avg:97.6 ??F (36.4 ??C), Min:97.3 ??F (36.3 ??C), Max:98.4 ??F (36.9  ??C)          General appearance - alert, frail, and in no moderate distress  Mental status - oriented to person, place, and time with normal affect  Head - normocephalic and atraumatic  Eyes - pupils equal and reactive, extraocular eye movements intact, conjunctiva clear  Ears - hearing appears to be intact  Nose - no drainage noted  Mouth - mucous membranes moist  Neck - supple, no carotid bruits, thyroid not palpable  Chest -basal crackles, wheezing, increased effort  Heart - normal rate, regular rhythm, no murmur  Abdomen - soft, nontender, nondistended, bowel sounds present all four quadrants, no masses, hepatomegaly or splenomegaly  Neurological - normal speech, no focal findings or movement disorder noted, cranial nerves II through XII grossly intact  Extremities -2+ peripheral edema  Skin - no gross lesions, rashes, or induration noted        Data:     Labs:    Hematology:  Recent Labs     09/18/18  0150 09/19/18  0426 09/20/18  0520   WBC 10.8 18.6* 17.7*   RBC 3.91* 3.52* 3.70*   HGB 12.7 11.3* 11.8*   HCT 39.4 35.4* 37.0   MCV 100.8 100.6 100.0   MCH 32.5 32.1 31.9   MCHC 32.2 31.9 31.9   RDW 15.6* 15.8* 15.9*   PLT 267 215 206   MPV 9.7 9.3 9.1     Chemistry:  Recent Labs     09/17/18  1921 09/18/18  0150 09/19/18  0426 09/19/18  1112 09/20/18  0520   NA  --  134* 137  --  139   K  --  5.1 3.9  --  4.2   CL  --  96* 97*  --  97*   CO2  --  23 24  --  27   GLUCOSE  --  132* 201*  --  168*   BUN  --  28* 37*  --  46*   CREATININE  --  1.17* 1.33*  --  1.13*   MG  --  2.1 2.1  --  2.2   ANIONGAP  --  15 16  --  15   LABGLOM  --  44* 38*  --  46*   GFRAA  --  54* 46*  --  56*   CALCIUM  --  9.3 8.9  --  9.4   PHOS  --  4.8* 3.6  --  3.3   PROBNP  --  14,592* 12,117*  --  10,678*   TROPHS 40* 39*  --  33*  --    CKTOTAL  --   --   --  145  --    MYOGLOBIN  --   --   --  140*  --      Recent Labs     09/18/18  0150  09/18/18  1700 09/18/18  2034 09/19/18  0733 09/19/18  1205 09/19/18  1627 09/19/18  2022   PROT  6.9  --   --   --   --   --   --   --    POCGLU  --    < > 156* 148* 157* 157* 123* 132*    < > = values in this interval not displayed.       No results found for: INR, PROTIME    Lab Results   Component Value  Date/Time    SPECIAL RAC 09/17/2018 09:26 AM     Lab Results   Component Value Date/Time    CULTURE NO GROWTH 3 DAYS 09/17/2018 09:26 AM       Lab Results   Component Value Date    POCPH 7.39 09/17/2018    POCPCO2 43 09/17/2018    POCPO2 81 09/17/2018    POCHCO3 25.9 09/17/2018    NBEA NOT REPORTED 09/17/2018    PBEA 1 09/17/2018    TCO2ART 27 09/17/2018    POCO2SAT 96 09/17/2018    FIO2 30.0 09/17/2018       Radiology:    Xr Chest Portable    Result Date: 09/17/2018  Borderline cardiac size.  Findings of generalized COPD with appearance of biapical scarring.  Opacity at the right base laterally which may represent focal airspace disease.     Ct Chest Pulmonary Embolism W Contrast    Result Date: 09/17/2018  1.  No evidence of pulmonary embolus. 2.  Severe emphysematous changes in the lungs. 3.  Pulmonary nodules at the right lung base. 4.  Bronchiectasis.  No focal consolidation or effusion. 5.  Atherosclerotic disease. RECOMMENDATIONS: Fleischner Society guidelines for follow-up and management of incidentally detected pulmonary nodules: Multiple Solid Nodules: Nodule size greater than 8 mm In a low-risk patient, CT at 3-6 months, then consider CT at 18-24 months. In a high-risk patient, CT at 3-6 months, then CT at 18-24 months. - Low risk patients include individuals with minimal or absent history of smoking and other known risk factors. - High risk patients include individuals with a history or smoking or known risk factors. Radiology 2017 http://pubs.https://www.stafford-myers.com/.1610960454         All radiological studies reviewed                Code Status:  Full Code    Electronically signed by Phineas Inches, MD on 09/20/2018 at 3:30 PM     Copy sent to Dr. Gwenette Greet, MD    This note was  created with the assistance of a speech-recognition program.  Although the intention is to generate a document that actually reflects the content of the visit, no guarantees can be provided that every mistake has been identified and corrected by editing.     Note was updated later by me after  physical examination and  completion of the assessment.

## 2018-09-21 LAB — ELECTROPHORESIS PROTEIN, SERUM
Albumin %: 62 % (ref 45–65)
Albumin (calculated): 4.3 g/dL (ref 3.2–5.2)
Alpha 1 %: 4 % (ref 3–6)
Alpha 2 %: 13 % (ref 6–13)
Alpha-1-Globulin: 0.3 g/dL (ref 0.1–0.4)
Alpha-2-Globulin: 0.9 g/dL (ref 0.5–0.9)
Beta Globulin: 0.7 g/dL (ref 0.5–1.1)
Beta Percent: 11 % (ref 11–19)
Gamma Globulin %: 11 % (ref 9–20)
Gamma Globulin: 0.7 g/dL (ref 0.5–1.5)
Protein Electrophoresis, Serum: NORMAL
Total Prot. Sum,%: 101 % (ref 98–102)
Total Prot. Sum: 6.9 g/dL (ref 6.3–8.2)
Total Protein: 6.9 g/dL (ref 6.4–8.3)

## 2018-09-21 LAB — IMMUNOFIXATION URINE RANDOM PROFILE: Urine Total Protein: 16 mg/dL

## 2018-09-21 LAB — POC GLUCOSE FINGERSTICK
POC Glucose: 146 mg/dL — ABNORMAL HIGH (ref 65–105)
POC Glucose: 102 mg/dL (ref 65–105)
POC Glucose: 167 mg/dL — ABNORMAL HIGH (ref 65–105)
POC Glucose: 140 mg/dL — ABNORMAL HIGH (ref 65–105)

## 2018-09-21 LAB — BASIC METABOLIC PANEL
Anion Gap: 14 mmol/L (ref 9–17)
BUN: 42 mg/dL — ABNORMAL HIGH (ref 8–23)
Bun/Cre Ratio: 44 — ABNORMAL HIGH (ref 9–20)
CO2: 30 mmol/L (ref 20–31)
Calcium: 9.6 mg/dL (ref 8.6–10.4)
Chloride: 96 mmol/L — ABNORMAL LOW (ref 98–107)
Creatinine: 0.96 mg/dL — ABNORMAL HIGH (ref 0.50–0.90)
GFR African American: >60 mL/min (ref >60)
GFR Non-African American: 56 mL/min — ABNORMAL LOW (ref >60)
Glucose: 92 mg/dL (ref 70–99)
Potassium: 4 mmol/L (ref 3.7–5.3)
Sodium: 140 mmol/L (ref 135–144)

## 2018-09-21 LAB — CBC WITH AUTO DIFFERENTIAL
Absolute Eos #: 0 10*3/uL (ref 0.00–0.44)
Absolute Immature Granulocyte: 0.16 10*3/uL (ref 0.00–0.30)
Absolute Lymph #: 0.32 10*3/uL — ABNORMAL LOW (ref 1.10–3.70)
Absolute Mono #: 0.95 10*3/uL (ref 0.10–1.20)
Basophils Absolute: 0 10*3/uL (ref 0.00–0.20)
Basophils: 0 % (ref 0–2)
Eosinophils %: 0 % — ABNORMAL LOW (ref 1–4)
Hematocrit: 38.8 % (ref 36.3–47.1)
Hemoglobin: 12.2 g/dL (ref 11.9–15.1)
Immature Granulocytes: 1 % — ABNORMAL HIGH
Lymphocytes: 2 % — ABNORMAL LOW (ref 24–43)
MCH: 31.9 pg (ref 25.2–33.5)
MCHC: 31.4 g/dL (ref 28.4–34.8)
MCV: 101.3 fL (ref 82.6–102.9)
MPV: 9.8 fL (ref 8.1–13.5)
Monocytes: 6 % (ref 3–12)
NRBC Automated: 0 per 100 WBC
Platelets: 220 10*3/uL (ref 138–453)
RBC: 3.83 m/uL — ABNORMAL LOW (ref 3.95–5.11)
RDW: 15.9 % — ABNORMAL HIGH (ref 11.8–14.4)
Seg Neutrophils: 91 % — ABNORMAL HIGH (ref 36–65)
Segs Absolute: 14.47 10*3/uL — ABNORMAL HIGH (ref 1.50–8.10)
WBC: 15.9 10*3/uL — ABNORMAL HIGH (ref 3.5–11.3)

## 2018-09-21 LAB — EKG 12-LEAD
Atrial Rate: 89 {beats}/min
P Axis: 78 degrees
P-R Interval: 146 ms
Q-T Interval: 478 ms
QRS Duration: 172 ms
QTc Calculation (Bazett): 581 ms
R Axis: -162 degrees
T Axis: 30 degrees
Ventricular Rate: 89 {beats}/min

## 2018-09-21 LAB — BRAIN NATRIURETIC PEPTIDE: Pro-BNP: 15894 pg/mL — ABNORMAL HIGH (ref <300)

## 2018-09-21 LAB — IMMUNOFIXATION SERUM PROFILE: Serum IFX Interp: NEGATIVE

## 2018-09-21 LAB — PROTEIN ELECTROPHORESIS, URINE: Urine Total Protein: 16 mg/dL

## 2018-09-21 LAB — MAGNESIUM: Magnesium: 2.3 mg/dL (ref 1.6–2.6)

## 2018-09-21 LAB — PHOSPHORUS: Phosphorus: 2.9 mg/dL (ref 2.6–4.5)

## 2018-09-21 MED ORDER — LIDOCAINE 4 % EX PTCH
4 % | Freq: Every day | CUTANEOUS | Status: DC
Start: 2018-09-21 — End: 2018-09-25
  Administered 2018-09-21 – 2018-09-24 (×4): 1 via TRANSDERMAL

## 2018-09-21 MED FILL — HYDROCODONE-ACETAMINOPHEN 5-325 MG PO TABS: 5-325 MG | ORAL | Qty: 1

## 2018-09-21 MED FILL — IPRATROPIUM-ALBUTEROL 0.5-2.5 (3) MG/3ML IN SOLN: RESPIRATORY_TRACT | Qty: 3

## 2018-09-21 MED FILL — SENNA 8.6 MG PO TABS: 8.6 MG | ORAL | Qty: 1

## 2018-09-21 MED FILL — CYCLOBENZAPRINE HCL 10 MG PO TABS: 10 MG | ORAL | Qty: 1

## 2018-09-21 MED FILL — FUROSEMIDE 40 MG PO TABS: 40 MG | ORAL | Qty: 1

## 2018-09-21 MED FILL — HEPARIN SODIUM (PORCINE) 5000 UNIT/ML IJ SOLN: 5000 UNIT/ML | INTRAMUSCULAR | Qty: 1

## 2018-09-21 MED FILL — SOLU-MEDROL 40 MG IJ SOLR: 40 MG | INTRAMUSCULAR | Qty: 40

## 2018-09-21 MED FILL — ASPERCREME LIDOCAINE 4 % EX PTCH: 4 % | CUTANEOUS | Qty: 1

## 2018-09-21 MED FILL — FAMOTIDINE 20 MG PO TABS: 20 MG | ORAL | Qty: 1

## 2018-09-21 MED FILL — MIRTAZAPINE 15 MG PO TABS: 15 MG | ORAL | Qty: 1

## 2018-09-21 MED FILL — LEVOTHYROXINE SODIUM 25 MCG PO TABS: 25 MCG | ORAL | Qty: 1

## 2018-09-21 MED FILL — AMIODARONE HCL 200 MG PO TABS: 200 MG | ORAL | Qty: 1

## 2018-09-21 MED FILL — FERROUS SULFATE 325 (65 FE) MG PO TBEC: 325 (65 Fe) MG | ORAL | Qty: 1

## 2018-09-21 MED FILL — AZITHROMYCIN 500 MG IV SOLR: 500 MG | INTRAVENOUS | Qty: 500

## 2018-09-21 NOTE — Progress Notes (Signed)
ANTIMICROBIAL STEWARDSHIP  Upon review of the patient's chart, the committee has the following recommendation for your consideration:    Indication: AECOPD  Day of Antimicrobial Therapy: 5    Recommendation:  Patient will complete 5 days of antibiotic therapy for AECOPD today. (today's dose is scheduled at 1200)  Elevated WBC is likely due to steroid therapy.     Please discontinue azithromycin after today's dose.       Recent Labs     09/20/18  0520 09/21/18  0458   WBC 17.7* 15.9*     Recent Labs     09/19/18  1112   PROCAL 0.11*       Unnecessary or inappropriate antimicrobial use increases the risk of microbial resistance and drug-associated toxicities including C. difficile infection.     Thank you.  Katherine Cunningham, PharmD  09/21/2018  10:14 AM    The Antimicrobial Stewardship Committee at [facility]. Katherine Cunningham is led by  Katherine Sia, MD, Infectious Diseases Section Chair.

## 2018-09-21 NOTE — Care Coordination-Inpatient (Addendum)
SW received call from [place] regarding the pt requested referrals to [facility], [facility],  and [facility].  SW faxed referrals    Addendum     SW received calls from [facility] and [facility] regarding both facilities are out of network with pt insurance.  SW will update pt and request more choices.     SW met with pt to discuss other choices for SNF due to the requested SNF are out of network with pt insurance.    Pt is agreeable with referrals to [facility], [facility], and [facility]. SW will fax referrals     SW received call from [facility] regarding this facility not taking any pts this week.      SW received call from [place] with [facility] regarding this facility is out of network with pt insurance.     SW spoke with Larene Beach from [facility] regarding this facility is out of network with pt insurance.

## 2018-09-21 NOTE — Progress Notes (Signed)
Pulmonary Critical Care Progress Note    Patient seen for the follow up of <principal problem not specified>     Subjective:    She has been off BiPAP.  She still on oxygen at 2 L nasal cannula.  She is on oxygen at night at home.  She still have significant shortness of breath on ambulation.  She has occasional cough with mild sputum production.  She has tolerated oral intake.  She feels weak    Examination:    Vitals: BP 119/67    Pulse 87    Temp 97.2 ??F (36.2 ??C) (Oral)    Resp 18    Ht 5\' 2"  (1.575 m)    Wt 84 lb (38.1 kg)    SpO2 94%    BMI 15.36 kg/m??   SpO2  Avg: 93.1 %  Min: 90 %  Max: 97 %  General appearance: alert and cooperative with exam  Neck: No JVD  Lungs: Decreased breath sounds mild bilateral wheezing  Heart: regular rate and rhythm, S1, S2 normal, no gallop  Abdomen: Soft, non tender, + BS  Extremities: no cyanosis or clubbing. No significant edema    LABs:    CBC:   Recent Labs     09/20/18  0520 09/21/18  0458   WBC 17.7* 15.9*   HGB 11.8* 12.2   HCT 37.0 38.8   PLT 206 220     BMP:   Recent Labs     09/20/18  0520 09/21/18  0458   NA 139 140   K 4.2 4.0   CO2 27 30   BUN 46* 42*   CREATININE 1.13* 0.96*   LABGLOM 46* 56*   GLUCOSE 168* 92    13:17   Ref. Range 09/19/2018 04:26   Pro-BNP Latest Ref Range: <300 pg/mL 12,117 (H)   BNP Interpretation Unknown Pro-BNP Reference Range:   Results for PATRICIAJO, WOLDEN (MRN [id number]) as of 09/20/2018 16:33   Ref. Range 09/20/2018 05:20   Pro-BNP Latest Ref Range: <300 pg/mL 10,678 (H)   Results for TIHANNA, LUTFI (MRN [id number]) as of 09/20/2018 16:33   Ref. Range 09/19/2018 11:12   Procalcitonin Latest Ref Range: <0.09 ng/mL 0.11 (H)     Radiology:  Chest x-ray 5/24  A few vague opacities in the bases favor atelectasis.   ??   Chronic changes.         Echocardiogram  Technically difficult study due to patient size and position.  Left ventricle is normal in size  Global left ventricular systolic function is severely reduced  Estimated ejection fraction is 25 %  .  Evidence of diastolic dysfunction.  Trivial tricuspid regurgitation.  No pulmonary hypertension.  No pericardial effusion seen.  Normal aortic root dimension    Impression:    ?? Acute on chronic hypoxic respiratory failure requiring BiPAP therapy  ?? Acute exacerbation of COPD/severe emphysema/COVID-19 negative  ?? Pulmonary nodules, sub-centimeter   ?? Pulmonary edema  ?? Elevated d-dimer, CTA negative for pulmonary embolism  ?? Nutcracker esophagus/atypical chest pain  ?? AKI/hyperkalemia  ?? CAD, AICD, CHF    Recommendations:    ??  oxygen by nasal cannula  ?? BiPAP support PRN  ?? Incentive spirometry every hour while awake  ?? DuoNeb by nebulizer every 4 hours as awake  ?? Symbicort 160 twice daily  ?? Discontinue IV  Zithromax  ?? Chest x-ray in a.m.  ?? Monitor creatinine/Lasix 40 p.o. daily per nephrology  ?? IV solu-medrol 40 mg every 8  hours  ?? Lower extremity venous Dopplers  ?? Cardiology evaluation  ?? Physical therapy  ?? Rehab discharge evaluation  ?? Palliative care on consult  ?? DVT prophylaxis with low molecular weight heparin    Ardelle AntonNader M Jeriah Corkum, MD, [facility]  Pulmonary Critical Care and Sleep Medicine,  [facility]  Cell: [phone]  Office: [phone]

## 2018-09-21 NOTE — Care Coordination-Inpatient (Signed)
Discharge planning    Chart reviewed.  Seen by cardiology and recommending palliative care due to poor prognosis r/t CHF    PT and OT recommending snf.  Son did discuss with CM over weekend and refusing snf.  States patient takes care of her spouse. Referral was made to [facility] at that time.     COC in epic. Patient has 02 with portability, RW, cane in the home.      CAR  ASSESSMENT:  1. ACUTE ON CHRONIC SYSTOLIC CHF.  2. SEVERE LV SYSTOLIC DYSFUNCTION, LVEF 25%.  3. NYHC IV CHF.  PLAN:  1. CONTINUE IV FUROSEMIDE WITH DAILY MONITORING OF RENAL FUNCTIONS, AND ADJUST DIURETIC DOSE ACCORDINGLY.  2. CONTINUE CURRENT REGIMEN OF AMIODARONE FOR VENTRICULAR TACHYCARDIA, WITH WHICH VT IS UNDER CONTROL.  3. IN VIEW OF NYHC IV CHF, AND SEVERE COPD, PT'S PROGNOSIS IS VERY POOR.  4. I RECOMMEND PALLIATIVE CARE.      NEPHRO  Assessment:  1. Acute kidney injury, appears to be hemodynamically related.  2. CKD stage 3.   3. Hyperkalemia, secondary to AKI, improved.  Plan:  1. Holding aldactone for now. Avoid vitamin D.  2. Off IVF, decreased lasix to 40 po daily due to rising Cr  3. Mild unilateral hydro on [place], consider GU eval if Cr continues to rise  4. Renal diet with oral fluid restriction of 1000 ml/24 hours.     PULM  Impression:  ??  ?? Acute on chronic??hypoxic??respiratory failure??requiring??BiPAP therapy  ?? Acute exacerbation of COPD/severe emphysema/COVID-19 negative  ?? Pulmonary nodules, sub-centimeter??  ?? Pulmonary edema  ?? Elevated d-dimer, CTA negative for pulmonary embolism    Recommendations:  ??  ??  oxygen by nasal cannula  ?? BiPAP support PRN  ?? Incentive spirometry every hour while awake  ?? DuoNeb by nebulizer every 4 hours as awake  ?? Symbicort 160 twice daily  ??  IV ??Zithromax  ?? Make IV solu-medrol??40 mg every 8 hours  ?? Lower extremity venous Dopplers

## 2018-09-21 NOTE — Plan of Care (Signed)
Problem: Falls - Risk of:  Goal: Will remain free from falls  Description: Will remain free from falls  09/21/2018 0205 by Clementeen Graham, RN  Outcome: Ongoing  Note: Fall risk assessment completed. Patient instructed to use call light. Bed locked and in lowest position, side rails up 2/4, call light and bedside table within reach, clutter removed, and non-skid footwear on when pt out of bed. Hourly rounds will continue.      Problem: Pain:  Goal: Pain level will decrease  Description: Pain level will decrease  09/21/2018 0205 by Clementeen Graham, RN  Outcome: Ongoing     Problem: Breathing Pattern - Ineffective:  Goal: Ability to achieve and maintain a regular respiratory rate will improve  Description: Ability to achieve and maintain a regular respiratory rate will improve  09/21/2018 0205 by Clementeen Graham, RN  Outcome: Ongoing     Problem: Respiratory:  Goal: Respiratory status will improve  Description: Respiratory status will improve  09/21/2018 0205 by Clementeen Graham, RN  Outcome: Ongoing     Problem: Skin Integrity:  Goal: Will show no infection signs and symptoms  Description: Will show no infection signs and symptoms  09/21/2018 0205 by Clementeen Graham, RN  Outcome: Ongoing     Problem: Skin Integrity:  Goal: Absence of new skin breakdown  Description: Absence of new skin breakdown  09/21/2018 0205 by Clementeen Graham, RN  Outcome: Ongoing

## 2018-09-21 NOTE — Progress Notes (Signed)
[facility] Cardiology  Attending Progress Note    Date:  09/21/2018  Patient: Katherine Cunningham ,  November 15, 1932  Age:  85 y.o.  Admission:  09/17/2018  8:28 AM  Length of stay  LOS: 4 days   Admit DX: CHF (congestive heart failure), NYHA class II, acute, combined (HCC) [I50.41]      Assessment:  1. Acute on chronic, systolic, congestive heart failure  2. Severe dilated cardiomyopathy, EF 25%  3. Ventricular tachycardia, suppressed by amiodarone  4. Advanced COPD, with exacerbation  5. Acute renal insufficiency, nephrology on board      Plan:  Diuresis is being managed by nephrology.  She has been switched to oral Lasix and continues on holding Aldactone.  From a cardiac standpoint there is not much more that can be offered at this stage.  We will continue to follow with you.    ==============================================================    Chief Complaint: Shortness of breath, seen for congestive heart failure    Subjective:   The patient was seen and examined. Notes and labs reviewed.  There were no complications over night.    Patient's cardiac review of systems: Denies chest pain.  Shortness of breath persists but unchanged..  The patient is generally feeling unchanged.    Objective:   BP (!) 146/65    Pulse 86    Temp 97.3 ??F (36.3 ??C) (Oral)    Resp 16    Ht 5\' 2"  (1.575 m)    Wt 84 lb (38.1 kg)    SpO2 90%    BMI 15.36 kg/m??     Intake/Output Summary (Last 24 hours) at 09/21/2018 3567  Last data filed at 09/21/2018 0654  Gross per 24 hour   Intake 780 ml   Output --   Net 780 ml       CONSTITUTIONAL:  awake, alert, cooperative, no apparent distress  HEAD:  Head is atraumatic, normocephalic. Eyes and oral mucosa are normal.  LUNGS: Diminished air exchange bilaterally.  CARDIOVASCULAR: Regular rhythm, normal S1 and S2, and 1/6 systolic murmur at the left sternal border and apex  ABDOMEN:  Soft, nontender, nondistended.   EXTREMITIES: Trace bilaterally lower extremities  NEUROLOGIC: Grossly  nonfocal    Labs:  CBC:  Recent Labs     09/20/18  0520 09/21/18  0458   WBC 17.7* 15.9*   HGB 11.8* 12.2   HCT 37.0 38.8   PLT 206 220     Magnesium:  Recent Labs     09/20/18  0520 09/21/18  0458   MG 2.2 2.3     BMP:  Recent Labs     09/20/18  0520 09/21/18  0458   NA 139 140   K 4.2 4.0   CALCIUM 9.4 9.6   CO2 27 30   BUN 46* 42*   CREATININE 1.13* 0.96*   LABGLOM 46* 56*   GLUCOSE 168* 92     BNP:No results for input(s): BNP in the last 72 hours.  PT/INR:No results for input(s): PROTIME, INR in the last 72 hours.  APTT:No results for input(s): APTT in the last 72 hours.  CARDIAC ENZYMES:  Recent Labs     09/19/18  1112   CKTOTAL 145   TROPHS 33*     FASTING LIPID PANEL:No results found for: HDL, LDLDIRECT, LDLCALC, TRIG  LIVER PROFILE:No results for input(s): AST, ALT, LABALBU, ALKPHOS, BILITOT, BILIDIR, IBILI, PROT, GLOB, ALBUMIN in the last 72 hours.    Current Meds:   ??? methylPREDNISolone  40 mg Intravenous Q8H   ??? ipratropium-albuterol  1 ampule Inhalation Q4H WA   ??? furosemide  40 mg Oral Daily   ??? sodium chloride flush  10 mL Intravenous BID   ??? sodium chloride flush  10 mL Intravenous 2 times per day   ??? amiodarone  200 mg Oral Daily   ??? cyclobenzaprine  10 mg Oral TID   ??? ferrous sulfate  325 mg Oral Daily   ??? levothyroxine  25 mcg Oral Daily   ??? mirtazapine  15 mg Oral Nightly   ??? budesonide-formoterol  2 puff Inhalation BID   ??? famotidine  20 mg Oral Daily   ??? heparin (porcine)  5,000 Units Subcutaneous BID   ??? azithromycin  500 mg Intravenous Q24H     Continuous Infusions:   ??? dextrose         Electronically signed Godfrey Picksama A Al-Bawab, MD 9:39 AM 09/21/2018

## 2018-09-21 NOTE — Plan of Care (Signed)
Problem: Falls - Risk of:  Goal: Will remain free from falls  Description: Will remain free from falls  09/21/2018 1149 by Elinor Parkinson, RN  Outcome: Ongoing  Siderails up x 2  Hourly rounding.  Call light in reach.  Instructed to call for assist before attempting out of bed.  Remains free from falls and accidental injury at this time.   Floor free from obstacles, and bed is locked and in lowest position. Adequate lighting provided.  Bed alarm on. Fall sticker on wristband. Fall Sign posted in doorway       Problem: Falls - Risk of:  Goal: Absence of physical injury  Description: Absence of physical injury  Outcome: Ongoing     Problem: Pain:  Goal: Pain level will decrease  Description: Pain level will decrease  09/21/2018 1149 by Elinor Parkinson, RN  Outcome: Ongoing     Problem: Pain:  Goal: Control of acute pain  Description: Control of acute pain  Outcome: Ongoing     Problem: Pain:  Goal: Control of chronic pain  Description: Control of chronic pain  Outcome: Ongoing     Problem: Breathing Pattern - Ineffective:  Goal: Ability to achieve and maintain a regular respiratory rate will improve  Description: Ability to achieve and maintain a regular respiratory rate will improve  09/21/2018 1149 by Elinor Parkinson, RN  Outcome: Ongoing  Patient wears home O2 2L all the time      Problem: Respiratory:  Goal: Respiratory status will improve  Description: Respiratory status will improve  09/21/2018 1149 by Elinor Parkinson, RN  Outcome: Ongoing     Problem: Skin Integrity:  Goal: Will show no infection signs and symptoms  Description: Will show no infection signs and symptoms  09/21/2018 1149 by Elinor Parkinson, RN  Outcome: Ongoing  Waffle mattress on bed and properly inflated   Q2 turn and as needed   Barrier cream to coccyx   Heels elevated off while in bed and chair        Problem: Skin Integrity:  Goal: Absence of new skin breakdown  Description: Absence of new skin breakdown  09/21/2018 1149 by Elinor Parkinson, RN  Outcome:  Ongoing

## 2018-09-21 NOTE — Progress Notes (Signed)
Physical Therapy  Facility/Department: [facility]  Daily Treatment Note  NAME: Katherine Cunningham  DOB: [birth year]  MRN: [id number]    Date of Service: 09/21/2018    Discharge Recommendations:   Subacute/Skilled Nursing Facility        Assessment   Body structures, Functions, Activity limitations: Decreased functional mobility ;Decreased safe awareness;Decreased endurance;Decreased balance;Decreased strength  Assessment: Pt continues with present with severe decreased aerobic/ cardiac tolerance and limited mobility - stage 4 CHF.  Pt reports she just isn't sure that she will be able to make it at home.    Will progress as tolerated but continue to feel short term SNF would allow for return of normal function.     Specific instructions for Next Treatment: gait training w/ O2;   Prognosis: Good  PT Education: Goals;Plan of Editor, commissioning;Disease Specific Education;PT Role  REQUIRES PT FOLLOW UP: Yes     Patient Diagnosis(es): The primary encounter diagnosis was COPD exacerbation (HCC). Diagnoses of Chronic systolic congestive heart failure (HCC), Shortness of breath, and Hypoxia were also pertinent to this visit.     has a past medical history of Bladder cancer (HCC), CHF (congestive heart failure) (HCC), COPD (chronic obstructive pulmonary disease) (HCC), Neck injury, and Thyroid disease.   has a past surgical history that includes pacemaker placement.    Restrictions  Restrictions/Precautions  Restrictions/Precautions: Up as Tolerated, General Precautions, Fall Risk  Required Braces or Orthoses?: No  Position Activity Restriction  Other position/activity restrictions: Telemetry, up with assist using RW, O2 NC  Subjective   General  Chart Reviewed: Yes  Response To Previous Treatment: Patient with no complaints from previous session.  Family / Caregiver Present: No  Subjective  Subjective: Pt reports she is exhausted from just going to the bathroom.           Orientation x 3     Cognition -  improved from 5/24.      Objective   Bed mobility  Bridging: Independent  Rolling to Left: Modified independent  Rolling to Right: Modified independent  Supine to Sit: Minimal assistance  Transfers  Sit to Stand: Minimal Assistance  Stand to sit: Minimal Assistance  Bed to Chair: Minimal assistance  Ambulation  Ambulation?: Yes  Ambulation 1  Device: Rolling Walker  Assistance: Minimal assistance  Quality of Gait: Pt gait limited by severe dyspnea and cardiac limitations - very tachy w/ activity  Gait Deviations: Staggers;Deviated path;Shuffles(UE support required for balance. )  Distance: 2 ft x 2.   Comments: commode transfer min assist for balance during hygiene.                        Exercise:  bil LE X 10 reps.  Pursed lip breathing x 10 reps.     AM-PAC Score  AM-PAC Inpatient Mobility Raw Score : 17 (09/18/18 1158)  AM-PAC Inpatient T-Scale Score : 42.13 (09/18/18 1158)  Mobility Inpatient CMS 0-100% Score: 50.57 (09/18/18 1158)  Mobility Inpatient CMS G-Code Modifier : CK (09/18/18 1158)     Goals  Short term goals  Time Frame for Short term goals: 12 tx's  Short term goal 1: Bed mobility independnet  Short term goal 2: Transfers independent  Short term goal 3: Amb x 100 ft w/ O2 via n.c. independent w/ rwalker  Short term goal 4: Pt issued HEP for safe home program.   Patient Goals   Patient goals : Pt goal is to get home asap  Plan    Plan  Times per week: daily.  5-6x/week   Specific instructions for Next Treatment: gait training w/ O2;   Current Treatment Recommendations: Strengthening, Location managerBalance Training, Teacher, early years/preTransfer Training, Teaching laboratory technicianndurance Training, Investment banker, operationalGait Training, Home Exercise Program, Equities traderatient/Caregiver Education & Training, Patent attorneyafety Education & Training, Museum/gallery curatortair training, Psychologist, educationalCognitive Reorientation, Ecologistunctional Mobility Training  Safety Devices  Type of devices: All fall risk precautions in place, Positioning belt, Left in bed, Call light within reach     Therapy Time   Individual Concurrent Group Co-treatment    Time In 0952         Time Out 1030         Minutes 38            Teyla Skidgel, PT

## 2018-09-21 NOTE — Consults (Signed)
..    Palliative Care Inpatient Consult    NAME:  Katherine Cunningham  MEDICAL RECORD NUMBER:  [id number]  AGE: 85 y.o.   GENDER: female  DOB: [birth year]  TODAY'S DATE:  09/21/2018    Reasons for Consultation:    Provision of information regarding PC and/or hospice philosophies  Complex, time-intensive communication and interdisciplinary psychosocial support  Clarification of goals of care and/or assistance with difficult decision-making  Guidance in regards to resources and transition(s)    Members of PC team contributing to this consultation are :  Julious Payer, RN    History of Present Illness     The patient is a 85 y.o.  Non-hispanic/non latino female who presents with Shortness of Breath (unable to crae for self at home)    Referred to Palliative Care by   [x]  Physician   []  Nursing  []  Family Request   []  Other:       She was admitted to the primary service for CHF (congestive heart failure), NYHA class II, acute, combined (HCC) [I50.41]. Her hospital course has been associated with <principal problem not specified>. The patient has a complicated medical history and has been hospitalized since 09/17/2018  8:28 AM.    Active Hospital Problems    Diagnosis Date Noted   ??? CHF (congestive heart failure), NYHA class II, acute, combined (HCC) [I50.41] 09/17/2018       Data        Code Status: Full Code     ADVANCED CARE PLANNING:  Patient has capacity for medical decisions: yes  Health Care Power of Attorney: no  Living Will: yes - states she does but none in chart.    Personal, Social, and Family History  Marital Status: married  Living situation:with family:  spouse  Faith/Spiritual History:   Not asked  Psychological Distress: moderate r/t SOB  Does patient understand diagnosis/treatment? yes  Does family/caregiver understand diagnosis/treatment? not asked    Assessment        Palliative Performance Scale:    ___100% Full ambulation; normal activity and work; no evidence of disease; able to do own self care; normal  intake; fully conscious  ___90% Full ambulation; normal activity and work; some evidence of disease; able to do own self care; normal intake; fully conscious  ___80% Full ambulation; normal activity with effort; some evidence of disease; able to do own self care; normal or reduced intake; fully conscious  ___70% Ambulation reduced; unable to perform normal job/work; significant disease; able to do own self care; normal or reduced intake; fully conscious  ___60%  Ambulation reduced; cannot do hobbies/housework; significant disease; occasional assist; intake normal or reduced; fully conscious/some confusion  ___50%  Mainly sit/lie; can't do any work; extensive disease; considerable assist; intake normal or reduced; fully conscious/some confusion  _x__40%  Mainly in bed; extensive disease; mainly assist; intake normal or reduced; fully conscious/ some confusion   ___30%  Bed bound; extensive disease; total care; intake reduced; fully conscious/some confusion  ___20%  Bed bound; extensive disease; total care; intake minimal; drowsy/coma  ___10%  Bed bound; extensive disease; total care; mouth care only; drowsy/coma  ___0       Death       Risk Assessments:    Gagne Risk Score: @GAGNE @    Readmission Risk Score: 18        1 Year Mortality Risk Score: @1YEARMORTALITYRISK @     Plan        Palliative Interaction: Pt resting in bed, alert and  oriented. She states she has pain in her neck which is chronic in nature. She states she has SOB with exertion but denies SOB at rest. Once patient is talking she appears somewhat SOB.     GOALS OF CARE: I talked with patient about her chronic illness. She states she has had COPD for 10-12 years. She states she does understand she has heart disease. We talked about her heart function and what her EF is. Pt states she plans to go home. We discussed the need for rehab according to therapy notes, she states, "I suppose I will have to get some therapy" but in the next sentence states she is  going home. Pt states she lives at home with her husband. It was reported to me that husband has dementia but patient states he can stay by himself but needs to be "checked on occasionally". We did discuss palliative care to follow wherever she goes to help with symptom mgt. Pt not wanting to make any decisions at this time.   Discharge planner updated and she will discuss discharge plan again with patient today.    CODE STATUS: I discussed patient's wishes with her re: intubation/defibrillation/ventilation/CPR. Pt states, "I don't think I want any of that". I explained the 3 different levels of code classifications to her. [facility] Will have Primary service talk with patient again about her wishes and change code status if that is what she decides.     Education/support to patient  Discharge planning/helping to coordinate care  Communications with primary service  Providing support for coping/adaptation/distress of patient  Discussing meaning/purpose   Continue with current plan of care  Clarification of medical condition to patient and family  Code status clarified: Full Code  Discussed option of palliative care to follow wherever she goes at discharge to help with symptom mgt.    Primary to address code status with patient as she has told me she does not want CPR/intubation/Ventilation. Possible DNRCCA (no intubation)    Principle Problem/Diagnosis:  CHF (congestive heart failure), NYHA class II, acute, combined (HCC) [I50.41]    Goals of care evaluation:  The patient goals of care are improve or maintain function/quality of life   Goals of care discussed with:    [x]  Patient independently    []  Patient and Family    []  Family or Healthcare DPOA independently    []  Unable to discuss with patient, family/DPOA not present    Code Status  Full Code    Other recommendations:  Please call with any palliative questions or concerns.  Palliative Care Team is available via perfect serve  or via phone - [phone].     Palliative Care will continue to follow Ms. Anstey's care as needed.      Thank you for allowing Palliative Care to participate in the care of Ms. Madarang .    Electronically signed by   Julious PayerJennifer Hanan Mcwilliams, RN  Palliative Care Team  on 09/21/2018 at 1:43 PM

## 2018-09-21 NOTE — Progress Notes (Signed)
Progress note  [facility]    Adult Hospitalist      Name: Katherine NoDorothy Biondo  MRN: [id number]     Acct: [id number]  Room: 09876543211020/1020-01    Admit Date: 09/17/2018  8:28 AM  PCP: Gwenette Greetalla, Rekha, MD    Primary Problem  Active Problems:    CHF (congestive heart failure), NYHA class II, acute, combined (HCC)  Resolved Problems:    * No resolved hospital problems. *        Assesment:     ?? Acute combined systolic and diastolic CHF  ?? Elevated troponin  ?? Acute COPD exacerbation  ?? Acute respiratory insufficiency  ?? Right lung base pulmonary nodule  ?? Hyperkalemia  ?? Acute renal insufficiency  ?? Hypothyroidism  ?? Cardiomyopathy status post AICD  ?? History of bladder cancer  ?? Anxiety         Plan:     ?? Patient is on step down unit  ?? O2 to maintain oxygen saturation greater than 92%  ?? Serial troponin elevated  ?? Echocardiogram shows EF 25% and diastolic dysfunction  ?? IV Zithromax   ?? holding Aldactone  ?? Decrease Lasix to 40 mg p.o. daily  ?? IV Solu-Medrol  ?? Duo nebs  ?? Continue amiodarone  ?? Continue Synthroid, Remeron, Mirapex  ?? Cardiology consult, input noted  ?? Pulmonology consult, input noted  ?? Nephrology consult, input noted  ?? DVT and GI prophylaxis  ?? Consult SW  ?? Plan SNF stay- pt looking through options         Chief Complaint:     Chief Complaint   Patient presents with   ??? Shortness of Breath     unable to crae for self at home         History of Present Illness:      Patient seen and examined at bedside.  Shortness of breath is improved  potassium improved    No acute events overnight   afebrile   Denies any  headache, dizziness, cough, cold, changes in urination or bowel habits    HPI:    Katherine Cunningham is a 85 y.o.  female who presents with Shortness of Breath (unable to crae for self at home)    85 year old lady with past medical history of CHF, coronary disease status post AICD, COPD, hypothyroidism presented to ER complaining of shortness of breath.  Patient lives at home with her husband who has  dementia.  Patient noticed to have progressive shortness of breath with worsening in the past 24 hours.  Patient denies any fever, chills, cough, nausea, vomiting, change urination, bowel habit.  Patient uses 2 L home O2 at night.  On presentation to ER sheWas tachypneic with respiratory rate in 34 and was requiring 2 to 4 L to maintain her oxygen saturation.  There is a concern by paramedics that patient is probably not able to take care of herself at home.On presentation her BNP was 8642, her troponin was 40, her potassium was 5.5, creatinine was 1.02.  Her LDH was 310, ferritin was 269, d-dimer was 3.12.  Her WBC was 12,000.  Her COVID test was negative.  She has a CTA chest which was negative for PE but shows severe emphysematous changes, pulmonary nodules at the right lung base, bronchiectasis..  Patient admitted with cardiology and pulmonology consult for further management.  I have personally reviewed the past medical history, past surgical history, medications, social history, and family history, and summarized in the note.  Review of Systems:     All 10 point system is reviewed and negative otherwise mentioned in HPI.      Past Medical History:     Past Medical History:   Diagnosis Date   ??? Bladder cancer (HCC)     remission   ??? CHF (congestive heart failure) (HCC)     stage 4   ??? COPD (chronic obstructive pulmonary disease) (HCC)    ??? Neck injury     has injections done for disk issue   ??? Thyroid disease         Past Surgical History:     Past Surgical History:   Procedure Laterality Date   ??? PACEMAKER PLACEMENT      with defibrillator        Medications Prior to Admission:       Prior to Admission medications    Medication Sig Start Date End Date Taking? Authorizing Provider   amiodarone (CORDARONE) 200 MG tablet Take 1 tablet by mouth daily   Yes Historical Provider, MD   ferrous sulfate (IRON 325) 325 (65 Fe) MG tablet Take 1 tablet by mouth daily   Yes Historical Provider, MD   ipratropium-albuterol  (DUONEB) 0.5-2.5 (3) MG/3ML SOLN nebulizer solution Take 1 nebule by mouth every 8 hours as needed   Yes Historical Provider, MD   levothyroxine (SYNTHROID) 25 MCG tablet Take 1 tablet by mouth daily   Yes Historical Provider, MD   lidocaine (LIDODERM) 5 % Place 1 patch onto the skin as needed 11/17/17  Yes Historical Provider, MD   mirtazapine (REMERON) 15 MG tablet Take 15 mg by mouth nightly  08/31/17  Yes Historical Provider, MD   pramipexole (MIRAPEX) 0.25 MG tablet Take 1 tablet by mouth daily as needed  12/30/17  Yes Historical Provider, MD   budesonide-formoterol (SYMBICORT) 160-4.5 MCG/ACT AERO Take 2 puffs by mouth 2 times daily   Yes Historical Provider, MD   albuterol sulfate HFA (PROAIR HFA) 108 (90 Base) MCG/ACT inhaler Take 2 puffs by mouth every 4 hours as needed   Yes Historical Provider, MD   tiotropium (SPIRIVA RESPIMAT) 1.25 MCG/ACT AERS inhaler Take 2 puffs by mouth daily   Yes Historical Provider, MD   spironolactone (ALDACTONE) 25 MG tablet Take 0.5 tablets by mouth daily   Yes Historical Provider, MD   cyclobenzaprine (FLEXERIL) 10 MG tablet Take 1 tablet by mouth 3 times daily    Historical Provider, MD   silver sulfADIAZINE (SILVADENE) 1 % cream Apply 1 inch topically as needed 01/21/18   Historical Provider, MD   acetaminophen-codeine (TYLENOL/CODEINE #3) 300-30 MG per tablet Take 1 tablet by mouth every 8 hours as needed. 11/17/17   Historical Provider, MD        Allergies:       Aspirin and Penicillins    Social History:     Tobacco:    reports that she quit smoking about 15 years ago. She has never used smokeless tobacco.  Alcohol:      reports no history of alcohol use.  Drug Use:  reports no history of drug use.    Family History:     History reviewed. No pertinent family history.      Physical Exam:     Vitals:  BP (!) 107/52    Pulse 76    Temp 97.5 ??F (36.4 ??C) (Oral)    Resp 16    Ht 5\' 2"  (1.575 m)    Wt 84 lb (38.1 kg)  SpO2 95%    BMI 15.36 kg/m??   Temp (24hrs), Avg:97.5 ??F (36.4  ??C), Min:97.3 ??F (36.3 ??C), Max:97.7 ??F (36.5 ??C)          General appearance - alert, frail, and in no moderate distress  Mental status - oriented to person, place, and time with normal affect  Head - normocephalic and atraumatic  Eyes - pupils equal and reactive, extraocular eye movements intact, conjunctiva clear  Ears - hearing appears to be intact  Nose - no drainage noted  Mouth - mucous membranes moist  Neck - supple, no carotid bruits, thyroid not palpable  Chest -basal crackles, wheezing, increased effort  Heart - normal rate, regular rhythm, no murmur  Abdomen - soft, nontender, nondistended, bowel sounds present all four quadrants, no masses, hepatomegaly or splenomegaly  Neurological - normal speech, no focal findings or movement disorder noted, cranial nerves II through XII grossly intact  Extremities -2+ peripheral edema  Skin - no gross lesions, rashes, or induration noted        Data:     Labs:    Hematology:  Recent Labs     09/19/18  0426 09/20/18  0520 09/21/18  0458   WBC 18.6* 17.7* 15.9*   RBC 3.52* 3.70* 3.83*   HGB 11.3* 11.8* 12.2   HCT 35.4* 37.0 38.8   MCV 100.6 100.0 101.3   MCH 32.1 31.9 31.9   MCHC 31.9 31.9 31.4   RDW 15.8* 15.9* 15.9*   PLT 215 206 220   MPV 9.3 9.1 9.8     Chemistry:  Recent Labs     09/19/18  0426 09/19/18  1112 09/20/18  0520 09/21/18  0458   NA 137  --  139 140   K 3.9  --  4.2 4.0   CL 97*  --  97* 96*   CO2 24  --  27 30   GLUCOSE 201*  --  168* 92   BUN 37*  --  46* 42*   CREATININE 1.33*  --  1.13* 0.96*   MG 2.1  --  2.2 2.3   ANIONGAP 16  --  15 14   LABGLOM 38*  --  46* 56*   GFRAA 46*  --  56* >60   CALCIUM 8.9  --  9.4 9.6   PHOS 3.6  --  3.3 2.9   PROBNP 12,117*  --  10,678* 15,894*   TROPHS  --  33*  --   --    CKTOTAL  --  145  --   --    MYOGLOBIN  --  140*  --   --      Recent Labs     09/19/18  2022 09/20/18  0741 09/20/18  1141 09/20/18  1650 09/20/18  1952 09/21/18  0717   POCGLU 132* 142* 146* 87 146* 102       No results found for: INR,  PROTIME    Lab Results   Component Value Date/Time    SPECIAL RAC 09/17/2018 09:26 AM     Lab Results   Component Value Date/Time    CULTURE NO GROWTH 3 DAYS 09/17/2018 09:26 AM       Lab Results   Component Value Date    POCPH 7.39 09/17/2018    POCPCO2 43 09/17/2018    POCPO2 81 09/17/2018    POCHCO3 25.9 09/17/2018    NBEA NOT REPORTED 09/17/2018    PBEA 1 09/17/2018    TCO2ART  27 09/17/2018    POCO2SAT 96 09/17/2018    FIO2 30.0 09/17/2018       Radiology:    Xr Chest Portable    Result Date: 09/17/2018  Borderline cardiac size.  Findings of generalized COPD with appearance of biapical scarring.  Opacity at the right base laterally which may represent focal airspace disease.     Ct Chest Pulmonary Embolism W Contrast    Result Date: 09/17/2018  1.  No evidence of pulmonary embolus. 2.  Severe emphysematous changes in the lungs. 3.  Pulmonary nodules at the right lung base. 4.  Bronchiectasis.  No focal consolidation or effusion. 5.  Atherosclerotic disease. RECOMMENDATIONS: Fleischner Society guidelines for follow-up and management of incidentally detected pulmonary nodules: Multiple Solid Nodules: Nodule size greater than 8 mm In a low-risk patient, CT at 3-6 months, then consider CT at 18-24 months. In a high-risk patient, CT at 3-6 months, then CT at 18-24 months. - Low risk patients include individuals with minimal or absent history of smoking and other known risk factors. - High risk patients include individuals with a history or smoking or known risk factors. Radiology 2017 http://pubs.https://www.stafford-myers.[email or web address]         All radiological studies reviewed                Code Status:  Full Code    Electronically signed by Joanne Chars, MD on 09/21/2018 at 7:35 AM     Copy sent to Dr. Gwenette Greet, MD    This note was created with the assistance of a speech-recognition program.  Although the intention is to generate a document that actually reflects the content of the visit, no guarantees  can be provided that every mistake has been identified and corrected by editing.     Note was updated later by me after  physical examination and  completion of the assessment.

## 2018-09-21 NOTE — Progress Notes (Signed)
Reason for Consult:  Acute kidney injury.    Interval Hx:  Cr better at 0.96 today  Afebrile  BP OK  Dyspnea stable on 2L by NC      HISTORY OF PRESENT ILLNESS:    The patient is a 85 y.o. female who presents with shortness of breath. She had iv contrast with CTA chest today. No previous labs available in our system for comparison.. On admission she was noted to have elevated creatinine of 1.02 with a potassium level of 5.5. Presently on BiPAP. Denies any problems with nausea, vomiting, appetite, diarrhea or difficulty with urination. Denies any recent use of NSAIDs.    Review Of Systems:   Constitutional: No fever, chills, lethargy, weakness or wt loss.  Cardiac:  No chest pain, dyspnea, orthopnea or PND.  Chest:              No cough, phlegm or wheezing.  Abdomen:  No abdominal pain, nausea, vomiting or diarrhea.  GU:   No hematuria, pyuria, dysuria or flank pain.  Extremities:  No swelling or joint pains.  Hematology:    No bleeding disorders, bruising or anemia.  All other ROS is negative.      Past Medical History:   Diagnosis Date   ??? Bladder cancer (HCC)     remission   ??? CHF (congestive heart failure) (HCC)     stage 4   ??? COPD (chronic obstructive pulmonary disease) (HCC)    ??? Neck injury     has injections done for disk issue   ??? Thyroid disease        Past Surgical History:   Procedure Laterality Date   ??? PACEMAKER PLACEMENT      with defibrillator       Prior to Admission medications    Medication Sig Start Date End Date Taking? Authorizing Provider   amiodarone (CORDARONE) 200 MG tablet Take 1 tablet by mouth daily   Yes Historical Provider, MD   ferrous sulfate (IRON 325) 325 (65 Fe) MG tablet Take 1 tablet by mouth daily   Yes Historical Provider, MD   ipratropium-albuterol (DUONEB) 0.5-2.5 (3) MG/3ML SOLN nebulizer solution Take 1 nebule by mouth every 8 hours as needed   Yes Historical Provider, MD   levothyroxine (SYNTHROID) 25 MCG tablet Take 1 tablet by mouth daily   Yes Historical Provider, MD    lidocaine (LIDODERM) 5 % Place 1 patch onto the skin as needed 11/17/17  Yes Historical Provider, MD   mirtazapine (REMERON) 15 MG tablet Take 15 mg by mouth nightly  08/31/17  Yes Historical Provider, MD   pramipexole (MIRAPEX) 0.25 MG tablet Take 1 tablet by mouth daily as needed  12/30/17  Yes Historical Provider, MD   budesonide-formoterol (SYMBICORT) 160-4.5 MCG/ACT AERO Take 2 puffs by mouth 2 times daily   Yes Historical Provider, MD   albuterol sulfate HFA (PROAIR HFA) 108 (90 Base) MCG/ACT inhaler Take 2 puffs by mouth every 4 hours as needed   Yes Historical Provider, MD   tiotropium (SPIRIVA RESPIMAT) 1.25 MCG/ACT AERS inhaler Take 2 puffs by mouth daily   Yes Historical Provider, MD   spironolactone (ALDACTONE) 25 MG tablet Take 0.5 tablets by mouth daily   Yes Historical Provider, MD   cyclobenzaprine (FLEXERIL) 10 MG tablet Take 1 tablet by mouth 3 times daily    Historical Provider, MD   silver sulfADIAZINE (SILVADENE) 1 % cream Apply 1 inch topically as needed 01/21/18   Historical Provider, MD  acetaminophen-codeine (TYLENOL/CODEINE #3) 300-30 MG per tablet Take 1 tablet by mouth every 8 hours as needed. 11/17/17   Historical Provider, MD       Scheduled Meds:  ??? methylPREDNISolone  40 mg Intravenous Q8H   ??? ipratropium-albuterol  1 ampule Inhalation Q4H WA   ??? furosemide  40 mg Oral Daily   ??? sodium chloride flush  10 mL Intravenous BID   ??? sodium chloride flush  10 mL Intravenous 2 times per day   ??? amiodarone  200 mg Oral Daily   ??? cyclobenzaprine  10 mg Oral TID   ??? ferrous sulfate  325 mg Oral Daily   ??? levothyroxine  25 mcg Oral Daily   ??? mirtazapine  15 mg Oral Nightly   ??? budesonide-formoterol  2 puff Inhalation BID   ??? famotidine  20 mg Oral Daily   ??? heparin (porcine)  5,000 Units Subcutaneous BID   ??? azithromycin  500 mg Intravenous Q24H     Continuous Infusions:  ??? dextrose       PRN Meds:calcium carbonate, nitroGLYCERIN, zolpidem, sodium chloride flush, ipratropium-albuterol, pramipexole,  HYDROcodone 5 mg - acetaminophen, acetaminophen **OR** acetaminophen, senna, promethazine **OR** ondansetron, glucose, dextrose, glucagon (rDNA), dextrose    Allergies   Allergen Reactions   ??? Aspirin Other (See Comments)   ??? Penicillins Rash       Social History     Socioeconomic History   ??? Marital status: Married     Spouse name: Not on file   ??? Number of children: Not on file   ??? Years of education: Not on file   ??? Highest education level: Not on file   Occupational History   ??? Not on file   Social Needs   ??? Financial resource strain: Not on file   ??? Food insecurity     Worry: Not on file     Inability: Not on file   ??? Transportation needs     Medical: Not on file     Non-medical: Not on file   Tobacco Use   ??? Smoking status: Former Smoker     Last attempt to quit: 2005     Years since quitting: 15.4   ??? Smokeless tobacco: Never Used   Substance and Sexual Activity   ??? Alcohol use: Never     Frequency: Never   ??? Drug use: Never   ??? Sexual activity: Not on file   Lifestyle   ??? Physical activity     Days per week: Not on file     Minutes per session: Not on file   ??? Stress: Not on file   Relationships   ??? Social Wellsite geologist on phone: Not on file     Gets together: Not on file     Attends religious service: Not on file     Active member of club or organization: Not on file     Attends meetings of clubs or organizations: Not on file     Relationship status: Not on file   ??? Intimate partner violence     Fear of current or ex partner: Not on file     Emotionally abused: Not on file     Physically abused: Not on file     Forced sexual activity: Not on file   Other Topics Concern   ??? Not on file   Social History Narrative   ??? Not on file       History  reviewed. No pertinent family history.      Physical Exam:  Vitals:    09/21/18 0654 09/21/18 0808 09/21/18 0820 09/21/18 1142   BP:  (!) 146/65  (!) 115/46   Pulse:  86  87   Resp:  16  16   Temp:  97.3 ??F (36.3 ??C)  97.3 ??F (36.3 ??C)   TempSrc:  Oral  Oral    SpO2:  90% 92% 92%   Weight: 84 lb (38.1 kg)      Height:         I/O last 3 completed shifts:  In: 900 [P.O.:900]  Out: -     General:  Awake, alert, not in distress. Appears to be stated age.  HEENT: Atraumatic, normocephalic. Anicteric sclera. Pink and moist oral mucosa. No carotid bruit. No JVD.  Chest: Bilateral air entry, clear to auscultation, no wheezing, rhonchi or rales.  Cardiovascular: RRR, S1S2, no murmur, rub or gallop. No lower extremity edema.    Abdomen: Soft, non tender to palpation. Active bowel sounds x 4 quadrants.  Musculoskeletal: Active ROM x 4 extremities. No cyanosis or clubbing.  Integumentary: Pink, warm and dry. Free from rash or lesions. Skin turgor normal.  CNS: Speech clear. Face symmetrical. No tremor.     Data:  CBC:   Lab Results   Component Value Date    WBC 15.9 (H) 09/21/2018    HGB 12.2 09/21/2018    HCT 38.8 09/21/2018    MCV 101.3 09/21/2018    PLT 220 09/21/2018     BMP:    Lab Results   Component Value Date    NA 140 09/21/2018    NA 139 09/20/2018    NA 137 09/19/2018    K 4.0 09/21/2018    K 4.2 09/20/2018    K 3.9 09/19/2018    CL 96 (L) 09/21/2018    CL 97 (L) 09/20/2018    CL 97 (L) 09/19/2018    CO2 30 09/21/2018    CO2 27 09/20/2018    CO2 24 09/19/2018    BUN 42 (H) 09/21/2018    BUN 46 (H) 09/20/2018    BUN 37 (H) 09/19/2018    CREATININE 0.96 (H) 09/21/2018    CREATININE 1.13 (H) 09/20/2018    CREATININE 1.33 (H) 09/19/2018    GLUCOSE 92 09/21/2018    GLUCOSE 168 (H) 09/20/2018    GLUCOSE 201 (H) 09/19/2018     CMP:   Lab Results   Component Value Date    NA 140 09/21/2018    K 4.0 09/21/2018    CL 96 09/21/2018    CO2 30 09/21/2018    BUN 42 09/21/2018    CREATININE 0.96 09/21/2018    GLUCOSE 92 09/21/2018    CALCIUM 9.6 09/21/2018    PROT 6.9 09/18/2018    LABALBU 4.1 09/17/2018    BILITOT 0.14 09/17/2018    ALKPHOS 103 09/17/2018    AST 36 09/17/2018    ALT 22 09/17/2018      Hepatic:   Lab Results   Component Value Date    AST 36 (H) 09/17/2018    ALT 22  09/17/2018    BILITOT 0.14 (L) 09/17/2018    ALKPHOS 103 09/17/2018     BNP: No results found for: BNP  Lipids: No results found for: CHOL, HDL  INR: No results found for: INR  PTH: No results found for: PTH  Phosphorus:    Lab Results   Component Value Date  PHOS 2.9 09/21/2018     Ionized Calcium: No results found for: IONCA  Magnesium:   Lab Results   Component Value Date    MG 2.3 09/21/2018     Albumin:   Lab Results   Component Value Date    LABALBU 4.1 09/17/2018     Last 3 CK, CKMB, Troponin: @LABRCNT (CKTOTAL:3,CKMB:3,TROPONINI:3)       URINE:)No results found for: Ofilia NeasNAUR, PROTUR     Radiology:   Reviewed.    Assessment:  1. Acute kidney injury, appears to be hemodynamically related.  2. CKD stage 3.   3. Hyperkalemia, secondary to AKI, improved.  4. Elevated calcium, with elevated vitamin D level, improved.  5. Exacerbation of COPD.    Plan:  1. Holding aldactone for now. Avoid vitamin D.  2. Off IVF, lasix dose was decreased to 40 po daily due to rising Cr.  3. Mild unilateral hydro on US, consider GU eval if Cr continues to rise  4. Renal diet with oral fluid restriction of 1000 ml/24 hours.   5. Avoid hypotension, nephrotoxic drugs, Lovenox, Fleets enema and IV contrast exposure.  5. Follow up chemistries ordered for AM.    Thank you for the consultation. Please do not hesitate to contact us for any further questions/concerns. We will continue to follow along with you.       Electronically signed by Margret ChanceZafar M Slater Mcmanaman, MD  on 09/21/2018 at 12:13 PM   [facility].  Ph: [phone]

## 2018-09-21 NOTE — Progress Notes (Signed)
Occupational Therapy  Facility/Department: [facility]  Daily Treatment Note  Katherine Cunningham  DOB: 08-27-1932  MRN: [id number]    Date of Service: 09/21/2018    Discharge Recommendations:  Subacute/Skilled Nursing Facility       Assessment   Performance deficits / Impairments: Decreased functional mobility ;Decreased ADL status;Decreased strength;Decreased safe awareness;Decreased balance;Decreased cognition;Decreased endurance;Decreased posture  Prognosis: Good;Fair  REQUIRES OT FOLLOW UP: Yes  Activity Tolerance  Activity Tolerance: Patient limited by fatigue;Treatment limited secondary to medical complications (CHF)  Activity Tolerance: Poor  Safety Devices  Safety Devices in place: Yes  Type of devices: Patient at risk for falls;Bed alarm in place;Left in bed;Call light within reach;Nurse notified;Gait belt         Patient Diagnosis(es): The primary encounter diagnosis was COPD exacerbation (HCC). Diagnoses of Chronic systolic congestive heart failure (HCC), Shortness of breath, and Hypoxia were also pertinent to this visit.      has a past medical history of Bladder cancer (HCC), CHF (congestive heart failure) (HCC), COPD (chronic obstructive pulmonary disease) (HCC), Neck injury, and Thyroid disease.   has a past surgical history that includes pacemaker placement.    Restrictions  Restrictions/Precautions  Restrictions/Precautions: Up as Tolerated, General Precautions, Fall Risk  Required Braces or Orthoses?: No  Position Activity Restriction  Other position/activity restrictions: Telemetry, up with assist using RW, O2 NC  Subjective   General  Chart Reviewed: Yes  Patient assessed for rehabilitation services?: Yes  Response to previous treatment: Patient with no complaints from previous session  Family / Caregiver Present: No      Orientation  Orientation  Overall Orientation Status: Within Functional Limits  Objective    ADL  Toileting: Maximum assistance(for brief mgmt. pt was able to complete  hygiene after voiding without assist)  Additional Comments: Pt is VERY limited due to fatigue/weakness and SOB. POOR activity tolerance noted.        Balance  Sitting Balance: Supervision  Standing Balance: Contact guard assistance  Toilet Transfers  Toilet - Technique: Stand step  Equipment Used: Standard bedside commode  Toilet Transfer: Contact guard assistance  Toilet Transfers Comments: Verbal cues for safety/hand placement.  Bed mobility  Sit to Supine: Minimal assistance  Scooting: Stand by assistance  Transfers  Stand Step Transfers: Contact guard assistance  Sit to stand: Contact guard assistance  Stand to sit: Contact guard assistance  Transfer Comments: Verbal cues for safety/hand placement.                       Cognition  Overall Cognitive Status: Exceptions  Arousal/Alertness: Appropriate responses to stimuli  Following Commands: Follows one step commands with repetition  Attention Span: Appears intact  Memory: Decreased recall of recent events  Safety Judgement: Decreased awareness of need for safety;Decreased awareness of need for assistance  Problem Solving: Assistance required to generate solutions;Assistance required to implement solutions;Decreased awareness of errors;Assistance required to correct errors made  Insights: Decreased awareness of deficits  Initiation: Requires cues for some  Sequencing: Requires cues for some     Perception  Overall Per[facility]                                   Plan   Plan  Times per week: 4-5x/week, 1-2x/day  Current Treatment Recommendations: Strengthening, Location managerBalance Training, Building services engineerunctional Mobility Training, Teaching laboratory technicianndurance Training, Psychologist, educationalCognitive Reorientation, Mining engineerquipment Evaluation, Education, Sports administrator& procurement, Equities traderatient/Caregiver Education & Training,  Self-Care / ADL, Cognitive/Perceptual Training, Safety Education & Training, Positioning                                   AM-PAC Score        AM-PAC Inpatient Daily Activity Raw Score: 17 (09/21/18 1120)  AM-PAC Inpatient  ADL T-Scale Score : 37.26 (09/21/18 1120)  ADL Inpatient CMS 0-100% Score: 50.11 (09/21/18 1120)  ADL Inpatient CMS G-Code Modifier : CK (09/21/18 1120)    Goals  Short term goals  Time Frame for Short term goals: by discharge, pt will  Short term goal 1: demo SBA with ADL transfers with good safety, AD/DME as needed  Short term goal 2: demo SBA with functional mob in room with good safety for ADL completion with good safety/pacing and approp aD  Short term goal 3: demo SBA with UB ADLs and min A LB ADLs with good safety/pacing, DME as needed  Short term goal 4: demo CGA with toileting routine with good safety  Short term goal 5: demo and verb good understanding of fall prevention techs, EC/WS techs, B UE HEP, and possible equip needs   Patient Goals   Patient goals : to go  home when able       Therapy Time   Individual Concurrent Group Co-treatment   Time In 1101         Time Out 1109         Minutes 8                 Jackquline Denmark, OTA

## 2018-09-21 NOTE — Care Coordination-Inpatient (Signed)
Discharge planning    Patient seen by therapy and per palliative care.     Candise Bowens RN from palliative care I discussed patient's wishes with her re: intubation/defibrillation/ventilation/CPR. Pt states, "I don't think I want any of that". I explained the 3 different levels of code classifications to her. She is agreeable to [facility] (no intubation). Will have Primary service talk with patient again about her wishes and change code status if that is what she decides.   ??  Therapy is recommending snf and did speak with her about this. She is agreeable. [facility]SM list give.     She is agreeable to snf and following places.   1. [facility]  2. [facility]  3. [facility]     The Plan for Transition of Care is related to the following treatment goals: skilled RN and therapy     The Patient and/or patient representative   was provided with a choice of provider and agrees   with the discharge plan. [x]  Yes []  No    Freedom of choice list was provided with basic dialogue that supports the patient's individualized plan of care/goals, treatment preferences and shares the quality data associated with the providers. [x]  Yes []  No

## 2018-09-22 ENCOUNTER — Inpatient Hospital Stay: Admit: 2018-09-22 | Payer: MEDICARE | Primary: Family Medicine

## 2018-09-22 LAB — BASIC METABOLIC PANEL
Anion Gap: 13 mmol/L (ref 9–17)
BUN: 46 mg/dL — ABNORMAL HIGH (ref 8–23)
Bun/Cre Ratio: 43 — ABNORMAL HIGH (ref 9–20)
CO2: 31 mmol/L (ref 20–31)
Calcium: 8.9 mg/dL (ref 8.6–10.4)
Chloride: 95 mmol/L — ABNORMAL LOW (ref 98–107)
Creatinine: 1.08 mg/dL — ABNORMAL HIGH (ref 0.50–0.90)
GFR African American: 59 mL/min — ABNORMAL LOW (ref >60)
GFR Non-African American: 48 mL/min — ABNORMAL LOW (ref >60)
Glucose: 132 mg/dL — ABNORMAL HIGH (ref 70–99)
Potassium: 4.2 mmol/L (ref 3.7–5.3)
Sodium: 139 mmol/L (ref 135–144)

## 2018-09-22 LAB — CBC WITH AUTO DIFFERENTIAL
Absolute Eos #: 0 10*3/uL (ref 0.00–0.44)
Absolute Immature Granulocyte: 0.12 10*3/uL (ref 0.00–0.30)
Absolute Lymph #: 0.23 10*3/uL — ABNORMAL LOW (ref 1.10–3.70)
Absolute Mono #: 0.35 10*3/uL (ref 0.10–1.20)
Basophils Absolute: 0 10*3/uL (ref 0.00–0.20)
Basophils: 0 % (ref 0–2)
Eosinophils %: 0 % — ABNORMAL LOW (ref 1–4)
Hematocrit: 35.2 % — ABNORMAL LOW (ref 36.3–47.1)
Hemoglobin: 11.2 g/dL — ABNORMAL LOW (ref 11.9–15.1)
Immature Granulocytes: 1 % — ABNORMAL HIGH
Lymphocytes: 2 % — ABNORMAL LOW (ref 24–43)
MCH: 31.7 pg (ref 25.2–33.5)
MCHC: 31.8 g/dL (ref 28.4–34.8)
MCV: 99.7 fL (ref 82.6–102.9)
MPV: 9.5 fL (ref 8.1–13.5)
Monocytes: 3 % (ref 3–12)
NRBC Automated: 0 per 100 WBC
Platelets: 181 10*3/uL (ref 138–453)
RBC: 3.53 m/uL — ABNORMAL LOW (ref 3.95–5.11)
RDW: 15.7 % — ABNORMAL HIGH (ref 11.8–14.4)
Seg Neutrophils: 94 % — ABNORMAL HIGH (ref 36–65)
Segs Absolute: 11 10*3/uL — ABNORMAL HIGH (ref 1.50–8.10)
WBC: 11.7 10*3/uL — ABNORMAL HIGH (ref 3.5–11.3)

## 2018-09-22 LAB — BRAIN NATRIURETIC PEPTIDE: Pro-BNP: 16633 pg/mL — ABNORMAL HIGH (ref <300)

## 2018-09-22 LAB — PHOSPHORUS: Phosphorus: 3.2 mg/dL (ref 2.6–4.5)

## 2018-09-22 LAB — POC GLUCOSE FINGERSTICK
POC Glucose: 148 mg/dL — ABNORMAL HIGH (ref 65–105)
POC Glucose: 189 mg/dL — ABNORMAL HIGH (ref 65–105)
POC Glucose: 124 mg/dL — ABNORMAL HIGH (ref 65–105)
POC Glucose: 148 mg/dL — ABNORMAL HIGH (ref 65–105)

## 2018-09-22 LAB — MAGNESIUM: Magnesium: 2.2 mg/dL (ref 1.6–2.6)

## 2018-09-22 MED ORDER — OXYCODONE-ACETAMINOPHEN 5-325 MG PO TABS
5-325 MG | ORAL | Status: DC | PRN
Start: 2018-09-22 — End: 2018-09-23
  Administered 2018-09-22 – 2018-09-23 (×4): 1 via ORAL

## 2018-09-22 MED ORDER — METHYLPREDNISOLONE SODIUM SUCC 40 MG IJ SOLR
40 MG | Freq: Two times a day (BID) | INTRAMUSCULAR | Status: DC
Start: 2018-09-22 — End: 2018-09-23
  Administered 2018-09-23: 04:00:00 30 mg via INTRAVENOUS

## 2018-09-22 MED FILL — SOLU-MEDROL 40 MG IJ SOLR: 40 MG | INTRAMUSCULAR | Qty: 40

## 2018-09-22 MED FILL — FUROSEMIDE 40 MG PO TABS: 40 MG | ORAL | Qty: 1

## 2018-09-22 MED FILL — OXYCODONE-ACETAMINOPHEN 5-325 MG PO TABS: 5-325 MG | ORAL | Qty: 1

## 2018-09-22 MED FILL — FERROUS SULFATE 325 (65 FE) MG PO TBEC: 325 (65 Fe) MG | ORAL | Qty: 1

## 2018-09-22 MED FILL — IPRATROPIUM-ALBUTEROL 0.5-2.5 (3) MG/3ML IN SOLN: RESPIRATORY_TRACT | Qty: 3

## 2018-09-22 MED FILL — AMIODARONE HCL 200 MG PO TABS: 200 MG | ORAL | Qty: 1

## 2018-09-22 MED FILL — HEPARIN SODIUM (PORCINE) 5000 UNIT/ML IJ SOLN: 5000 UNIT/ML | INTRAMUSCULAR | Qty: 1

## 2018-09-22 MED FILL — CYCLOBENZAPRINE HCL 10 MG PO TABS: 10 MG | ORAL | Qty: 1

## 2018-09-22 MED FILL — LEVOTHYROXINE SODIUM 25 MCG PO TABS: 25 MCG | ORAL | Qty: 1

## 2018-09-22 MED FILL — FAMOTIDINE 20 MG PO TABS: 20 MG | ORAL | Qty: 1

## 2018-09-22 MED FILL — ASPERCREME LIDOCAINE 4 % EX PTCH: 4 % | CUTANEOUS | Qty: 1

## 2018-09-22 MED FILL — HYDROCODONE-ACETAMINOPHEN 5-325 MG PO TABS: 5-325 MG | ORAL | Qty: 1

## 2018-09-22 MED FILL — MIRTAZAPINE 15 MG PO TABS: 15 MG | ORAL | Qty: 1

## 2018-09-22 NOTE — Care Coordination-Inpatient (Signed)
SW met with pt to discuss discharge planning, SW explained the outcome of referrals, [facility] are not taking pts this week and offered the [facility] location.  The pt was on the phone with her daughter in law Lind.  Pt and Jenny Reichmann are thinking about going home at discharge, Jenny Reichmann requested for the bedside nurse to give her a call regarding how pt is doing.    SW updated the team during quality flow rounds, SW following

## 2018-09-22 NOTE — Progress Notes (Signed)
Occupational Therapy    DATE: 09/22/2018    NAME: Katherine Cunningham  MRN: [id number]   DOB: [birth year]    [facility]  Occupational Therapy Not Seen Note    Patient not available for Occupational Therapy due to:    []  Testing:    []  Hemodialysis    []  Cancelled by RN:    [x] Refusal by Patient: Attempted to see pt several times this date. First attempt at 9:05, pt eating breakfast, second attempt pt talking on phone, third attempt at 10:52, pt in bed and refused therapy at this time due to just being up to the [facility] and getting bathed. Pt too fatigued.    []  Surgery:     []  Intubation:     []  Pain Medication:    []  Sedation:     []  Spine Precautions :    []  Medical Instability:    []  Other:    Jackquline Denmark, COTA/L

## 2018-09-22 NOTE — Progress Notes (Signed)
Reason for Consult:  Acute kidney injury.    Interval Hx:  Cr slightly higher at 1.08 today  Afebrile  BP OK  Dyspnea stable on 2L by NC      HISTORY OF PRESENT ILLNESS:    The patient is a 85 y.o. female who presents with shortness of breath. She had iv contrast with CTA chest today. No previous labs available in our system for comparison.. On admission she was noted to have elevated creatinine of 1.02 with a potassium level of 5.5. Presently on BiPAP. Denies any problems with nausea, vomiting, appetite, diarrhea or difficulty with urination. Denies any recent use of NSAIDs.    Review Of Systems:   Constitutional: No fever, chills, lethargy, weakness or wt loss.  Cardiac:  No chest pain, dyspnea, orthopnea or PND.  Chest:              No cough, phlegm or wheezing.  Abdomen:  No abdominal pain, nausea, vomiting or diarrhea.  GU:   No hematuria, pyuria, dysuria or flank pain.  Extremities:  No swelling or joint pains.  Hematology:    No bleeding disorders, bruising or anemia.  All other ROS is negative.      Past Medical History:   Diagnosis Date   ??? Bladder cancer (HCC)     remission   ??? CHF (congestive heart failure) (HCC)     stage 4   ??? COPD (chronic obstructive pulmonary disease) (HCC)    ??? Neck injury     has injections done for disk issue   ??? Thyroid disease        Past Surgical History:   Procedure Laterality Date   ??? PACEMAKER PLACEMENT      with defibrillator       Prior to Admission medications    Medication Sig Start Date End Date Taking? Authorizing Provider   amiodarone (CORDARONE) 200 MG tablet Take 1 tablet by mouth daily   Yes Historical Provider, MD   ferrous sulfate (IRON 325) 325 (65 Fe) MG tablet Take 1 tablet by mouth daily   Yes Historical Provider, MD   ipratropium-albuterol (DUONEB) 0.5-2.5 (3) MG/3ML SOLN nebulizer solution Take 1 nebule by mouth every 8 hours as needed   Yes Historical Provider, MD   levothyroxine (SYNTHROID) 25 MCG tablet Take 1 tablet by mouth daily   Yes Historical  Provider, MD   lidocaine (LIDODERM) 5 % Place 1 patch onto the skin as needed 11/17/17  Yes Historical Provider, MD   mirtazapine (REMERON) 15 MG tablet Take 15 mg by mouth nightly  08/31/17  Yes Historical Provider, MD   pramipexole (MIRAPEX) 0.25 MG tablet Take 1 tablet by mouth daily as needed  12/30/17  Yes Historical Provider, MD   budesonide-formoterol (SYMBICORT) 160-4.5 MCG/ACT AERO Take 2 puffs by mouth 2 times daily   Yes Historical Provider, MD   albuterol sulfate HFA (PROAIR HFA) 108 (90 Base) MCG/ACT inhaler Take 2 puffs by mouth every 4 hours as needed   Yes Historical Provider, MD   tiotropium (SPIRIVA RESPIMAT) 1.25 MCG/ACT AERS inhaler Take 2 puffs by mouth daily   Yes Historical Provider, MD   spironolactone (ALDACTONE) 25 MG tablet Take 0.5 tablets by mouth daily   Yes Historical Provider, MD   cyclobenzaprine (FLEXERIL) 10 MG tablet Take 1 tablet by mouth 3 times daily    Historical Provider, MD   silver sulfADIAZINE (SILVADENE) 1 % cream Apply 1 inch topically as needed 01/21/18   Historical Provider, MD  acetaminophen-codeine (TYLENOL/CODEINE #3) 300-30 MG per tablet Take 1 tablet by mouth every 8 hours as needed. 11/17/17   Historical Provider, MD       Scheduled Meds:  ??? lidocaine  1 patch Transdermal Daily   ??? methylPREDNISolone  40 mg Intravenous Q8H   ??? ipratropium-albuterol  1 ampule Inhalation Q4H WA   ??? furosemide  40 mg Oral Daily   ??? sodium chloride flush  10 mL Intravenous BID   ??? sodium chloride flush  10 mL Intravenous 2 times per day   ??? amiodarone  200 mg Oral Daily   ??? cyclobenzaprine  10 mg Oral TID   ??? ferrous sulfate  325 mg Oral Daily   ??? levothyroxine  25 mcg Oral Daily   ??? mirtazapine  15 mg Oral Nightly   ??? budesonide-formoterol  2 puff Inhalation BID   ??? famotidine  20 mg Oral Daily   ??? heparin (porcine)  5,000 Units Subcutaneous BID     Continuous Infusions:  ??? dextrose       PRN Meds:oxyCODONE-acetaminophen, calcium carbonate, nitroGLYCERIN, zolpidem, sodium chloride flush,  ipratropium-albuterol, pramipexole, acetaminophen **OR** acetaminophen, senna, promethazine **OR** ondansetron, glucose, dextrose, glucagon (rDNA), dextrose    Allergies   Allergen Reactions   ??? Aspirin Other (See Comments)   ??? Penicillins Rash       Social History     Socioeconomic History   ??? Marital status: Married     Spouse name: Not on file   ??? Number of children: Not on file   ??? Years of education: Not on file   ??? Highest education level: Not on file   Occupational History   ??? Not on file   Social Needs   ??? Financial resource strain: Not on file   ??? Food insecurity     Worry: Not on file     Inability: Not on file   ??? Transportation needs     Medical: Not on file     Non-medical: Not on file   Tobacco Use   ??? Smoking status: Former Smoker     Last attempt to quit: 2005     Years since quitting: 15.4   ??? Smokeless tobacco: Never Used   Substance and Sexual Activity   ??? Alcohol use: Never     Frequency: Never   ??? Drug use: Never   ??? Sexual activity: Not on file   Lifestyle   ??? Physical activity     Days per week: Not on file     Minutes per session: Not on file   ??? Stress: Not on file   Relationships   ??? Social Wellsite geologist on phone: Not on file     Gets together: Not on file     Attends religious service: Not on file     Active member of club or organization: Not on file     Attends meetings of clubs or organizations: Not on file     Relationship status: Not on file   ??? Intimate partner violence     Fear of current or ex partner: Not on file     Emotionally abused: Not on file     Physically abused: Not on file     Forced sexual activity: Not on file   Other Topics Concern   ??? Not on file   Social History Narrative   ??? Not on file       History reviewed. No pertinent family  history.      Physical Exam:  Vitals:    09/22/18 0450 09/22/18 0647 09/22/18 0743 09/22/18 0753   BP: (!) 105/56  123/61    Pulse: 81  76    Resp: 16  20    Temp: 97.7 ??F (36.5 ??C)  97.7 ??F (36.5 ??C)    TempSrc: Oral  Oral     SpO2: 96%  94% 96%   Weight:  85 lb 8 oz (38.8 kg)     Height:         I/O last 3 completed shifts:  In: 840 [P.O.:840]  Out: 900 [Urine:900]    General:  Awake, alert, not in distress. Appears to be stated age.  HEENT: Atraumatic, normocephalic. Anicteric sclera. Pink and moist oral mucosa. No carotid bruit. No JVD.  Chest: Bilateral air entry, clear to auscultation, no wheezing, rhonchi or rales.  Cardiovascular: RRR, S1S2, no murmur, rub or gallop. No lower extremity edema.    Abdomen: Soft, non tender to palpation. Active bowel sounds x 4 quadrants.  Musculoskeletal: Active ROM x 4 extremities. No cyanosis or clubbing.  Integumentary: Pink, warm and dry. Free from rash or lesions. Skin turgor normal.  CNS: Speech clear. Face symmetrical. No tremor.     Data:  CBC:   Lab Results   Component Value Date    WBC 11.7 (H) 09/22/2018    HGB 11.2 (L) 09/22/2018    HCT 35.2 (L) 09/22/2018    MCV 99.7 09/22/2018    PLT 181 09/22/2018     BMP:    Lab Results   Component Value Date    NA 139 09/22/2018    NA 140 09/21/2018    NA 139 09/20/2018    K 4.2 09/22/2018    K 4.0 09/21/2018    K 4.2 09/20/2018    CL 95 (L) 09/22/2018    CL 96 (L) 09/21/2018    CL 97 (L) 09/20/2018    CO2 31 09/22/2018    CO2 30 09/21/2018    CO2 27 09/20/2018    BUN 46 (H) 09/22/2018    BUN 42 (H) 09/21/2018    BUN 46 (H) 09/20/2018    CREATININE 1.08 (H) 09/22/2018    CREATININE 0.96 (H) 09/21/2018    CREATININE 1.13 (H) 09/20/2018    GLUCOSE 132 (H) 09/22/2018    GLUCOSE 92 09/21/2018    GLUCOSE 168 (H) 09/20/2018     CMP:   Lab Results   Component Value Date    NA 139 09/22/2018    K 4.2 09/22/2018    CL 95 09/22/2018    CO2 31 09/22/2018    BUN 46 09/22/2018    CREATININE 1.08 09/22/2018    GLUCOSE 132 09/22/2018    CALCIUM 8.9 09/22/2018    PROT 6.9 09/18/2018    LABALBU 4.1 09/17/2018    BILITOT 0.14 09/17/2018    ALKPHOS 103 09/17/2018    AST 36 09/17/2018    ALT 22 09/17/2018      Hepatic:   Lab Results   Component Value Date    AST 36  (H) 09/17/2018    ALT 22 09/17/2018    BILITOT 0.14 (L) 09/17/2018    ALKPHOS 103 09/17/2018     BNP: No results found for: BNP  Lipids: No results found for: CHOL, HDL  INR: No results found for: INR  PTH: No results found for: PTH  Phosphorus:    Lab Results   Component Value Date  PHOS 3.2 09/22/2018     Ionized Calcium: No results found for: IONCA  Magnesium:   Lab Results   Component Value Date    MG 2.2 09/22/2018     Albumin:   Lab Results   Component Value Date    LABALBU 4.1 09/17/2018     Last 3 CK, CKMB, Troponin: (CKTOTAL:3,CKMB:3,TROPONINI:3)       URINE:)No results found for: Ofilia Neas     Radiology:   Reviewed.    Assessment:  1. Acute kidney injury, appears to be hemodynamically related.  2. CKD stage 3.   3. Hyperkalemia, secondary to AKI, improved.  4. Elevated calcium, with elevated vitamin D level, improved.  5. Exacerbation of COPD.    Plan:  1. Holding aldactone for now. Avoid vitamin D.  2. Off IVF, lasix dose was decreased to 40 po daily due to rising Cr.  3. Mild unilateral hydro on [place], consider GU eval if Cr continues to rise  4. Renal diet with oral fluid restriction of 1000 ml/24 hours.   5. Avoid hypotension, nephrotoxic drugs, Lovenox, Fleets enema and IV contrast exposure.  5. Follow up chemistries ordered for AM.    Thank you for the consultation. Please do not hesitate to contact us for any further questions/concerns. We will continue to follow along with you.       Electronically signed by Margret Chance, MD  on 09/22/2018 at 9:21 AM   [facility].  Ph: [phone]

## 2018-09-22 NOTE — Progress Notes (Signed)
Pulmonary Critical Care Progress Note  Orlena Sheldon, MD     Patient seen for the follow up of acute on chronic hypoxic respiratory failure requiring BiPAP, acute exacerbation of COPD/severe emphysema, subcentimeter pulmonary nodules, elevated d-dimer    Subjective:  She is sitting up in bedside chair.  She appears short of breath.  She states she is not short of breath at this time but only with activity.  She denies chest pain or any significant cough.  She really would like to go home.    Examination:  Vitals: BP (!) 131/57    Pulse 81    Temp 97.3 ??F (36.3 ??C) (Oral)    Resp 18    Ht 5\' 2"  (1.575 m)    Wt 85 lb 8 oz (38.8 kg)    SpO2 95%    BMI 15.64 kg/m??   General appearance: alert and cooperative with exam, Up in chair  Neck: No JVD  Lungs: Decreased air exchange, no wheezing  Heart: regular rate and rhythm, S1, S2 normal, no gallop  Abdomen: Soft, non tender, + BS  Extremities: no cyanosis or clubbing. No significant edema, Multiple abrasions to bilateral lower extremities    LABs:  CBC:   Recent Labs     09/21/18  0458 09/22/18  0500   WBC 15.9* 11.7*   HGB 12.2 11.2*   HCT 38.8 35.2*   PLT 220 181     BMP:   Recent Labs     09/21/18  0458 09/22/18  0500   NA 140 139   K 4.0 4.2   CO2 30 31   BUN 42* 46*   CREATININE 0.96* 1.08*   LABGLOM 56* 48*   GLUCOSE 92 132*     ABG:  Lab Results   Component Value Date    TCO2ART 27 09/17/2018    FIO2 30.0 09/17/2018       Lab Results   Component Value Date    POCPH 7.39 09/17/2018    POCPCO2 43 09/17/2018    POCPO2 81 09/17/2018    POCHCO3 25.9 09/17/2018    NBEA NOT REPORTED 09/17/2018    PBEA 1 09/17/2018    TCO2ART 27 09/17/2018    POCO2SAT 96 09/17/2018    FIO2 30.0 09/17/2018     Radiology:  09/22/2018      Impression:  ?? Acute on chronic hypoxic respiratory failure Quiring BiPAP therapy  ?? Acute exacerbation of COPD/severe emphysema/COVID-19  ?? Pulmonary nodules, subcentimeter  ?? Pulmonary edema  ?? Elevated d-dimer, CTA negative for PE  ?? Atypical chest  pain  ?? AKI/hyperkalemia  ?? CAD/AICD/CHF    Recommendations:  ?? Oxygen via nasal cannula  ?? Will need repeat home O2 testing prior to discharge  ?? BiPAP while asleep and as necessary  ?? Decrease IV Solu-Medrol  ?? Symbicort  ?? Albuterol and Ipratropium Q 4 hours and prn  ?? Diuresis with oral Lasix, nephrology following  ?? DVT prophylaxis with Subcuweight heparin  ?? Discharge planning in 24-48 hours, home versus SNF. DW patient in detail. She will talk to her family. I think patient will be better of going to ECF  ?? Will follow with you    Electronically signed by     Marlyce Huge, MD on 09/22/2018 at 2:44 PM  Pulmonary Critical Care and Sleep Medicine,  [facility]  Cell: [phone]  Office: [phone]

## 2018-09-22 NOTE — Plan of Care (Signed)
Problem: Falls - Risk of:  Goal: Will remain free from falls  Description: Will remain free from falls  09/22/2018 0017 by Clementeen Graham, RN  Outcome: Ongoing  Note: Fall risk assessment completed. Patient instructed to use call light. Bed locked and in lowest position, side rails up 2/4, call light and bedside table within reach, clutter removed, and non-skid footwear on when pt out of bed. Hourly rounds will continue.      Problem: Pain:  Goal: Pain level will decrease  Description: Pain level will decrease  09/22/2018 0017 by Clementeen Graham, RN  Outcome: Ongoing     Problem: Pain:  Goal: Control of acute pain  Description: Control of acute pain  09/22/2018 0017 by Clementeen Graham, RN  Outcome: Ongoing     Problem: Pain:  Goal: Control of chronic pain  Description: Control of chronic pain  09/22/2018 0017 by Clementeen Graham, RN  Outcome: Ongoing     Problem: Breathing Pattern - Ineffective:  Goal: Ability to achieve and maintain a regular respiratory rate will improve  Description: Ability to achieve and maintain a regular respiratory rate will improve  09/22/2018 0017 by Clementeen Graham, RN  Outcome: Ongoing     Problem: Respiratory:  Goal: Respiratory status will improve  Description: Respiratory status will improve  09/22/2018 0017 by Clementeen Graham, RN  Outcome: Ongoing     Problem: Skin Integrity:  Goal: Will show no infection signs and symptoms  Description: Will show no infection signs and symptoms  09/22/2018 0017 by Clementeen Graham, RN  Outcome: Ongoing     Problem: Skin Integrity:  Goal: Absence of new skin breakdown  Description: Absence of new skin breakdown  09/22/2018 0017 by Clementeen Graham, RN  Outcome: Ongoing

## 2018-09-22 NOTE — Progress Notes (Signed)
Progress note  [facility].,    Adult Hospitalist      Name: Katherine Cunningham  MRN: [id number]     Acct: [id number]  Room: [id number]    Admit Date: 09/17/2018  8:28 AM  PCP: Gwenette Greet, MD    Primary Problem  Active Problems:    CHF (congestive heart failure), NYHA class II, acute, combined (HCC)  Resolved Problems:    * No resolved hospital problems. *        Assesment:     ?? Acute combined systolic and diastolic CHF  ?? Elevated troponin  ?? Acute COPD exacerbation  ?? Acute respiratory insufficiency  ?? Right lung base pulmonary nodule  ?? Hyperkalemia  ?? Acute renal insufficiency  ?? Hypothyroidism  ?? Cardiomyopathy status post AICD  ?? History of bladder cancer  ?? Anxiety         Plan:     ?? Patient is on step down unit  ?? O2 to maintain oxygen saturation greater than 92%  ?? Serial troponin elevated  ?? Echocardiogram shows EF 25% and diastolic dysfunction  ?? IV Zithromax   ?? holding Aldactone  ?? Decrease Lasix to 40 mg p.o. daily  ?? IV Solu-Medrol  ?? Duo nebs  ?? Continue amiodarone  ?? Continue Synthroid, Remeron, Mirapex  ?? Cardiology consult, input noted  ?? Pulmonology consult, input noted  ?? Nephrology consult, input noted  ?? DVT and GI prophylaxis  ?? Consult SW  ?? Plan SNF stay- pt looking through options   ?? Had refused and wanted to go home  ?? Counseled - is ready to go to SNF again now        Chief Complaint:     Chief Complaint   Patient presents with   ??? Shortness of Breath     unable to crae for self at home         History of Present Illness:      Patient seen and examined at bedside.  Shortness of breath is improved  potassium improved    No acute events overnight   afebrile   Denies any  headache, dizziness, cough, cold, changes in urination or bowel habits    Dyspnea at rest, wheezing    HPI:    Katherine Cunningham is a 85 y.o.  female who presents with Shortness of Breath (unable to crae for self at home)    85 year old lady with past medical history of CHF, coronary disease status post AICD, COPD,  hypothyroidism presented to ER complaining of shortness of breath.  Patient lives at home with her husband who has dementia.  Patient noticed to have progressive shortness of breath with worsening in the past 24 hours.  Patient denies any fever, chills, cough, nausea, vomiting, change urination, bowel habit.  Patient uses 2 L home O2 at night.  On presentation to ER sheWas tachypneic with respiratory rate in 34 and was requiring 2 to 4 L to maintain her oxygen saturation.  There is a concern by paramedics that patient is probably not able to take care of herself at home.On presentation her BNP was 8642, her troponin was 40, her potassium was 5.5, creatinine was 1.02.  Her LDH was 310, ferritin was 269, d-dimer was 3.12.  Her WBC was 12,000.  Her COVID test was negative.  She has a CTA chest which was negative for PE but shows severe emphysematous changes, pulmonary nodules at the right lung base, bronchiectasis..  Patient admitted with cardiology and  pulmonology consult for further management.  I have personally reviewed the past medical history, past surgical history, medications, social history, and family history, and summarized in the note.    Review of Systems:     All 10 point system is reviewed and negative otherwise mentioned in HPI.      Past Medical History:     Past Medical History:   Diagnosis Date   ??? Bladder cancer (HCC)     remission   ??? CHF (congestive heart failure) (HCC)     stage 4   ??? COPD (chronic obstructive pulmonary disease) (HCC)    ??? Neck injury     has injections done for disk issue   ??? Thyroid disease         Past Surgical History:     Past Surgical History:   Procedure Laterality Date   ??? PACEMAKER PLACEMENT      with defibrillator        Medications Prior to Admission:       Prior to Admission medications    Medication Sig Start Date End Date Taking? Authorizing Provider   amiodarone (CORDARONE) 200 MG tablet Take 1 tablet by mouth daily   Yes Historical Provider, MD   ferrous sulfate  (IRON 325) 325 (65 Fe) MG tablet Take 1 tablet by mouth daily   Yes Historical Provider, MD   ipratropium-albuterol (DUONEB) 0.5-2.5 (3) MG/3ML SOLN nebulizer solution Take 1 nebule by mouth every 8 hours as needed   Yes Historical Provider, MD   levothyroxine (SYNTHROID) 25 MCG tablet Take 1 tablet by mouth daily   Yes Historical Provider, MD   lidocaine (LIDODERM) 5 % Place 1 patch onto the skin as needed 11/17/17  Yes Historical Provider, MD   mirtazapine (REMERON) 15 MG tablet Take 15 mg by mouth nightly  08/31/17  Yes Historical Provider, MD   pramipexole (MIRAPEX) 0.25 MG tablet Take 1 tablet by mouth daily as needed  12/30/17  Yes Historical Provider, MD   budesonide-formoterol (SYMBICORT) 160-4.5 MCG/ACT AERO Take 2 puffs by mouth 2 times daily   Yes Historical Provider, MD   albuterol sulfate HFA (PROAIR HFA) 108 (90 Base) MCG/ACT inhaler Take 2 puffs by mouth every 4 hours as needed   Yes Historical Provider, MD   tiotropium (SPIRIVA RESPIMAT) 1.25 MCG/ACT AERS inhaler Take 2 puffs by mouth daily   Yes Historical Provider, MD   spironolactone (ALDACTONE) 25 MG tablet Take 0.5 tablets by mouth daily   Yes Historical Provider, MD   cyclobenzaprine (FLEXERIL) 10 MG tablet Take 1 tablet by mouth 3 times daily    Historical Provider, MD   silver sulfADIAZINE (SILVADENE) 1 % cream Apply 1 inch topically as needed 01/21/18   Historical Provider, MD   acetaminophen-codeine (TYLENOL/CODEINE #3) 300-30 MG per tablet Take 1 tablet by mouth every 8 hours as needed. 11/17/17   Historical Provider, MD        Allergies:       Aspirin and Penicillins    Social History:     Tobacco:    reports that she quit smoking about 15 years ago. She has never used smokeless tobacco.  Alcohol:      reports no history of alcohol use.  Drug Use:  reports no history of drug use.    Family History:     History reviewed. No pertinent family history.      Physical Exam:     Vitals:  BP 139/66    Pulse 112  Temp 97.5 ??F (36.4 ??C) (Oral)    Resp  16    Ht  (1.575 m)    Wt 85 lb 8 oz (38.8 kg)    SpO2 92%    BMI 15.64 kg/m??   Temp (24hrs), Avg:97.6 ??F (36.4 ??C), Min:97.3 ??F (36.3 ??C), Max:97.7 ??F (36.5 ??C)          General appearance - alert, frail, and in no moderate distress  Mental status - oriented to person, place, and time with normal affect  Head - normocephalic and atraumatic  Eyes - pupils equal and reactive, extraocular eye movements intact, conjunctiva clear  Ears - hearing appears to be intact  Nose - no drainage noted  Mouth - mucous membranes moist  Neck - supple, no carotid bruits, thyroid not palpable  Chest -basal crackles, wheezing, increased effort  Heart - normal rate, regular rhythm, no murmur  Abdomen - soft, nontender, nondistended, bowel sounds present all four quadrants, no masses, hepatomegaly or splenomegaly  Neurological - normal speech, no focal findings or movement disorder noted, cranial nerves II through XII grossly intact  Extremities -2+ peripheral edema  Skin - no gross lesions, rashes, or induration noted        Data:     Labs:    Hematology:  Recent Labs     09/20/18  0520 09/21/18  0458 09/22/18  0500   WBC 17.7* 15.9* 11.7*   RBC 3.70* 3.83* 3.53*   HGB 11.8* 12.2 11.2*   HCT 37.0 38.8 35.2*   MCV 100.0 101.3 99.7   MCH 31.9 31.9 31.7   MCHC 31.9 31.4 31.8   RDW 15.9* 15.9* 15.7*   PLT 206 220 181   MPV 9.1 9.8 9.5     Chemistry:  Recent Labs     09/20/18  0520 09/21/18  0458 09/22/18  0500   NA 139 140 139   K 4.2 4.0 4.2   CL 97* 96* 95*   CO2 GLUCOSE 168* 92 132*   BUN 46* 42* 46*   CREATININE 1.13* 0.96* 1.08*   MG 2.2 2.3 2.2   ANIONGAP LABGLOM 46* 56* 48*   GFRAA 56* >60 59*   CALCIUM 9.4 9.6 8.9   PHOS 3.3 2.9 3.2   PROBNP 10,678* 15,894* 16,633*     Recent Labs     09/21/18  1119 09/21/18  1645 09/21/18  2021 09/22/18  0743 09/22/18  1126 09/22/18  1627   POCGLU 167* 140* 148* 124* 189* 148*       No results found for: INR, PROTIME    Lab Results   Component Value Date/Time    SPECIAL  RAC 09/17/2018 09:26 AM     Lab Results   Component Value Date/Time    CULTURE NO GROWTH 5 DAYS 09/17/2018 09:26 AM       Lab Results   Component Value Date    POCPH 7.39 09/17/2018    POCPCO2 43 09/17/2018    POCPO2 81 09/17/2018    POCHCO3 25.9 09/17/2018    NBEA NOT REPORTED 09/17/2018    PBEA 1 09/17/2018    TCO2ART 27 09/17/2018    POCO2SAT 96 09/17/2018    FIO2 30.0 09/17/2018       Radiology:    Xr Chest Portable    Result Date: 09/17/2018  Borderline cardiac size.  Findings of generalized COPD with appearance of biapical scarring.  Opacity at the right  base laterally which may represent focal airspace disease.     Ct Chest Pulmonary Embolism W Contrast    Result Date: 09/17/2018  1.  No evidence of pulmonary embolus. 2.  Severe emphysematous changes in the lungs. 3.  Pulmonary nodules at the right lung base. 4.  Bronchiectasis.  No focal consolidation or effusion. 5.  Atherosclerotic disease. RECOMMENDATIONS: Fleischner Society guidelines for follow-up and management of incidentally detected pulmonary nodules: Multiple Solid Nodules: Nodule size greater than 8 mm In a low-risk patient, CT at 3-6 months, then consider CT at 18-24 months. In a high-risk patient, CT at 3-6 months, then CT at 18-24 months. - Low risk patients include individuals with minimal or absent history of smoking and other known risk factors. - High risk patients include individuals with a history or smoking or known risk factors. Radiology 2017 http://pubs.https://www.stafford-myers.com/rsna.org/doi/full/10.1148/radiol.1610960454517-164-8662         All radiological studies reviewed                Code Status:  Full Code    Electronically signed by Joanne CharsJahangir Rosemae Mcquown, MD on 09/22/2018 at 6:23 PM     Copy sent to Dr. Gwenette Greetalla, Rekha, MD    This note was created with the assistance of a speech-recognition program.  Although the intention is to generate a document that actually reflects the content of the visit, no guarantees can be provided that every mistake has been identified and  corrected by editing.     Note was updated later by me after  physical examination and  completion of the assessment.

## 2018-09-22 NOTE — Progress Notes (Signed)
Physical Therapy  Facility/Department: [facility]  Daily Treatment Note  NAME: Katherine Cunningham  DOB: [birth year]  MRN: [id number]    Date of Service: 09/22/2018    Discharge Recommendations:  Subacute/Skilled Nursing Facility        Assessment   Body structures, Functions, Activity limitations: Decreased functional mobility ;Decreased safe awareness;Decreased endurance;Decreased balance;Decreased strength  Assessment: Pt continues with decreased aerobic/ cardiac tolerance and limited mobility.  Pt requires continued  PT to maximize independence with functional mobility, balance, safety awareness & activity tolerance.  Pt HIGH risk for falls with very limited activity tolerance & recommend SNF at D/C to return to baseline function and a safe D/C back to home environment.  Specific instructions for Next Treatment: gait training w/ O2;   Prognosis: Good  Exam: functional mobility, activity tolerance, Balance, & [employer] AM-PAC 6 Clicks Basic Mobility   PT Education: Goals;Plan of Editor, commissioning;Disease Specific Education;PT Role  Patient Education: Safe functional mobility, prevention of sedentary complications, ex's & breathing techniques  REQUIRES PT FOLLOW UP: Yes     Patient Diagnosis(es): The primary encounter diagnosis was COPD exacerbation (HCC). Diagnoses of Chronic systolic congestive heart failure (HCC), Shortness of breath, and Hypoxia were also pertinent to this visit.     has a past medical history of Bladder cancer (HCC), CHF (congestive heart failure) (HCC), COPD (chronic obstructive pulmonary disease) (HCC), Neck injury, and Thyroid disease.   has a past surgical history that includes pacemaker placement.    Restrictions  Restrictions/Precautions  Restrictions/Precautions: Up as Tolerated, General Precautions, Fall Risk  Required Braces or Orthoses?: No  Position Activity Restriction  Other position/activity restrictions: Telemetry, up with assist using RW, O2  NC  Subjective      Vital Signs  BP Location: Right Arm  Oxygen Therapy  O2 Device: Nasal cannula       Orientation   WFL  Cognition   Cognition  Overall Cognitive Status: Exceptions  Arousal/Alertness: Appropriate responses to stimuli  Following Commands: Follows one step commands with repetition;Follows one step commands with increased time  Attention Span: Appears intact  Memory: Decreased recall of recent events;Appears intact  Safety Judgement: Decreased awareness of need for safety;Decreased awareness of need for assistance  Problem Solving: Assistance required to generate solutions;Assistance required to implement solutions;Decreased awareness of errors;Assistance required to correct errors made  Insights: Decreased awareness of deficits  Initiation: Requires cues for some  Sequencing: Requires cues for some  Objective   Bed mobility  Rolling to Left: Contact guard assistance  Rolling to Right: Contact guard assistance  Supine to Sit: Minimal assistance  Scooting: Stand by assistance  Comment: cues to reach to bedrail & safety with multiple lines   Pt sat at EOB for 5 minutes to rest after bed mobility & prepare lines for standing & ambulation.    Transfers  Sit to Stand: Minimal Assistance  Stand to sit: Minimal Assistance  Bed to Chair: Minimal assistance  Stand Pivot Transfers: Contact guard assistance  Lateral Transfers: Contact guard assistance  Comment: cues to use UB from surface & not from arms of walker & to keep walker close before reaching to sit  Ambulation  Ambulation?: Yes  Ambulation 1  Device: Rolling Walker  Assistance: Minimal assistance  Quality of Gait: step to pattern, easily SOB  Gait Deviations: Decreased step length;Decreased step height;Shuffles(UE support required for balance. )  Distance: 43ft; Pt amb to BR for toileting, Contact Guard Assistance to sit to toilet.  Pt stood with  Contact Guard Assistance,stood 3 minutes for pericare & to pull up brief, amb 332ft to sink for hand hygiene  x 2 minutes then ambulated 15 ft with R/walker & sat to chair with Contact Guard Assistance          Balance  Posture: Good  Sitting - Static: Good  Sitting - Dynamic: Good  Standing - Static: Good;-  Standing - Dynamic: Fair;+  Exercises  Comments: seated LE ex's x 10 reps, sit to stands x 3; deep breathing x 10 reps.           All lines intact, call light within reach, and patient positioned comfortably at end of treatment.  All patient needs addressed prior to ending therapy session.                       G-Code     OutComes Score                                                     AM-PAC Score    AM-PAC Inpatient Mobility Raw Score : 16  AM-PAC Inpatient T-Scale Score : 40.78   Mobility Inpatient CMS 0-100% Score: 54.16  Mobility Inpatient CMS G-Code Modifier: CK                Goals  Short term goals  Time Frame for Short term goals: 12 tx's  Short term goal 1: Bed mobility independnet  Short term goal 2: Transfers independent  Short term goal 3: Amb x 100 ft w/ O2 via n.c. independent w/ rwalker  Short term goal 4: Pt issued HEP for safe home program.   Patient Goals   Patient goals : Pt goal is to get home asap    Plan    Plan  Times per week: daily.  5-6x/week   Specific instructions for Next Treatment: gait training w/ O2;   Current Treatment Recommendations: Strengthening, Location managerBalance Training, Teacher, early years/preTransfer Training, Teaching laboratory technicianndurance Training, Investment banker, operationalGait Training, Home Exercise Program, Equities traderatient/Caregiver Education & Training, Patent attorneyafety Education & Training, Museum/gallery curatortair training, Psychologist, educationalCognitive Reorientation, Ecologistunctional Mobility Training  Safety Devices  Type of devices: All fall risk precautions in place, Positioning belt, Left in bed, Call light within reach     Therapy Time   Individual Concurrent Group Co-treatment   Time In 1320         Time Out 1359         Minutes 39                 Donyea Beverlin A Nikola Blackston, PT

## 2018-09-22 NOTE — Progress Notes (Signed)
[facility] Cardiology  Attending Progress Note    Date:  09/22/2018  Patient: Katherine Cunningham ,  08-18-1932  Age:  85 y.o.  Admission:  09/17/2018  8:28 AM  Length of stay  LOS: 5 days   Admit DX: CHF (congestive heart failure), NYHA class II, acute, combined (HCC) [I50.41]      Assessment:  1. Acute on chronic, systolic, congestive heart failure  2. Severe dilated cardiomyopathy, EF 25%  3. Ventricular tachycardia, suppressed by amiodarone  4. Advanced COPD, with exacerbation  5. Acute renal insufficiency, nephrology on board    Plan:  Her diuretics are being adjusted daily by nephrology.  She is off Aldactone due to elevated potassium.  Continues to be short of breath, thinks it is because of her back pain.  As per prior discussion, there is not much more that can be offered from a cardiac standpoint.     ==============================================================    Chief Complaint: Shortness of breath, seen for congestive heart failure    Subjective:   The patient was seen and examined. Notes and labs reviewed.  There were no complications over night.    Patient's cardiac review of systems: Denies chest pain.  Shortness of breath persists but unchanged..  The patient is generally feeling unchanged.    Objective:   BP 123/61    Pulse 76    Temp 97.7 ??F (36.5 ??C) (Oral)    Resp 20    Ht 5\' 2"  (1.575 m)    Wt 85 lb 8 oz (38.8 kg)    SpO2 96%    BMI 15.64 kg/m??     Intake/Output Summary (Last 24 hours) at 09/22/2018 0847  Last data filed at 09/22/2018 0134  Gross per 24 hour   Intake 840 ml   Output 900 ml   Net -60 ml       CONSTITUTIONAL:  awake, alert, cooperative, no apparent distress  HEAD:  Head is atraumatic, normocephalic. Eyes and oral mucosa are normal.  LUNGS: Diminished air exchange bilaterally.  CARDIOVASCULAR: Regular rhythm, normal S1 and S2, and 1/6 systolic murmur at the left sternal border and apex  ABDOMEN:  Soft, nontender, nondistended.   EXTREMITIES: Trace bilaterally lower  extremities  NEUROLOGIC: Grossly nonfocal    Labs:  CBC:  Recent Labs     09/21/18  0458 09/22/18  0500   WBC 15.9* 11.7*   HGB 12.2 11.2*   HCT 38.8 35.2*   PLT 220 181     Magnesium:  Recent Labs     09/21/18  0458 09/22/18  0500   MG 2.3 2.2     BMP:  Recent Labs     09/21/18  0458 09/22/18  0500   NA 140 139   K 4.0 4.2   CALCIUM 9.6 8.9   CO2 30 31   BUN 42* 46*   CREATININE 0.96* 1.08*   LABGLOM 56* 48*   GLUCOSE 92 132*     BNP:No results for input(s): BNP in the last 72 hours.  PT/INR:No results for input(s): PROTIME, INR in the last 72 hours.  APTT:No results for input(s): APTT in the last 72 hours.  CARDIAC ENZYMES:  Recent Labs     09/19/18  1112   CKTOTAL 145   TROPHS 33*     FASTING LIPID PANEL:No results found for: HDL, LDLDIRECT, LDLCALC, TRIG  LIVER PROFILE:No results for input(s): AST, ALT, LABALBU, ALKPHOS, BILITOT, BILIDIR, IBILI, PROT, GLOB, ALBUMIN in the last 72 hours.  Current Meds:   ??? lidocaine  1 patch Transdermal Daily   ??? methylPREDNISolone  40 mg Intravenous Q8H   ??? ipratropium-albuterol  1 ampule Inhalation Q4H WA   ??? furosemide  40 mg Oral Daily   ??? sodium chloride flush  10 mL Intravenous BID   ??? sodium chloride flush  10 mL Intravenous 2 times per day   ??? amiodarone  200 mg Oral Daily   ??? cyclobenzaprine  10 mg Oral TID   ??? ferrous sulfate  325 mg Oral Daily   ??? levothyroxine  25 mcg Oral Daily   ??? mirtazapine  15 mg Oral Nightly   ??? budesonide-formoterol  2 puff Inhalation BID   ??? famotidine  20 mg Oral Daily   ??? heparin (porcine)  5,000 Units Subcutaneous BID     Continuous Infusions:   ??? dextrose         Electronically signed Godfrey Pick, MD 8:47 AM 09/22/2018

## 2018-09-23 LAB — CBC WITH AUTO DIFFERENTIAL
Absolute Eos #: 0 10*3/uL (ref 0.00–0.44)
Absolute Immature Granulocyte: 0.29 10*3/uL (ref 0.00–0.30)
Absolute Lymph #: 0.14 10*3/uL — ABNORMAL LOW (ref 1.10–3.70)
Absolute Mono #: 0.72 10*3/uL (ref 0.10–1.20)
Basophils Absolute: 0 10*3/uL (ref 0.00–0.20)
Basophils: 0 % (ref 0–2)
Eosinophils %: 0 % — ABNORMAL LOW (ref 1–4)
Hematocrit: 36.1 % — ABNORMAL LOW (ref 36.3–47.1)
Hemoglobin: 11.4 g/dL — ABNORMAL LOW (ref 11.9–15.1)
Immature Granulocytes: 2 % — ABNORMAL HIGH
Lymphocytes: 1 % — ABNORMAL LOW (ref 24–43)
MCH: 31.5 pg (ref 25.2–33.5)
MCHC: 31.6 g/dL (ref 28.4–34.8)
MCV: 99.7 fL (ref 82.6–102.9)
MPV: 9.7 fL (ref 8.1–13.5)
Monocytes: 5 % (ref 3–12)
NRBC Automated: 0 per 100 WBC
Platelets: 184 10*3/uL (ref 138–453)
RBC: 3.62 m/uL — ABNORMAL LOW (ref 3.95–5.11)
RDW: 15.5 % — ABNORMAL HIGH (ref 11.8–14.4)
Seg Neutrophils: 92 % — ABNORMAL HIGH (ref 36–65)
Segs Absolute: 13.15 10*3/uL — ABNORMAL HIGH (ref 1.50–8.10)
WBC: 14.3 10*3/uL — ABNORMAL HIGH (ref 3.5–11.3)

## 2018-09-23 LAB — BASIC METABOLIC PANEL
Anion Gap: 13 mmol/L (ref 9–17)
BUN: 51 mg/dL — ABNORMAL HIGH (ref 8–23)
Bun/Cre Ratio: 48 — ABNORMAL HIGH (ref 9–20)
CO2: 31 mmol/L (ref 20–31)
Calcium: 8.8 mg/dL (ref 8.6–10.4)
Chloride: 97 mmol/L — ABNORMAL LOW (ref 98–107)
Creatinine: 1.07 mg/dL — ABNORMAL HIGH (ref 0.50–0.90)
GFR African American: 59 mL/min — ABNORMAL LOW (ref >60)
GFR Non-African American: 49 mL/min — ABNORMAL LOW (ref >60)
Glucose: 127 mg/dL — ABNORMAL HIGH (ref 70–99)
Potassium: 3.8 mmol/L (ref 3.7–5.3)
Sodium: 141 mmol/L (ref 135–144)

## 2018-09-23 LAB — POC GLUCOSE FINGERSTICK
POC Glucose: 196 mg/dL — ABNORMAL HIGH (ref 65–105)
POC Glucose: 150 mg/dL — ABNORMAL HIGH (ref 65–105)
POC Glucose: 168 mg/dL — ABNORMAL HIGH (ref 65–105)
POC Glucose: 172 mg/dL — ABNORMAL HIGH (ref 65–105)

## 2018-09-23 LAB — MAGNESIUM: Magnesium: 2.3 mg/dL (ref 1.6–2.6)

## 2018-09-23 LAB — BRAIN NATRIURETIC PEPTIDE: Pro-BNP: 16230 pg/mL — ABNORMAL HIGH (ref <300)

## 2018-09-23 LAB — PHOSPHORUS: Phosphorus: 3.5 mg/dL (ref 2.6–4.5)

## 2018-09-23 LAB — CULTURE, BLOOD 1
Culture: NO GROWTH
Culture: NO GROWTH

## 2018-09-23 LAB — PTH-RELATED PEPTIDE: PTH Related Peptide: 3.3 pmol/L (ref 0.0–3.4)

## 2018-09-23 MED ORDER — PREDNISONE 20 MG PO TABS
20 MG | Freq: Two times a day (BID) | ORAL | Status: DC
Start: 2018-09-23 — End: 2018-09-25
  Administered 2018-09-23 – 2018-09-24 (×3): 20 mg via ORAL

## 2018-09-23 MED ORDER — OXYCODONE-ACETAMINOPHEN 5-325 MG PO TABS
5-325 MG | ORAL | Status: DC | PRN
Start: 2018-09-23 — End: 2018-09-25
  Administered 2018-09-24 (×2): 0.5 via ORAL

## 2018-09-23 MED ORDER — TRAMADOL HCL 50 MG PO TABS
50 MG | Freq: Four times a day (QID) | ORAL | Status: DC | PRN
Start: 2018-09-23 — End: 2018-09-25
  Administered 2018-09-23 – 2018-09-24 (×2): 50 mg via ORAL

## 2018-09-23 MED FILL — AMIODARONE HCL 200 MG PO TABS: 200 MG | ORAL | Qty: 1

## 2018-09-23 MED FILL — SOLU-MEDROL 40 MG IJ SOLR: 40 MG | INTRAMUSCULAR | Qty: 40

## 2018-09-23 MED FILL — CYCLOBENZAPRINE HCL 10 MG PO TABS: 10 MG | ORAL | Qty: 1

## 2018-09-23 MED FILL — IPRATROPIUM-ALBUTEROL 0.5-2.5 (3) MG/3ML IN SOLN: RESPIRATORY_TRACT | Qty: 3

## 2018-09-23 MED FILL — ASPERCREME LIDOCAINE 4 % EX PTCH: 4 % | CUTANEOUS | Qty: 1

## 2018-09-23 MED FILL — MIRTAZAPINE 15 MG PO TABS: 15 MG | ORAL | Qty: 1

## 2018-09-23 MED FILL — OXYCODONE-ACETAMINOPHEN 5-325 MG PO TABS: 5-325 MG | ORAL | Qty: 1

## 2018-09-23 MED FILL — HEPARIN SODIUM (PORCINE) 5000 UNIT/ML IJ SOLN: 5000 UNIT/ML | INTRAMUSCULAR | Qty: 1

## 2018-09-23 MED FILL — SENNA 8.6 MG PO TABS: 8.6 MG | ORAL | Qty: 1

## 2018-09-23 MED FILL — ACETAMINOPHEN 325 MG PO TABS: 325 MG | ORAL | Qty: 2

## 2018-09-23 MED FILL — FUROSEMIDE 40 MG PO TABS: 40 MG | ORAL | Qty: 1

## 2018-09-23 MED FILL — FAMOTIDINE 20 MG PO TABS: 20 MG | ORAL | Qty: 1

## 2018-09-23 MED FILL — FERROUS SULFATE 325 (65 FE) MG PO TBEC: 325 (65 Fe) MG | ORAL | Qty: 1

## 2018-09-23 MED FILL — TRAMADOL HCL 50 MG PO TABS: 50 MG | ORAL | Qty: 1

## 2018-09-23 MED FILL — LEVOTHYROXINE SODIUM 25 MCG PO TABS: 25 MCG | ORAL | Qty: 1

## 2018-09-23 MED FILL — PREDNISONE 20 MG PO TABS: 20 MG | ORAL | Qty: 1

## 2018-09-23 NOTE — Care Coordination-Inpatient (Addendum)
SW met with pt to follow up on discharge plan, SW explained the therapy team recommendations regarding SNF.  Pt is agreeable to SNF going to [facility] if Bunk Foss her daughter in law is agreeable.  SW attempted to contact Jenny Reichmann to discuss discharge plan, no answer SW left message. SW called [employer] to initiate insurance pre-cert.    SW following     Addendum    SW received return call from [place] pt daughter in Sports coach regarding checking [facility] for bed availability and benefits. SW faxed referral     SW received call from [place] with [facility] regarding the pt has been accepted and the insurance pre-cert will be started. SW called [facility] to inform, Fanny Skates report she will have Autumn call SW back due to being on another line.     SW updated [facility] intake regarding stopping the insurance pre-cert due to pt going to [facility] [place] now due to family request.

## 2018-09-23 NOTE — Care Coordination-Inpatient (Signed)
Discharge planning    Chart reviewed. SS was following for placement but patient is in discussion with daughter in law [place] regarding home with home care.     Patient lives at home with spouse. She has current referral to [facility] caring . She has home 02 with portability but unsure o the supplier. She also has cane and RW in the home.     Will follow up with patient this am to see if can find out patient 02 supplier to verify her current rx and determine if final poc Is home care vs snf.     NEPHRO  Assessment:  1. Acute kidney injury, appears to be hemodynamically related.  2. CKD stage 3.   3. Hyperkalemia, secondary to AKI, improved.  1. Holding aldactone for now. Avoid vitamin D.  2. Off IVF, lasix dose was decreased to 40 po daily due to rising Cr.  3. Mild unilateral hydro on [place], consider GU eval if Cr continues to rise  4. Renal diet with oral fluid restriction of 1000 ml/24 hours.       CARD  Assessment:  1. Acute on chronic, systolic, congestive heart failure  2. Severe dilated cardiomyopathy, EF 25%  3. Ventricular tachycardia, suppressed by amiodarone  4. Advanced COPD, with exacerbation  5. Acute renal insufficiency, nephrology on board  ??  Plan:  Her diuretics are being adjusted daily by nephrology.  She is off Aldactone due to elevated potassium.  Continues to be short of breath, thinks it is because of her back pain.  As per prior discussion, there is not much more that can be offered from a cardiac standpoint.      PULM  Impression:  ?? Acute on chronic hypoxic respiratory failure Quiring BiPAP therapy  ?? Acute exacerbation of COPD/severe emphysema/COVID-19  ?? Pulmonary nodules, subcentimeter  ?? Pulmonary edema  ?? Elevated d-dimer, CTA negative for PE  Recommendations:  ?? Oxygen via nasal cannula  ?? Will need repeat home O2 testing prior to discharge  ?? BiPAP while asleep and as necessary  ?? Decrease IV Solu-Medrol  ?? Symbicort  ?? Albuterol and Ipratropium Q 4 hours and prn  ?? Diuresis with oral Lasix,  nephrology following

## 2018-09-23 NOTE — Progress Notes (Signed)
Requested that Dr.Adil speak to the patient regarding code status after the patient spoke to Pallative care a few days ago. She was not interested in recitative measures

## 2018-09-23 NOTE — Other (Signed)
Patient awake while reclining in the bed. Patient reports some pain in her neck. Patient reports family support and has spoken to family on the phone. Patient requests a prayer. Patient states that she belongs to [facility]. Writer offers a prayer for pain relief, healing, and rest. Patient expresses appreciation for the visit. Spiritual Care will follow as needed.       09/23/18 1730   Encounter Summary   Services provided to: Patient   Referral/Consult From: Palliative Care;Rounding   Support System Children   Continue Visiting   (09/23/18)   Complexity of Encounter Low   Length of Encounter 15 minutes   Spiritual Assessment Completed Yes   Routine   Type Initial   Assessment Approachable   Intervention Active listening;Explored feelings, thoughts, concerns;Explored coping resources;Nurtured hope;Prayer   Outcome Expressed gratitude

## 2018-09-23 NOTE — Progress Notes (Signed)
Reason for Consult:  Acute kidney injury.    Interval Hx:  Cr stable around 1.07  BP OK  Dyspnea stable on 2L by NC    HISTORY OF PRESENT ILLNESS:    The patient is a 85 y.o. female who presents with shortness of breath. She had iv contrast with CTA chest today. No previous labs available in our system for comparison.. On admission she was noted to have elevated creatinine of 1.02 with a potassium level of 5.5. Presently on BiPAP. Denies any problems with nausea, vomiting, appetite, diarrhea or difficulty with urination. Denies any recent use of NSAIDs.     Prior to Admission medications    Medication Sig Start Date End Date Taking? Authorizing Provider   amiodarone (CORDARONE) 200 MG tablet Take 1 tablet by mouth daily   Yes Historical Provider, MD   ferrous sulfate (IRON 325) 325 (65 Fe) MG tablet Take 1 tablet by mouth daily   Yes Historical Provider, MD   ipratropium-albuterol (DUONEB) 0.5-2.5 (3) MG/3ML SOLN nebulizer solution Take 1 nebule by mouth every 8 hours as needed   Yes Historical Provider, MD   levothyroxine (SYNTHROID) 25 MCG tablet Take 1 tablet by mouth daily   Yes Historical Provider, MD   lidocaine (LIDODERM) 5 % Place 1 patch onto the skin as needed 11/17/17  Yes Historical Provider, MD   mirtazapine (REMERON) 15 MG tablet Take 15 mg by mouth nightly  08/31/17  Yes Historical Provider, MD   pramipexole (MIRAPEX) 0.25 MG tablet Take 1 tablet by mouth daily as needed  12/30/17  Yes Historical Provider, MD   budesonide-formoterol (SYMBICORT) 160-4.5 MCG/ACT AERO Take 2 puffs by mouth 2 times daily   Yes Historical Provider, MD   albuterol sulfate HFA (PROAIR HFA) 108 (90 Base) MCG/ACT inhaler Take 2 puffs by mouth every 4 hours as needed   Yes Historical Provider, MD   tiotropium (SPIRIVA RESPIMAT) 1.25 MCG/ACT AERS inhaler Take 2 puffs by mouth daily   Yes Historical Provider, MD   spironolactone (ALDACTONE) 25 MG tablet Take 0.5 tablets by mouth daily   Yes Historical Provider, MD   cyclobenzaprine  (FLEXERIL) 10 MG tablet Take 1 tablet by mouth 3 times daily    Historical Provider, MD   silver sulfADIAZINE (SILVADENE) 1 % cream Apply 1 inch topically as needed 01/21/18   Historical Provider, MD   acetaminophen-codeine (TYLENOL/CODEINE #3) 300-30 MG per tablet Take 1 tablet by mouth every 8 hours as needed. 11/17/17   Historical Provider, MD       Scheduled Meds:  ??? predniSONE  20 mg Oral BID   ??? lidocaine  1 patch Transdermal Daily   ??? ipratropium-albuterol  1 ampule Inhalation Q4H WA   ??? furosemide  40 mg Oral Daily   ??? sodium chloride flush  10 mL Intravenous BID   ??? sodium chloride flush  10 mL Intravenous 2 times per day   ??? amiodarone  200 mg Oral Daily   ??? cyclobenzaprine  10 mg Oral TID   ??? ferrous sulfate  325 mg Oral Daily   ??? levothyroxine  25 mcg Oral Daily   ??? mirtazapine  15 mg Oral Nightly   ??? budesonide-formoterol  2 puff Inhalation BID   ??? famotidine  20 mg Oral Daily   ??? heparin (porcine)  5,000 Units Subcutaneous BID      Physical Exam:  Vitals:    09/23/18 0633 09/23/18 0751 09/23/18 1123 09/23/18 1527   BP: (!) 115/51  Marland Kitchen)  119/50 (!) 117/51   Pulse: 82  88 80   Resp: 18  16 16    Temp: 97.5 ??F (36.4 ??C)  98.1 ??F (36.7 ??C) 97.3 ??F (36.3 ??C)   TempSrc: Oral  Oral Oral   SpO2: 97% 96% 91% 100%   Weight:       Height:         I/O last 3 completed shifts:  In: -   Out: 850 [Urine:850]    General:  Awake, alert, not in distress. Appears to be stated age.  HEENT: Atraumatic, normocephalic. Anicteric sclera. Pink and moist oral mucosa. No carotid bruit. No JVD.  Chest: Bilateral air entry, clear to auscultation, no wheezing, rhonchi or rales.  Cardiovascular: RRR, S1S2, no murmur, rub or gallop. No lower extremity edema.    Abdomen: Soft, non tender to palpation.   Musculoskeletal:  No cyanosis or clubbing.  Integumentary: Pink, warm and dry. Free from rash or lesions. Skin turgor normal.  CNS: Speech clear. Face symmetrical. No tremor.     Data:  CBC:   Lab Results   Component Value Date    WBC 14.3  (H) 09/23/2018    HGB 11.4 (L) 09/23/2018    HCT 36.1 (L) 09/23/2018    MCV 99.7 09/23/2018    PLT 184 09/23/2018     BMP:    Lab Results   Component Value Date    NA 141 09/23/2018    NA 139 09/22/2018    NA 140 09/21/2018    K 3.8 09/23/2018    K 4.2 09/22/2018    K 4.0 09/21/2018    CL 97 (L) 09/23/2018    CL 95 (L) 09/22/2018    CL 96 (L) 09/21/2018    CO2 31 09/23/2018    CO2 31 09/22/2018    CO2 30 09/21/2018    BUN 51 (H) 09/23/2018    BUN 46 (H) 09/22/2018    BUN 42 (H) 09/21/2018    CREATININE 1.07 (H) 09/23/2018    CREATININE 1.08 (H) 09/22/2018    CREATININE 0.96 (H) 09/21/2018    GLUCOSE 127 (H) 09/23/2018    GLUCOSE 132 (H) 09/22/2018    GLUCOSE 92 09/21/2018     CMP:   Lab Results   Component Value Date    NA 141 09/23/2018    K 3.8 09/23/2018    CL 97 09/23/2018    CO2 31 09/23/2018    BUN 51 09/23/2018    CREATININE 1.07 09/23/2018    GLUCOSE 127 09/23/2018    CALCIUM 8.8 09/23/2018    PROT 6.9 09/18/2018    LABALBU 4.1 09/17/2018    BILITOT 0.14 09/17/2018    ALKPHOS 103 09/17/2018    AST 36 09/17/2018    ALT 22 09/17/2018      Hepatic:   Lab Results   Component Value Date    AST 36 (H) 09/17/2018    ALT 22 09/17/2018    BILITOT 0.14 (L) 09/17/2018    ALKPHOS 103 09/17/2018     BNP: No results found for: BNP  Lipids: No results found for: CHOL, HDL  INR: No results found for: INR  PTH: No results found for: PTH  Phosphorus:    Lab Results   Component Value Date    PHOS 3.5 09/23/2018     Ionized Calcium: No results found for: IONCA  Magnesium:   Lab Results   Component Value Date    MG 2.3 09/23/2018     Albumin:  Lab Results   Component Value Date    LABALBU 4.1 09/17/2018     Last 3 CK, CKMB, Troponin: @LABRCNT (CKTOTAL:3,CKMB:3,TROPONINI:3)       URINE:)No results found for: Ofilia Neas     Radiology:   Reviewed.    Assessment:  1. Acute kidney injury, appears to be hemodynamically related.  2. CKD stage 3.   3. Hyperkalemia, secondary to AKI, improved.  4. Elevated calcium, with elevated  vitamin D level, improved.  5. Exacerbation of COPD.    Plan:  Holding aldactone for now. Avoid vitamin D.  Off IVF, okay to continue Lasix  40 po daily   Electrolytes are okay   Calcium was corrected  Mild unilateral hydro on [place], consider GU eval if Cr continues to rise  Renal diet with oral fluid restriction of 1000 ml/24 hours.   Avoid hypotension, nephrotoxic drugs, Lovenox, Fleets enema and IV contrast exposure.  Follow up chemistries ordered for AM.    We will continue to follow along with you.       Electronically signed by Lynne Leader, MD  on 09/23/2018 at 6:37 PM   [facility].  Ph: [phone]

## 2018-09-23 NOTE — Progress Notes (Signed)
Occupational Therapy  Facility/Department: [facility]  Daily Treatment Note  NAME: Katherine Cunningham  DOB: 04/07/1933  MRN: [id number]    Date of Service: 09/23/2018    Discharge Recommendations:  Subacute/Skilled Nursing Facility       Assessment   Performance deficits / Impairments: Decreased functional mobility ;Decreased ADL status;Decreased strength;Decreased safe awareness;Decreased balance;Decreased cognition;Decreased endurance;Decreased posture  Prognosis: Fair  REQUIRES OT FOLLOW UP: Yes  Activity Tolerance  Activity Tolerance: Patient limited by fatigue;Treatment limited secondary to medical complications   Activity Tolerance: Poor  Safety Devices  Safety Devices in place: Yes  Type of devices: Patient at risk for falls;Left in chair;Call light within reach;Nurse notified;Gait belt         Patient Diagnosis(es): The primary encounter diagnosis was COPD exacerbation (HCC). Diagnoses of Chronic systolic congestive heart failure (HCC), Shortness of breath, and Hypoxia were also pertinent to this visit.      has a past medical history of Bladder cancer (HCC), CHF (congestive heart failure) (HCC), COPD (chronic obstructive pulmonary disease) (HCC), Neck injury, and Thyroid disease.   has a past surgical history that includes pacemaker placement.    Restrictions  Restrictions/Precautions  Restrictions/Precautions: Up as Tolerated, General Precautions, Fall Risk  Required Braces or Orthoses?: No  Position Activity Restriction  Other position/activity restrictions: Telemetry, up with assist using RW, O2 NC  Subjective   General  Chart Reviewed: Yes  Patient assessed for rehabilitation services?: Yes  Response to previous treatment: Patient with no complaints from previous session  Family / Caregiver Present: No      Orientation  Orientation  Overall Orientation Status: Within Functional Limits  Objective    ADL  Grooming: Set up seated to brush teeth and wash face.  Toileting: Maximum assistance(Max A to  change brief, independent with hygiene after voiding)  Additional Comments: Pt is VERY limited due to fatigue/weakness and SOB. POOR activity tolerance noted.        Balance  Sitting Balance: Supervision  Standing Balance: Contact guard assistance  Functional Mobility  Functional - Mobility Device: Rolling Walker  Activity: Other  Assist Level: Contact guard assistance  Functional Mobility Comments: Pt completed functional mobility around room using RW with CGA for safety and writer managed O2 tubing.  Copywriter, advertisingToilet Transfers  Toilet - Technique: Stand step  Equipment Used: Standard Chartered certified accountantbedside commode  Toilet Transfer: Contact guard assistance  Toilet Transfers Comments: Verbal cues for safety/hand placement.  Bed mobility  Supine to Sit: Minimal assistance  Scooting: Stand by assistance  Transfers  Stand Step Transfers: Contact guard assistance  Sit to stand: Contact guard assistance  Stand to sit: Contact guard assistance  Transfer Comments: Verbal cues for safety/hand placement.                       Cognition  Overall Cognitive Status: Exceptions  Arousal/Alertness: Appropriate responses to stimuli  Following Commands: Follows one step commands with repetition  Attention Span: Appears intact  Memory: Decreased recall of recent events  Safety Judgement: Decreased awareness of need for safety  Problem Solving: Assistance required to identify errors made;Assistance required to correct errors made  Insights: Decreased awareness of deficits  Initiation: Requires cues for some  Sequencing: Requires cues for some     Perception  Overall [facility]: [facility]  Plan   Plan  Times per week: 4-5x/week, 1-2x/day  Current Treatment Recommendations: Strengthening, Balance Training, Building services engineer, Endurance Training, Psychologist, educational, Equipment Evaluation, Education, Sports administrator, Equities trader, Engineer, petroleum / ADL, Art therapist, Engineer, water, Positioning                        AM-PAC Score        AM-PAC Inpatient Daily Activity Raw Score: 17 (09/23/18 1225)  AM-PAC Inpatient ADL T-Scale Score : 37.26 (09/23/18 1225)  ADL Inpatient CMS 0-100% Score: 50.11 (09/23/18 1225)  ADL Inpatient CMS G-Code Modifier : CK (09/23/18 1225)    Goals  Short term goals  Time Frame for Short term goals: by discharge, pt will  Short term goal 1: demo SBA with ADL transfers with good safety, AD/DME as needed  Short term goal 2: demo SBA with functional mob in room with good safety for ADL completion with good safety/pacing and approp aD  Short term goal 3: demo SBA with UB ADLs and min A LB ADLs with good safety/pacing, DME as needed  Short term goal 4: demo CGA with toileting routine with good safety  Short term goal 5: demo and verb good understanding of fall prevention techs, EC/WS techs, B UE HEP, and possible equip needs   Patient Goals   Patient goals : to go  home when able       Therapy Time   Individual Concurrent Group Co-treatment   Time In 1158         Time Out 1214         Minutes 16          Attempted to see pt at 7:43, pt in bed and refused therapy. Writer returned at 11:58 and pt reluctantly agreed to get out of bed. PCT stated pt might be agreeable to shower, pt refused when Clinical research associate offered.       Jackquline Denmark, OTA

## 2018-09-23 NOTE — Progress Notes (Signed)
Progress note  [facility]    Adult Hospitalist      Name: Katherine Cunningham  MRN: [id number]     Acct: [id number]  Room: [id number]    Admit Date: 09/17/2018  8:28 AM  PCP: Gwenette Greet, MD    Primary Problem  Active Problems:    CHF (congestive heart failure), NYHA class II, acute, combined (HCC)  Resolved Problems:    * No resolved hospital problems. *        Assesment:     ?? Acute combined systolic and diastolic CHF  ?? Elevated troponin  ?? Acute COPD exacerbation  ?? Acute respiratory insufficiency  ?? Right lung base pulmonary nodule  ?? Hyperkalemia  ?? Acute renal insufficiency  ?? Hypothyroidism  ?? Cardiomyopathy status post AICD  ?? History of bladder cancer  ?? Anxiety         Plan:     ?? Patient is on step down unit  ?? O2 to maintain oxygen saturation greater than 92%  ?? Serial troponin elevated  ?? Echocardiogram shows EF 25% and diastolic dysfunction  ?? IV Zithromax   ?? holding Aldactone  ?? Decrease Lasix to 40 mg p.o. daily  ?? IV Solu-Medrol  ?? Duo nebs  ?? Continue amiodarone  ?? Continue Synthroid, Remeron, Mirapex  ?? Cardiology consult, input noted  ?? Pulmonology consult, input noted  ?? Nephrology consult, input noted  ?? DVT and GI prophylaxis  ?? Consult SW  ?? Plan SNF stay- pt looking through options   ?? Had refused and wanted to go home  ?? Counseled - is ready to go to SNF again now        Chief Complaint:     Chief Complaint   Patient presents with   ??? Shortness of Breath     unable to crae for self at home         History of Present Illness:      Patient seen and examined at bedside.  Shortness of breath is improved  potassium improved    No acute events overnight   afebrile   Denies any  headache, dizziness, cough, cold, changes in urination or bowel habits    Dyspnea at rest, wheezing some    HPI:    Katherine Cunningham is a 85 y.o.  female who presents with Shortness of Breath (unable to crae for self at home)    85 year old lady with past medical history of CHF, coronary disease status post AICD,  COPD, hypothyroidism presented to ER complaining of shortness of breath.  Patient lives at home with her husband who has dementia.  Patient noticed to have progressive shortness of breath with worsening in the past 24 hours.  Patient denies any fever, chills, cough, nausea, vomiting, change urination, bowel habit.  Patient uses 2 L home O2 at night.  On presentation to ER sheWas tachypneic with respiratory rate in 34 and was requiring 2 to 4 L to maintain her oxygen saturation.  There is a concern by paramedics that patient is probably not able to take care of herself at home.On presentation her BNP was 8642, her troponin was 40, her potassium was 5.5, creatinine was 1.02.  Her LDH was 310, ferritin was 269, d-dimer was 3.12.  Her WBC was 12,000.  Her COVID test was negative.  She has a CTA chest which was negative for PE but shows severe emphysematous changes, pulmonary nodules at the right lung base, bronchiectasis..  Patient admitted with cardiology  and pulmonology consult for further management.  I have personally reviewed the past medical history, past surgical history, medications, social history, and family history, and summarized in the note.    Review of Systems:     All 10 point system is reviewed and negative otherwise mentioned in HPI.      Past Medical History:     Past Medical History:   Diagnosis Date   ??? Bladder cancer (HCC)     remission   ??? CHF (congestive heart failure) (HCC)     stage 4   ??? COPD (chronic obstructive pulmonary disease) (HCC)    ??? Neck injury     has injections done for disk issue   ??? Thyroid disease         Past Surgical History:     Past Surgical History:   Procedure Laterality Date   ??? PACEMAKER PLACEMENT      with defibrillator        Medications Prior to Admission:       Prior to Admission medications    Medication Sig Start Date End Date Taking? Authorizing Provider   amiodarone (CORDARONE) 200 MG tablet Take 1 tablet by mouth daily   Yes Historical Provider, MD   ferrous  sulfate (IRON 325) 325 (65 Fe) MG tablet Take 1 tablet by mouth daily   Yes Historical Provider, MD   ipratropium-albuterol (DUONEB) 0.5-2.5 (3) MG/3ML SOLN nebulizer solution Take 1 nebule by mouth every 8 hours as needed   Yes Historical Provider, MD   levothyroxine (SYNTHROID) 25 MCG tablet Take 1 tablet by mouth daily   Yes Historical Provider, MD   lidocaine (LIDODERM) 5 % Place 1 patch onto the skin as needed 11/17/17  Yes Historical Provider, MD   mirtazapine (REMERON) 15 MG tablet Take 15 mg by mouth nightly  08/31/17  Yes Historical Provider, MD   pramipexole (MIRAPEX) 0.25 MG tablet Take 1 tablet by mouth daily as needed  12/30/17  Yes Historical Provider, MD   budesonide-formoterol (SYMBICORT) 160-4.5 MCG/ACT AERO Take 2 puffs by mouth 2 times daily   Yes Historical Provider, MD   albuterol sulfate HFA (PROAIR HFA) 108 (90 Base) MCG/ACT inhaler Take 2 puffs by mouth every 4 hours as needed   Yes Historical Provider, MD   tiotropium (SPIRIVA RESPIMAT) 1.25 MCG/ACT AERS inhaler Take 2 puffs by mouth daily   Yes Historical Provider, MD   spironolactone (ALDACTONE) 25 MG tablet Take 0.5 tablets by mouth daily   Yes Historical Provider, MD   cyclobenzaprine (FLEXERIL) 10 MG tablet Take 1 tablet by mouth 3 times daily    Historical Provider, MD   silver sulfADIAZINE (SILVADENE) 1 % cream Apply 1 inch topically as needed 01/21/18   Historical Provider, MD   acetaminophen-codeine (TYLENOL/CODEINE #3) 300-30 MG per tablet Take 1 tablet by mouth every 8 hours as needed. 11/17/17   Historical Provider, MD        Allergies:       Aspirin and Penicillins    Social History:     Tobacco:    reports that she quit smoking about 15 years ago. She has never used smokeless tobacco.  Alcohol:      reports no history of alcohol use.  Drug Use:  reports no history of drug use.    Family History:     History reviewed. No pertinent family history.      Physical Exam:     Vitals:  BP (!) 115/51  Pulse 82    Temp 97.5 ??F (36.4 ??C)  (Oral)    Resp 18    Ht 5\' 2"  (1.575 m)    Wt 93 lb 12.8 oz (42.5 kg)    SpO2 96%    BMI 17.16 kg/m??   Temp (24hrs), Avg:97.5 ??F (36.4 ??C), Min:97.3 ??F (36.3 ??C), Max:97.7 ??F (36.5 ??C)          General appearance - alert, frail, and in no moderate distress  Mental status - oriented to person, place, and time with normal affect  Head - normocephalic and atraumatic  Eyes - pupils equal and reactive, extraocular eye movements intact, conjunctiva clear  Ears - hearing appears to be intact  Nose - no drainage noted  Mouth - mucous membranes moist  Neck - supple, no carotid bruits, thyroid not palpable  Chest -basal crackles, wheezing, increased effort  Heart - normal rate, regular rhythm, no murmur  Abdomen - soft, nontender, nondistended, bowel sounds present all four quadrants, no masses, hepatomegaly or splenomegaly  Neurological - normal speech, no focal findings or movement disorder noted, cranial nerves II through XII grossly intact  Extremities -2+ peripheral edema  Skin - no gross lesions, rashes, or induration noted        Data:     Labs:    Hematology:  Recent Labs     09/21/18  0458 09/22/18  0500 09/23/18  0441   WBC 15.9* 11.7* 14.3*   RBC 3.83* 3.53* 3.62*   HGB 12.2 11.2* 11.4*   HCT 38.8 35.2* 36.1*   MCV 101.3 99.7 99.7   MCH 31.9 31.7 31.5   MCHC 31.4 31.8 31.6   RDW 15.9* 15.7* 15.5*   PLT 220 181 184   MPV 9.8 9.5 9.7     Chemistry:  Recent Labs     09/21/18  0458 09/22/18  0500 09/23/18  0441   NA 140 139 141   K 4.0 4.2 3.8   CL 96* 95* 97*   CO2 30 31 31    GLUCOSE 92 132* 127*   BUN 42* 46* 51*   CREATININE 0.96* 1.08* 1.07*   MG 2.3 2.2 2.3   ANIONGAP 14 13 13    LABGLOM 56* 48* 49*   GFRAA >60 59* 59*   CALCIUM 9.6 8.9 8.8   PHOS 2.9 3.2 3.5   PROBNP 15,894* 16,633* 16,230*     Recent Labs     09/21/18  2021 09/22/18  0743 09/22/18  1126 09/22/18  1627 09/22/18  1957 09/23/18  0718   POCGLU 148* 124* 189* 148* 196* 150*       No results found for: INR, PROTIME    Lab Results   Component Value  Date/Time    SPECIAL 12ML RAC 09/17/2018 09:26 AM     Lab Results   Component Value Date/Time    CULTURE NO GROWTH 5 DAYS 09/17/2018 09:26 AM       Lab Results   Component Value Date    POCPH 7.39 09/17/2018    POCPCO2 43 09/17/2018    POCPO2 81 09/17/2018    POCHCO3 25.9 09/17/2018    NBEA NOT REPORTED 09/17/2018    PBEA 1 09/17/2018    TCO2ART 27 09/17/2018    POCO2SAT 96 09/17/2018    FIO2 30.0 09/17/2018       Radiology:    Xr Chest Portable    Result Date: 09/17/2018  Borderline cardiac size.  Findings of generalized COPD with appearance of biapical scarring.  Opacity at the right base laterally which may represent focal airspace disease.     Ct Chest Pulmonary Embolism W Contrast    Result Date: 09/17/2018  1.  No evidence of pulmonary embolus. 2.  Severe emphysematous changes in the lungs. 3.  Pulmonary nodules at the right lung base. 4.  Bronchiectasis.  No focal consolidation or effusion. 5.  Atherosclerotic disease. RECOMMENDATIONS: Fleischner Society guidelines for follow-up and management of incidentally detected pulmonary nodules: Multiple Solid Nodules: Nodule size greater than 8 mm In a low-risk patient, CT at 3-6 months, then consider CT at 18-24 months. In a high-risk patient, CT at 3-6 months, then CT at 18-24 months. - Low risk patients include individuals with minimal or absent history of smoking and other known risk factors. - High risk patients include individuals with a history or smoking or known risk factors. Radiology 2017 http://pubs.https://www.stafford-myers.[email or web address]         All radiological studies reviewed                Code Status:  Full Code    Electronically signed by Joanne Chars, MD on 09/23/2018 at 7:58 AM     Copy sent to Dr. Gwenette Greet, MD    This note was created with the assistance of a speech-recognition program.  Although the intention is to generate a document that actually reflects the content of the visit, no guarantees can be provided that every mistake has  been identified and corrected by editing.     Note was updated later by me after  physical examination and  completion of the assessment.

## 2018-09-23 NOTE — Progress Notes (Signed)
Physical Therapy  DATE: 09/23/2018    NAME: Katherine Cunningham  MRN: [id number]   DOB: 1932/07/01    Patient not seen this date for Physical Therapy due to:  []  Blood transfusion in progress  []  Cancel by RN  []  Hemodialysis  [x]   Refusal by Patient, just got BTB after ambulating to BR & wants to rest   []  Spine Precautions   []  Strict Bedrest  []  Surgery  []  Testing      []  Other        []  PT being discontinued at this time. Patient independent. No further needs.   []  PT being discontinued at this time as the patient has been transferred to hospice care. No further needs.    Staci Carver A Lise Auer, PT

## 2018-09-23 NOTE — Progress Notes (Signed)
Pulmonary Critical Care Progress Note  Katherine Agee, CNP / Dr. Orlena Sheldon, M.D.     Patient seen for the follow up of respiratory failure, COPD exacerbation, pulmonary nodules  Subjective:  She is resting in bed, she is short of breath, not quite back to baseline.  She has occasional cough.  She denies any chest pain.  She is fairly drowsy, pain medications leaving her feeling fairly sleepy during the day.  Her intake remains quite poor.    Length of stay: 6 Days    Examination:  Vitals: BP (!) 115/51   Pulse 82   Temp 97.5 F (36.4 C) (Oral)   Resp 18   Ht 5\' 2"  (1.575 m)   Wt 93 lb 12.8 oz (42.5 kg)   SpO2 96%   BMI 17.16 kg/m     General appearance: alert and cooperative with exam, drowsy, resting in bed in no distress  Neck: No JVD  Lungs: Very poor air exchange with no wheeze  Heart: regular rate and rhythm, S1, S2 normal, no gallop  Abdomen: Soft, non tender, + BS  Extremities: no cyanosis or clubbing. No significant edema    LABs:  CBC:   Recent Labs     09/22/18  0500 09/23/18  0441   WBC 11.7* 14.3*   HGB 11.2* 11.4*   HCT 35.2* 36.1*   PLT 181 184     BMP:   Recent Labs     09/22/18  0500 09/23/18  0441   NA 139 141   K 4.2 3.8   CO2 31 31   BUN 46* 51*   CREATININE 1.08* 1.07*   LABGLOM 48* 49*   GLUCOSE 132* 127*     Impression:   Acute on chronic hypoxic respiratory failure   Acute exacerbation of COPD/severe emphysema   75-pack-year smoking history   Subcentimeter pulmonary nodules   Acute combined systolic and diastolic heart failure, EF 25%   Atypical chest pain   AKI/hyperkalemia   Protein calorie malnutrition    Recommendations:   Oxygen by nasal cannula   Home O2 evaluation   Albuterol and Ipratropium Q 4 hours and prn   Symbicort 160   Prednisone taper   Consult dietary   Diuresis per nephrology   DVT prophylaxis with low molecular weight heparin   Incentive spirometry every hour while awake   Palliative care on consult   Recommend follow-up CT chest in 6 to 8  weeks as outpatient to follow-up on pulmonary nodules.  She may need biopsy in the future.   Discharge planning   Discussed with RN   Will follow with you    Electronically signed by     Elizabeth Sauer, APRN - CNP on 09/23/2018 at 10:55 AM  Patient was seen under the supervision of Dr. Orlena Sheldon  Pulmonary Critical Care and Sleep Medicine,    [facility]: [phone]

## 2018-09-23 NOTE — Plan of Care (Signed)
Problem: Falls - Risk of:  Goal: Will remain free from falls  Description: Will remain free from falls  Outcome: Ongoing  Siderails up x 2  Hourly rounding.  Call light in reach.  Instructed to call for assist before attempting out of bed.  Remains free from falls and accidental injury at this time.   Floor free from obstacles, and bed is locked and in lowest position. Adequate lighting provided.  Bed alarm on. Fall sticker on wristband. Fall Sign posted in doorway    Goal: Absence of physical injury  Description: Absence of physical injury  Outcome: Ongoing     Problem: Pain:  Goal: Pain level will decrease  Description: Pain level will decrease  Outcome: Ongoing  Goal: Control of acute pain  Description: Control of acute pain  Outcome: Ongoing  Goal: Control of chronic pain  Description: Control of chronic pain  Outcome: Ongoing     Problem: Breathing Pattern - Ineffective:  Goal: Ability to achieve and maintain a regular respiratory rate will improve  Description: Ability to achieve and maintain a regular respiratory rate will improve  Outcome: Ongoing  Patient has SOB at rest and greater with exertion  . Wears 2L NC at all times, even      Problem: Respiratory:  Goal: Respiratory status will improve  Description: Respiratory status will improve  Outcome: Ongoing     Problem: Skin Integrity:  Goal: Will show no infection signs and symptoms  Description: Will show no infection signs and symptoms  Outcome: Ongoing  Goal: Absence of new skin breakdown  Description: Absence of new skin breakdown  Outcome: Ongoing

## 2018-09-24 LAB — CBC WITH AUTO DIFFERENTIAL
Absolute Eos #: 0 10*3/uL (ref 0.00–0.44)
Absolute Immature Granulocyte: 0.16 10*3/uL (ref 0.00–0.30)
Absolute Lymph #: 0.32 10*3/uL — ABNORMAL LOW (ref 1.10–3.70)
Absolute Mono #: 0.97 10*3/uL (ref 0.10–1.20)
Basophils Absolute: 0 10*3/uL (ref 0.00–0.20)
Basophils: 0 % (ref 0–2)
Eosinophils %: 0 % — ABNORMAL LOW (ref 1–4)
Hematocrit: 36.1 % — ABNORMAL LOW (ref 36.3–47.1)
Hemoglobin: 11.2 g/dL — ABNORMAL LOW (ref 11.9–15.1)
Immature Granulocytes: 1 % — ABNORMAL HIGH
Lymphocytes: 2 % — ABNORMAL LOW (ref 24–43)
MCH: 31.4 pg (ref 25.2–33.5)
MCHC: 31 g/dL (ref 28.4–34.8)
MCV: 101.1 fL (ref 82.6–102.9)
MPV: 9.9 fL (ref 8.1–13.5)
Monocytes: 6 % (ref 3–12)
NRBC Automated: 0 per 100 WBC
Platelets: 189 10*3/uL (ref 138–453)
RBC: 3.57 m/uL — ABNORMAL LOW (ref 3.95–5.11)
RDW: 15.2 % — ABNORMAL HIGH (ref 11.8–14.4)
Seg Neutrophils: 91 % — ABNORMAL HIGH (ref 36–65)
Segs Absolute: 14.65 10*3/uL — ABNORMAL HIGH (ref 1.50–8.10)
WBC: 16.1 10*3/uL — ABNORMAL HIGH (ref 3.5–11.3)

## 2018-09-24 LAB — BRAIN NATRIURETIC PEPTIDE: Pro-BNP: 11508 pg/mL — ABNORMAL HIGH (ref <300)

## 2018-09-24 LAB — BASIC METABOLIC PANEL
Anion Gap: 12 mmol/L (ref 9–17)
BUN: 45 mg/dL — ABNORMAL HIGH (ref 8–23)
Bun/Cre Ratio: 50 — ABNORMAL HIGH (ref 9–20)
CO2: 31 mmol/L (ref 20–31)
Calcium: 8.7 mg/dL (ref 8.6–10.4)
Chloride: 97 mmol/L — ABNORMAL LOW (ref 98–107)
Creatinine: 0.9 mg/dL (ref 0.50–0.90)
GFR African American: >60 mL/min (ref >60)
GFR Non-African American: 60 mL/min — ABNORMAL LOW (ref >60)
Glucose: 128 mg/dL — ABNORMAL HIGH (ref 70–99)
Potassium: 4 mmol/L (ref 3.7–5.3)
Sodium: 140 mmol/L (ref 135–144)

## 2018-09-24 LAB — POC GLUCOSE FINGERSTICK
POC Glucose: 141 mg/dL — ABNORMAL HIGH (ref 65–105)
POC Glucose: 111 mg/dL — ABNORMAL HIGH (ref 65–105)
POC Glucose: 155 mg/dL — ABNORMAL HIGH (ref 65–105)
POC Glucose: 110 mg/dL — ABNORMAL HIGH (ref 65–105)

## 2018-09-24 LAB — PHOSPHORUS: Phosphorus: 2.8 mg/dL (ref 2.6–4.5)

## 2018-09-24 LAB — MAGNESIUM: Magnesium: 2.4 mg/dL (ref 1.6–2.6)

## 2018-09-24 MED ORDER — SENNOSIDES 8.6 MG PO TABS
8.6 MG | ORAL_TABLET | Freq: Two times a day (BID) | ORAL | 0 refills | Status: AC | PRN
Start: 2018-09-24 — End: 2018-10-24

## 2018-09-24 MED ORDER — FUROSEMIDE 40 MG PO TABS
40 MG | ORAL_TABLET | Freq: Every day | ORAL | 3 refills | Status: AC
Start: 2018-09-24 — End: ?

## 2018-09-24 MED ORDER — TRAMADOL HCL 50 MG PO TABS
50 MG | ORAL_TABLET | Freq: Four times a day (QID) | ORAL | 0 refills | Status: AC | PRN
Start: 2018-09-24 — End: 2018-09-27

## 2018-09-24 MED ORDER — CALCIUM CARBONATE ANTACID 500 MG PO CHEW
500 MG | ORAL_TABLET | Freq: Three times a day (TID) | ORAL | 0 refills | Status: AC | PRN
Start: 2018-09-24 — End: 2018-10-24

## 2018-09-24 MED ORDER — NITROGLYCERIN 0.4 MG SL SUBL
0.4 MG | ORAL_TABLET | SUBLINGUAL | 3 refills | Status: AC
Start: 2018-09-24 — End: ?

## 2018-09-24 MED ORDER — PREDNISONE 20 MG PO TABS
20 MG | ORAL_TABLET | Freq: Two times a day (BID) | ORAL | 0 refills | Status: AC
Start: 2018-09-24 — End: 2018-09-27

## 2018-09-24 MED ORDER — FAMOTIDINE 20 MG PO TABS
20 MG | ORAL_TABLET | Freq: Every day | ORAL | 3 refills | Status: AC
Start: 2018-09-24 — End: ?

## 2018-09-24 MED FILL — TRAMADOL HCL 50 MG PO TABS: 50 MG | ORAL | Qty: 1

## 2018-09-24 MED FILL — PRAMIPEXOLE DIHYDROCHLORIDE 0.25 MG PO TABS: 0.25 MG | ORAL | Qty: 1

## 2018-09-24 MED FILL — FUROSEMIDE 40 MG PO TABS: 40 MG | ORAL | Qty: 1

## 2018-09-24 MED FILL — FERROUS SULFATE 325 (65 FE) MG PO TBEC: 325 (65 Fe) MG | ORAL | Qty: 1

## 2018-09-24 MED FILL — CALCIUM ANTACID 500 MG PO CHEW: 500 MG | ORAL | Qty: 1

## 2018-09-24 MED FILL — CYCLOBENZAPRINE HCL 10 MG PO TABS: 10 MG | ORAL | Qty: 1

## 2018-09-24 MED FILL — OXYCODONE-ACETAMINOPHEN 5-325 MG PO TABS: 5-325 MG | ORAL | Qty: 1

## 2018-09-24 MED FILL — IPRATROPIUM-ALBUTEROL 0.5-2.5 (3) MG/3ML IN SOLN: RESPIRATORY_TRACT | Qty: 3

## 2018-09-24 MED FILL — LEVOTHYROXINE SODIUM 25 MCG PO TABS: 25 MCG | ORAL | Qty: 1

## 2018-09-24 MED FILL — MIRTAZAPINE 15 MG PO TABS: 15 MG | ORAL | Qty: 1

## 2018-09-24 MED FILL — PREDNISONE 20 MG PO TABS: 20 MG | ORAL | Qty: 1

## 2018-09-24 MED FILL — ASPERCREME LIDOCAINE 4 % EX PTCH: 4 % | CUTANEOUS | Qty: 1

## 2018-09-24 MED FILL — FAMOTIDINE 20 MG PO TABS: 20 MG | ORAL | Qty: 1

## 2018-09-24 MED FILL — HEPARIN SODIUM (PORCINE) 5000 UNIT/ML IJ SOLN: 5000 UNIT/ML | INTRAMUSCULAR | Qty: 1

## 2018-09-24 MED FILL — ACETAMINOPHEN 325 MG PO TABS: 325 MG | ORAL | Qty: 2

## 2018-09-24 MED FILL — AMIODARONE HCL 200 MG PO TABS: 200 MG | ORAL | Qty: 1

## 2018-09-24 MED FILL — SENNA 8.6 MG PO TABS: 8.6 MG | ORAL | Qty: 1

## 2018-09-24 NOTE — Progress Notes (Signed)
Physical Therapy  Facility/Department: [facility]  Daily Treatment Note  NAME: Katherine Cunningham  DOB: Dec 07, 1932  MRN: [id number]    Date of Service: 09/24/2018    Discharge Recommendations:  Subacute/Skilled Nursing Facility        Assessment   Body structures, Functions, Activity limitations: Decreased functional mobility ;Decreased safe awareness;Decreased endurance;Decreased balance;Decreased strength  Assessment: Pt continues with decreased aerobic/ cardiac tolerance and limited mobility.  Recommend continued rehab to maximize return of independent function.   Specific instructions for Next Treatment: gait training w/ O2;   PT Education: Goals;Plan of Editor, commissioningCare;Transfer Training;General Safety;Disease Specific Education;PT Role  Patient Education: Safe functional mobility, prevention of sedentary complications, ex's & breathing techniques  REQUIRES PT FOLLOW UP: Yes  Activity Tolerance  Activity Tolerance: Treatment limited secondary to medical complications (free text)     Patient Diagnosis(es): The primary encounter diagnosis was COPD exacerbation (HCC). Diagnoses of Chronic systolic congestive heart failure (HCC), Shortness of breath, and Hypoxia were also pertinent to this visit.     has a past medical history of Bladder cancer (HCC), CHF (congestive heart failure) (HCC), COPD (chronic obstructive pulmonary disease) (HCC), Neck injury, and Thyroid disease.   has a past surgical history that includes pacemaker placement.    Restrictions  Restrictions/Precautions  Restrictions/Precautions: Up as Tolerated, General Precautions, Fall Risk  Required Braces or Orthoses?: No  Position Activity Restriction  Other position/activity restrictions: Telemetry, up with assist using RW, O2 NC  Subjective   General  Chart Reviewed: Yes  Response To Previous Treatment: Patient with no complaints from previous session.  Family / Caregiver Present: No  Subjective  Subjective: Pt reporting inability to hold her head up while  sitting.   Pain Screening  Patient Currently in Pain: Yes  Vital Signs  Patient Currently in Pain: Yes       Objective   Bed mobility  Rolling to Left: Contact guard assistance  Rolling to Right: Contact guard assistance  Supine to Sit: Minimal assistance  Sit to Supine: Minimal assistance  Scooting: Minimal assistance  Transfers  Sit to Stand: Minimal Assistance  Stand to sit: Minimal Assistance  Bed to Chair: Minimal assistance  Stand Pivot Transfers: Minimal Assistance  Ambulation  Ambulation?: Yes  Ambulation 1  Device: Rolling Walker  Assistance: Minimal assistance  Quality of Gait: step to pattern, easily SOB  Gait Deviations: Decreased step length;Decreased step height;Shuffles  Distance: 2 ft     Balance  Posture: Good  Sitting - Static: Good  Sitting - Dynamic: Good  Standing - Static: Good;-  Standing - Dynamic: Fair;+  Exercises  Comments: too fatigued and in too much pain for exercises.          AM-PAC Score  AM-PAC Inpatient Mobility Raw Score : 15/24    Goals  Short term goals  Time Frame for Short term goals: 12 tx's  Short term goal 1: Bed mobility independnet  Short term goal 2: Transfers independent  Short term goal 3: Amb x 100 ft w/ O2 via n.c. independent w/ rwalker  Short term goal 4: Pt issued HEP for safe home program.   Patient Goals   Patient goals : Pt goal is to get home asap    Plan    Plan  Times per week: 1-2x/day.  5-6x/week   Specific instructions for Next Treatment: gait training w/ O2;   Current Treatment Recommendations: Strengthening, Location managerBalance Training, Teacher, early years/preTransfer Training, Teaching laboratory technicianndurance Training, Investment banker, operationalGait Training, Home Exercise Program, Equities traderatient/Caregiver Education & Training, Safety Education &  Training, Museum/gallery curator, Psychologist, educational, Ecologist Devices  Type of devices: All fall risk precautions in place, Positioning belt, Left in bed, Call light within reach     Therapy Time   Individual Concurrent Group Co-treatment   Time In 0838         Time Out  0848         Minutes 10            Nikolina Simerson, PT

## 2018-09-24 NOTE — Progress Notes (Signed)
Progress note  [facility].,    Adult Hospitalist      Name: Katherine Cunningham  MRN: [id number]     Acct: [id number]  Room: [id number]    Admit Date: 09/17/2018  8:28 AM  PCP: Gwenette Greet, MD    Primary Problem  Active Problems:    Severe malnutrition (HCC)    CHF (congestive heart failure), NYHA class II, acute, combined (HCC)  Resolved Problems:    * No resolved hospital problems. *        Assesment:     ?? Acute combined systolic and diastolic CHF  ?? Elevated troponin  ?? Acute COPD exacerbation  ?? Acute respiratory insufficiency  ?? Right lung base pulmonary nodule  ?? Hyperkalemia  ?? Acute renal insufficiency  ?? Hypothyroidism  ?? Cardiomyopathy status post AICD  ?? History of bladder cancer  ?? Osteoarthritis multiple joints  ?? Anxiety         Plan:     ?? Patient is on step down unit  ?? O2 to maintain oxygen saturation greater than 92%  ?? Serial troponin elevated  ?? Echocardiogram shows EF 25% and diastolic dysfunction  ?? IV Zithromax   ?? holding Aldactone  ?? Decrease Lasix to 40 mg p.o. daily  ?? IV Solu-Medrol  ?? Duo nebs  ?? Continue amiodarone  ?? Continue Synthroid, Remeron, Mirapex  ?? Cardiology consult, input noted  ?? Pulmonology consult, input noted  ?? Nephrology consult, input noted  ?? DVT and GI prophylaxis  ?? Consult SW  ?? Plan SNF stay- pt looking through options   ?? Had refused and wanted to go home  ?? Counseled - is ready to go to SNF again now  ?? D/W RN  ?? Extensive d/w son and daughter in law  ?? They are concerned w her narcotic med use for pain control  ?? Requesting need to see Dt. [place] her pain mgmt specialist once at SNF  ?? Counseled, discussed process  ?? All qts answered to their satisfaction  ?? Plan transfer to SNF if OK w all        Chief Complaint:     Chief Complaint   Patient presents with   ??? Shortness of Breath     unable to crae for self at home         History of Present Illness:      Patient seen and examined at bedside.  Shortness of breath is improved  potassium improved    No  acute events overnight   afebrile   Denies any  headache, dizziness, cough, cold, changes in urination or bowel habits    Dyspnea at rest  Feels better   Energy improved  Ate better     HPI:    Katherine Cunningham is a 85 y.o.  female who presents with Shortness of Breath (unable to crae for self at home)    85 year old lady with past medical history of CHF, coronary disease status post AICD, COPD, hypothyroidism presented to ER complaining of shortness of breath.  Patient lives at home with her husband who has dementia.  Patient noticed to have progressive shortness of breath with worsening in the past 24 hours.  Patient denies any fever, chills, cough, nausea, vomiting, change urination, bowel habit.  Patient uses 2 L home O2 at night.  On presentation to ER sheWas tachypneic with respiratory rate in 34 and was requiring 2 to 4 L to maintain her oxygen saturation.  There is  a concern by paramedics that patient is probably not able to take care of herself at home.On presentation her BNP was 8642, her troponin was 40, her potassium was 5.5, creatinine was 1.02.  Her LDH was 310, ferritin was 269, d-dimer was 3.12.  Her WBC was 12,000.  Her COVID test was negative.  She has a CTA chest which was negative for PE but shows severe emphysematous changes, pulmonary nodules at the right lung base, bronchiectasis..  Patient admitted with cardiology and pulmonology consult for further management.  I have personally reviewed the past medical history, past surgical history, medications, social history, and family history, and summarized in the note.    Review of Systems:     All 10 point system is reviewed and negative otherwise mentioned in HPI.      Past Medical History:     Past Medical History:   Diagnosis Date   ??? Bladder cancer (HCC)     remission   ??? CHF (congestive heart failure) (HCC)     stage 4   ??? COPD (chronic obstructive pulmonary disease) (HCC)    ??? Neck injury     has injections done for disk issue   ??? Thyroid  disease         Past Surgical History:     Past Surgical History:   Procedure Laterality Date   ??? PACEMAKER PLACEMENT      with defibrillator        Medications Prior to Admission:       Prior to Admission medications    Medication Sig Start Date End Date Taking? Authorizing Provider   amiodarone (CORDARONE) 200 MG tablet Take 1 tablet by mouth daily   Yes Historical Provider, MD   ferrous sulfate (IRON 325) 325 (65 Fe) MG tablet Take 1 tablet by mouth daily   Yes Historical Provider, MD   ipratropium-albuterol (DUONEB) 0.5-2.5 (3) MG/3ML SOLN nebulizer solution Take 1 nebule by mouth every 8 hours as needed   Yes Historical Provider, MD   levothyroxine (SYNTHROID) 25 MCG tablet Take 1 tablet by mouth daily   Yes Historical Provider, MD   lidocaine (LIDODERM) 5 % Place 1 patch onto the skin as needed 11/17/17  Yes Historical Provider, MD   mirtazapine (REMERON) 15 MG tablet Take 15 mg by mouth nightly  08/31/17  Yes Historical Provider, MD   pramipexole (MIRAPEX) 0.25 MG tablet Take 1 tablet by mouth daily as needed  12/30/17  Yes Historical Provider, MD   budesonide-formoterol (SYMBICORT) 160-4.5 MCG/ACT AERO Take 2 puffs by mouth 2 times daily   Yes Historical Provider, MD   albuterol sulfate HFA (PROAIR HFA) 108 (90 Base) MCG/ACT inhaler Take 2 puffs by mouth every 4 hours as needed   Yes Historical Provider, MD   tiotropium (SPIRIVA RESPIMAT) 1.25 MCG/ACT AERS inhaler Take 2 puffs by mouth daily   Yes Historical Provider, MD   spironolactone (ALDACTONE) 25 MG tablet Take 0.5 tablets by mouth daily   Yes Historical Provider, MD   cyclobenzaprine (FLEXERIL) 10 MG tablet Take 1 tablet by mouth 3 times daily    Historical Provider, MD   silver sulfADIAZINE (SILVADENE) 1 % cream Apply 1 inch topically as needed 01/21/18   Historical Provider, MD   acetaminophen-codeine (TYLENOL/CODEINE #3) 300-30 MG per tablet Take 1 tablet by mouth every 8 hours as needed. 11/17/17   Historical Provider, MD        Allergies:       Aspirin  and Penicillins  Social History:     Tobacco:    reports that she quit smoking about 15 years ago. She has never used smokeless tobacco.  Alcohol:      reports no history of alcohol use.  Drug Use:  reports no history of drug use.    Family History:     History reviewed. No pertinent family history.      Physical Exam:     Vitals:  BP (!) 111/50    Pulse 86    Temp 97.5 ??F (36.4 ??C) (Oral)    Resp 20    Ht  (1.575 m)    Wt 81 lb 1.6 oz (36.8 kg)    SpO2 91%    BMI 14.83 kg/m??   Temp (24hrs), Avg:97.7 ??F (36.5 ??C), Min:97.3 ??F (36.3 ??C), Max:98.1 ??F (36.7 ??C)          General appearance - alert, frail, and in no moderate distress  Mental status - oriented to person, place, and time with normal affect  Head - normocephalic and atraumatic  Eyes - pupils equal and reactive, extraocular eye movements intact, conjunctiva clear  Ears - hearing appears to be intact  Nose - no drainage noted  Mouth - mucous membranes moist  Neck - supple, no carotid bruits, thyroid not palpable  Chest -basal crackles, wheezing, increased effort  Heart - normal rate, regular rhythm, no murmur  Abdomen - soft, nontender, nondistended, bowel sounds present all four quadrants, no masses, hepatomegaly or splenomegaly  Neurological - normal speech, no focal findings or movement disorder noted, cranial nerves II through XII grossly intact  Extremities -2+ peripheral edema  Skin - no gross lesions, rashes, or induration noted        Data:     Labs:    Hematology:  Recent Labs     09/22/18  0500 09/23/18  0441 09/24/18  0549   WBC 11.7* 14.3* 16.1*   RBC 3.53* 3.62* 3.57*   HGB 11.2* 11.4* 11.2*   HCT 35.2* 36.1* 36.1*   MCV 99.7 99.7 101.1   MCH 31.7 31.5 31.4   MCHC 31.8 31.6 31.0   RDW 15.7* 15.5* 15.2*   PLT 181 184 189   MPV 9.5 9.7 9.9     Chemistry:  Recent Labs     09/22/18  0500 09/23/18  0441 09/24/18  0549   NA 139 141 140   K 4.2 3.8 4.0   CL 95* 97* 97*   CO2 GLUCOSE 132* 127* 128*   BUN 46* 51* 45*   CREATININE 1.08*  1.07* 0.90   MG 2.2 2.3 2.4   ANIONGAP LABGLOM 48* 49* 60*   GFRAA 59* 59* >60   CALCIUM 8.9 8.8 8.7   PHOS 3.2 3.5 2.8   PROBNP 16,633* 16,230* 11,508*     Recent Labs     09/23/18  1107 09/23/18  1616 09/23/18  1947 09/24/18  0802 09/24/18  1208 09/24/18  1705   POCGLU 168* 172* 141* 111* 155* 110*       No results found for: INR, PROTIME    Lab Results   Component Value Date/Time    SPECIAL RAC 09/17/2018 09:26 AM     Lab Results   Component Value Date/Time    CULTURE NO GROWTH 6 DAYS 09/17/2018 09:26 AM       Lab Results   Component Value Date    POCPH 7.39 09/17/2018  POCPCO2 43 09/17/2018    POCPO2 81 09/17/2018    POCHCO3 25.9 09/17/2018    NBEA NOT REPORTED 09/17/2018    PBEA 1 09/17/2018    TCO2ART 27 09/17/2018    POCO2SAT 96 09/17/2018    FIO2 30.0 09/17/2018       Radiology:    Xr Chest Portable    Result Date: 09/17/2018  Borderline cardiac size.  Findings of generalized COPD with appearance of biapical scarring.  Opacity at the right base laterally which may represent focal airspace disease.     Ct Chest Pulmonary Embolism W Contrast    Result Date: 09/17/2018  1.  No evidence of pulmonary embolus. 2.  Severe emphysematous changes in the lungs. 3.  Pulmonary nodules at the right lung base. 4.  Bronchiectasis.  No focal consolidation or effusion. 5.  Atherosclerotic disease. RECOMMENDATIONS: Fleischner Society guidelines for follow-up and management of incidentally detected pulmonary nodules: Multiple Solid Nodules: Nodule size greater than 8 mm In a low-risk patient, CT at 3-6 months, then consider CT at 18-24 months. In a high-risk patient, CT at 3-6 months, then CT at 18-24 months. - Low risk patients include individuals with minimal or absent history of smoking and other known risk factors. - High risk patients include individuals with a history or smoking or known risk factors. Radiology 2017 http://pubs.https://www.stafford-myers.com/.0272536644         All radiological studies  reviewed                Code Status:  Full Code    Electronically signed by Joanne Chars, MD on 09/24/2018 at 5:48 PM     Copy sent to Dr. Gwenette Greet, MD    This note was created with the assistance of a speech-recognition program.  Although the intention is to generate a document that actually reflects the content of the visit, no guarantees can be provided that every mistake has been identified and corrected by editing.     Note was updated later by me after  physical examination and  completion of the assessment.

## 2018-09-24 NOTE — Progress Notes (Signed)
Occupational Therapy  Facility/Department: [facility]  Daily Treatment Note  NAME: Katherine Cunningham  DOB: 22-Feb-1933  MRN: [id number]    Date of Service: 09/24/2018    Discharge Recommendations:  Subacute/Skilled Nursing Facility       Assessment   Performance deficits / Impairments: Decreased functional mobility ;Decreased ADL status;Decreased strength;Decreased safe awareness;Decreased balance;Decreased cognition;Decreased endurance;Decreased posture  Prognosis: Fair  OT Education: Energy Conservation;Home Exercise Program  Patient Education: Pt issued handout on energy conservation and cervical HEP. Pt in too much pain at this time to go over HEP. Will address at next session.  REQUIRES OT FOLLOW UP: Yes  Activity Tolerance  Activity Tolerance: Patient limited by fatigue;Treatment limited secondary to medical complications   Activity Tolerance: Poor  Safety Devices  Safety Devices in place: Yes  Type of devices: Patient at risk for falls;Left in chair;Call light within reach;Nurse notified;Gait belt         Patient Diagnosis(es): The primary encounter diagnosis was COPD exacerbation (HCC). Diagnoses of Chronic systolic congestive heart failure (HCC), Shortness of breath, and Hypoxia were also pertinent to this visit.      has a past medical history of Bladder cancer (HCC), CHF (congestive heart failure) (HCC), COPD (chronic obstructive pulmonary disease) (HCC), Neck injury, and Thyroid disease.   has a past surgical history that includes pacemaker placement.    Restrictions  Restrictions/Precautions  Restrictions/Precautions: Up as Tolerated, General Precautions, Fall Risk  Required Braces or Orthoses?: No  Position Activity Restriction  Other position/activity restrictions: Telemetry, up with assist using RW, O2 NC  Subjective   General  Chart Reviewed: Yes  Patient assessed for rehabilitation services?: Yes  Response to previous treatment: Patient with no complaints from previous session  Family / Caregiver  Present: No  Pain Assessment  Pain Assessment: 0-10  Pain Level: 10  Pain Type: Chronic pain  Pain Location: Neck  Pre Treatment Pain Screening  Intervention List: Nurse called to administer meds;Patient able to continue with treatment  Comments / Details: Pt has very poor tolerancef for being up in chair due to neck pain and SOB/fatigue.  Vital Signs  Patient Currently in Pain: Yes   Orientation  Orientation  Overall Orientation Status: Within Functional Limits  Objective    ADL  Grooming: Setup(seated to brush teeth and wash face)  Toileting: Contact guard assistance(standing at toilet to pull brief up/down, independent with hygiene after voiding)  Additional Comments: Pt is VERY limited due to fatigue/weakness and SOB. POOR activity tolerance noted. Pt declined shower stating "That will wear me out."         Balance  Sitting Balance: Supervision  Standing Balance: Contact guard assistance  Standing Balance  Time: less than 30 sec  Activity: mobility/toileting  Functional Mobility  Functional - Mobility Device: Rolling Walker  Activity: To/from bathroom  Assist Level: Contact guard assistance  Functional Mobility Comments: Pt completed functional mobility to/from bathroom using RW with CGA for safety and writer managed O2 tubing. Pt exhausted just using bathroom.  Copywriter, advertising - Technique: Ambulating  Equipment Used: Arboriculturist Transfers Comments: Verbal cues for safety/hand placement.  Bed mobility  Supine to Sit: Stand by assistance  Scooting: Stand by assistance  Transfers  Stand Step Transfers: Contact guard assistance  Sit to stand: Contact guard assistance  Stand to sit: Contact guard assistance  Transfer Comments: Verbal cues for safety/hand placement.  Cognition  Overall Cognitive Status: Exceptions  Arousal/Alertness: Appropriate responses to stimuli  Following Commands: Follows one step commands with  repetition  Attention Span: Appears intact  Memory: Decreased recall of recent events  Safety Judgement: Decreased awareness of need for safety  Problem Solving: Assistance required to identify errors made;Assistance required to correct errors made  Insights: Decreased awareness of deficits  Initiation: Requires cues for some  Sequencing: Requires cues for some  Cognition Comment: Pt is VERY limited due to SOB/fatigue     Perception  [facility]                                   Plan   Plan  Times per week: 4-5x/week, 1-2x/day  Specific instructions for Next Treatment: GO OVER CERVICAL HEP  Current Treatment Recommendations: Strengthening, Location managerBalance Training, Building services engineerunctional Mobility Training, Endurance Training, Psychologist, educationalCognitive Reorientation, Equipment Evaluation, Education, Sports administrator& procurement, Equities traderatient/Caregiver Education & Training, Self-Care / ADL, Art therapistCognitive/Perceptual Training, Patent attorneyafety Education & Training, Positioning                        AM-PAC Score        AM-PAC Inpatient Daily Activity Raw Score: 17 (09/24/18 0822)  AM-PAC Inpatient ADL T-Scale Score : 37.26 (09/24/18 16100822)  ADL Inpatient CMS 0-100% Score: 50.11 (09/24/18 0822)  ADL Inpatient CMS G-Code Modifier : CK (09/24/18 96040822)    Goals  Short term goals  Time Frame for Short term goals: by discharge, pt will  Short term goal 1: demo SBA with ADL transfers with good safety, AD/DME as needed  Short term goal 2: demo SBA with functional mob in room with good safety for ADL completion with good safety/pacing and approp aD  Short term goal 3: demo SBA with UB ADLs and min A LB ADLs with good safety/pacing, DME as needed  Short term goal 4: demo CGA with toileting routine with good safety  Short term goal 5: demo and verb good understanding of fall prevention techs, EC/WS techs, B UE HEP, and possible equip needs   Patient Goals   Patient goals : to go  home when able       Therapy Time   Individual Concurrent Group Co-treatment   Time In 0811          Time Out 0835         Minutes [address]                 Breyon Blass, OTA

## 2018-09-24 NOTE — Progress Notes (Addendum)
Pulmonary Critical Care Progress Note  Katherine Breeze, Katherine Cunningham / Katherine Cunningham, M.D.     Patient seen for the follow up of respiratory failure, COPD exacerbation, pulmonary nodules    Subjective:  She is resting in bed, she continues to be short of breath with activity, not quite back to baseline.  She has occasional cough.  She denies any chest pain.  Her oral intake remains fairly poor, dietitian has been consulted.    Examination:  Vitals: BP (!) 119/54    Pulse 84    Temp 97.7 ??F (36.5 ??C) (Axillary)    Resp 18    Ht 5\' 2"  (1.575 m)    Wt 81 lb 1.6 oz (36.8 kg)    SpO2 96%    BMI 14.83 kg/m??     General appearance: alert and cooperative with exam, resting in bed in no distress  Neck: No JVD  Lungs: Very poor air exchange with no wheeze  Heart: regular rate and rhythm, S1, S2 normal, no gallop  Abdomen: Soft, non tender, + BS  Extremities: no cyanosis or clubbing. No significant edema    LABs:  CBC:   Recent Labs     09/23/18  0441 09/24/18  0549   WBC 14.3* 16.1*   HGB 11.4* 11.2*   HCT 36.1* 36.1*   PLT 184 189     BMP:   Recent Labs     09/23/18  0441 09/24/18  0549   NA 141 140   K 3.8 4.0   CO2 31 31   BUN 51* 45*   CREATININE 1.07* 0.90   LABGLOM 49* 60*   GLUCOSE 127* 128*     Impression:  ?? Acute on chronic hypoxic respiratory failure  ?? Acute exacerbation of COPD/severe emphysema  ?? 75-pack-year smoking history  ?? Subcentimeter pulmonary nodules  ?? Acute combined systolic and diastolic heart failure, EF 25%  ?? Atypical chest pain  ?? AKI/hyperkalemia  ?? Protein calorie malnutrition    Recommendations:  ?? Oxygen by nasal cannula  ?? Albuterol and Ipratropium Q 4 hours and prn  ?? Symbicort 160  ?? Prednisone taper  ?? Diuresis per nephrology  ?? DVT prophylaxis with subcu heparin  ?? Incentive spirometry every hour while awake  ?? Palliative care on consult  ?? Recommend follow-up CT chest in 6 to 8 weeks as outpatient to follow-up on pulmonary nodules.  She may need biopsy in the future.  ?? Discharge planning to  rehab, awaiting pre-CERT  ?? Discussed with RN  ?? Will follow with you    Electronically signed by     Katherine Breeze, APRN - Katherine Cunningham on 09/24/2018 at 10:49 AM  Patient was seen under the supervision of Dr. Orlena Cunningham  Pulmonary Critical Care and Sleep Medicine,    [facility]: [phone]     Patient seen and evaluated by me.  She is sitting up in the bed eating her lunch.  She appears to be short of breath.  She did not use BiPAP last night.  Lung exam reveals poor air exchange, occasional wheeze.  Plan is to continue oxygen via nasal cannula, continue Symbicort, prednisone taper and bronchodilators.  Diuresis per nephrology.  Discharge planning to rehab when pre-CERT obtained.  Above was reviewed and discussed with Katherine Breeze, Katherine Cunningham. And I agree with assessment and plan of care.  Electronically signed by     Marlyce Huge, MD on 09/24/2018 at 12:52 PM  Pulmonary Critical Care and Sleep Medicine,  [facility]  Cell: [phone]  Office: [phone]

## 2018-09-24 NOTE — Progress Notes (Signed)
Patient discharged via [facility]. Report called to facility by Jae Dire, RN.   COC completed and signed per Clinical research associate and physician for [facility]   Discharge packet given to EMS to deliver to RN receiving patient with tramadol script     Telemetry monitor returned

## 2018-09-24 NOTE — Discharge Summary (Signed)
DISCHARGE SUMMARY  [facility].,    Adult Hospitalist      Patient ID: Katherine Cunningham  MRN: [id number]     Acct:  [id number]       Patient's PCP: Gwenette Greet, MD    Admit Date: 09/17/2018     Discharge Date: 09/24/2018      Admitting Physician: Phineas Inches, MD    Discharge Physician: Joanne Chars, MD     CONSULTANTS: Patient Care Team:  Gwenette Greet, MD as PCP - General (Family Medicine)    PROCEDURES PERFORMED:     Active Discharge Diagnoses:  ?? Acute combined systolic and diastolic CHF  ?? Elevated troponin  ?? Acute COPD exacerbation  ?? Acute respiratory insufficiency  ?? Right lung base pulmonary nodule  ?? Hyperkalemia  ?? Acute renal insufficiency  ?? Hypothyroidism  ?? Cardiomyopathy status post AICD  ?? History of bladder cancer  ?? Osteoarthritis multiple joints  ?? Anxiety      Primary Problem  CHF (congestive heart failure), NYHA class II, acute, combined [facility])    Hospital Course: Pt admitted w acute on chronic combined systolic and diastolic CHF. Pt also had acute exacerbation of COPD and had h/o severe emphysema. EF on echo was 25%. Pt developed acute renal insufficiency after which diuretics were adjusted. Pt c/o pain in multiple joints and was given narcotic medication cautiously. Family were concerned with the patient's narcotic medication use and wanted pain management to be involved early who could assist with other procedures to alleviate pain. Pt debated and finally decided to go to a SNF for further rehab    The plan was discussed in detail with patient who agreed with the plan and verbalized understanding .    The patient was seen and examined on day of discharge and this discharge summary is in conjunction with any daily progress note from day of discharge.    Hospital Data:    Labs:    Hematology:No results for input(s): WBC, RBC, HGB, HCT, MCV, MCH, MCHC, RDW, PLT, MPV, SEDRATE, CRP, INR, DDIMER, CD4TCELL, LABABSO in the last 72 hours.    Invalid input(s): PT  Chemistry:  Recent Labs      10/15/18  1110   NA 137   K 4.0   CL 96*   CO2 26   GLUCOSE 65*   BUN 33*   CREATININE 1.12*   ANIONGAP 15   LABGLOM 46*   GFRAA 56*   CALCIUM 8.7     No results for input(s): PROT, LABALBU, LABA1C, T3TOTAL, T4TOTAL, FT4, TSH, AST, ALT, LDH, GGT, ALKPHOS, LABGGT, BILITOT, BILIDIR, AMMONIA, AMYLASE, LIPASE, LACTATE, CHOL, HDL, LDLCHOLESTEROL, CHOLHDLRATIO, TRIG, VLDL, HIV12AB, PHENYTOIN, PHENYF, URICACID, POCGLU in the last 72 hours.  No results found for: INR, PROTIME  Lab Results   Component Value Date/Time    SPECIAL RAC 09/17/2018 09:26 AM     Lab Results   Component Value Date/Time    CULTURE NO GROWTH 6 DAYS 09/17/2018 09:26 AM       Lab Results   Component Value Date    POCPH 7.39 09/17/2018    POCPCO2 43 09/17/2018    POCPO2 81 09/17/2018    POCHCO3 25.9 09/17/2018    NBEA NOT REPORTED 09/17/2018    PBEA 1 09/17/2018    TCO2ART 27 09/17/2018    POCO2SAT 96 09/17/2018    FIO2 30.0 09/17/2018       Radiology:    No results found.      All radiological  studies reviewed      Reviews of Symptoms:    A 10 point system is reviewed and  negative except described in hospital course    Physical Exam:    Vitals:  BP (!) 111/50    Pulse 86    Temp 97.5 ??F (36.4 ??C) (Oral)    Resp 20    Ht 5\' 2"  (1.575 m)    Wt 81 lb 1.6 oz (36.8 kg)    SpO2 91%    BMI 14.83 kg/m??   No data recorded.      General appearance - alert, well appearing, and in no acute distress  Mental status - oriented to person, place, and time with normal affect  Head - normocephalic and atraumatic  Eyes - pupils equal and reactive, extraocular eye movements intact, conjunctiva clear  Ears - hearing appears to be intact  Nose - no drainage noted  Mouth - mucous membranes moist  Neck - supple, no carotid bruits, thyroid not palpable  Chest - clear to auscultation, normal effort  Heart - normal rate, regular rhythm, no murmur  Abdomen - soft, nontender, nondistended, bowel sounds present all four quadrants, no masses, hepatomegaly or  splenomegaly  Neurological - normal speech, no focal findings or movement disorder noted, cranial nerves II through XII grossly intact  Extremities - peripheral pulses palpable, no pedal edema or calf pain with palpation  Skin - no gross lesions, rashes, or induration noted      Consults:  IP CONSULT TO INTERNAL MEDICINE  IP CONSULT TO CARDIOLOGY  IP CONSULT TO PULMONOLOGY  IP CONSULT TO CARDIOLOGY  IP CONSULT TO PULMONOLOGY  IP CONSULT TO SOCIAL WORK  IP CONSULT TO NEPHROLOGY  IP CONSULT TO PALLIATIVE CARE    Disposition: SNF    Discharged Condition: Stable    Follow Up: Gwenette Greetekha Talla, MD  [address]  [place] [id number]  [phone]    In 2 weeks      Raynelle DickKesari B Sarikonda, MD  [address]  [place] [id number]  [phone]    In 6 months      Marlyce HugeFateh U Ahmed, MD  [address]  [place] [id number]  [phone]    Schedule an appointment as soon as possible for a visit in 2 weeks  Follow up CT scan in 6-8 weeks             Diet: No diet orders on file    Discharge Medications:    Fontaine NoDeeter, Lilliam   Home Medication Instructions LOV:[id number]    Printed on:10/17/18 1434   Medication Information                      albuterol sulfate HFA (PROAIR HFA) 108 (90 Base) MCG/ACT inhaler  Take 2 puffs by mouth every 4 hours as needed             budesonide-formoterol (SYMBICORT) 160-4.5 MCG/ACT AERO  Take 2 puffs by mouth 2 times daily             calcium carbonate (TUMS) 500 MG chewable tablet  Take 1 tablet by mouth 3 times daily as needed for Heartburn             famotidine (PEPCID) 20 MG tablet  Take 1 tablet by mouth daily             ferrous sulfate (IRON 325) 325 (65 Fe) MG tablet  Take 1 tablet by mouth daily  furosemide (LASIX) 40 MG tablet  Take 1 tablet by mouth daily             levothyroxine (SYNTHROID) 25 MCG tablet  Take 1 tablet by mouth daily             lidocaine (LIDODERM) 5 %  Place 1 patch onto the skin as needed             mirtazapine (REMERON) 15 MG tablet  Take 15 mg by mouth nightly               nitroGLYCERIN (NITROSTAT) 0.4 MG SL tablet  up to max of 3 total doses. If no relief after 1 dose, call 911.             pramipexole (MIRAPEX) 0.25 MG tablet  Take 1 tablet by mouth daily as needed              senna (SENOKOT) 8.6 MG tablet  Take 1 tablet by mouth 2 times daily as needed for Constipation             silver sulfADIAZINE (SILVADENE) 1 % cream  Apply 1 inch topically as needed             tiotropium (SPIRIVA RESPIMAT) 1.25 MCG/ACT AERS inhaler  Take 2 puffs by mouth daily                 Code Status:  Prior    Time Spent on discharge is  35 mins in patient examination, evaluation, counseling as well as medication reconciliation, prescriptions for required medications, discharge plan and follow up.    Electronically signed by Joanne Chars, MD on 10/17/2018 at 2:34 PM     Thank you Dr. Gwenette Greet, MD for the opportunity to be involved in this patient's care.    This note was created with the assistance of a speech-recognition program.  Although the intention is to generate a document that actually reflects the content of the visit, no guarantees can be provided that every mistake has been identified and corrected by editing.     Note was updated later by me after  physical examination and  completion of the assessment.

## 2018-09-24 NOTE — Discharge Summary (Signed)
All patient belongings collected. IV and telemetry removed. Discharge paperwork given and explained to patient. Pt d/c to [facility] at [facility] via [employer]. See discharge event for further details.

## 2018-09-24 NOTE — Progress Notes (Signed)
Reason for Consult:  Acute kidney injury.    Interval Hx:  Cr improved to baseline   Has no new complaints   dyspnea stable     HISTORY OF PRESENT ILLNESS:    The patient is a 85 y.o. female who presents with shortness of breath. She had iv contrast with CTA chest today. No previous labs available in our system for comparison.. On admission she was noted to have elevated creatinine of 1.02 with a potassium level of 5.5. Presently on BiPAP. Denies any problems with nausea, vomiting, appetite, diarrhea or difficulty with urination. Denies any recent use of NSAIDs.     Prior to Admission medications    Medication Sig Start Date End Date Taking? Authorizing Provider   amiodarone (CORDARONE) 200 MG tablet Take 1 tablet by mouth daily   Yes Historical Provider, MD   ferrous sulfate (IRON 325) 325 (65 Fe) MG tablet Take 1 tablet by mouth daily   Yes Historical Provider, MD   ipratropium-albuterol (DUONEB) 0.5-2.5 (3) MG/3ML SOLN nebulizer solution Take 1 nebule by mouth every 8 hours as needed   Yes Historical Provider, MD   levothyroxine (SYNTHROID) 25 MCG tablet Take 1 tablet by mouth daily   Yes Historical Provider, MD   lidocaine (LIDODERM) 5 % Place 1 patch onto the skin as needed 11/17/17  Yes Historical Provider, MD   mirtazapine (REMERON) 15 MG tablet Take 15 mg by mouth nightly  08/31/17  Yes Historical Provider, MD   pramipexole (MIRAPEX) 0.25 MG tablet Take 1 tablet by mouth daily as needed  12/30/17  Yes Historical Provider, MD   budesonide-formoterol (SYMBICORT) 160-4.5 MCG/ACT AERO Take 2 puffs by mouth 2 times daily   Yes Historical Provider, MD   albuterol sulfate HFA (PROAIR HFA) 108 (90 Base) MCG/ACT inhaler Take 2 puffs by mouth every 4 hours as needed   Yes Historical Provider, MD   tiotropium (SPIRIVA RESPIMAT) 1.25 MCG/ACT AERS inhaler Take 2 puffs by mouth daily   Yes Historical Provider, MD   spironolactone (ALDACTONE) 25 MG tablet Take 0.5 tablets by mouth daily   Yes Historical Provider, MD    cyclobenzaprine (FLEXERIL) 10 MG tablet Take 1 tablet by mouth 3 times daily    Historical Provider, MD   silver sulfADIAZINE (SILVADENE) 1 % cream Apply 1 inch topically as needed 01/21/18   Historical Provider, MD   acetaminophen-codeine (TYLENOL/CODEINE #3) 300-30 MG per tablet Take 1 tablet by mouth every 8 hours as needed. 11/17/17   Historical Provider, MD       Scheduled Meds:  ??? predniSONE  20 mg Oral BID   ??? lidocaine  1 patch Transdermal Daily   ??? ipratropium-albuterol  1 ampule Inhalation Q4H WA   ??? furosemide  40 mg Oral Daily   ??? sodium chloride flush  10 mL Intravenous BID   ??? sodium chloride flush  10 mL Intravenous 2 times per day   ??? amiodarone  200 mg Oral Daily   ??? cyclobenzaprine  10 mg Oral TID   ??? ferrous sulfate  325 mg Oral Daily   ??? levothyroxine  25 mcg Oral Daily   ??? mirtazapine  15 mg Oral Nightly   ??? budesonide-formoterol  2 puff Inhalation BID   ??? famotidine  20 mg Oral Daily   ??? heparin (porcine)  5,000 Units Subcutaneous BID      Physical Exam:  Vitals:    09/24/18 0258 09/24/18 0359 09/24/18 0801 09/24/18 1207   BP:  (!) 112/47 Marland Kitchen)  119/54 (!) 103/48   Pulse:  80 84 83   Resp:  18 18 16    Temp:  97.9 ??F (36.6 ??C) 97.7 ??F (36.5 ??C) 97.7 ??F (36.5 ??C)   TempSrc:  Oral Axillary Oral   SpO2:  91% 96% 92%   Weight: 81 lb 1.6 oz (36.8 kg)      Height:         I/O last 3 completed shifts:  In: 850 [P.O.:850]  Out: 1600 [Urine:1600]    General:  Awake, alert, not in distress. Appears to be stated age.  HEENT: Atraumatic, normocephalic. Anicteric sclera. Pink and moist oral mucosa. No carotid bruit. No JVD.  Chest: Bilateral air entry, clear to auscultation, no wheezing, rhonchi or rales.  Cardiovascular: RRR, S1S2, no murmur, rub or gallop. No lower extremity edema.    Abdomen: Soft, non tender to palpation.   Musculoskeletal:  No cyanosis or clubbing.  Integumentary: Pink, warm and dry. Free from rash or lesions. Skin turgor normal.  CNS: Speech clear. Face symmetrical. No tremor.      Data:  CBC:   Lab Results   Component Value Date    WBC 16.1 (H) 09/24/2018    HGB 11.2 (L) 09/24/2018    HCT 36.1 (L) 09/24/2018    MCV 101.1 09/24/2018    PLT 189 09/24/2018     BMP:    Lab Results   Component Value Date    NA 140 09/24/2018    NA 141 09/23/2018    NA 139 09/22/2018    K 4.0 09/24/2018    K 3.8 09/23/2018    K 4.2 09/22/2018    CL 97 (L) 09/24/2018    CL 97 (L) 09/23/2018    CL 95 (L) 09/22/2018    CO2 31 09/24/2018    CO2 31 09/23/2018    CO2 31 09/22/2018    BUN 45 (H) 09/24/2018    BUN 51 (H) 09/23/2018    BUN 46 (H) 09/22/2018    CREATININE 0.90 09/24/2018    CREATININE 1.07 (H) 09/23/2018    CREATININE 1.08 (H) 09/22/2018    GLUCOSE 128 (H) 09/24/2018    GLUCOSE 127 (H) 09/23/2018    GLUCOSE 132 (H) 09/22/2018     CMP:   Lab Results   Component Value Date    NA 140 09/24/2018    K 4.0 09/24/2018    CL 97 09/24/2018    CO2 31 09/24/2018    BUN 45 09/24/2018    CREATININE 0.90 09/24/2018    GLUCOSE 128 09/24/2018    CALCIUM 8.7 09/24/2018    PROT 6.9 09/18/2018    LABALBU 4.1 09/17/2018    BILITOT 0.14 09/17/2018    ALKPHOS 103 09/17/2018    AST 36 09/17/2018    ALT 22 09/17/2018      Hepatic:   Lab Results   Component Value Date    AST 36 (H) 09/17/2018    ALT 22 09/17/2018    BILITOT 0.14 (L) 09/17/2018    ALKPHOS 103 09/17/2018     BNP: No results found for: BNP  Lipids: No results found for: CHOL, HDL  INR: No results found for: INR  PTH: No results found for: PTH  Phosphorus:    Lab Results   Component Value Date    PHOS 2.8 09/24/2018     Ionized Calcium: No results found for: IONCA  Magnesium:   Lab Results   Component Value Date    MG 2.4 09/24/2018  Albumin:   Lab Results   Component Value Date    LABALBU 4.1 09/17/2018     Last 3 CK, CKMB, Troponin: @LABRCNT (CKTOTAL:3,CKMB:3,TROPONINI:3)       URINE:)No results found for: Ofilia NeasNAUR, PROTUR     Radiology:   Reviewed.    Assessment:  1. Acute kidney injury, appears to be hemodynamically related.  2. CKD stage 3.   3. Hyperkalemia,  secondary to AKI, improved.  4. Elevated calcium, with elevated vitamin D level, improved.  5. Exacerbation of COPD.    Plan:  Continue current dose of Lasix   Verify home dose   holding aldactone for now. Avoid vitamin D.  Electrolytes are okay   Calcium was corrected  Mild unilateral hydro on US, most likely chronic, no need for further work-up given that renal function showed significant improvement medical management  Renal diet with oral fluid restriction of 1000 ml/24 hours.   Avoid hypotension, nephrotoxic drugs, Lovenox, Fleets enema and IV contrast exposure.  Follow up chemistries ordered for AM.    We will continue to follow along with you.       Electronically signed by Lynne LeaderMohamad H Cataleyah Colborn, MD  on 09/24/2018 at 1:35 PM   [facility].  Ph: [phone]

## 2018-09-24 NOTE — Progress Notes (Signed)
Son, Jesusita Oka updated on patients discharge to the facility     Dr.Adil did call and speak to son as requested

## 2018-09-24 NOTE — Progress Notes (Signed)
Patients son, Reuel Boom and his wife called because they received a voicemail from Social work that the patient will be leaving this evening  Writer informed them that consults had signed off and the facility had accepted the patient, however the admitting doctor would be rounding later tonight and would have the final say on discharge.   Writer would update family either way, discharged or not.      Son and DIL are concerned about their mom being "loopyTeacher, adult education was informed that the patient does not tolerate pain medication well and often abuses it home.     Son and DIL stated that the patient has seen pain management in the past, and expected pain management to see her.     Writer explained that the patient consisently c/o of pain and her pain medications have been adjusted several times to accomodate them making her very tired. The intent was to control pain, not make her drowsy.     Son and DIL stated that the "pain medication is making her weak", would like to speak to the doctor when he rounds. They do not want the patient to go the SNF on pain medication. "The women they spoke to on the phone was loopy, not their mother. She was [employer] a week agoRetail buyer explained to the family the patient has been sleepy however is A&OX4. And again the intent was to control pain, not sedate the patient. She does c/o of pain consisently.    Son stated that he does not want his mother to be used as a bank and if she is going to go on pain meds that she might as well come home because it is the pain medications that is the problem.      Writer stated that the rehab is only for the patient to regain her strength so she is safe to go home.    Son stated that if it comes down to it, he is the medical power of attorney.     Writer stated she understands their concerns and will ask the doctor to call them when he rounds. Also, that medical power of attorney only kicks in if the patient in unable to make her  own decisions. She is A&Ox4. Son disagrees and would just like the physician to call him.

## 2018-09-24 NOTE — Plan of Care (Signed)
Problem: Falls - Risk of:  Goal: Will remain free from falls  Description: Will remain free from falls  Outcome: Ongoing     Problem: Pain:  Goal: Control of acute pain  Description: Control of acute pain  Outcome: Ongoing     Problem: Respiratory:  Goal: Respiratory status will improve  Description: Respiratory status will improve  Outcome: Ongoing     Problem: Skin Integrity:  Goal: Will show no infection signs and symptoms  Description: Will show no infection signs and symptoms  Outcome: Ongoing

## 2018-09-24 NOTE — Plan of Care (Signed)
Nutrition Problem: Severe malnutrition, In context of chronic illness  Intervention: Food and/or Nutrient Delivery: Modify current diet, Start ONS  Nutritional Goals: PO intake to meet >75% of estimated energy and protein needs

## 2018-09-24 NOTE — Progress Notes (Signed)
Dr.Hatahet wanted to know what home dose of lasix the patient takes.     Writer spoke with the med rec pharmacist about her home dose of Lasix. Pt doesn't have it listed on her current home list. However, if Dr.Hatahet wanted to see if she was ever on it, I could call her pharmacy     The dietitian was consulted per pulm for nutritional assessment.   Dietitian requested that her fluid restriction and diet be lifted from low potassium.     Writer told the dietitian no on the fluid restriction because of her CHF and EF, but would ask about the diet    Dr.Hatahet updated. He stated with her K+ of 4.0 to leave the low potassium restriction in place    Dietitian updated

## 2018-09-24 NOTE — Care Coordination-Inpatient (Signed)
SW received call from [place] with [facility] regarding the insurance pre-cert has been approved. SW updated pt RN Jae Dire regarding approval.  Jae Dire will keep SW updated.

## 2018-09-24 NOTE — Care Coordination-Inpatient (Addendum)
SW received call from [facility] regarding transportation has been arranged 8:00pm.  SW updated the facility.    SW called pt daughter in law Arline Asp to inform of discharge and transport time- [phone], no answer SW left message with details     RN to call report [phone] fax [phone]

## 2018-09-24 NOTE — Plan of Care (Signed)
Problem: Falls - Risk of:  Goal: Will remain free from falls  Description: Will remain free from falls  09/24/2018 1342 by Elinor Parkinson, RN  Outcome: Ongoing  Siderails up x 2  Hourly rounding.  Call light in reach.  Instructed to call for assist before attempting out of bed.  Remains free from falls and accidental injury at this time.   Floor free from obstacles, and bed is locked and in lowest position. Adequate lighting provided.  Bed alarm on. Fall sticker on wristband. Fall Sign posted in doorway       Problem: Falls - Risk of:  Goal: Absence of physical injury  Description: Absence of physical injury  Outcome: Ongoing     Problem: Pain:  Goal: Pain level will decrease  Description: Pain level will decrease  Outcome: Ongoing     Problem: Pain:  Goal: Control of chronic pain  Description: Control of chronic pain  Outcome: Ongoing  Patient has c/o cervical neck pain, Lidocaine patch applied Q24 hrs for 12 hours  Flexeril BID   Has Tylenol 650mg  Q6  Tramadol 50 Q6  0.5 tab Percocet 5-10 Q6     Problem: Breathing Pattern - Ineffective:  Goal: Ability to achieve and maintain a regular respiratory rate will improve  Description: Ability to achieve and maintain a regular respiratory rate will improve  Outcome: Ongoing     Problem: Respiratory:  Goal: Respiratory status will improve  Description: Respiratory status will improve  09/24/2018 1342 by Elinor Parkinson, RN  Outcome: Ongoing  Patient is on 2L NC 24/7 at home     Problem: Skin Integrity:  Goal: Will show no infection signs and symptoms  Description: Will show no infection signs and symptoms  09/24/2018 1342 by Elinor Parkinson, RN  Outcome: Ongoing  Patient is dyspneic on exertion as well as in bed      Problem: Skin Integrity:  Goal: Absence of new skin breakdown  Description: Absence of new skin breakdown  Outcome: Ongoing  Waffle mattress on bed and properly inflated   Q2 turn  Barrier cream to coccyx   Heels floated off bed

## 2018-09-24 NOTE — Care Coordination-Inpatient (Signed)
SW spoke with Inetta Fermo from [facility] regarding the insurance pre-cert is pending, Inetta Fermo requested the bi-pap settings, SW gave settings.  Pre-cert pending - SW following

## 2018-09-24 NOTE — Progress Notes (Signed)
Nutrition Assessment    Type and Reason for Visit: Initial, Consult, RD Nutrition Re-Screen    Nutrition Recommendations:   1. Suggest changing to:  General, 1,000 mL fluid restricted diet.  2. Consider ordering Magic Cup supplement x 2/day and Ensure Enlive ONS x1/day.  3. Monitor PO intake, lab results and tolerance to diet.      Nutrition Assessment: Pt severely malnourished AEB SOB, appetite not being very good, PO intake of <50% of meals, constipation, wt loss of 13.7 kg (26%) x 4 months, 74% UBW and BMI of 14.9 (underweight).  K level=4.0 (5/29).  Spoke with Jae Dire, RN:  about eliminating potassium restriction.  Suggest adjusting to general, 1,000 mL fluid restricted diet, with ordering of Magic Cup x 2/day & Ensure Enlive ONS x 1/day.  Will monitor PO intake, lab results & tolerance to diet.      Malnutrition Assessment:  ?? Malnutrition Status: Meets the criteria for severe malnutrition  ?? Context: Chronic illness  ?? Findings of the 6 clinical characteristics of malnutrition (Minimum of 2 out of 6 clinical characteristics is required to make the diagnosis of moderate or severe Protein Calorie Malnutrition based on AND/ASPEN Guidelines):  1. Energy Intake-Less than or equal to 50% of estimated energy requirement, Greater than or equal to 3 months    2. Weight Loss-20% loss or greater(26%), (in 4 months)  3. Fat Loss-Unable to assess  4. Muscle Loss-Unable to assess  5. Fluid Accumulation-No significant fluid accumulation  6. Grip Strength-Not measured    Nutrition Risk Level: High    Nutrient Needs:  ?? Estimated Daily Total Kcal: 1,350-1,500 kcal (35-39 kcal/kg)  ?? Estimated Daily Protein (g): 45-55 g (1.2-1.4 g/kg)  ?? Estimated Daily Total Fluid (ml/day): 1,000 mL fluid restriction    Nutrition Diagnosis:   ?? Problem: Severe malnutrition, In context of chronic illness  ?? Etiology: related to Impaired respiratory function-inability to consume food, Cardiac dysfunction    ??? Signs and symptoms:  as evidenced  by Intake 25-50%, Diet history of poor intake, BMI, Weight loss, Lab values    Objective Information:  ?? Nutrition-Focused Physical Findings: GI:  distended, bloating, constipation, last BM 5/28, hypoactive bowel sounds; Edema:  none; Skin:  pale, redness, buttocks, abrasions, bilateral shins  ?? Wound Type: Multiple, Skin Tears  ?? Current Nutrition Therapies:  ?? Oral Diet Orders: General, Low Potassium, Fluid Restriction   ?? Oral Diet intake: 26-50%  ?? Anthropometric Measures:  ?? Ht: 5\' 2"  (157.5 cm)   ?? Current Body Wt: 81 lb 2.1 oz (36.8 kg)  ?? Admission Body Wt: 85 lb 1.6 oz (38.6 kg)  ?? Usual Body Wt: 115 lb (52.2 kg)(05/2018)  ?? % Weight Change: ,26.1% x 4 months  ?? Ideal Body Wt: 110 lb 3.7 oz (50 kg), % Ideal Body 77%  ?? BMI Classification: BMI <18.5 Underweight    Nutrition Interventions:   Modify current diet, Start ONS  Continued Inpatient Monitoring, Discharge Planning    Nutrition Evaluation:   ?? Evaluation: Goals set   ?? Goals: PO intake to meet >75% of estimated energy and protein needs    ?? Monitoring: Meal Intake, Supplement Intake, Diet Tolerance, Skin Integrity, Wound Healing, Weight, Pertinent Labs, Constipation, Monitor Bowel Function      Electronically signed by Jake Samples, MSEd, RDN, LD on 09/24/18 at 2:56 PM EDT    Contact Number: [phone]

## 2018-09-25 MED FILL — IPRATROPIUM-ALBUTEROL 0.5-2.5 (3) MG/3ML IN SOLN: RESPIRATORY_TRACT | Qty: 3

## 2018-09-27 ENCOUNTER — Inpatient Hospital Stay: Payer: MEDICARE | Primary: Family Medicine

## 2018-09-27 DIAGNOSIS — I509 Heart failure, unspecified: Secondary | ICD-10-CM

## 2018-09-27 LAB — CBC
Hematocrit: 40.4 % (ref 36.3–47.1)
Hemoglobin: 12.6 g/dL (ref 11.9–15.1)
MCH: 31.4 pg (ref 25.2–33.5)
MCHC: 31.2 g/dL (ref 28.4–34.8)
MCV: 100.7 fL (ref 82.6–102.9)
MPV: 10.1 fL (ref 8.1–13.5)
NRBC Automated: 0 per 100 WBC
Platelets: 191 10*3/uL (ref 138–453)
RBC: 4.01 m/uL (ref 3.95–5.11)
RDW: 14.6 % — ABNORMAL HIGH (ref 11.8–14.4)
WBC: 13.2 10*3/uL — ABNORMAL HIGH (ref 3.5–11.3)

## 2018-09-27 LAB — BASIC METABOLIC PANEL
Anion Gap: 13 mmol/L (ref 9–17)
BUN: 46 mg/dL — ABNORMAL HIGH (ref 8–23)
CO2: 33 mmol/L — ABNORMAL HIGH (ref 20–31)
Calcium: 9.3 mg/dL (ref 8.6–10.4)
Chloride: 92 mmol/L — ABNORMAL LOW (ref 98–107)
Creatinine: 1.07 mg/dL — ABNORMAL HIGH (ref 0.50–0.90)
GFR African American: 59 mL/min — ABNORMAL LOW (ref >60)
GFR Non-African American: 49 mL/min — ABNORMAL LOW (ref >60)
Glucose: 129 mg/dL — ABNORMAL HIGH (ref 70–99)
Potassium: 3.5 mmol/L — ABNORMAL LOW (ref 3.7–5.3)
Sodium: 138 mmol/L (ref 135–144)

## 2018-10-08 ENCOUNTER — Inpatient Hospital Stay: Payer: MEDICARE | Primary: Family Medicine

## 2018-10-08 DIAGNOSIS — E46 Unspecified protein-calorie malnutrition: Secondary | ICD-10-CM

## 2018-10-08 LAB — BASIC METABOLIC PANEL
Anion Gap: 16 mmol/L (ref 9–17)
BUN: 33 mg/dL — ABNORMAL HIGH (ref 8–23)
CO2: 27 mmol/L (ref 20–31)
Calcium: 8.6 mg/dL (ref 8.6–10.4)
Chloride: 91 mmol/L — ABNORMAL LOW (ref 98–107)
Creatinine: 1.13 mg/dL — ABNORMAL HIGH (ref 0.50–0.90)
GFR African American: 56 mL/min — ABNORMAL LOW (ref >60)
GFR Non-African American: 46 mL/min — ABNORMAL LOW (ref >60)
Glucose: 100 mg/dL — ABNORMAL HIGH (ref 70–99)
Potassium: 4.7 mmol/L (ref 3.7–5.3)
Sodium: 134 mmol/L — ABNORMAL LOW (ref 135–144)

## 2018-10-08 LAB — CBC
Hematocrit: 30.9 % — ABNORMAL LOW (ref 36.3–47.1)
Hemoglobin: 9.9 g/dL — ABNORMAL LOW (ref 11.9–15.1)
MCH: 31.3 pg (ref 25.2–33.5)
MCHC: 32 g/dL (ref 28.4–34.8)
MCV: 97.8 fL (ref 82.6–102.9)
MPV: 9.8 fL (ref 8.1–13.5)
NRBC Automated: 0 per 100 WBC
Platelets: 210 10*3/uL (ref 138–453)
RBC: 3.16 m/uL — ABNORMAL LOW (ref 3.95–5.11)
RDW: 14 % (ref 11.8–14.4)
WBC: 10.1 10*3/uL (ref 3.5–11.3)

## 2018-10-11 ENCOUNTER — Inpatient Hospital Stay: Payer: MEDICARE | Primary: Family Medicine

## 2018-10-11 DIAGNOSIS — L0390 Cellulitis, unspecified: Secondary | ICD-10-CM

## 2018-10-11 LAB — CBC WITH AUTO DIFFERENTIAL
Absolute Eos #: 0.09 10*3/uL (ref 0.00–0.44)
Absolute Immature Granulocyte: 0.05 10*3/uL (ref 0.00–0.30)
Absolute Lymph #: 0.98 10*3/uL — ABNORMAL LOW (ref 1.10–3.70)
Absolute Mono #: 0.51 10*3/uL (ref 0.10–1.20)
Basophils Absolute: 0.04 10*3/uL (ref 0.00–0.20)
Basophils: 0 % (ref 0–2)
Eosinophils %: 1 % (ref 1–4)
Hematocrit: 34.8 % — ABNORMAL LOW (ref 36.3–47.1)
Hemoglobin: 10.8 g/dL — ABNORMAL LOW (ref 11.9–15.1)
Immature Granulocytes: 1 % — ABNORMAL HIGH
Lymphocytes: 10 % — ABNORMAL LOW (ref 24–43)
MCH: 31.3 pg (ref 25.2–33.5)
MCHC: 31 g/dL (ref 28.4–34.8)
MCV: 100.9 fL (ref 82.6–102.9)
MPV: 9.5 fL (ref 8.1–13.5)
Monocytes: 5 % (ref 3–12)
NRBC Automated: 0 per 100 WBC
Platelets: 365 10*3/uL (ref 138–453)
RBC: 3.45 m/uL — ABNORMAL LOW (ref 3.95–5.11)
RDW: 14.8 % — ABNORMAL HIGH (ref 11.8–14.4)
Seg Neutrophils: 84 % — ABNORMAL HIGH (ref 36–65)
Segs Absolute: 8.48 10*3/uL — ABNORMAL HIGH (ref 1.50–8.10)
WBC: 10.2 10*3/uL (ref 3.5–11.3)

## 2018-10-11 LAB — BASIC METABOLIC PANEL
Anion Gap: 16 mmol/L (ref 9–17)
BUN: 37 mg/dL — ABNORMAL HIGH (ref 8–23)
CO2: 25 mmol/L (ref 20–31)
Calcium: 9.1 mg/dL (ref 8.6–10.4)
Chloride: 90 mmol/L — ABNORMAL LOW (ref 98–107)
Creatinine: 1.24 mg/dL — ABNORMAL HIGH (ref 0.50–0.90)
GFR African American: 50 mL/min — ABNORMAL LOW (ref >60)
GFR Non-African American: 41 mL/min — ABNORMAL LOW (ref >60)
Glucose: 128 mg/dL — ABNORMAL HIGH (ref 70–99)
Potassium: 3.9 mmol/L (ref 3.7–5.3)
Sodium: 131 mmol/L — ABNORMAL LOW (ref 135–144)

## 2018-10-13 ENCOUNTER — Inpatient Hospital Stay: Payer: MEDICARE | Primary: Family Medicine

## 2018-10-13 DIAGNOSIS — I509 Heart failure, unspecified: Secondary | ICD-10-CM

## 2018-10-13 LAB — BASIC METABOLIC PANEL
Anion Gap: 28 mmol/L — ABNORMAL HIGH (ref 9–17)
BUN: 36 mg/dL — ABNORMAL HIGH (ref 8–23)
CO2: 19 mmol/L — ABNORMAL LOW (ref 20–31)
Calcium: 9.4 mg/dL (ref 8.6–10.4)
Chloride: 94 mmol/L — ABNORMAL LOW (ref 98–107)
Creatinine: 1.39 mg/dL — ABNORMAL HIGH (ref 0.50–0.90)
GFR African American: 44 mL/min — ABNORMAL LOW (ref >60)
GFR Non-African American: 36 mL/min — ABNORMAL LOW (ref >60)
Glucose: 98 mg/dL (ref 70–99)
Potassium: 4.2 mmol/L (ref 3.7–5.3)
Sodium: 141 mmol/L (ref 135–144)

## 2018-10-15 ENCOUNTER — Inpatient Hospital Stay: Payer: MEDICARE | Primary: Family Medicine

## 2018-10-15 DIAGNOSIS — N179 Acute kidney failure, unspecified: Secondary | ICD-10-CM

## 2018-10-15 LAB — BASIC METABOLIC PANEL
Anion Gap: 15 mmol/L (ref 9–17)
BUN: 33 mg/dL — ABNORMAL HIGH (ref 8–23)
CO2: 26 mmol/L (ref 20–31)
Calcium: 8.7 mg/dL (ref 8.6–10.4)
Chloride: 96 mmol/L — ABNORMAL LOW (ref 98–107)
Creatinine: 1.12 mg/dL — ABNORMAL HIGH (ref 0.50–0.90)
GFR African American: 56 mL/min — ABNORMAL LOW (ref >60)
GFR Non-African American: 46 mL/min — ABNORMAL LOW (ref >60)
Glucose: 65 mg/dL — ABNORMAL LOW (ref 70–99)
Potassium: 4 mmol/L (ref 3.7–5.3)
Sodium: 137 mmol/L (ref 135–144)

## 2018-10-18 ENCOUNTER — Inpatient Hospital Stay: Payer: MEDICARE | Primary: Family Medicine

## 2018-10-18 DIAGNOSIS — S2239XA Fracture of one rib, unspecified side, initial encounter for closed fracture: Secondary | ICD-10-CM

## 2018-10-18 LAB — BASIC METABOLIC PANEL
Anion Gap: 18 mmol/L — ABNORMAL HIGH (ref 9–17)
BUN: 32 mg/dL — ABNORMAL HIGH (ref 8–23)
CO2: 22 mmol/L (ref 20–31)
Calcium: 9 mg/dL (ref 8.6–10.4)
Chloride: 97 mmol/L — ABNORMAL LOW (ref 98–107)
Creatinine: 1.08 mg/dL — ABNORMAL HIGH (ref 0.50–0.90)
GFR African American: 59 mL/min — ABNORMAL LOW (ref >60)
GFR Non-African American: 48 mL/min — ABNORMAL LOW (ref >60)
Glucose: 122 mg/dL — ABNORMAL HIGH (ref 70–99)
Potassium: 4.1 mmol/L (ref 3.7–5.3)
Sodium: 137 mmol/L (ref 135–144)

## 2018-10-18 LAB — HEMOGLOBIN AND HEMATOCRIT
Hematocrit: 33.2 % — ABNORMAL LOW (ref 36.3–47.1)
Hemoglobin: 10 g/dL — ABNORMAL LOW (ref 11.9–15.1)

## 2018-10-21 ENCOUNTER — Inpatient Hospital Stay: Payer: MEDICARE | Primary: Family Medicine

## 2018-10-21 DIAGNOSIS — J449 Chronic obstructive pulmonary disease, unspecified: Secondary | ICD-10-CM

## 2018-10-21 LAB — BASIC METABOLIC PANEL
Anion Gap: 18 mmol/L — ABNORMAL HIGH (ref 9–17)
BUN: 33 mg/dL — ABNORMAL HIGH (ref 8–23)
CO2: 22 mmol/L (ref 20–31)
Calcium: 9 mg/dL (ref 8.6–10.4)
Chloride: 98 mmol/L (ref 98–107)
Creatinine: 0.89 mg/dL (ref 0.50–0.90)
GFR African American: >60 mL/min (ref >60)
GFR Non-African American: >60 mL/min (ref >60)
Glucose: 55 mg/dL — ABNORMAL LOW (ref 70–99)
Potassium: 4.6 mmol/L (ref 3.7–5.3)
Sodium: 138 mmol/L (ref 135–144)

## 2018-11-04 ENCOUNTER — Inpatient Hospital Stay: Payer: MEDICARE | Primary: Family Medicine

## 2018-11-04 DIAGNOSIS — M7989 Other specified soft tissue disorders: Secondary | ICD-10-CM

## 2018-11-04 LAB — COMPREHENSIVE METABOLIC PANEL
ALT: 20 U/L (ref 5–33)
AST: 27 U/L (ref <32)
Albumin: 3.6 g/dL (ref 3.5–5.2)
Alkaline Phosphatase: 115 U/L — ABNORMAL HIGH (ref 35–104)
Anion Gap: 12 mmol/L (ref 9–17)
BUN: 23 mg/dL (ref 8–23)
Bun/Cre Ratio: 27 — ABNORMAL HIGH (ref 9–20)
CO2: 22 mmol/L (ref 20–31)
Calcium: 8.9 mg/dL (ref 8.6–10.4)
Chloride: 104 mmol/L (ref 98–107)
Creatinine: 0.85 mg/dL (ref 0.50–0.90)
GFR African American: >60 mL/min (ref >60)
GFR Non-African American: >60 mL/min (ref >60)
Glucose: 159 mg/dL — ABNORMAL HIGH (ref 70–99)
Potassium: 4.7 mmol/L (ref 3.7–5.3)
Sodium: 138 mmol/L (ref 135–144)
Total Bilirubin: 0.12 mg/dL — ABNORMAL LOW (ref 0.3–1.2)
Total Protein: 6.4 g/dL (ref 6.4–8.3)

## 2018-11-04 LAB — CBC
Hematocrit: 30.5 % — ABNORMAL LOW (ref 36.3–47.1)
Hemoglobin: 9.6 g/dL — ABNORMAL LOW (ref 11.9–15.1)
MCH: 32.7 pg (ref 25.2–33.5)
MCHC: 31.5 g/dL (ref 28.4–34.8)
MCV: 103.7 fL — ABNORMAL HIGH (ref 82.6–102.9)
MPV: 9.4 fL (ref 8.1–13.5)
NRBC Automated: 0 per 100 WBC
Platelets: 208 10*3/uL (ref 138–453)
RBC: 2.94 m/uL — ABNORMAL LOW (ref 3.95–5.11)
RDW: 16.6 % — ABNORMAL HIGH (ref 11.8–14.4)
WBC: 9.8 10*3/uL (ref 3.5–11.3)

## 2018-11-25 ENCOUNTER — Inpatient Hospital Stay: Payer: MEDICARE | Primary: Family Medicine

## 2018-11-25 DIAGNOSIS — E039 Hypothyroidism, unspecified: Secondary | ICD-10-CM

## 2018-11-25 LAB — TSH: TSH: 139.4 mIU/L — ABNORMAL HIGH (ref 0.30–5.00)

## 2018-11-25 LAB — T4, FREE: Thyroxine, Free: 0.44 ng/dL — ABNORMAL LOW (ref 0.93–1.70)

## 2018-11-26 LAB — IRON AND TIBC
Iron Saturation: 18 % — ABNORMAL LOW (ref 20–55)
Iron: 54 ug/dL (ref 37–145)
TIBC: 293 ug/dL (ref 250–450)
UIBC: 239 ug/dL (ref 112–347)

## 2018-11-26 LAB — VITAMIN B12: Vitamin B-12: 364 pg/mL (ref 232–1245)

## 2018-11-26 LAB — FOLATE: Folate: >20 ng/mL (ref >4.8)

## 2018-11-26 LAB — FERRITIN: Ferritin: 197 ug/L — ABNORMAL HIGH (ref 13–150)

## 2018-11-28 LAB — METHYLMALONIC ACID, SERUM: Methylmalonic Acid: 0.5 umol/L — ABNORMAL HIGH (ref 0.00–0.40)

## 2019-03-08 ENCOUNTER — Inpatient Hospital Stay: Payer: MEDICARE | Primary: Family Medicine

## 2019-03-08 DIAGNOSIS — J449 Chronic obstructive pulmonary disease, unspecified: Secondary | ICD-10-CM

## 2019-03-08 LAB — COMPREHENSIVE METABOLIC PANEL
ALT: 39 U/L — ABNORMAL HIGH (ref 5–33)
AST: 28 U/L (ref <32)
Albumin/Globulin Ratio: 1.8 (ref 1.0–2.5)
Albumin: 3.7 g/dL (ref 3.5–5.2)
Alkaline Phosphatase: 69 U/L (ref 35–104)
Anion Gap: 8 mmol/L — ABNORMAL LOW (ref 9–17)
BUN: 52 mg/dL — ABNORMAL HIGH (ref 8–23)
CO2: 32 mmol/L — ABNORMAL HIGH (ref 20–31)
Calcium: 9.3 mg/dL (ref 8.6–10.4)
Chloride: 90 mmol/L — ABNORMAL LOW (ref 98–107)
Creatinine: 1.26 mg/dL — ABNORMAL HIGH (ref 0.50–0.90)
GFR African American: 49 mL/min — ABNORMAL LOW (ref >60)
GFR Non-African American: 40 mL/min — ABNORMAL LOW (ref >60)
Glucose: 128 mg/dL — ABNORMAL HIGH (ref 70–99)
Potassium: 5.4 mmol/L — ABNORMAL HIGH (ref 3.7–5.3)
Sodium: 130 mmol/L — ABNORMAL LOW (ref 135–144)
Total Bilirubin: <0.1 mg/dL — ABNORMAL LOW (ref 0.3–1.2)
Total Protein: 5.8 g/dL — ABNORMAL LOW (ref 6.4–8.3)

## 2019-03-08 LAB — CBC
Hematocrit: 35.1 % — ABNORMAL LOW (ref 36.3–47.1)
Hemoglobin: 11 g/dL — ABNORMAL LOW (ref 11.9–15.1)
MCH: 31.3 pg (ref 25.2–33.5)
MCHC: 31.3 g/dL (ref 28.4–34.8)
MCV: 100 fL (ref 82.6–102.9)
MPV: 9.5 fL (ref 8.1–13.5)
NRBC Automated: 0 per 100 WBC
Platelets: 149 10*3/uL (ref 138–453)
RBC: 3.51 m/uL — ABNORMAL LOW (ref 3.95–5.11)
RDW: 13.3 % (ref 11.8–14.4)
WBC: 13.3 10*3/uL — ABNORMAL HIGH (ref 3.5–11.3)

## 2019-03-11 ENCOUNTER — Inpatient Hospital Stay: Payer: MEDICARE | Primary: Family Medicine

## 2019-03-11 DIAGNOSIS — I4891 Unspecified atrial fibrillation: Secondary | ICD-10-CM

## 2019-03-11 LAB — CBC
Hematocrit: 33.1 % — ABNORMAL LOW (ref 36.3–47.1)
Hemoglobin: 10.3 g/dL — ABNORMAL LOW (ref 11.9–15.1)
MCH: 31 pg (ref 25.2–33.5)
MCHC: 31.1 g/dL (ref 28.4–34.8)
MCV: 99.7 fL (ref 82.6–102.9)
MPV: 9.6 fL (ref 8.1–13.5)
NRBC Automated: 0 per 100 WBC
Platelets: 151 10*3/uL (ref 138–453)
RBC: 3.32 m/uL — ABNORMAL LOW (ref 3.95–5.11)
RDW: 13.2 % (ref 11.8–14.4)
WBC: 13.2 10*3/uL — ABNORMAL HIGH (ref 3.5–11.3)

## 2019-03-11 LAB — BASIC METABOLIC PANEL
Anion Gap: 9 mmol/L (ref 9–17)
BUN: 41 mg/dL — ABNORMAL HIGH (ref 8–23)
CO2: 31 mmol/L (ref 20–31)
Calcium: 9.1 mg/dL (ref 8.6–10.4)
Chloride: 92 mmol/L — ABNORMAL LOW (ref 98–107)
Creatinine: 1.06 mg/dL — ABNORMAL HIGH (ref 0.50–0.90)
GFR African American: 60 mL/min — ABNORMAL LOW (ref >60)
GFR Non-African American: 49 mL/min — ABNORMAL LOW (ref >60)
Glucose: 60 mg/dL — ABNORMAL LOW (ref 70–99)
Potassium: 4.7 mmol/L (ref 3.7–5.3)
Sodium: 132 mmol/L — ABNORMAL LOW (ref 135–144)

## 2019-03-16 ENCOUNTER — Inpatient Hospital Stay: Payer: MEDICARE | Primary: Family Medicine

## 2019-03-16 DIAGNOSIS — I110 Hypertensive heart disease with heart failure: Secondary | ICD-10-CM

## 2019-03-16 LAB — BASIC METABOLIC PANEL
Anion Gap: 7 mmol/L — ABNORMAL LOW (ref 9–17)
BUN: 39 mg/dL — ABNORMAL HIGH (ref 8–23)
CO2: 35 mmol/L — ABNORMAL HIGH (ref 20–31)
Calcium: 9.2 mg/dL (ref 8.6–10.4)
Chloride: 98 mmol/L (ref 98–107)
Creatinine: 1.13 mg/dL — ABNORMAL HIGH (ref 0.50–0.90)
GFR African American: 56 mL/min — ABNORMAL LOW (ref >60)
GFR Non-African American: 46 mL/min — ABNORMAL LOW (ref >60)
Glucose: 94 mg/dL (ref 70–99)
Potassium: 4.6 mmol/L (ref 3.7–5.3)
Sodium: 140 mmol/L (ref 135–144)

## 2019-03-16 LAB — CBC
Hematocrit: 32.7 % — ABNORMAL LOW (ref 36.3–47.1)
Hemoglobin: 10.1 g/dL — ABNORMAL LOW (ref 11.9–15.1)
MCH: 31.7 pg (ref 25.2–33.5)
MCHC: 30.9 g/dL (ref 28.4–34.8)
MCV: 102.5 fL (ref 82.6–102.9)
MPV: 9.2 fL (ref 8.1–13.5)
NRBC Automated: 0 per 100 WBC
Platelets: 261 10*3/uL (ref 138–453)
RBC: 3.19 m/uL — ABNORMAL LOW (ref 3.95–5.11)
RDW: 14.1 % (ref 11.8–14.4)
WBC: 9.4 10*3/uL (ref 3.5–11.3)

## 2019-10-27 NOTE — Telephone Encounter (Signed)
Incoming return call from [place].  Unwilling to discuss medications.  Only stating "all set and don't need anything" then ended the call.

## 2019-10-27 NOTE — Telephone Encounter (Signed)
For Pharmacy Admin Tracking Only    ??? CPA in place:  No  ??? Recommendation Provided To: Patient/Caregiver: 1 via Telephone  ??? Intervention Detail: Adherence Monitoring: 1  ??? Gap Closed?: No   ??? Total # of Interventions Recommended: 1  ??? Total # of Interventions Accepted: 1  ??? Intervention Accepted By: Patient/Caregiver: 1  ??? Time Spent (min): 15

## 2019-10-27 NOTE — Telephone Encounter (Signed)
POPULATION HEALTH CLINICAL PHARMACY REVIEW: ADHERENCE REVIEW  Identified care gap per [place]; fills at [facility]: ACE/ARB adherence    Last Visit: unknown      ASSESSMENT  ACE/ARB ADHERENCE    Per Insurance Records through June 21  LISINOPRIL TAB 2.5MG  last filled on 05/30/19 for 90 day supply. Next refill due: 5//2/21    Aurelio Brash MD    PLAN  The following are interventions that have been identified:   - Patient overdue refilling LISINOPRIL TAB 2.5MG  and active on home medication list.     Attempting to reach patient to review.?? Left message asking for return call.      No future appointments.    Joushua Dugar CPhT.   Population Health Clinical Support Specialist  [facility] [place] [facility]  Department, toll free: [phone], option 7

## 2020-02-27 DEATH — deceased

## 2023-12-02 NOTE — Telephone Encounter (Signed)
 Err
